# Patient Record
Sex: Male | Born: 1935 | Race: White | Hispanic: No | Smoking: Former smoker
Health system: Southern US, Community
[De-identification: ages and names within clinical notes are randomized; demographics above are authoritative.]

## PROBLEM LIST (undated history)

## (undated) DIAGNOSIS — I350 Nonrheumatic aortic (valve) stenosis: Secondary | ICD-10-CM

## (undated) DIAGNOSIS — L570 Actinic keratosis: Secondary | ICD-10-CM

## (undated) DIAGNOSIS — E785 Hyperlipidemia, unspecified: Secondary | ICD-10-CM

## (undated) DIAGNOSIS — K648 Other hemorrhoids: Secondary | ICD-10-CM

## (undated) DIAGNOSIS — I10 Essential (primary) hypertension: Secondary | ICD-10-CM

## (undated) DIAGNOSIS — I452 Bifascicular block: Secondary | ICD-10-CM

## (undated) DIAGNOSIS — R972 Elevated prostate specific antigen [PSA]: Secondary | ICD-10-CM

## (undated) DIAGNOSIS — N433 Hydrocele, unspecified: Secondary | ICD-10-CM

## (undated) DIAGNOSIS — R011 Cardiac murmur, unspecified: Secondary | ICD-10-CM

## (undated) DIAGNOSIS — H547 Unspecified visual loss: Secondary | ICD-10-CM

## (undated) DIAGNOSIS — Z87442 Personal history of urinary calculi: Secondary | ICD-10-CM

## (undated) DIAGNOSIS — N200 Calculus of kidney: Secondary | ICD-10-CM

## (undated) DIAGNOSIS — J3800 Paralysis of vocal cords and larynx, unspecified: Secondary | ICD-10-CM

## (undated) DIAGNOSIS — Z953 Presence of xenogenic heart valve: Secondary | ICD-10-CM

## (undated) DIAGNOSIS — I4819 Other persistent atrial fibrillation: Secondary | ICD-10-CM

## (undated) DIAGNOSIS — I2510 Atherosclerotic heart disease of native coronary artery without angina pectoris: Secondary | ICD-10-CM

## (undated) DIAGNOSIS — H3530 Unspecified macular degeneration: Secondary | ICD-10-CM

## (undated) DIAGNOSIS — M47812 Spondylosis without myelopathy or radiculopathy, cervical region: Secondary | ICD-10-CM

## (undated) DIAGNOSIS — I340 Nonrheumatic mitral (valve) insufficiency: Secondary | ICD-10-CM

## (undated) DIAGNOSIS — K219 Gastro-esophageal reflux disease without esophagitis: Secondary | ICD-10-CM

## (undated) DIAGNOSIS — Z9889 Other specified postprocedural states: Secondary | ICD-10-CM

## (undated) DIAGNOSIS — N183 Chronic kidney disease, stage 3 unspecified: Secondary | ICD-10-CM

## (undated) DIAGNOSIS — Z8719 Personal history of other diseases of the digestive system: Secondary | ICD-10-CM

## (undated) HISTORY — DX: Calculus of kidney: N20.0

## (undated) HISTORY — DX: Other specified postprocedural states: Z98.890

## (undated) HISTORY — DX: Spondylosis without myelopathy or radiculopathy, cervical region: M47.812

## (undated) HISTORY — PX: EYE SURGERY: SHX253

## (undated) HISTORY — DX: Hydrocele, unspecified: N43.3

## (undated) HISTORY — DX: Other persistent atrial fibrillation: I48.19

## (undated) HISTORY — DX: Nonrheumatic aortic (valve) stenosis: I35.0

## (undated) HISTORY — PX: HEMORRHOID SURGERY: SHX153

## (undated) HISTORY — DX: Cardiac murmur, unspecified: R01.1

## (undated) HISTORY — DX: Other hemorrhoids: K64.8

## (undated) HISTORY — DX: Chronic kidney disease, stage 3 unspecified: N18.30

## (undated) HISTORY — DX: Unspecified macular degeneration: H35.30

## (undated) HISTORY — DX: Hyperlipidemia, unspecified: E78.5

## (undated) HISTORY — DX: Bifascicular block: I45.2

## (undated) HISTORY — DX: Unspecified visual loss: H54.7

## (undated) HISTORY — DX: Elevated prostate specific antigen (PSA): R97.20

## (undated) HISTORY — DX: Actinic keratosis: L57.0

## (undated) HISTORY — DX: Essential (primary) hypertension: I10

## (undated) HISTORY — DX: Nonrheumatic mitral (valve) insufficiency: I34.0

## (undated) HISTORY — DX: Paralysis of vocal cords and larynx, unspecified: J38.00

---

## 1998-04-22 ENCOUNTER — Ambulatory Visit: Admission: RE | Admit: 1998-04-22 | Discharge: 1998-04-22 | Payer: Self-pay | Admitting: *Deleted

## 2003-11-22 ENCOUNTER — Ambulatory Visit (HOSPITAL_COMMUNITY): Admission: RE | Admit: 2003-11-22 | Discharge: 2003-11-22 | Payer: Self-pay | Admitting: Family Medicine

## 2004-04-28 ENCOUNTER — Emergency Department (HOSPITAL_COMMUNITY): Admission: EM | Admit: 2004-04-28 | Discharge: 2004-04-28 | Payer: Self-pay | Admitting: Emergency Medicine

## 2004-07-01 ENCOUNTER — Ambulatory Visit (HOSPITAL_COMMUNITY): Admission: RE | Admit: 2004-07-01 | Discharge: 2004-07-01 | Payer: Self-pay | Admitting: *Deleted

## 2004-09-15 DIAGNOSIS — Z9889 Other specified postprocedural states: Secondary | ICD-10-CM

## 2004-09-15 HISTORY — DX: Other specified postprocedural states: Z98.890

## 2005-08-06 ENCOUNTER — Ambulatory Visit (HOSPITAL_COMMUNITY): Admission: RE | Admit: 2005-08-06 | Discharge: 2005-08-06 | Payer: Self-pay | Admitting: Internal Medicine

## 2009-07-11 ENCOUNTER — Ambulatory Visit (HOSPITAL_COMMUNITY): Admission: RE | Admit: 2009-07-11 | Discharge: 2009-07-12 | Payer: Self-pay | Admitting: Ophthalmology

## 2009-10-12 HISTORY — PX: CATARACT EXTRACTION: SUR2

## 2009-11-24 ENCOUNTER — Emergency Department (HOSPITAL_COMMUNITY): Admission: EM | Admit: 2009-11-24 | Discharge: 2009-11-24 | Payer: Self-pay | Admitting: Emergency Medicine

## 2010-10-12 HISTORY — PX: GROIN MASS OPEN BIOPSY: SHX1714

## 2011-01-15 LAB — BASIC METABOLIC PANEL
BUN: 10 mg/dL (ref 6–23)
CO2: 28 mEq/L (ref 19–32)
Calcium: 9.3 mg/dL (ref 8.4–10.5)
Chloride: 102 mEq/L (ref 96–112)
Creatinine, Ser: 0.9 mg/dL (ref 0.4–1.5)
GFR calc Af Amer: >60 mL/min (ref >60)
GFR calc non Af Amer: >60 mL/min (ref >60)
Glucose, Bld: 110 mg/dL — ABNORMAL HIGH (ref 70–99)
Potassium: 4 mEq/L (ref 3.5–5.1)
Sodium: 139 mEq/L (ref 135–145)

## 2011-01-16 LAB — EYE CULTURE

## 2011-01-16 LAB — CBC
HCT: 44 % (ref 39.0–52.0)
Hemoglobin: 15 g/dL (ref 13.0–17.0)
MCHC: 34.1 g/dL (ref 30.0–36.0)
MCV: 90.6 fL (ref 78.0–100.0)
Platelets: 219 10*3/uL (ref 150–400)
RBC: 4.86 MIL/uL (ref 4.22–5.81)
RDW: 12.8 % (ref 11.5–15.5)
WBC: 11.2 10*3/uL — ABNORMAL HIGH (ref 4.0–10.5)

## 2011-01-16 LAB — BASIC METABOLIC PANEL
BUN: 10 mg/dL (ref 6–23)
CO2: 31 mEq/L (ref 19–32)
Calcium: 10 mg/dL (ref 8.4–10.5)
Chloride: 104 mEq/L (ref 96–112)
Creatinine, Ser: 0.93 mg/dL (ref 0.4–1.5)
GFR calc Af Amer: >60 mL/min (ref >60)
GFR calc non Af Amer: >60 mL/min (ref >60)
Glucose, Bld: 103 mg/dL — ABNORMAL HIGH (ref 70–99)
Potassium: 4.7 mEq/L (ref 3.5–5.1)
Sodium: 142 mEq/L (ref 135–145)

## 2011-01-16 LAB — GRAM STAIN

## 2011-02-24 ENCOUNTER — Inpatient Hospital Stay (HOSPITAL_COMMUNITY)
Admission: EM | Admit: 2011-02-24 | Discharge: 2011-02-25 | DRG: 287 | Disposition: A | Discharge status: Home / Self Care | Payer: Medicare Other | Attending: Interventional Cardiology | Admitting: Interventional Cardiology

## 2011-02-24 ENCOUNTER — Emergency Department (HOSPITAL_COMMUNITY): Payer: Medicare Other

## 2011-02-24 DIAGNOSIS — I10 Essential (primary) hypertension: Secondary | ICD-10-CM | POA: Diagnosis present

## 2011-02-24 DIAGNOSIS — Z79899 Other long term (current) drug therapy: Secondary | ICD-10-CM

## 2011-02-24 DIAGNOSIS — I2510 Atherosclerotic heart disease of native coronary artery without angina pectoris: Principal | ICD-10-CM | POA: Diagnosis present

## 2011-02-24 DIAGNOSIS — I059 Rheumatic mitral valve disease, unspecified: Secondary | ICD-10-CM | POA: Diagnosis present

## 2011-02-24 DIAGNOSIS — I200 Unstable angina: Secondary | ICD-10-CM | POA: Diagnosis present

## 2011-02-24 DIAGNOSIS — E785 Hyperlipidemia, unspecified: Secondary | ICD-10-CM | POA: Diagnosis present

## 2011-02-24 LAB — DIFFERENTIAL
Basophils Absolute: 0 10*3/uL (ref 0.0–0.1)
Basophils Relative: 0 % (ref 0–1)
Eosinophils Absolute: 0 10*3/uL (ref 0.0–0.7)
Eosinophils Relative: 0 % (ref 0–5)
Lymphocytes Relative: 15 % (ref 12–46)
Lymphs Abs: 1.5 10*3/uL (ref 0.7–4.0)
Monocytes Absolute: 0.6 10*3/uL (ref 0.1–1.0)
Monocytes Relative: 6 % (ref 3–12)
Neutro Abs: 7.7 10*3/uL (ref 1.7–7.7)
Neutrophils Relative %: 78 % — ABNORMAL HIGH (ref 43–77)

## 2011-02-24 LAB — URINALYSIS, ROUTINE W REFLEX MICROSCOPIC
Bilirubin Urine: NEGATIVE
Glucose, UA: NEGATIVE mg/dL
Hgb urine dipstick: NEGATIVE
Ketones, ur: NEGATIVE mg/dL
Nitrite: NEGATIVE
Protein, ur: NEGATIVE mg/dL
Specific Gravity, Urine: 1.01 (ref 1.005–1.030)
Urobilinogen, UA: 0.2 mg/dL (ref 0.0–1.0)
pH: 6 (ref 5.0–8.0)

## 2011-02-24 LAB — D-DIMER, QUANTITATIVE: D-Dimer, Quant: 0.29 ug/mL-FEU (ref 0.00–0.48)

## 2011-02-24 LAB — APTT: aPTT: 29 seconds (ref 24–37)

## 2011-02-24 LAB — CBC
HCT: 40.9 % (ref 39.0–52.0)
Hemoglobin: 13.6 g/dL (ref 13.0–17.0)
MCH: 29.4 pg (ref 26.0–34.0)
MCHC: 33.3 g/dL (ref 30.0–36.0)
MCV: 88.5 fL (ref 78.0–100.0)
Platelets: 207 10*3/uL (ref 150–400)
RBC: 4.62 MIL/uL (ref 4.22–5.81)
RDW: 13.2 % (ref 11.5–15.5)
WBC: 9.8 10*3/uL (ref 4.0–10.5)

## 2011-02-24 LAB — BASIC METABOLIC PANEL
BUN: 18 mg/dL (ref 6–23)
CO2: 26 mEq/L (ref 19–32)
Calcium: 10.2 mg/dL (ref 8.4–10.5)
Chloride: 104 mEq/L (ref 96–112)
Creatinine, Ser: 1.32 mg/dL (ref 0.4–1.5)
GFR calc Af Amer: >60 mL/min (ref >60)
GFR calc non Af Amer: 53 mL/min — ABNORMAL LOW (ref >60)
Glucose, Bld: 83 mg/dL (ref 70–99)
Potassium: 5.9 mEq/L — ABNORMAL HIGH (ref 3.5–5.1)
Sodium: 138 mEq/L (ref 135–145)

## 2011-02-24 LAB — PROTIME-INR
INR: 1 (ref 0.00–1.49)
Prothrombin Time: 13.4 seconds (ref 11.6–15.2)

## 2011-02-24 LAB — CK TOTAL AND CKMB (NOT AT ARMC)
CK, MB: 6.4 ng/mL (ref 0.3–4.0)
Relative Index: 3.7 — ABNORMAL HIGH (ref 0.0–2.5)
Total CK: 174 U/L (ref 7–232)

## 2011-02-24 LAB — TROPONIN I: Troponin I: <0.3 ng/mL (ref <0.30)

## 2011-02-24 LAB — POTASSIUM: Potassium: 4.4 mEq/L (ref 3.5–5.1)

## 2011-02-24 LAB — PRO B NATRIURETIC PEPTIDE: Pro B Natriuretic peptide (BNP): 3344 pg/mL — ABNORMAL HIGH (ref 0–450)

## 2011-02-25 DIAGNOSIS — I340 Nonrheumatic mitral (valve) insufficiency: Secondary | ICD-10-CM

## 2011-02-25 HISTORY — DX: Nonrheumatic mitral (valve) insufficiency: I34.0

## 2011-02-25 LAB — BASIC METABOLIC PANEL
BUN: 19 mg/dL (ref 6–23)
CO2: 29 mEq/L (ref 19–32)
Calcium: 9.6 mg/dL (ref 8.4–10.5)
Chloride: 105 mEq/L (ref 96–112)
Creatinine, Ser: 1.26 mg/dL (ref 0.4–1.5)
GFR calc Af Amer: >60 mL/min (ref >60)
GFR calc non Af Amer: 56 mL/min — ABNORMAL LOW (ref >60)
Glucose, Bld: 112 mg/dL — ABNORMAL HIGH (ref 70–99)
Potassium: 4.1 mEq/L (ref 3.5–5.1)
Sodium: 141 mEq/L (ref 135–145)

## 2011-02-25 LAB — CARDIAC PANEL(CRET KIN+CKTOT+MB+TROPI)
CK, MB: 5.7 ng/mL — ABNORMAL HIGH (ref 0.3–4.0)
Relative Index: 5 — ABNORMAL HIGH (ref 0.0–2.5)
Total CK: 115 U/L (ref 7–232)

## 2011-02-27 NOTE — Cardiovascular Report (Signed)
NAMEJAHMIR, SALO                ACCOUNT NO.:  [id number]   MEDICAL RECORD NO.:  [id number]          PATIENT TYPE:  OIB   LOCATION:  2899                         FACILITY:  [facility]   PHYSICIAN:  Meade Maw, M.D.    DATE OF BIRTH:  [birth year]   DATE OF PROCEDURE:  07/01/2004  DATE OF DISCHARGE:                              CARDIAC CATHETERIZATION   INDICATION FOR PROCEDURE:  Evaluation of mitral regurgitation.   PROCEDURE:  After obtaining written informed consent, the patient was  brought to the cardiac catheterization lab in a postabsorptive state.  Preop  sedation was achieved using IV Versed.  The right groin was prepped and  draped in the usual sterile fashion.  Local anesthesia was achieved using 1%  Xylocaine.  A 6 French hemostasis sheath was placed into the right femoral  artery using a modified Seldinger technique.  An 8 [place] hemostasis sheath  was placed into the right femoral vein using a modified Seldinger technique.  Right heart catheterization pressures were obtained.  Thermodilution on  cardiac outputs were obtained.  Selective coronary angiography was performed  using a JL-4, JR-4 Judkins catheter.  Single plane ventriculogram was  performed in the RAO position using a 6 French pigtail curved catheter.  O2  saturations were obtained from the SVC, the RA and pulmonary artery.   FINDINGS:  1.  Aortic pressure was 118/70.  2.  LV pressure 123/5.  3.  There was no gradient noted on pullback.  4.  The RA pressure was 90.  RV pressure was 38/3.  The pulmonary wedge      pressure mean was 13.  The thermodilution on cardiac output was 5.70.      The thermodilution on cardiac index was 2.92.  Cardiac output by Fick      was 4.30.  The cardiac index by Fick of 2.21.  The O2 saturation from      the right atrium was 70%.  The O2 saturation from the right ventricle      was 70%.  The aortic saturation was 97%.  5.  Single plane ventriculogram revealed the left ventricle  which was at      upper limits of normal.  The estimated ejection fraction was 65%.  The      was mitral regurgitation.  There was moderately severe mitral      regurgitation with left atrial opacity at approximately the third beat.      There was marked left atrial enlargement.   CORONARY ANGIOGRAPHY:  1.  The left main coronary artery bifurcates into the left anterior      descending and circumflex vessel.  There was mild calcification in the      left main and proximal LAD.  There was no significant disease in the      left main coronary artery.  2.  LAD:  Left anterior descending gives rise to two large diagonals and      goes on in as an apical recurrent branch.  There is no significant      disease in the left anterior descending or  its branches.  3.  Circumflex vessel.  The circumflex vessel is a moderate size vessel      giving size to moderate OM-1, OM-2, ending as an AV groove vessel.      There is no disease noted in the circumflex or its branches.  4.  Right coronary artery:  Right coronary artery is dominant for the      posterior circulation.  There is luminal irregularities noted in the      proximal right coronary artery.  There was a brief run of paroxysmal      atrial fibrillation persisting for approximately 15 seconds during the      procedure.   FINAL IMPRESSION:  1.  Moderately severe mitral regurgitation with marked left atrial      enlargement.  2.  Short run of paroxysmal atrial fibrillation.  3.  Normal pulmonary artery pressure.  4.  Luminal irregularities in the right coronary artery only.   RECOMMENDATIONS:  The films will be reviewed by Dr. Dorris Fetch.  At this  time, may continue to monitor the mitral regurgitation.       HP/MEDQ  D:  07/01/2004  T:  07/01/2004  Job:  [id number]   cc:   Holley Bouche, M.D.  [address]  [place], [place] [address]  Fax: [phone]

## 2011-03-04 NOTE — Discharge Summary (Signed)
  [name], [name]                ACCOUNT NO.:  [id number]  MEDICAL RECORD NO.:  [id number]           PATIENT TYPE:  I  LOCATION:  6522                         FACILITY:  [facility]  PHYSICIAN:  Corky Crafts, MDDATE OF BIRTH:  Apr 09, 1932  DATE OF ADMISSION:  02/24/2011 DATE OF DISCHARGE:  02/25/2011                              DISCHARGE SUMMARY   FINAL DIAGNOSES: 1. Unstable angina, resolved. 2. Mitral regurgitation, likely causing elevated BNP. 3. Hyperlipidemia. 4. Hypertension.  PROCEDURES PERFORMED:  Cardiac catheterization showing no significant coronary artery disease.  There was a 20-mm gradient across the aortic valve on pull back.  There is moderate mitral regurgitation.  HOSPITAL COURSE:  The patient was admitted after having unstable angina symptoms over the past few months.  He underwent cardiac catheterization, showing no significant coronary artery disease.  The findings are as outlined above.  He tolerated the procedure well.  He ruled out for MI.  His blood pressure was stable and overall he felt well.  His BNP was elevated, but he did not have significant shortness of breath.  He did not require oxygen.  DISCHARGE MEDICATIONS: 1. Aspirin 325 mg daily. 2. Lipitor 20 mg daily. 3. Lisinopril 20 mg daily. 4. PreserVision. 5. Several other herbal medicines as well.  FOLLOWUP APPOINTMENTS:  With Corky Crafts, MD on Mar 02, 2011, at 3:15 p.m.  DIET:  Low-sodium, heart-healthy diet.  ACTIVITY:  Increase activity slowly.  Follow up post-radial cath instructions.  No golf until he sees me.  We will likely start Lasix at the time of his next visit.     Corky Crafts, MD    JSV/MEDQ  D:  02/25/2011  T:  02/26/2011  Job:  [id number]  Electronically Signed by Lance Muss MD on 03/04/2011 05:08:23 PM

## 2011-03-17 NOTE — Cardiovascular Report (Signed)
Jason [name], Jason [name]                ACCOUNT NO.:  [id number]  MEDICAL RECORD NO.:  [id number]           PATIENT TYPE:  I  LOCATION:  6522                         FACILITY:  [facility]  PHYSICIAN:  Corky Crafts, MDDATE OF BIRTH:  1931-11-30  DATE OF PROCEDURE:  02/25/2011 DATE OF DISCHARGE:  02/25/2011                           CARDIAC CATHETERIZATION   PROCEDURES PERFORMED: 1. Left heart catheterization. 2. Left ventriculogram. 3. Coronary angiogram.  OPERATOR:  Corky Crafts, MD  INDICATION:  Unstable angina.  PROCEDURE NOTE:  The risks and benefits of cardiac cath were explained to the patient and informed consent was obtained.  He was brought to the cath lab.  He was prepped and draped in usual sterile fashion.  His right wrist was infiltrated with 1% lidocaine.  A 5-French glide sheath was placed into right radial artery using modified Seldinger technique. Right coronary artery angiography was performed using JR-4 pigtail catheter.  The catheter was advanced in to the vessel ostium under fluoroscopic guidance.  Digital angiography was performed in multiple projections using hand injection of contrast.  Left coronary artery angiography was performed using JL-3.5 catheter in similar fashion and pigtail catheter was advanced to the ascending aorta and across the aortic valve under fluoroscopic guidance.  Power injection of contrast was performed in the RAO projection to image the left ventricle. Catheter was pulled back under continuous hemodynamic pressure monitoring.  The sheath was removed and a TR band was placed for hemostasis.  FINDINGS:  The right coronary artery is a large dominant vessel.  There is mild-to-moderate diffuse disease.  Posterolateral artery and posterior descending arteries were small, but both patent.  There is an early bifurcation in the mid RCA. The left main is widely patent. The left circumflex is a large vessel.  The OM-1 is large  vessel and appears widely patent.  There is a mild calcification noted throughout the left circumflex. The left anterior descending is a large vessel which wraps around the apex.  There are mild luminal irregularities.  The first diagonal is a medium vessel with an ostial 50% stenosis.  Second diagonal is a large vessel.  There are mild calcifications throughout the LAD system. The left ventriculogram shows normal left ventricular function with ejection fraction of 60%.  There is 3+ mitral regurgitation.  Of note, there was some difficulty in crossing the aortic valve.  HEMODYNAMICS:  Left ventricular pressure 138/13 with LVEDP of 20 mmHg. Aortic pressure 120/76 with a mean aortic pressure of 94 mmHg, proximally 20-mm gradient on pullback, an AL-1 catheter with a straight wire was used to cross the aortic valve.  IMPRESSION: 1. Mild coronary artery disease. 2. Mild-to-moderate aortic valve gradient. 3. A 3+ mitral regurgitation. 4. Mildly increased left ventricular end-diastolic pressure.  RECOMMENDATIONS:  Continue medical therapy.  We will follow his mild aortic stenosis by echo.  We will also diurese, he has had a few days after the cath given his increased BNP.  We will likely discharge the patient later today.     Corky Crafts, MD     JSV/MEDQ  D:  02/25/2011  T:  02/26/2011  Job:  [id number]  cc:   Jillyn Hidden A. Rankin, M.D. [employer]  Electronically Signed by Lance Muss MD on 03/17/2011 10:11:51 AM

## 2011-07-27 ENCOUNTER — Encounter (INDEPENDENT_AMBULATORY_CARE_PROVIDER_SITE_OTHER): Payer: Self-pay | Admitting: Surgery

## 2011-07-27 ENCOUNTER — Ambulatory Visit (INDEPENDENT_AMBULATORY_CARE_PROVIDER_SITE_OTHER): Payer: Medicare Other | Admitting: Surgery

## 2011-07-27 VITALS — BP 129/78 | HR 79 | Temp 98.6°F | Resp 14 | Ht 69.0 in | Wt 172.8 lb

## 2011-07-27 DIAGNOSIS — R1909 Other intra-abdominal and pelvic swelling, mass and lump: Secondary | ICD-10-CM

## 2011-07-27 DIAGNOSIS — R1900 Intra-abdominal and pelvic swelling, mass and lump, unspecified site: Secondary | ICD-10-CM

## 2011-07-27 NOTE — Progress Notes (Signed)
Chief Complaint  Patient presents with  . Hernia    Jason [name] is a 77 y.o. male.   Jason This gentleman is referred by Dr. Holley Bouche for evaluation of a left groin mass and possible hernia. He reports he noticed it approximately 3 weeks ago. It is nontender. He has no pain. He denies any obstructive symptoms. He is otherwise without complaints. Past Medical History  Diagnosis Date  . Hyperlipidemia   . Hypertension   . Heart murmur     History reviewed. No pertinent past surgical history.  Family History  Problem Relation Age of Onset  . Cancer Father     Social History History  Substance Use Topics  . Smoking status: Never Smoker   . Smokeless tobacco: Not on file  . Alcohol Use: No    Allergies  Allergen Reactions  . Penicillins Rash    Current Outpatient Prescriptions  Medication Sig Dispense Refill  . atorvastatin (LIPITOR) 20 MG tablet       . CARTIA XT 240 MG 24 hr capsule       . doxycycline (VIBRAMYCIN) 50 MG capsule       . furosemide (LASIX) 40 MG tablet       . lisinopril (PRINIVIL,ZESTRIL) 20 MG tablet Take 20 mg by mouth daily.        Marland Kitchen warfarin (COUMADIN) 5 MG tablet         Review of Systems Review of Systems  Constitutional: Negative.   HENT: Negative.   Eyes: Negative.   Respiratory: Negative.   Cardiovascular: Negative.   Gastrointestinal: Negative.   Genitourinary: Positive for scrotal swelling.  Musculoskeletal: Negative.   Neurological: Negative.   Hematological: Negative.   Psychiatric/Behavioral: Negative.     Blood pressure 129/78, pulse 79, temperature 98.6 F (37 C), temperature source Temporal, resp. rate 14, height 5\' 9"  (1.753 m), weight 172 lb 12.8 oz (78.382 kg).  Physical Exam Physical Exam  Constitutional: He is oriented to person, place, and time. He appears well-developed and well-nourished. No distress.  HENT:  Head: Normocephalic and atraumatic.  Right Ear: External ear normal.  Left Ear: External  ear normal.  Nose: Nose normal.  Mouth/Throat: Oropharynx is clear and moist. No oropharyngeal exudate.  Eyes: Conjunctivae are normal. Pupils are equal, round, and reactive to light. Left eye exhibits no discharge. No scleral icterus.  Neck: Normal range of motion. Neck supple. No tracheal deviation present. No thyromegaly present.  Cardiovascular: Normal rate.  An irregular rhythm present.  Murmur heard. Pulmonary/Chest: Effort normal and breath sounds normal. No respiratory distress. He has no wheezes. He has no rales.  Abdominal: Soft. Bowel sounds are normal. He exhibits no distension. There is no tenderness. There is no rebound.  Musculoskeletal: Normal range of motion.  Lymphadenopathy:    He has no cervical adenopathy.  Neurological: He is alert and oriented to person, place, and time.  Skin: Skin is warm and dry. No rash noted. No erythema.  Psychiatric: He has a normal mood and affect. His behavior is normal. Judgment and thought content normal.   On examination of his groins, there is a 2-3 cm soft mobile mass in the left inguinal area. It is not reducible. I do not feel a specific left inguinal hernia or femoral hernia. There is no evidence of right inguinal hernia. The patient has testicles distended bilaterally. There is a right-sided hydrocele Data Reviewed   Assessment    Patient with a left groin mass of uncertain etiology.  I suspect this is either lymphadenopathy or a lipoma. I doubt hernia but I cannot totally ruled out.    Plan    At this point we discussed conservative management a CAT scan of the pelvis to a better elicit the etiology first groin exploration. After a long discussion we decided proceed with groin exploration. I discussed the risks of surgery with him including the risk of bleeding, infection, injury to surrounding structures, nerve entrapment, chronic pain, use of mesh should hernia be present, et [name]. He will stop his Coumadin 5 days prior to  surgery. The chance of success is reasonable with the surgery       Joslynn Jamroz A 07/27/2011, 2:49 PM

## 2011-08-10 ENCOUNTER — Encounter (HOSPITAL_COMMUNITY)
Admission: RE | Admit: 2011-08-10 | Discharge: 2011-08-10 | Disposition: A | Discharge status: Home / Self Care | Payer: Medicare Other | Source: Ambulatory Visit | Attending: Surgery | Admitting: Surgery

## 2011-08-10 ENCOUNTER — Ambulatory Visit (HOSPITAL_COMMUNITY)
Admission: RE | Admit: 2011-08-10 | Discharge: 2011-08-10 | Disposition: A | Discharge status: Home / Self Care | Payer: Medicare Other | Source: Ambulatory Visit | Attending: Surgery | Admitting: Surgery

## 2011-08-10 ENCOUNTER — Other Ambulatory Visit (INDEPENDENT_AMBULATORY_CARE_PROVIDER_SITE_OTHER): Payer: Self-pay | Admitting: Surgery

## 2011-08-10 DIAGNOSIS — Z01818 Encounter for other preprocedural examination: Secondary | ICD-10-CM | POA: Insufficient documentation

## 2011-08-10 DIAGNOSIS — R1909 Other intra-abdominal and pelvic swelling, mass and lump: Secondary | ICD-10-CM

## 2011-08-10 DIAGNOSIS — Z01812 Encounter for preprocedural laboratory examination: Secondary | ICD-10-CM | POA: Insufficient documentation

## 2011-08-10 LAB — BASIC METABOLIC PANEL
BUN: 13 mg/dL (ref 6–23)
CO2: 30 mEq/L (ref 19–32)
Calcium: 10.3 mg/dL (ref 8.4–10.5)
Chloride: 104 mEq/L (ref 96–112)
Creatinine, Ser: 1.05 mg/dL (ref 0.50–1.35)
GFR calc Af Amer: 78 mL/min — ABNORMAL LOW (ref >90)
GFR calc non Af Amer: 67 mL/min — ABNORMAL LOW (ref >90)
Glucose, Bld: 89 mg/dL (ref 70–99)
Potassium: 4.7 mEq/L (ref 3.5–5.1)
Sodium: 141 mEq/L (ref 135–145)

## 2011-08-10 LAB — PROTIME-INR
INR: 1.92 — ABNORMAL HIGH (ref 0.00–1.49)
Prothrombin Time: 22.3 seconds — ABNORMAL HIGH (ref 11.6–15.2)

## 2011-08-10 LAB — APTT: aPTT: 33 seconds (ref 24–37)

## 2011-08-10 LAB — CBC
HCT: 43 % (ref 39.0–52.0)
Hemoglobin: 14.6 g/dL (ref 13.0–17.0)
MCH: 29.9 pg (ref 26.0–34.0)
MCHC: 34 g/dL (ref 30.0–36.0)
MCV: 87.9 fL (ref 78.0–100.0)
Platelets: 215 10*3/uL (ref 150–400)
RBC: 4.89 MIL/uL (ref 4.22–5.81)
RDW: 13.6 % (ref 11.5–15.5)
WBC: 6.6 10*3/uL (ref 4.0–10.5)

## 2011-08-10 LAB — SURGICAL PCR SCREEN
MRSA, PCR: NEGATIVE
Staphylococcus aureus: NEGATIVE

## 2011-08-13 HISTORY — PX: INGUINAL HERNIA REPAIR: SUR1180

## 2011-08-14 ENCOUNTER — Ambulatory Visit (HOSPITAL_COMMUNITY)
Admission: RE | Admit: 2011-08-14 | Discharge: 2011-08-14 | Disposition: A | Discharge status: Home / Self Care | Payer: Medicare Other | Source: Ambulatory Visit | Attending: Surgery | Admitting: Surgery

## 2011-08-14 ENCOUNTER — Other Ambulatory Visit (INDEPENDENT_AMBULATORY_CARE_PROVIDER_SITE_OTHER): Payer: Self-pay | Admitting: Surgery

## 2011-08-14 DIAGNOSIS — Z01818 Encounter for other preprocedural examination: Secondary | ICD-10-CM | POA: Insufficient documentation

## 2011-08-14 DIAGNOSIS — K4190 Unilateral femoral hernia, without obstruction or gangrene, not specified as recurrent: Secondary | ICD-10-CM

## 2011-08-14 DIAGNOSIS — Z01812 Encounter for preprocedural laboratory examination: Secondary | ICD-10-CM | POA: Insufficient documentation

## 2011-08-14 LAB — PROTIME-INR
INR: 1.1 (ref 0.00–1.49)
Prothrombin Time: 14.4 seconds (ref 11.6–15.2)

## 2011-08-19 NOTE — Op Note (Signed)
  [name], Jason [name]                ACCOUNT NO.:  [id number]  MEDICAL RECORD NO.:  [id number]  LOCATION:  SDSC                         FACILITY:  [facility]  PHYSICIAN:  Abigail Miyamoto, M.D. DATE OF BIRTH:  [birth year]  DATE OF PROCEDURE:  08/14/2011 DATE OF DISCHARGE:                              OPERATIVE REPORT   PREOPERATIVE DIAGNOSIS:  Left groin mass.  POSTOPERATIVE DIAGNOSIS:  Left femoral hernia.  PROCEDURE:  Left groin exploration with left femoral hernia repair with mesh.  SURGEONS:  Abigail Miyamoto, MD  ANESTHESIA:  General and 0.5% Marcaine.  ESTIMATED BLOOD LOSS:  Minimal.  INDICATIONS:  This is a 77 year old gentleman who presents with a soft palpable nontender mass in his left groin.  Decision was made to proceed with exploration of the groin.  FINDINGS:  The patient was found to have a left femoral hernia.  The defect was quite small.  There was a large sac containing a small amount of omentum and fluid.  This was repaired with a Bard, Prolene plug.  PROCEDURE IN DETAIL:  The patient was brought to the operating room identified as Jason [name].  He was placed supine on the operating room table and general anesthesia was induced.  His left abdomen and groin were then prepped and draped in usual sterile fashion.  I made a small longitudinal incision in the left groin just below the inguinal ligament over the palpable mass.  I took this down through subcutaneous tissue with electrocautery.  The mass was then identified circumferentially and found to be consistent with a femoral hernia.  I excised the sac, which was quite large and thickened.  It was found to contain fluid and a small amount of omentum.  This omentum was easily reduced back to the hernia defect into the abdominal cavity.  I then excised the redundant sac and sent to pathology for evaluation.  The actual inguinal floor itself appeared to be intact and the small fascial defect at the  femoral hernia was approximately 6-7 mm in size.  I brought a small Bard, Prolene plug onto the field and placed it into the fascial defect.  I then secured it in place with several interrupted 2-0 Vicryl sutures. Again, good coverage of the femoral hernia defect appeared to be achieved.  At this point, I anesthetized the area circumferentially with Marcaine.  I then closed the subcutaneous tissue with interrupted 3-0 Vicryl sutures and closed the skin with running 4-0 Monocryl.  Steri-Strips, gauze, and tape were then applied.  The patient tolerated the procedure well.  All counts were correct at the end of the procedure.  The patient was then extubated in the operating room and taken in stable condition to recovery room.     Abigail Miyamoto, M.D.     DB/MEDQ  D:  08/14/2011  T:  08/14/2011  Job:  [id number]  Electronically Signed by Abigail Miyamoto M.D. on 08/19/2011 09:47:41 AM

## 2011-08-24 ENCOUNTER — Encounter (INDEPENDENT_AMBULATORY_CARE_PROVIDER_SITE_OTHER): Payer: Self-pay | Admitting: Surgery

## 2011-09-07 ENCOUNTER — Ambulatory Visit (INDEPENDENT_AMBULATORY_CARE_PROVIDER_SITE_OTHER): Payer: Medicare Other | Admitting: Surgery

## 2011-09-07 ENCOUNTER — Encounter (INDEPENDENT_AMBULATORY_CARE_PROVIDER_SITE_OTHER): Payer: Self-pay | Admitting: Surgery

## 2011-09-07 VITALS — BP 118/82 | HR 68 | Temp 97.6°F | Resp 16 | Ht 69.0 in | Wt 172.6 lb

## 2011-09-07 DIAGNOSIS — K4190 Unilateral femoral hernia, without obstruction or gangrene, not specified as recurrent: Secondary | ICD-10-CM | POA: Insufficient documentation

## 2011-09-07 DIAGNOSIS — Z09 Encounter for follow-up examination after completed treatment for conditions other than malignant neoplasm: Secondary | ICD-10-CM

## 2011-09-07 NOTE — Progress Notes (Signed)
Subjective:     Patient ID: Jason [name], male   DOB: 1932-04-09, 77 y.o.   MRN: [id number]  HPI He is here for his first postoperative visit status post left femoral hernia repair with mesh. Originally this is all be a mass that was found at the time of surgery to be a hernia. He is doing well and has no complaints.  Review of Systems     Objective:   Physical Exam On exam, his incision is healing well with no evidence of recurrent    Assessment:     Patient status post left femoral hernia repair with mesh    Plan:     He may now returned to normal activity. I will see him back as needed

## 2011-09-16 HISTORY — PX: MITRAL VALVE REPAIR: SHX2039

## 2011-10-26 DIAGNOSIS — Z7901 Long term (current) use of anticoagulants: Secondary | ICD-10-CM | POA: Diagnosis not present

## 2011-10-26 DIAGNOSIS — I4891 Unspecified atrial fibrillation: Secondary | ICD-10-CM | POA: Diagnosis not present

## 2011-11-05 DIAGNOSIS — H35059 Retinal neovascularization, unspecified, unspecified eye: Secondary | ICD-10-CM | POA: Diagnosis not present

## 2011-11-05 DIAGNOSIS — H353290 Exudative age-related macular degeneration, unspecified eye, stage unspecified: Secondary | ICD-10-CM | POA: Diagnosis not present

## 2011-11-23 DIAGNOSIS — Z7901 Long term (current) use of anticoagulants: Secondary | ICD-10-CM | POA: Diagnosis not present

## 2011-11-23 DIAGNOSIS — I4891 Unspecified atrial fibrillation: Secondary | ICD-10-CM | POA: Diagnosis not present

## 2011-12-07 DIAGNOSIS — I4891 Unspecified atrial fibrillation: Secondary | ICD-10-CM | POA: Diagnosis not present

## 2011-12-07 DIAGNOSIS — Z7901 Long term (current) use of anticoagulants: Secondary | ICD-10-CM | POA: Diagnosis not present

## 2011-12-09 DIAGNOSIS — H353290 Exudative age-related macular degeneration, unspecified eye, stage unspecified: Secondary | ICD-10-CM | POA: Diagnosis not present

## 2011-12-09 DIAGNOSIS — H35059 Retinal neovascularization, unspecified, unspecified eye: Secondary | ICD-10-CM | POA: Diagnosis not present

## 2011-12-28 DIAGNOSIS — H35359 Cystoid macular degeneration, unspecified eye: Secondary | ICD-10-CM | POA: Diagnosis not present

## 2011-12-28 DIAGNOSIS — H3560 Retinal hemorrhage, unspecified eye: Secondary | ICD-10-CM | POA: Diagnosis not present

## 2011-12-28 DIAGNOSIS — H353290 Exudative age-related macular degeneration, unspecified eye, stage unspecified: Secondary | ICD-10-CM | POA: Diagnosis not present

## 2011-12-28 DIAGNOSIS — H35059 Retinal neovascularization, unspecified, unspecified eye: Secondary | ICD-10-CM | POA: Diagnosis not present

## 2011-12-30 DIAGNOSIS — H353290 Exudative age-related macular degeneration, unspecified eye, stage unspecified: Secondary | ICD-10-CM | POA: Diagnosis not present

## 2011-12-30 DIAGNOSIS — H35059 Retinal neovascularization, unspecified, unspecified eye: Secondary | ICD-10-CM | POA: Diagnosis not present

## 2012-01-04 DIAGNOSIS — I4891 Unspecified atrial fibrillation: Secondary | ICD-10-CM | POA: Diagnosis not present

## 2012-01-04 DIAGNOSIS — Z7901 Long term (current) use of anticoagulants: Secondary | ICD-10-CM | POA: Diagnosis not present

## 2012-01-13 DIAGNOSIS — H353290 Exudative age-related macular degeneration, unspecified eye, stage unspecified: Secondary | ICD-10-CM | POA: Diagnosis not present

## 2012-01-13 DIAGNOSIS — H35059 Retinal neovascularization, unspecified, unspecified eye: Secondary | ICD-10-CM | POA: Diagnosis not present

## 2012-02-01 DIAGNOSIS — I4891 Unspecified atrial fibrillation: Secondary | ICD-10-CM | POA: Diagnosis not present

## 2012-02-01 DIAGNOSIS — I059 Rheumatic mitral valve disease, unspecified: Secondary | ICD-10-CM | POA: Diagnosis not present

## 2012-02-01 DIAGNOSIS — Z7901 Long term (current) use of anticoagulants: Secondary | ICD-10-CM | POA: Diagnosis not present

## 2012-02-10 DIAGNOSIS — I059 Rheumatic mitral valve disease, unspecified: Secondary | ICD-10-CM | POA: Diagnosis not present

## 2012-02-15 DIAGNOSIS — I10 Essential (primary) hypertension: Secondary | ICD-10-CM | POA: Diagnosis not present

## 2012-02-15 DIAGNOSIS — I4891 Unspecified atrial fibrillation: Secondary | ICD-10-CM | POA: Diagnosis not present

## 2012-02-15 DIAGNOSIS — E785 Hyperlipidemia, unspecified: Secondary | ICD-10-CM | POA: Diagnosis not present

## 2012-02-15 DIAGNOSIS — M1990 Unspecified osteoarthritis, unspecified site: Secondary | ICD-10-CM | POA: Diagnosis not present

## 2012-02-17 DIAGNOSIS — H35359 Cystoid macular degeneration, unspecified eye: Secondary | ICD-10-CM | POA: Diagnosis not present

## 2012-02-17 DIAGNOSIS — H35059 Retinal neovascularization, unspecified, unspecified eye: Secondary | ICD-10-CM | POA: Diagnosis not present

## 2012-02-17 DIAGNOSIS — H353290 Exudative age-related macular degeneration, unspecified eye, stage unspecified: Secondary | ICD-10-CM | POA: Diagnosis not present

## 2012-03-21 DIAGNOSIS — I4891 Unspecified atrial fibrillation: Secondary | ICD-10-CM | POA: Diagnosis not present

## 2012-03-21 DIAGNOSIS — Z7901 Long term (current) use of anticoagulants: Secondary | ICD-10-CM | POA: Diagnosis not present

## 2012-03-24 DIAGNOSIS — H35059 Retinal neovascularization, unspecified, unspecified eye: Secondary | ICD-10-CM | POA: Diagnosis not present

## 2012-03-24 DIAGNOSIS — H353290 Exudative age-related macular degeneration, unspecified eye, stage unspecified: Secondary | ICD-10-CM | POA: Diagnosis not present

## 2012-04-07 ENCOUNTER — Other Ambulatory Visit: Payer: Self-pay | Admitting: Dermatology

## 2012-04-07 DIAGNOSIS — C44721 Squamous cell carcinoma of skin of unspecified lower limb, including hip: Secondary | ICD-10-CM | POA: Diagnosis not present

## 2012-04-07 DIAGNOSIS — C44711 Basal cell carcinoma of skin of unspecified lower limb, including hip: Secondary | ICD-10-CM | POA: Diagnosis not present

## 2012-04-07 DIAGNOSIS — C44611 Basal cell carcinoma of skin of unspecified upper limb, including shoulder: Secondary | ICD-10-CM | POA: Diagnosis not present

## 2012-04-07 DIAGNOSIS — D235 Other benign neoplasm of skin of trunk: Secondary | ICD-10-CM | POA: Diagnosis not present

## 2012-04-07 DIAGNOSIS — L819 Disorder of pigmentation, unspecified: Secondary | ICD-10-CM | POA: Diagnosis not present

## 2012-04-27 DIAGNOSIS — H353290 Exudative age-related macular degeneration, unspecified eye, stage unspecified: Secondary | ICD-10-CM | POA: Diagnosis not present

## 2012-04-27 DIAGNOSIS — H35059 Retinal neovascularization, unspecified, unspecified eye: Secondary | ICD-10-CM | POA: Diagnosis not present

## 2012-05-09 DIAGNOSIS — I4891 Unspecified atrial fibrillation: Secondary | ICD-10-CM | POA: Diagnosis not present

## 2012-05-09 DIAGNOSIS — Z7901 Long term (current) use of anticoagulants: Secondary | ICD-10-CM | POA: Diagnosis not present

## 2012-05-23 DIAGNOSIS — Z7901 Long term (current) use of anticoagulants: Secondary | ICD-10-CM | POA: Diagnosis not present

## 2012-05-23 DIAGNOSIS — I4891 Unspecified atrial fibrillation: Secondary | ICD-10-CM | POA: Diagnosis not present

## 2012-05-25 ENCOUNTER — Other Ambulatory Visit: Payer: Self-pay | Admitting: Dermatology

## 2012-05-25 DIAGNOSIS — C44621 Squamous cell carcinoma of skin of unspecified upper limb, including shoulder: Secondary | ICD-10-CM | POA: Diagnosis not present

## 2012-05-25 DIAGNOSIS — Z85828 Personal history of other malignant neoplasm of skin: Secondary | ICD-10-CM | POA: Diagnosis not present

## 2012-06-01 DIAGNOSIS — H353290 Exudative age-related macular degeneration, unspecified eye, stage unspecified: Secondary | ICD-10-CM | POA: Diagnosis not present

## 2012-06-01 DIAGNOSIS — H35059 Retinal neovascularization, unspecified, unspecified eye: Secondary | ICD-10-CM | POA: Diagnosis not present

## 2012-06-20 DIAGNOSIS — Z7901 Long term (current) use of anticoagulants: Secondary | ICD-10-CM | POA: Diagnosis not present

## 2012-06-20 DIAGNOSIS — I4891 Unspecified atrial fibrillation: Secondary | ICD-10-CM | POA: Diagnosis not present

## 2012-07-06 DIAGNOSIS — H35059 Retinal neovascularization, unspecified, unspecified eye: Secondary | ICD-10-CM | POA: Diagnosis not present

## 2012-07-06 DIAGNOSIS — H353290 Exudative age-related macular degeneration, unspecified eye, stage unspecified: Secondary | ICD-10-CM | POA: Diagnosis not present

## 2012-07-18 DIAGNOSIS — Z7901 Long term (current) use of anticoagulants: Secondary | ICD-10-CM | POA: Diagnosis not present

## 2012-07-18 DIAGNOSIS — I4891 Unspecified atrial fibrillation: Secondary | ICD-10-CM | POA: Diagnosis not present

## 2012-07-20 DIAGNOSIS — N433 Hydrocele, unspecified: Secondary | ICD-10-CM | POA: Diagnosis not present

## 2012-07-20 DIAGNOSIS — N401 Enlarged prostate with lower urinary tract symptoms: Secondary | ICD-10-CM | POA: Diagnosis not present

## 2012-08-10 DIAGNOSIS — H353290 Exudative age-related macular degeneration, unspecified eye, stage unspecified: Secondary | ICD-10-CM | POA: Diagnosis not present

## 2012-08-24 DIAGNOSIS — L570 Actinic keratosis: Secondary | ICD-10-CM | POA: Diagnosis not present

## 2012-08-24 DIAGNOSIS — Z85828 Personal history of other malignant neoplasm of skin: Secondary | ICD-10-CM | POA: Diagnosis not present

## 2012-08-25 DIAGNOSIS — Z0000 Encounter for general adult medical examination without abnormal findings: Secondary | ICD-10-CM | POA: Diagnosis not present

## 2012-08-25 DIAGNOSIS — I10 Essential (primary) hypertension: Secondary | ICD-10-CM | POA: Diagnosis not present

## 2012-08-25 DIAGNOSIS — Z23 Encounter for immunization: Secondary | ICD-10-CM | POA: Diagnosis not present

## 2012-08-25 DIAGNOSIS — M1990 Unspecified osteoarthritis, unspecified site: Secondary | ICD-10-CM | POA: Diagnosis not present

## 2012-08-25 DIAGNOSIS — E785 Hyperlipidemia, unspecified: Secondary | ICD-10-CM | POA: Diagnosis not present

## 2012-08-25 DIAGNOSIS — M25519 Pain in unspecified shoulder: Secondary | ICD-10-CM | POA: Diagnosis not present

## 2012-08-29 DIAGNOSIS — Z7901 Long term (current) use of anticoagulants: Secondary | ICD-10-CM | POA: Diagnosis not present

## 2012-08-29 DIAGNOSIS — I4891 Unspecified atrial fibrillation: Secondary | ICD-10-CM | POA: Diagnosis not present

## 2012-09-14 DIAGNOSIS — H353290 Exudative age-related macular degeneration, unspecified eye, stage unspecified: Secondary | ICD-10-CM | POA: Diagnosis not present

## 2012-10-10 DIAGNOSIS — Z7901 Long term (current) use of anticoagulants: Secondary | ICD-10-CM | POA: Diagnosis not present

## 2012-10-10 DIAGNOSIS — I4891 Unspecified atrial fibrillation: Secondary | ICD-10-CM | POA: Diagnosis not present

## 2012-10-19 DIAGNOSIS — H35059 Retinal neovascularization, unspecified, unspecified eye: Secondary | ICD-10-CM | POA: Diagnosis not present

## 2012-10-19 DIAGNOSIS — H353290 Exudative age-related macular degeneration, unspecified eye, stage unspecified: Secondary | ICD-10-CM | POA: Diagnosis not present

## 2012-10-26 DIAGNOSIS — H353290 Exudative age-related macular degeneration, unspecified eye, stage unspecified: Secondary | ICD-10-CM | POA: Diagnosis not present

## 2012-11-21 DIAGNOSIS — Z7901 Long term (current) use of anticoagulants: Secondary | ICD-10-CM | POA: Diagnosis not present

## 2012-11-21 DIAGNOSIS — I4891 Unspecified atrial fibrillation: Secondary | ICD-10-CM | POA: Diagnosis not present

## 2012-11-30 DIAGNOSIS — H35059 Retinal neovascularization, unspecified, unspecified eye: Secondary | ICD-10-CM | POA: Diagnosis not present

## 2012-11-30 DIAGNOSIS — H353290 Exudative age-related macular degeneration, unspecified eye, stage unspecified: Secondary | ICD-10-CM | POA: Diagnosis not present

## 2012-12-05 DIAGNOSIS — M25519 Pain in unspecified shoulder: Secondary | ICD-10-CM | POA: Diagnosis not present

## 2012-12-05 DIAGNOSIS — R0602 Shortness of breath: Secondary | ICD-10-CM | POA: Diagnosis not present

## 2012-12-07 DIAGNOSIS — I359 Nonrheumatic aortic valve disorder, unspecified: Secondary | ICD-10-CM | POA: Diagnosis not present

## 2012-12-07 DIAGNOSIS — I4891 Unspecified atrial fibrillation: Secondary | ICD-10-CM | POA: Diagnosis not present

## 2012-12-07 DIAGNOSIS — I059 Rheumatic mitral valve disease, unspecified: Secondary | ICD-10-CM | POA: Diagnosis not present

## 2012-12-07 DIAGNOSIS — R0602 Shortness of breath: Secondary | ICD-10-CM | POA: Diagnosis not present

## 2012-12-22 DIAGNOSIS — M25819 Other specified joint disorders, unspecified shoulder: Secondary | ICD-10-CM | POA: Diagnosis not present

## 2013-01-03 ENCOUNTER — Ambulatory Visit: Payer: Medicare Other | Attending: Specialist | Admitting: Physical Therapy

## 2013-01-03 DIAGNOSIS — M25619 Stiffness of unspecified shoulder, not elsewhere classified: Secondary | ICD-10-CM | POA: Diagnosis not present

## 2013-01-03 DIAGNOSIS — R293 Abnormal posture: Secondary | ICD-10-CM | POA: Diagnosis not present

## 2013-01-03 DIAGNOSIS — M25519 Pain in unspecified shoulder: Secondary | ICD-10-CM | POA: Diagnosis not present

## 2013-01-03 DIAGNOSIS — IMO0001 Reserved for inherently not codable concepts without codable children: Secondary | ICD-10-CM | POA: Insufficient documentation

## 2013-01-04 DIAGNOSIS — H35059 Retinal neovascularization, unspecified, unspecified eye: Secondary | ICD-10-CM | POA: Diagnosis not present

## 2013-01-04 DIAGNOSIS — I4891 Unspecified atrial fibrillation: Secondary | ICD-10-CM | POA: Diagnosis not present

## 2013-01-04 DIAGNOSIS — H353290 Exudative age-related macular degeneration, unspecified eye, stage unspecified: Secondary | ICD-10-CM | POA: Diagnosis not present

## 2013-01-04 DIAGNOSIS — I359 Nonrheumatic aortic valve disorder, unspecified: Secondary | ICD-10-CM | POA: Diagnosis not present

## 2013-01-04 DIAGNOSIS — R0602 Shortness of breath: Secondary | ICD-10-CM | POA: Diagnosis not present

## 2013-01-04 DIAGNOSIS — Z7901 Long term (current) use of anticoagulants: Secondary | ICD-10-CM | POA: Diagnosis not present

## 2013-01-04 DIAGNOSIS — I059 Rheumatic mitral valve disease, unspecified: Secondary | ICD-10-CM | POA: Diagnosis not present

## 2013-01-12 ENCOUNTER — Ambulatory Visit: Payer: Medicare Other | Attending: Specialist | Admitting: Physical Therapy

## 2013-01-12 DIAGNOSIS — R293 Abnormal posture: Secondary | ICD-10-CM | POA: Insufficient documentation

## 2013-01-12 DIAGNOSIS — M25519 Pain in unspecified shoulder: Secondary | ICD-10-CM | POA: Insufficient documentation

## 2013-01-12 DIAGNOSIS — IMO0001 Reserved for inherently not codable concepts without codable children: Secondary | ICD-10-CM | POA: Diagnosis not present

## 2013-01-12 DIAGNOSIS — M25619 Stiffness of unspecified shoulder, not elsewhere classified: Secondary | ICD-10-CM | POA: Insufficient documentation

## 2013-01-19 ENCOUNTER — Ambulatory Visit: Payer: Medicare Other | Admitting: Physical Therapy

## 2013-01-19 DIAGNOSIS — R293 Abnormal posture: Secondary | ICD-10-CM | POA: Diagnosis not present

## 2013-01-19 DIAGNOSIS — M25519 Pain in unspecified shoulder: Secondary | ICD-10-CM | POA: Diagnosis not present

## 2013-01-19 DIAGNOSIS — IMO0001 Reserved for inherently not codable concepts without codable children: Secondary | ICD-10-CM | POA: Diagnosis not present

## 2013-01-19 DIAGNOSIS — M25619 Stiffness of unspecified shoulder, not elsewhere classified: Secondary | ICD-10-CM | POA: Diagnosis not present

## 2013-01-30 ENCOUNTER — Ambulatory Visit: Payer: Medicare Other | Admitting: Physical Therapy

## 2013-01-30 DIAGNOSIS — M25619 Stiffness of unspecified shoulder, not elsewhere classified: Secondary | ICD-10-CM | POA: Diagnosis not present

## 2013-01-30 DIAGNOSIS — R293 Abnormal posture: Secondary | ICD-10-CM | POA: Diagnosis not present

## 2013-01-30 DIAGNOSIS — M25519 Pain in unspecified shoulder: Secondary | ICD-10-CM | POA: Diagnosis not present

## 2013-01-30 DIAGNOSIS — IMO0001 Reserved for inherently not codable concepts without codable children: Secondary | ICD-10-CM | POA: Diagnosis not present

## 2013-02-15 DIAGNOSIS — I4891 Unspecified atrial fibrillation: Secondary | ICD-10-CM | POA: Diagnosis not present

## 2013-02-15 DIAGNOSIS — H353290 Exudative age-related macular degeneration, unspecified eye, stage unspecified: Secondary | ICD-10-CM | POA: Diagnosis not present

## 2013-02-15 DIAGNOSIS — H35059 Retinal neovascularization, unspecified, unspecified eye: Secondary | ICD-10-CM | POA: Diagnosis not present

## 2013-02-15 DIAGNOSIS — Z7901 Long term (current) use of anticoagulants: Secondary | ICD-10-CM | POA: Diagnosis not present

## 2013-02-27 DIAGNOSIS — H8110 Benign paroxysmal vertigo, unspecified ear: Secondary | ICD-10-CM | POA: Diagnosis not present

## 2013-03-13 ENCOUNTER — Ambulatory Visit: Payer: Medicare Other | Attending: Specialist | Admitting: Physical Therapy

## 2013-03-13 DIAGNOSIS — M25619 Stiffness of unspecified shoulder, not elsewhere classified: Secondary | ICD-10-CM | POA: Diagnosis not present

## 2013-03-13 DIAGNOSIS — R293 Abnormal posture: Secondary | ICD-10-CM | POA: Diagnosis not present

## 2013-03-13 DIAGNOSIS — IMO0001 Reserved for inherently not codable concepts without codable children: Secondary | ICD-10-CM | POA: Insufficient documentation

## 2013-03-13 DIAGNOSIS — M25519 Pain in unspecified shoulder: Secondary | ICD-10-CM | POA: Diagnosis not present

## 2013-03-22 DIAGNOSIS — H353290 Exudative age-related macular degeneration, unspecified eye, stage unspecified: Secondary | ICD-10-CM | POA: Diagnosis not present

## 2013-03-22 DIAGNOSIS — H35059 Retinal neovascularization, unspecified, unspecified eye: Secondary | ICD-10-CM | POA: Diagnosis not present

## 2013-03-29 DIAGNOSIS — I4891 Unspecified atrial fibrillation: Secondary | ICD-10-CM | POA: Diagnosis not present

## 2013-03-29 DIAGNOSIS — H6980 Other specified disorders of Eustachian tube, unspecified ear: Secondary | ICD-10-CM | POA: Diagnosis not present

## 2013-03-29 DIAGNOSIS — Z7901 Long term (current) use of anticoagulants: Secondary | ICD-10-CM | POA: Diagnosis not present

## 2013-03-29 DIAGNOSIS — E785 Hyperlipidemia, unspecified: Secondary | ICD-10-CM | POA: Diagnosis not present

## 2013-03-29 DIAGNOSIS — I10 Essential (primary) hypertension: Secondary | ICD-10-CM | POA: Diagnosis not present

## 2013-04-26 DIAGNOSIS — H353290 Exudative age-related macular degeneration, unspecified eye, stage unspecified: Secondary | ICD-10-CM | POA: Diagnosis not present

## 2013-04-26 DIAGNOSIS — H35359 Cystoid macular degeneration, unspecified eye: Secondary | ICD-10-CM | POA: Diagnosis not present

## 2013-05-11 DIAGNOSIS — I059 Rheumatic mitral valve disease, unspecified: Secondary | ICD-10-CM | POA: Diagnosis not present

## 2013-05-11 DIAGNOSIS — Z7901 Long term (current) use of anticoagulants: Secondary | ICD-10-CM | POA: Diagnosis not present

## 2013-05-11 DIAGNOSIS — R0602 Shortness of breath: Secondary | ICD-10-CM | POA: Diagnosis not present

## 2013-05-11 DIAGNOSIS — I4891 Unspecified atrial fibrillation: Secondary | ICD-10-CM | POA: Diagnosis not present

## 2013-05-11 DIAGNOSIS — I359 Nonrheumatic aortic valve disorder, unspecified: Secondary | ICD-10-CM | POA: Diagnosis not present

## 2013-05-29 DIAGNOSIS — R0602 Shortness of breath: Secondary | ICD-10-CM | POA: Diagnosis not present

## 2013-05-29 DIAGNOSIS — Z7901 Long term (current) use of anticoagulants: Secondary | ICD-10-CM | POA: Diagnosis not present

## 2013-05-29 DIAGNOSIS — I359 Nonrheumatic aortic valve disorder, unspecified: Secondary | ICD-10-CM | POA: Diagnosis not present

## 2013-05-29 DIAGNOSIS — I350 Nonrheumatic aortic (valve) stenosis: Secondary | ICD-10-CM | POA: Insufficient documentation

## 2013-05-29 DIAGNOSIS — I059 Rheumatic mitral valve disease, unspecified: Secondary | ICD-10-CM | POA: Diagnosis not present

## 2013-05-29 DIAGNOSIS — I4891 Unspecified atrial fibrillation: Secondary | ICD-10-CM | POA: Diagnosis not present

## 2013-05-30 DIAGNOSIS — D235 Other benign neoplasm of skin of trunk: Secondary | ICD-10-CM | POA: Diagnosis not present

## 2013-05-30 DIAGNOSIS — L570 Actinic keratosis: Secondary | ICD-10-CM | POA: Diagnosis not present

## 2013-05-30 DIAGNOSIS — D485 Neoplasm of uncertain behavior of skin: Secondary | ICD-10-CM | POA: Diagnosis not present

## 2013-05-31 DIAGNOSIS — H35359 Cystoid macular degeneration, unspecified eye: Secondary | ICD-10-CM | POA: Diagnosis not present

## 2013-05-31 DIAGNOSIS — H35059 Retinal neovascularization, unspecified, unspecified eye: Secondary | ICD-10-CM | POA: Diagnosis not present

## 2013-05-31 DIAGNOSIS — H353290 Exudative age-related macular degeneration, unspecified eye, stage unspecified: Secondary | ICD-10-CM | POA: Diagnosis not present

## 2013-06-08 ENCOUNTER — Telehealth: Payer: Self-pay | Admitting: *Deleted

## 2013-06-26 ENCOUNTER — Other Ambulatory Visit: Payer: Self-pay | Admitting: *Deleted

## 2013-06-26 ENCOUNTER — Institutional Professional Consult (permissible substitution) (INDEPENDENT_AMBULATORY_CARE_PROVIDER_SITE_OTHER): Payer: Medicare Other | Admitting: Thoracic Surgery (Cardiothoracic Vascular Surgery)

## 2013-06-26 VITALS — BP 133/92 | HR 81 | Resp 18 | Ht 67.0 in | Wt 169.0 lb

## 2013-06-26 DIAGNOSIS — Z9889 Other specified postprocedural states: Secondary | ICD-10-CM

## 2013-06-26 DIAGNOSIS — I059 Rheumatic mitral valve disease, unspecified: Secondary | ICD-10-CM

## 2013-06-26 DIAGNOSIS — I4891 Unspecified atrial fibrillation: Secondary | ICD-10-CM

## 2013-06-26 DIAGNOSIS — I4819 Other persistent atrial fibrillation: Secondary | ICD-10-CM | POA: Insufficient documentation

## 2013-06-26 DIAGNOSIS — K4190 Unilateral femoral hernia, without obstruction or gangrene, not specified as recurrent: Secondary | ICD-10-CM

## 2013-06-26 DIAGNOSIS — I350 Nonrheumatic aortic (valve) stenosis: Secondary | ICD-10-CM

## 2013-06-26 DIAGNOSIS — I071 Rheumatic tricuspid insufficiency: Secondary | ICD-10-CM

## 2013-06-26 DIAGNOSIS — I079 Rheumatic tricuspid valve disease, unspecified: Secondary | ICD-10-CM

## 2013-06-26 DIAGNOSIS — I340 Nonrheumatic mitral (valve) insufficiency: Secondary | ICD-10-CM

## 2013-06-26 DIAGNOSIS — I359 Nonrheumatic aortic valve disorder, unspecified: Secondary | ICD-10-CM | POA: Diagnosis not present

## 2013-06-26 NOTE — H&P (Signed)
[address]       [name] [address]             [phone]     CARDIOTHORACIC SURGERY CONSULTATION REPORT  Referring Provider is Everette Rank, MD PCP is Johny Blamer, MD  Chief Complaint  Patient presents with  . Aortic Stenosis    Surgical eval for possible TAVR, ECHO 05/29/2013    HPI:  Patient is a 79 year old retired white male from [place] who underwent minimally invasive mitral valve repair in 2005 by Dr. Silvestre Mesi at [facility].  The patient was reportedly left with some residual mitral regurgitation but has done very well clinically until recently. He developed atrial fibrillation several years ago and has been treated with pharmacologic rate control and anticoagulation with Coumadin. Cardiac catheterization performed in 2012 is notable for the absence of significant coronary artery disease but reportedly did demonstrate moderate mitral regurgitation and moderate aortic stenosis.  The pullback gradient across the aortic valve measured at catheterization was estimated 20 mm mercury at that time.  The patient was recently seen in followup by Dr. Eldridge Dace and noted to complain of some chest tightness and exertional shortness of breath which improved with diuretic therapy.  He underwent followup transthoracic echocardiogram 05/29/2013 that revealed significant progression of aortic stenosis with peak velocity across the aortic valve measured greater than 4 m/s corresponding to a mean transvalvular gradient of 40 mm mercury. Left ventricular systolic function appear preserved with ejection fraction estimated 50-55%. There remained "mild to moderate eccentric mitral valve regurgitation" and moderate tricuspid regurgitation with moderately elevated right ventricular systolic pressure. The patient has been referred for surgical consultation to discuss treatment options.  The patient remains physically active and completely functionally  independent. He enjoys playing golf 2 days a week. He has mild exertional shortness of breath which occurs only with moderately strenuous activities such as walking up an incline or lifting something heavy, probably functionally class II at most.  He denies resting shortness of breath, PND, orthopnea, or lower extremity edema. He denies any history of tachypalpitations although he has remained in atrial fibrillation for some time now. He currently denies any recent history of chest tightness or chest pressure.  Past Medical History  Diagnosis Date  . Hyperlipidemia   . Hypertension   . Heart murmur   . Hydrocele     left  . Kidney stones   . Internal hemorrhoids   . Vocal cord paralysis   . Macular degeneration     right eye  . Blindness     left eye  . OA (osteoarthritis) of neck   . Elevated PSA   . AK (actinic keratosis)   . Severe aortic stenosis 05/29/2013  . S/P mitral valve repair 09/15/2004    Complex valvuloplasty including artificial Goretex neocord placement x4 and 36mm SARP ring annuloplasty via right mini thoracotomy approach - Dr Silvestre Mesi @ [facility]  . Mitral regurgitation 02/25/2011    Recurrent MR s/p mitral valve repair   . Atrial fibrillation, persistent     chronic coumadin therapy    Past Surgical History  Procedure Laterality Date  . Groin mass open biopsy  2012  . Mitral valve repair  09/16/2011    @ [facility], Dr Silvestre Mesi  . Inguinal hernia repair  08/2011    Family History  Problem Relation Age of Onset  . Cancer Father     colon  . Hypertension Father   . Diabetes Father   .  Hyperlipidemia Father   . Stroke Father   . Hypertension Mother   . Diabetes Mother   . Hyperlipidemia Mother   . Stroke Mother   . Heart attack Mother   . Hypertension Brother     MI, S/P MVR    History   Social History  . Marital Status: Married    Spouse Name: N/A    Number of Children: 0  . Years of Education: N/A   Occupational History  . retired    Social History Main  Topics  . Smoking status: Former Smoker    Types: Cigarettes    Quit date: 10/12/1978  . Smokeless tobacco: Never Used  . Alcohol Use: No  . Drug Use: No  . Sexual Activity: Not on file   Other Topics Concern  . Not on file   Social History Narrative  . No narrative on file    Current Outpatient Prescriptions  Medication Sig Dispense Refill  . acetaminophen (TYLENOL) 650 MG CR tablet Take 650 mg by mouth every 8 (eight) hours as needed for pain.      Marland Kitchen atorvastatin (LIPITOR) 20 MG tablet Take 10 mg by mouth every evening.       Marland Kitchen b complex vitamins tablet Take 1 tablet by mouth daily.      Marland Kitchen CARTIA XT 240 MG 24 hr capsule Take 240 mg by mouth every evening.       . furosemide (LASIX) 40 MG tablet Take 40 mg by mouth daily.       Marland Kitchen glucosamine-chondroitin 500-400 MG tablet Take 1 tablet by mouth 3 (three) times daily.      Marland Kitchen lisinopril (PRINIVIL,ZESTRIL) 20 MG tablet Take 10 mg by mouth daily.       . Multiple Vitamin (MULTIVITAMIN) capsule Take 1 capsule by mouth daily.      . Multiple Vitamins-Minerals (PRESERVISION AREDS PO) Take by mouth 2 (two) times daily.      . vitamin B-12 (CYANOCOBALAMIN) 1000 MCG tablet Take 1,000 mcg by mouth daily.      . vitamin E (VITAMIN E) 400 UNIT capsule Take 400 Units by mouth daily.      Marland Kitchen warfarin (COUMADIN) 5 MG tablet 5-7.5 mg daily. 5mg  on Monday and Friday; 7.5mg  Sunday, Tuesday, Wednesday, Thursday, Saturday       No current facility-administered medications for this visit.    Allergies  Allergen Reactions  . Penicillins Rash      Review of Systems:   General:  normal appetite, decreased energy, no weight gain, no weight loss, no fever  Cardiac:  no chest pain with exertion, no chest pain at rest, + SOB with moderate exertion, no resting SOB, no PND, no orthopnea, no palpitations, + arrhythmia, + atrial fibrillation, no LE edema, no dizzy spells, no syncope  Respiratory:  + mild exertional shortness of breath, no home oxygen, no  productive cough, no dry cough, no bronchitis, no wheezing, no hemoptysis, no asthma, no pain with inspiration or cough, no sleep apnea, no CPAP at night  GI:   no difficulty swallowing, no reflux, no frequent heartburn, + hiatal hernia, no abdominal pain, no constipation, no diarrhea, no hematochezia, no hematemesis, no melena  GU:   no dysuria,  no frequency, no urinary tract infection, no hematuria, no enlarged prostate, + kidney stones, no kidney disease  Vascular:  no pain suggestive of claudication, no pain in feet, no leg cramps, no varicose veins, no DVT, no non-healing foot ulcer  Neuro:  no stroke, no TIA's, no seizures, no headaches, no temporary blindness one eye,  no slurred speech, no peripheral neuropathy, no chronic pain, no instability of gait, no memory/cognitive dysfunction  Musculoskeletal: + mild arthritis particularly in right shoulder and back, no joint swelling, no myalgias, no difficulty walking, normal mobility   Skin:   no rash, no itching, no skin infections, no pressure sores or ulcerations  Psych:   no anxiety, no depression, no nervousness, no unusual recent stress  Eyes:   no blurry vision, no floaters, no recent vision changes, + wears glasses or contacts  ENT:   no hearing loss, no loose or painful teeth, no dentures, last saw dentist 2/14  Hematologic:  no easy bruising, no abnormal bleeding, no clotting disorder, no frequent epistaxis, no problems on chronic coumadin therapy  Endocrine:  no diabetes, does not check CBG's at home     Physical Exam:   BP 133/92  Pulse 81  Resp 18  Ht 5\' 7"  (1.702 m)  Wt 169 lb (76.658 kg)  BMI 26.46 kg/m2  SpO2 97%  General:    well-appearing  HEENT:  Unremarkable   Neck:   no JVD, no bruits, no adenopathy   Chest:   clear to auscultation, symmetrical breath sounds, no wheezes, no rhonchi   CV:   Irregular rate and rhythm, prominent systolic murmur heard at RSB and apex, radiating across precordium  Abdomen:  soft,  non-tender, no masses   Extremities:  warm, well-perfused, pulses diminished at ankle, palpable in groin, no LE edema  Rectal/GU  Deferred  Neuro:   Grossly non-focal and symmetrical throughout  Skin:   Clean and dry, no rashes, no breakdown   Diagnostic Tests:  TRANSTHORACIC ECHOCARDIOGRAM  Both report and the images from transthoracic echocardiogram performed 05/29/2013 at [facility] cardiology are reviewed. There is severe calcific aortic stenosis. Aortic valve appears tricuspid. All 3 leaflets are calcified with restricted leaflet motion. Peak velocity across the aortic valve ranges between 3.6 and 4.4 m/s corresponding to peak and mean transvalvular gradients of 66 and 40 mm mercury respectively. There is mild aortic insufficiency.  The left ventricular output tract measures 2.1 cm in diameter. There is normal left ventricular systolic function with ejection fraction estimated 50-55%. The mitral valve has been repaired. There is severe leaflet restriction of the posterior leaflet with an eccentric jet of mitral regurgitation coursing anteriorly. In some views the severity appears mild and others it appears more significant. There is moderate tricuspid regurgitation. The left and right atrium are enlarged.  STS Risk Calculator  Procedure    AVR  Risk of Mortality   3.4% Morbidity or Mortality  23.7% Prolonged LOS   8.3% Short LOS    27.8% Permanent Stroke   2.2% Prolonged Vent Support  14.5% DSW Infection    0.2% Renal Failure    5.5% Reoperation    9.4%    Impression:  The patient has severe calcific aortic stenosis with gradual progression of symptoms of exertional shortness of breath, functional class II. Left ventricular systolic function appears preserved. The patient does appear to have significant diastolic dysfunction as well as moderate residual mitral regurgitation with chronic persistent atrial fibrillation. Options include continued medical therapy with close observation  versus proceeding with aortic valve replacement with or without redo mitral valve repair or replacement.  Risks associated with conventional surgical aortic valve replacement would at this point appeared to be only moderately elevated because of the patient's age and history of previous mitral valve  repair.  However, I am particular concerned by the presence of significant residual mitral regurgitation with pulmonary hypertension.      Plan:  I favor proceeding with transesophageal echocardiogram to more definitively evaluate and quantify the patient's residual mitral regurgitation. Left and right heart catheterization will be needed as well including reassessment of the transvalvular gradient across the aortic valve. Finally, CT angiogram of the chest abdomen and pelvis will be useful to characterize the presence or absence of significant calcification and/or atherosclerotic disease of the aorta which might affect risks associated with conventional surgery and/or potential surgical options. I discussed matters at length with the patient and his wife here in the office today. We'll plan to see him back in 3 weeks after his testing as been completed.    Salvatore Decent. Cornelius Moras, MD 06/26/2013 10:53 AM

## 2013-06-27 ENCOUNTER — Other Ambulatory Visit: Payer: Self-pay | Admitting: *Deleted

## 2013-06-27 DIAGNOSIS — I350 Nonrheumatic aortic (valve) stenosis: Secondary | ICD-10-CM

## 2013-06-29 ENCOUNTER — Encounter (HOSPITAL_COMMUNITY): Payer: Self-pay

## 2013-06-29 DIAGNOSIS — I4891 Unspecified atrial fibrillation: Secondary | ICD-10-CM | POA: Diagnosis not present

## 2013-06-29 DIAGNOSIS — Z7901 Long term (current) use of anticoagulants: Secondary | ICD-10-CM | POA: Diagnosis not present

## 2013-06-30 DIAGNOSIS — R0602 Shortness of breath: Secondary | ICD-10-CM | POA: Diagnosis not present

## 2013-06-30 DIAGNOSIS — I059 Rheumatic mitral valve disease, unspecified: Secondary | ICD-10-CM | POA: Diagnosis not present

## 2013-06-30 DIAGNOSIS — I359 Nonrheumatic aortic valve disorder, unspecified: Secondary | ICD-10-CM | POA: Diagnosis not present

## 2013-06-30 DIAGNOSIS — I4891 Unspecified atrial fibrillation: Secondary | ICD-10-CM | POA: Diagnosis not present

## 2013-07-03 DIAGNOSIS — Z23 Encounter for immunization: Secondary | ICD-10-CM | POA: Diagnosis not present

## 2013-07-03 DIAGNOSIS — D179 Benign lipomatous neoplasm, unspecified: Secondary | ICD-10-CM | POA: Diagnosis not present

## 2013-07-04 ENCOUNTER — Other Ambulatory Visit: Payer: Self-pay | Admitting: Interventional Cardiology

## 2013-07-04 DIAGNOSIS — H353290 Exudative age-related macular degeneration, unspecified eye, stage unspecified: Secondary | ICD-10-CM | POA: Diagnosis not present

## 2013-07-04 DIAGNOSIS — H35359 Cystoid macular degeneration, unspecified eye: Secondary | ICD-10-CM | POA: Diagnosis not present

## 2013-07-04 DIAGNOSIS — I4891 Unspecified atrial fibrillation: Secondary | ICD-10-CM | POA: Diagnosis not present

## 2013-07-04 DIAGNOSIS — H35059 Retinal neovascularization, unspecified, unspecified eye: Secondary | ICD-10-CM | POA: Diagnosis not present

## 2013-07-05 ENCOUNTER — Encounter (HOSPITAL_COMMUNITY)
Admission: RE | Disposition: A | Discharge status: Home / Self Care | Payer: Self-pay | Source: Ambulatory Visit | Attending: Interventional Cardiology

## 2013-07-05 ENCOUNTER — Encounter (HOSPITAL_COMMUNITY): Payer: Self-pay | Admitting: *Deleted

## 2013-07-05 ENCOUNTER — Ambulatory Visit (HOSPITAL_COMMUNITY)
Admission: RE | Admit: 2013-07-05 | Discharge: 2013-07-05 | Disposition: A | Discharge status: Home / Self Care | Payer: Medicare Other | Source: Ambulatory Visit | Attending: Interventional Cardiology | Admitting: Interventional Cardiology

## 2013-07-05 DIAGNOSIS — I080 Rheumatic disorders of both mitral and aortic valves: Secondary | ICD-10-CM | POA: Insufficient documentation

## 2013-07-05 DIAGNOSIS — I2789 Other specified pulmonary heart diseases: Secondary | ICD-10-CM | POA: Insufficient documentation

## 2013-07-05 HISTORY — PX: LEFT AND RIGHT HEART CATHETERIZATION WITH CORONARY ANGIOGRAM: SHX5449

## 2013-07-05 HISTORY — PX: TEE WITHOUT CARDIOVERSION: SHX5443

## 2013-07-05 LAB — POCT I-STAT 3, VENOUS BLOOD GAS (G3P V)
Acid-Base Excess: 1 mmol/L (ref 0.0–2.0)
Bicarbonate: 26.1 mEq/L — ABNORMAL HIGH (ref 20.0–24.0)
Bicarbonate: 26.7 mEq/L — ABNORMAL HIGH (ref 20.0–24.0)
Bicarbonate: 27.1 mEq/L — ABNORMAL HIGH (ref 20.0–24.0)
O2 Saturation: 62 %
O2 Saturation: 59 %
O2 Saturation: 65 %
TCO2: 27 mmol/L (ref 0–100)
TCO2: 28 mmol/L (ref 0–100)
TCO2: 29 mmol/L (ref 0–100)
pCO2, Ven: 46.3 mmHg (ref 45.0–50.0)
pCO2, Ven: 49.4 mmHg (ref 45.0–50.0)
pCO2, Ven: 49.6 mmHg (ref 45.0–50.0)
pH, Ven: 7.358 — ABNORMAL HIGH (ref 7.250–7.300)
pH, Ven: 7.339 — ABNORMAL HIGH (ref 7.250–7.300)
pH, Ven: 7.348 — ABNORMAL HIGH (ref 7.250–7.300)
pO2, Ven: 34 mmHg (ref 30.0–45.0)
pO2, Ven: 33 mmHg (ref 30.0–45.0)
pO2, Ven: 36 mmHg (ref 30.0–45.0)

## 2013-07-05 LAB — POCT I-STAT 3, ART BLOOD GAS (G3+)
Acid-base deficit: 1 mmol/L (ref 0.0–2.0)
Bicarbonate: 24.4 mEq/L — ABNORMAL HIGH (ref 20.0–24.0)
Bicarbonate: 24.5 mEq/L — ABNORMAL HIGH (ref 20.0–24.0)
O2 Saturation: 95 %
O2 Saturation: 89 %
TCO2: 26 mmol/L (ref 0–100)
TCO2: 26 mmol/L (ref 0–100)
pCO2 arterial: 39.3 mmHg (ref 35.0–45.0)
pCO2 arterial: 41.3 mmHg (ref 35.0–45.0)
pH, Arterial: 7.402 (ref 7.350–7.450)
pH, Arterial: 7.381 (ref 7.350–7.450)
pO2, Arterial: 77 mmHg — ABNORMAL LOW (ref 80.0–100.0)
pO2, Arterial: 57 mmHg — ABNORMAL LOW (ref 80.0–100.0)

## 2013-07-05 SURGERY — LEFT AND RIGHT HEART CATHETERIZATION WITH CORONARY ANGIOGRAM
Anesthesia: LOCAL

## 2013-07-05 SURGERY — ECHOCARDIOGRAM, TRANSESOPHAGEAL
Anesthesia: Moderate Sedation

## 2013-07-05 MED ORDER — MIDAZOLAM HCL 5 MG/ML IJ SOLN
INTRAMUSCULAR | Status: AC
  Filled 2013-07-05: qty 2

## 2013-07-05 MED ORDER — HEPARIN SODIUM (PORCINE) 1000 UNIT/ML IJ SOLN
INTRAMUSCULAR | Status: AC
  Filled 2013-07-05: qty 1

## 2013-07-05 MED ORDER — FENTANYL CITRATE 0.05 MG/ML IJ SOLN
INTRAMUSCULAR | Status: AC
  Filled 2013-07-05: qty 2

## 2013-07-05 MED ORDER — FENTANYL CITRATE 0.05 MG/ML IJ SOLN
INTRAMUSCULAR | Status: DC | PRN
  Administered 2013-07-05 (×2): 25 ug via INTRAVENOUS

## 2013-07-05 MED ORDER — SODIUM CHLORIDE 0.9 % IV SOLN
INTRAVENOUS | Status: DC
  Administered 2013-07-05: 10:00:00 via INTRAVENOUS

## 2013-07-05 MED ORDER — DIAZEPAM 5 MG PO TABS: 5.0000 mg | ORAL_TABLET | ORAL | Status: DC

## 2013-07-05 MED ORDER — VERAPAMIL HCL 2.5 MG/ML IV SOLN
INTRAVENOUS | Status: AC
  Filled 2013-07-05: qty 2

## 2013-07-05 MED ORDER — SODIUM CHLORIDE 0.9 % IJ SOLN: 3.0000 mL | Freq: Two times a day (BID) | INTRAMUSCULAR | Status: DC

## 2013-07-05 MED ORDER — LIDOCAINE VISCOUS 2 % MT SOLN
OROMUCOSAL | Status: AC
  Filled 2013-07-05: qty 15

## 2013-07-05 MED ORDER — MIDAZOLAM HCL 10 MG/2ML IJ SOLN
INTRAMUSCULAR | Status: DC | PRN
  Administered 2013-07-05 (×2): 1 mg via INTRAVENOUS
  Administered 2013-07-05: 2 mg via INTRAVENOUS

## 2013-07-05 MED ORDER — LIDOCAINE VISCOUS 2 % MT SOLN
OROMUCOSAL | Status: DC | PRN
  Administered 2013-07-05: 15 mL via OROMUCOSAL

## 2013-07-05 MED ORDER — ASPIRIN 81 MG PO CHEW: 324.0000 mg | CHEWABLE_TABLET | ORAL | Status: DC

## 2013-07-05 MED ORDER — MIDAZOLAM HCL 2 MG/2ML IJ SOLN
INTRAMUSCULAR | Status: AC
  Filled 2013-07-05: qty 2

## 2013-07-05 MED ORDER — SODIUM CHLORIDE 0.9 % IV SOLN: INTRAVENOUS | Status: DC

## 2013-07-05 MED ORDER — HEPARIN (PORCINE) IN NACL 2-0.9 UNIT/ML-% IJ SOLN
INTRAMUSCULAR | Status: AC
  Filled 2013-07-05: qty 1000

## 2013-07-05 MED ORDER — SODIUM CHLORIDE 0.9 % IJ SOLN: 3.0000 mL | INTRAMUSCULAR | Status: DC | PRN

## 2013-07-05 MED ORDER — NITROGLYCERIN 0.2 MG/ML ON CALL CATH LAB
INTRAVENOUS | Status: AC
  Filled 2013-07-05: qty 1

## 2013-07-05 MED ORDER — SODIUM CHLORIDE 0.9 % IV SOLN: 250.0000 mL | INTRAVENOUS | Status: DC | PRN

## 2013-07-05 MED ORDER — LIDOCAINE HCL (PF) 1 % IJ SOLN
INTRAMUSCULAR | Status: AC
  Filled 2013-07-05: qty 30

## 2013-07-05 NOTE — CV Procedure (Signed)
TEE attempted.  Unable to pass the scope into the esophagus after three attempts.  Scope advenced to 15 cm.  Patient had some transient throat pain but no chest pain.  THroat pain has resolved.  Mild bloody secretrions suctioned initially but this cleared.  Will plan to do cardiac cath.  Attempt TEE at a later time.

## 2013-07-05 NOTE — CV Procedure (Addendum)
PROCEDURE:  Right and Left heart catheterization with selective coronary angiography, left ventriculogram.    INDICATIONS:  Aortic stenosis; mitral regurgitation  The risks, benefits, and details of the procedure were explained to the patient.  The patient verbalized understanding and wanted to proceed.  Informed written consent was obtained.  PROCEDURE TECHNIQUE:  A 5 French sheath was exchanged over a wire into a right antecubital vein.  After Xylocaine anesthesia a 16F slender sheath was placed in the right radial artery with a single anterior needle wall stick.   Left coronary angiography was done using a Judkins L3.5 guide catheter.  Right coronary angiography was done using a Judkins R4 guide catheter.  Access to the left ventricle was obtained using an AL-1 catheter with a straight wire.  IV heparin was given during the procedure.  Left ventriculography was done using a Langston catheter.    CONTRAST:  Total of 60 cc.  COMPLICATIONS:  None.    HEMODYNAMICS:  Aortic pressure was 94/60; LV pressure was 146/6; LVEDP 15.  There was a  gradient between the left ventricle and aorta.  Right atrial pressure 14 or 15, mean right atrial pressure 13 mm Hg.  RV pressure seventy over three, RVEDP 10 mm Hg.  PA pressure 63/24, mean PA pressure 41 mm Hg.  Pulmonary capillary wedge pressure 26/46, mean capillary wedge pressure of 29 mm Hg.  PA saturation 59%.  Aortic saturation 89%.  Cardiac output 4.78 L per minute.  Cardiac index by Fick 2.53.  Calculated aortic valve area using the Gorlin formula 0.77 cm.    Large V waves noted on the wedge tracing.  ANGIOGRAPHIC DATA:   The left main coronary artery is widely patent.  The left anterior descending artery is a large vessel which wrapped around the apex.  There are 2 medium-sized diagonals.  There is mild atherosclerosis in the proximal area of the first diagonal.  The second diagonal is patent.  The left circumflex artery is a large vessel.  The OM1 is  small but patent.  The OM 2 is medium sized and patent.  The OM 3 is a large vessel and widely patent.  The right coronary artery is a large dominant vessel.  There is a 20% calcified lesion in the proximal vessel.  The bifurcation of the posterior lateral artery and posterior descending artery happens in the mid RCA.  The posterior lateral artery is large and widely patent.  The posterior descending artery is also large and widely patent.  LEFT VENTRICULOGRAM:  Left ventricular angiogram was done in the 30 RAO projection and revealed normal left ventricular wall motion and systolic function with an estimated ejection fraction of 60 %.  LVEDP was  15 mmHg.  At least 2+ MR.  IMPRESSIONS:  1. No significant coronary artery disease. 2. Normal left ventricular systolic function.  LVEDP 15 mmHg.  Ejection fraction 60%. 3.   Moderate pulmonary hypertension.  Cardiac index 2.53.  Prominent V waves noted on the wedge tracing. 4.   Severe aortic stenosis with calculated aortic valve area of 0.77 cm.  RECOMMENDATION:  Will send results to Dr. Cornelius Moras.  We'll have to decide on intervention on his aortic and mitral valves.  We'll also have to repeat attempt at transesophageal echocardiogram to better visualize the valves.

## 2013-07-05 NOTE — H&P (Addendum)
Date of Initial H&P: 06/30/13  History reviewed, patient examined, no change in status, stable for surgery.

## 2013-07-05 NOTE — H&P (Signed)
  Date of Initial H&P: 06/30/13  History reviewed, patient examined, no change in status, stable for surgery.

## 2013-07-06 ENCOUNTER — Encounter (HOSPITAL_COMMUNITY): Payer: Self-pay | Admitting: Interventional Cardiology

## 2013-07-06 ENCOUNTER — Other Ambulatory Visit: Payer: Self-pay | Admitting: Interventional Cardiology

## 2013-07-21 ENCOUNTER — Ambulatory Visit
Admission: RE | Admit: 2013-07-21 | Discharge: 2013-07-21 | Disposition: A | Discharge status: Home / Self Care | Payer: Medicare Other | Source: Ambulatory Visit | Attending: Thoracic Surgery (Cardiothoracic Vascular Surgery) | Admitting: Thoracic Surgery (Cardiothoracic Vascular Surgery)

## 2013-07-21 DIAGNOSIS — I359 Nonrheumatic aortic valve disorder, unspecified: Secondary | ICD-10-CM

## 2013-07-21 DIAGNOSIS — I350 Nonrheumatic aortic (valve) stenosis: Secondary | ICD-10-CM

## 2013-07-21 DIAGNOSIS — I059 Rheumatic mitral valve disease, unspecified: Secondary | ICD-10-CM

## 2013-07-24 ENCOUNTER — Ambulatory Visit (INDEPENDENT_AMBULATORY_CARE_PROVIDER_SITE_OTHER): Payer: Medicare Other | Admitting: Thoracic Surgery (Cardiothoracic Vascular Surgery)

## 2013-07-24 ENCOUNTER — Ambulatory Visit: Admission: RE | Admit: 2013-07-24 | Payer: Medicare Other | Source: Ambulatory Visit

## 2013-07-24 ENCOUNTER — Ambulatory Visit
Admission: RE | Admit: 2013-07-24 | Discharge: 2013-07-24 | Disposition: A | Discharge status: Home / Self Care | Payer: Medicare Other | Source: Ambulatory Visit | Attending: Thoracic Surgery (Cardiothoracic Vascular Surgery) | Admitting: Thoracic Surgery (Cardiothoracic Vascular Surgery)

## 2013-07-24 ENCOUNTER — Other Ambulatory Visit: Payer: Self-pay | Admitting: Thoracic Surgery (Cardiothoracic Vascular Surgery)

## 2013-07-24 ENCOUNTER — Encounter: Payer: Self-pay | Admitting: Thoracic Surgery (Cardiothoracic Vascular Surgery)

## 2013-07-24 ENCOUNTER — Other Ambulatory Visit: Payer: Self-pay | Admitting: *Deleted

## 2013-07-24 ENCOUNTER — Ambulatory Visit (HOSPITAL_COMMUNITY): Payer: Medicare Other

## 2013-07-24 ENCOUNTER — Ambulatory Visit: Payer: Medicare Other | Admitting: Thoracic Surgery (Cardiothoracic Vascular Surgery)

## 2013-07-24 VITALS — BP 124/75 | HR 74 | Resp 20 | Ht 67.0 in | Wt 169.0 lb

## 2013-07-24 DIAGNOSIS — N433 Hydrocele, unspecified: Secondary | ICD-10-CM | POA: Diagnosis not present

## 2013-07-24 DIAGNOSIS — Z9889 Other specified postprocedural states: Secondary | ICD-10-CM

## 2013-07-24 DIAGNOSIS — R1310 Dysphagia, unspecified: Secondary | ICD-10-CM

## 2013-07-24 DIAGNOSIS — I071 Rheumatic tricuspid insufficiency: Secondary | ICD-10-CM

## 2013-07-24 DIAGNOSIS — N401 Enlarged prostate with lower urinary tract symptoms: Secondary | ICD-10-CM | POA: Diagnosis not present

## 2013-07-24 DIAGNOSIS — N432 Other hydrocele: Secondary | ICD-10-CM | POA: Diagnosis not present

## 2013-07-24 DIAGNOSIS — I340 Nonrheumatic mitral (valve) insufficiency: Secondary | ICD-10-CM

## 2013-07-24 DIAGNOSIS — I359 Nonrheumatic aortic valve disorder, unspecified: Secondary | ICD-10-CM | POA: Diagnosis not present

## 2013-07-24 DIAGNOSIS — I059 Rheumatic mitral valve disease, unspecified: Secondary | ICD-10-CM

## 2013-07-24 DIAGNOSIS — I2510 Atherosclerotic heart disease of native coronary artery without angina pectoris: Secondary | ICD-10-CM | POA: Diagnosis not present

## 2013-07-24 DIAGNOSIS — I350 Nonrheumatic aortic (valve) stenosis: Secondary | ICD-10-CM

## 2013-07-24 DIAGNOSIS — Z125 Encounter for screening for malignant neoplasm of prostate: Secondary | ICD-10-CM | POA: Diagnosis not present

## 2013-07-24 DIAGNOSIS — I4891 Unspecified atrial fibrillation: Secondary | ICD-10-CM

## 2013-07-24 DIAGNOSIS — K8020 Calculus of gallbladder without cholecystitis without obstruction: Secondary | ICD-10-CM | POA: Diagnosis not present

## 2013-07-24 DIAGNOSIS — I079 Rheumatic tricuspid valve disease, unspecified: Secondary | ICD-10-CM | POA: Diagnosis not present

## 2013-07-24 DIAGNOSIS — I4819 Other persistent atrial fibrillation: Secondary | ICD-10-CM

## 2013-07-24 MED ORDER — IOHEXOL 350 MG/ML SOLN
125.0000 mL | Freq: Once | INTRAVENOUS | Status: AC | PRN
  Administered 2013-07-24: 125 mL via INTRAVENOUS

## 2013-07-24 NOTE — Progress Notes (Signed)
[address]       [name] [address]             [phone]     CARDIOTHORACIC SURGERY OFFICE NOTE  Referring Provider is Everette Rank, MD PCP is Johny Blamer, MD   HPI:  Patient returns for followup of severe symptomatic aortic stenosis, mitral regurgitation, atrial fibrillation, and tricuspid regurgitation.  He was originally seen in consultation 06/26/2013. Since then he underwent left and right heart catheterization and attempted transesophageal echocardiogram by Dr. Eldridge Dace on 07/05/2013.  Transesophageal echocardiogram was aborted because of difficulty passing the scope into the esophagus.  Cardiac catheterization confirmed the presence of severe aortic stenosis and was notable for the absence of significant coronary artery disease. Pulmonary artery pressures were elevated and associated with significant V waves on wedge tracing, suggestive of the presence of severe mitral regurgitation. The patient has subsequently undergone CT angiogram of the chest, abdomen, and pelvis and he returns to the office today to discuss matters further. He reports no new polyps or complaints since his previous office visit except for the fact that his voice remains a bit hoarse since his attempted transesophageal echocardiogram.   Current Outpatient Prescriptions  Medication Sig Dispense Refill  . acetaminophen (TYLENOL) 650 MG CR tablet Take 650 mg by mouth every 8 (eight) hours as needed for pain.      Marland Kitchen atorvastatin (LIPITOR) 20 MG tablet Take 20 mg by mouth every evening.       Marland Kitchen b complex vitamins tablet Take 1 tablet by mouth daily.      Marland Kitchen CARTIA XT 240 MG 24 hr capsule Take 240 mg by mouth every evening.       . furosemide (LASIX) 40 MG tablet Take 40 mg by mouth 2 (two) times daily.       Marland Kitchen glucosamine-chondroitin 500-400 MG tablet Take 1 tablet by mouth 2 (two) times daily.       Marland Kitchen lisinopril (PRINIVIL,ZESTRIL) 20 MG tablet Take 10 mg by mouth daily.       .  Multiple Vitamin (MULTIVITAMIN) capsule Take 1 capsule by mouth daily.      . Multiple Vitamins-Minerals (PRESERVISION AREDS PO) Take by mouth 2 (two) times daily.      . vitamin B-12 (CYANOCOBALAMIN) 1000 MCG tablet Take 1,000 mcg by mouth daily.      Marland Kitchen warfarin (COUMADIN) 5 MG tablet Take 5-7.5 mg by mouth See admin instructions. 5mg  on Monday and Friday; 7.5mg  Sunday, Tuesday, Wednesday, Thursday, Saturday       No current facility-administered medications for this visit.      Physical Exam:   BP 124/75  Pulse 74  Resp 20  Ht 5\' 7"  (1.702 m)  Wt 169 lb (76.658 kg)  BMI 26.46 kg/m2  SpO2 97%  General:  Well-appearing  Chest:   Clear to auscultation  CV:   Irregular rate and rhythm with systolic murmur  Incisions:  n/a  Abdomen:  Soft and nontender  Extremities:  Warm and well-perfused  Diagnostic Tests:  CARDIAC CATHETERIZATION  PROCEDURE: Right and Left heart catheterization with selective coronary angiography, left ventriculogram.  INDICATIONS: Aortic stenosis; mitral regurgitation  The risks, benefits, and details of the procedure were explained to the patient. The patient verbalized understanding and wanted to proceed. Informed written consent was obtained.  PROCEDURE TECHNIQUE: A 5 French sheath was exchanged over a wire into a right antecubital vein. After Xylocaine anesthesia a 39F slender sheath was placed in  the right radial artery with a single anterior needle wall stick. Left coronary angiography was done using a Judkins L3.5 guide catheter. Right coronary angiography was done using a Judkins R4 guide catheter. Access to the left ventricle was obtained using an AL-1 catheter with a straight wire. IV heparin was given during the procedure. Left ventriculography was done using a Langston catheter.  CONTRAST: Total of 60 cc.  COMPLICATIONS: None.  HEMODYNAMICS: Aortic pressure was 94/60; LV pressure was 146/6; LVEDP 15. There was a gradient between the left ventricle and  aorta. Right atrial pressure 14 or 15, mean right atrial pressure 13 mm Hg. RV pressure seventy over three, RVEDP 10 mm Hg. PA pressure 63/24, mean PA pressure 41 mm Hg. Pulmonary capillary wedge pressure 26/46, mean capillary wedge pressure of 29 mm Hg. PA saturation 59%. Aortic saturation 89%. Cardiac output 4.78 L per minute. Cardiac index by Fick 2.53. Calculated aortic valve area using the Gorlin formula 0.77 cm. Large V waves noted on the wedge tracing.  ANGIOGRAPHIC DATA: The left main coronary artery is widely patent.  The left anterior descending artery is a large vessel which wrapped around the apex. There are 2 medium-sized diagonals. There is mild atherosclerosis in the proximal area of the first diagonal. The second diagonal is patent.  The left circumflex artery is a large vessel. The OM1 is small but patent. The OM 2 is medium sized and patent. The OM 3 is a large vessel and widely patent.  The right coronary artery is a large dominant vessel. There is a 20% calcified lesion in the proximal vessel. The bifurcation of the posterior lateral artery and posterior descending artery happens in the mid RCA. The posterior lateral artery is large and widely patent. The posterior descending artery is also large and widely patent.  LEFT VENTRICULOGRAM: Left ventricular angiogram was done in the 30 RAO projection and revealed normal left ventricular wall motion and systolic function with an estimated ejection fraction of 60 %. LVEDP was 15 mmHg. At least 2+ MR.  IMPRESSIONS:  1. No significant coronary artery disease. 2. Normal left ventricular systolic function. LVEDP 15 mmHg. Ejection fraction 60%. 3. Moderate pulmonary hypertension. Cardiac index 2.53. Prominent V waves noted on the wedge tracing.  4. Severe aortic stenosis with calculated aortic valve area of 0.77 cm.  RECOMMENDATION: Will send results to Dr. Cornelius Moras. We'll have to decide on intervention on his aortic and mitral valves. We'll also  have to repeat attempt at transesophageal echocardiogram to better visualize the valves.    CT ANGIOGRAPHY CHEST, ABDOMEN AND PELVIS  Technique: Multidetector CT imaging through the chest, abdomen and  pelvis was performed using the standard protocol during bolus  administration of intravenous contrast. Multiplanar reconstructed  images including MIPs were obtained and reviewed to evaluate the  vascular anatomy.  Contrast: OMNIPAQUE IOHEXOL 350 MG/ML SOLN,  Comparison: CT abdomen pelvis - 04/28/2004  CTA CHEST  Vascular Findings:  Scattered mixed calcified and noncalcified atherosclerotic plaque  within a normal caliber thoracic aorta with measurements as  follows. No thoracic aortic dissection or periaortic stranding.  The left vertebral artery is incidentally noted to arise directly  from the aortic arch. Otherwise, conventional configuration of the  aortic arch. There is scattered atherosclerotic plaque involving  the origin of the major branch vessels of the arch, not resulting  in hemodynamically significant stenosis.  Cardiomegaly, in particular, there is apparent enlargement of the  right atrium and ventricle as well as the left atrium.  Coronary  artery calcifications. No pericardial effusion. There are  extensive calcifications within the aortic valve leaflets.  Calcifications within the presumed mitral valve annulus.  Although this examination was not tailored for evaluation of the  pulmonary arteries, there are no discrete filling defects within  the central pulmonary arterial tree to the level of the bilateral  segmental pulmonary arteries to suggest central pulmonary embolism.  The caliber of the main pulmonary artery is enlarged measuring 34  mm in diameter.  -----------------------------------------------  Thoracic aortic measurements:  Sinotubular junction: 31 mm as measured in greatest oblique coronal  dimension.  Proximal ascending aorta: 33 mm as measured  in greatest oblique  axial dimension at the level of the main pulmonary artery.  Aortic arch aorta: 33 mm as measured in greatest oblique sagittal  dimension.  Proximal descending thoracic aorta: 31 mm as measured in greatest  oblique axial dimension at the level of the main pulmonary artery.  Distal descending thoracic aorta: 29 mm as measured in greatest  oblique axial dimension at the level of the diaphragmatic hiatus.  Review of the MIP images confirms the above findings.  -----------------------------------------------------  Nonvascular findings:  Mild centrilobular emphysematous change. Note is made of an  approximately 5 mm noncalcified ground-glass nodule within the  subpleural aspect of the right lower lobe (image 33, series seven).  There is minimal dependent subsegmental atelectasis. There is a  minimal amount of subpleural linear heterogeneous opacities within  the subpleural aspect of the right middle lobe adjacent to the  cerclage wires involving the anterior aspect of the right fourth  ribs, presumably iatrogenic. The central pulmonary airways are  widely patent.  Scattered shoddy mediastinal lymph nodes with index left-sided  paratracheal lymph node measuring 1.1 cm in short axis diameter  (image 32, series six). Index precarinal lymph node measures  approximately 1.1 cm (image 35). Scattered shoddy prevascular  lymph nodes are individually not enlarged by size criteria. No  axillary lymphadenopathy. Normal appearance of the thyroid gland.  No acute or aggressive osseous abnormalities. Mild DDD of the  lower thoracic spine, worse at T10 - T11. Mild compression  deformity (<25%) involving the T6 vertebral body without associated  fracture line.  IMPRESSION:  1. Scattered atherosclerotic plaque within a normal caliber  thoracic aorta. No evidence of thoracic aortic aneurysm or  dissection.  2. Extensive calcifications within the aortic valve leaflets  compatible with  provided history of aortic stenosis.  3. Cardiomegaly, including enlargement the caliber of the main  pulmonary artery, nonspecific but could be seen in the setting of  pulmonary arterial hypertension. Further evaluation with cardiac  echo mid formed as clinically indicated.  4. Coronary artery calcifications.  5. Indeterminate approximately 5 mm ground-glass nodule in the  subpleural aspect the right lower lobe. Given risk factors for  bronchogenic carcinoma, follow-up chest CT at 6 - 12 months is  recommended. This recommendation follows the consensus statement:  Guidelines for Management of Small Pulmonary Nodules Detected on CT  Scans: A Statement from the Fleischner Society as published in  Radiology 2005; 237:395-400. Note, as this nodules primarily  ground glass, surveillance imaging greater than 2 years may be  indicated.  6. Postsurgical change involving the anterior aspect of the right  fourth rib with associated adjacent iatrogenic architectural  distortion within the subpleural aspect of the right middle lobe.  7. Nonspecific shoddy mediastinal adenopathy, possibly reactive in  etiology.  -----------------------------------------------------  CTA ABDOMEN AND PELVIS  Vascular Findings:  Abdominal aorta:  There is scattered mixed calcified and  noncalcified atherosclerotic plaque within normal caliber abdominal  aorta measuring approximately 2 cm in greatest oblique short axis  axial dimension (image 124, series six). No evidence of thoracic  aortic abdominal aortic dissection or periaortic stranding.  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 Conventional  branching pattern.  SMA: There is a mixture of calcified noncalcified plaque involving  origin of proximal aspect of the SMA, not resulting in  hemodynamically significant stenosis. Incidental note is made of a  replaced right sided hepatic artery which arises from the proximal  SMA.  Right renal artery: Solitary; there is a mixture of calcified and  noncalcified plaque involving the origin and proximal aspect of the  right renal artery, not resulting in hemodynamically significant  stenosis.  Left renal artery: Solitary; widely patent without hemodynamically  significant narrowing.  IMA: Widely patent.  Pelvic vasculature: There is very mild ectasia of the distal  aspect of the left common iliac artery measuring approximately 1.5  cm in greatest short axis axial dimension (image 167, series six).  The right common iliac artery is a normal caliber. There is  scattered primarily eccentric calcified plaque within the bilateral  common iliac arteries, not resulting in medically significant  stenosis. The bilateral external iliac arteries to be tortuous  widely patent and free of hemodynamically significant narrowing.  The bilateral internal iliac arteries are diseased proximally with  patent and of normal caliber.  Review of the MIP images confirms the above findings.  ------------------------------------------------------------  Nonvascular findings:  Evaluation of the abdominal organs is limited to the arterial phase  of enhancement.  Normal hepatic contour. Note is made of a sub centimeter  (approximately 8 mm) hypoattenuating lesion within the caudal  aspect of the right lobe of the liver (image 124, series six which  is too small to accurate characterize of favored to represent a  hepatic cyst. There are additional sub centimeter hypoattenuating  lesions within the dome of the left lobe of liver (image 92, series  6 as well as the caudal aspect of the medial  segment left lobe of  the liver (image 21, series six) which are also too small to  accurate characterize but also favored to represent additional  hepatic cysts. No discrete hyperenhancing hepatic lesions. There  is made of an approximately 0.9 x 1.2 cm gallstone lie dependently  within the fundus was otherwise normal appearing gallbladder. No  gallbladder wall thickening or pericholecystic fluid. No definite  intra or extra panic biliary ductal dilatation. No ascites.  There is symmetric enhancement the bilateral kidneys. Note is made  of an approximately 3.7 x 5.0 cm exophytic cyst arising from the  superior pole left kidney which measures approximately 18 HU (image  87, series six). There is an approximately 3.1 x 3.0 cm  hypoattenuating (19 HU) left-sided parapelvic cyst (image 21,  series 3). Note is made of an approximately 1.5 cm hypoattenuating  lesion within the caudal aspect of the left kidney (image 29,  series six) which measures approximately 28 HU and thus cannot be  characterized as a simple renal cyst. Additional bilateral sub  centimeter hypoattenuating lesions  are too small to accurately  characterize. No definite renal stones on the post contrast  examination. No urinary obstruction. There is a very minimal  amount of grossly symmetric likely dilated perinephric stranding.  Normal appearance of the bilateral adrenal glands, pancreas and  spleen.  Hiatal hernia. Moderate colonic stool burden. The sigmoid colon  is noted to be redundant. Normal appearance of the appendix. No  evidence of enteric obstruction. No pneumoperitoneum, pneumatosis  or portal venous gas. Scattered shoddy retroperitoneal lymph nodes  are individually not enlarged by size criteria with index  aortocaval lymph node measuring approximately 0.8 cm in short axis  diameter ( image 115, series six). No definite retroperitoneal,  mesenteric, pelvic or inguinal lymphadenopathy.  The prostate is  enlarged with mass effect upon the undersurface of  the bladder. There is possible mild diffuse thickening of the  urinary bladder wall, possibly accentuated due to under distension.  Incidental note is made of a moderate sized right-sided hydrocele.  No acute or aggressive osseous abnormalities. Bilateral facet  degenerative change of the lower lumbar spine. Small mesenteric  fat containing periumbilical hernia. Small mesenteric fat  containing right-sided inguinal hernia.  IMPRESSION:  1. Scattered mixed calcified and noncalcified atherosclerotic  plaque within the normal caliber abdominal aorta. No evidence of  abdominal aortic aneurysm or dissection.  2. 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  3. Enlarged prostate with possible diffuse thickening of the  urinary bladder wall, possibly accentuated due to under distension  but could be indicative of bladder outlet obstruction.  4. Incidental note made of a moderate sized right-sided hydrocele.  5. There is an indeterminate approximately 1.5 cm left-sided renal  lesion which cannot be characterized as simple renal cyst. Further  evaluation of abdominal MRI may be performed as clinically  indicated.  6. Additional bilateral renal cysts as above.  7. Cholelithiasis without evidence of cholecystitis.  Original Report Authenticated By: Tacey Ruiz, MD    Impression:  The patient has severe symptomatic aortic stenosis with preserved left ventricular systolic function. Transthoracic echocardiogram is notable for the presence of at least moderate mitral regurgitation, and right heart catheterization is notable for significantly elevated pulmonary artery pressures with large V waves on pulmonary capillary wedge tracing. Attempts to perform  transesophageal echocardiogram were aborted because of difficulty cannulating the esophagus under light sedation in the endoscopy suite. The patient does not have any history of dysphagia although he does have history of hiatal hernia.  No significant abnormalities of the esophagus were noted on CT scan of the chest.    Plan:  We will obtain upper GI barium contrast study to rule out the possibility of esophageal stricture which might complicate subsequent attempts at transesophageal echocardiogram, either preoperatively or intraoperatively. The patient will return in one week for further followup.   Salvatore Decent. Cornelius Moras, MD 07/24/2013 4:44 PM

## 2013-07-27 ENCOUNTER — Ambulatory Visit (INDEPENDENT_AMBULATORY_CARE_PROVIDER_SITE_OTHER): Payer: Medicare Other | Admitting: Pharmacist

## 2013-07-27 DIAGNOSIS — I4891 Unspecified atrial fibrillation: Secondary | ICD-10-CM

## 2013-07-27 LAB — POCT INR: INR: 2.7

## 2013-07-28 ENCOUNTER — Encounter: Payer: Self-pay | Admitting: Cardiology

## 2013-07-28 ENCOUNTER — Ambulatory Visit
Admission: RE | Admit: 2013-07-28 | Discharge: 2013-07-28 | Disposition: A | Discharge status: Home / Self Care | Payer: Medicare Other | Source: Ambulatory Visit | Attending: Thoracic Surgery (Cardiothoracic Vascular Surgery) | Admitting: Thoracic Surgery (Cardiothoracic Vascular Surgery)

## 2013-07-28 DIAGNOSIS — R1310 Dysphagia, unspecified: Secondary | ICD-10-CM

## 2013-07-28 DIAGNOSIS — K449 Diaphragmatic hernia without obstruction or gangrene: Secondary | ICD-10-CM | POA: Diagnosis not present

## 2013-07-31 ENCOUNTER — Ambulatory Visit: Payer: Medicare Other | Admitting: Thoracic Surgery (Cardiothoracic Vascular Surgery)

## 2013-07-31 VITALS — Ht 68.0 in | Wt 160.0 lb

## 2013-08-01 NOTE — Progress Notes (Signed)
This encounter was created in error - please disregard.

## 2013-08-02 ENCOUNTER — Encounter (HOSPITAL_COMMUNITY): Payer: Self-pay

## 2013-08-02 ENCOUNTER — Encounter (HOSPITAL_COMMUNITY)
Admission: RE | Disposition: A | Discharge status: Home / Self Care | Payer: Self-pay | Source: Ambulatory Visit | Attending: Interventional Cardiology

## 2013-08-02 ENCOUNTER — Encounter (HOSPITAL_COMMUNITY): Payer: Self-pay | Admitting: Gastroenterology

## 2013-08-02 ENCOUNTER — Ambulatory Visit (HOSPITAL_COMMUNITY)
Admission: RE | Admit: 2013-08-02 | Discharge: 2013-08-02 | Disposition: A | Discharge status: Home / Self Care | Payer: Medicare Other | Source: Ambulatory Visit | Attending: Interventional Cardiology | Admitting: Interventional Cardiology

## 2013-08-02 ENCOUNTER — Ambulatory Visit (HOSPITAL_COMMUNITY): Admit: 2013-08-02 | Payer: Self-pay | Admitting: Gastroenterology

## 2013-08-02 DIAGNOSIS — Z5309 Procedure and treatment not carried out because of other contraindication: Secondary | ICD-10-CM | POA: Diagnosis not present

## 2013-08-02 DIAGNOSIS — I080 Rheumatic disorders of both mitral and aortic valves: Secondary | ICD-10-CM | POA: Diagnosis not present

## 2013-08-02 DIAGNOSIS — I4891 Unspecified atrial fibrillation: Secondary | ICD-10-CM | POA: Insufficient documentation

## 2013-08-02 HISTORY — PX: TEE WITHOUT CARDIOVERSION: SHX5443

## 2013-08-02 HISTORY — PX: ESOPHAGOGASTRODUODENOSCOPY: SHX5428

## 2013-08-02 SURGERY — ECHOCARDIOGRAM, TRANSESOPHAGEAL
Anesthesia: Moderate Sedation

## 2013-08-02 SURGERY — EGD (ESOPHAGOGASTRODUODENOSCOPY)
Anesthesia: Moderate Sedation

## 2013-08-02 MED ORDER — MIDAZOLAM HCL 10 MG/2ML IJ SOLN
INTRAMUSCULAR | Status: DC | PRN
  Administered 2013-08-02 (×5): 2 mg via INTRAVENOUS
  Administered 2013-08-02: 1 mg via INTRAVENOUS

## 2013-08-02 MED ORDER — MIDAZOLAM HCL 5 MG/ML IJ SOLN
INTRAMUSCULAR | Status: AC
  Filled 2013-08-02: qty 3

## 2013-08-02 MED ORDER — DIPHENHYDRAMINE HCL 50 MG/ML IJ SOLN
INTRAMUSCULAR | Status: AC
  Filled 2013-08-02: qty 1

## 2013-08-02 MED ORDER — BUTAMBEN-TETRACAINE-BENZOCAINE 2-2-14 % EX AERO
INHALATION_SPRAY | CUTANEOUS | Status: DC | PRN
  Administered 2013-08-02: 2 via TOPICAL

## 2013-08-02 MED ORDER — SODIUM CHLORIDE 0.9 % IV SOLN
INTRAVENOUS | Status: DC
  Administered 2013-08-02: 500 mL via INTRAVENOUS

## 2013-08-02 MED ORDER — FENTANYL CITRATE 0.05 MG/ML IJ SOLN
INTRAMUSCULAR | Status: DC | PRN
  Administered 2013-08-02 (×5): 25 ug via INTRAVENOUS

## 2013-08-02 MED ORDER — FENTANYL CITRATE 0.05 MG/ML IJ SOLN
INTRAMUSCULAR | Status: AC
  Filled 2013-08-02: qty 4

## 2013-08-02 MED ORDER — FENTANYL CITRATE 0.05 MG/ML IJ SOLN
INTRAMUSCULAR | Status: AC
  Filled 2013-08-02: qty 2

## 2013-08-02 MED ORDER — MIDAZOLAM HCL 5 MG/ML IJ SOLN
INTRAMUSCULAR | Status: AC
  Filled 2013-08-02: qty 2

## 2013-08-02 MED ORDER — LIDOCAINE VISCOUS 2 % MT SOLN
OROMUCOSAL | Status: AC
  Filled 2013-08-02: qty 15

## 2013-08-02 MED ORDER — LIDOCAINE VISCOUS 2 % MT SOLN
OROMUCOSAL | Status: DC | PRN
  Administered 2013-08-02: 20 mL via OROMUCOSAL

## 2013-08-02 NOTE — CV Procedure (Addendum)
TEE attempted.  Unable to pass probe.  Attempt also made by Dr. Randa Evens from GI.  He was unable to pass the probe as well.  Blood tinged secretions initially cleared after repeat suction.  Patient reports no chest pain.  Discussed with Dr. Randa Evens.  Will plan for EGD to evaluate for possible obstruction.   Due to scheduling issues, will also discuss with Dr. Dulce Sellar, as he is in the hospital today.  Discussed situation with the patient's wife.  Will continue to watch the patient in GI recovery area.

## 2013-08-02 NOTE — Interval H&P Note (Signed)
History and Physical Interval Note:  08/02/2013 8:19 AM  [place] [name]  has presented today for surgery, with the diagnosis of AORTIC VALVE, MITRAL VALVE, AFIB  The various methods of treatment have been discussed with the patient and family. After consideration of risks, benefits and other options for treatment, the patient has consented to  Procedure(s): TRANSESOPHAGEAL ECHOCARDIOGRAM (TEE) (N/A) as a surgical intervention .  The patient's history has been reviewed, patient examined, no change in status, stable for surgery.  I have reviewed the patient's chart and labs.  Questions were answered to the patient's satisfaction.     [name] S.

## 2013-08-02 NOTE — CV Procedure (Signed)
Another attempt at TEE made today. This time, Dr. Dulce Sellar performed EGD and did not have trouble passing the much smaller endoscope into the esophagus. He then tried to pass the TEE probe but was unsuccessful.  We then tried to work together where it Dr. Dulce Sellar had the scope visualizing the entrance to the esophagus and I tried to direct the TEE probe into the esophagus. Although it appeared that the tip of the probe was in the right place, the probe would not pass. Multiple attempts were made. He tried to pass the TEE probe with visualization as well without success. There is no evidence of hematoma or perforation. There was some swelling noted in the tissue just above the entrance to the esophagus and we elected to stop.  Will discuss with Dr. Cornelius Moras regarding plans for future cardiac surgery.

## 2013-08-02 NOTE — Progress Notes (Signed)
Echocardiogram Echocardiogram Transesophageal was attempted a second time with out success  ReelGenene Churn 08/02/2013, 3:57 PM

## 2013-08-02 NOTE — H&P (View-Only) (Signed)
  Date of Initial H&P: 06/30/13  History reviewed, patient examined, no change in status, stable for surgery.

## 2013-08-03 ENCOUNTER — Encounter (HOSPITAL_COMMUNITY): Payer: Self-pay | Admitting: Interventional Cardiology

## 2013-08-07 ENCOUNTER — Encounter: Payer: Self-pay | Admitting: Thoracic Surgery (Cardiothoracic Vascular Surgery)

## 2013-08-07 ENCOUNTER — Other Ambulatory Visit: Payer: Self-pay | Admitting: *Deleted

## 2013-08-07 ENCOUNTER — Other Ambulatory Visit: Payer: Self-pay | Admitting: Thoracic Surgery (Cardiothoracic Vascular Surgery)

## 2013-08-07 ENCOUNTER — Ambulatory Visit (INDEPENDENT_AMBULATORY_CARE_PROVIDER_SITE_OTHER): Payer: Medicare Other | Admitting: Thoracic Surgery (Cardiothoracic Vascular Surgery)

## 2013-08-07 VITALS — BP 125/80 | HR 71 | Resp 19 | Ht 68.0 in | Wt 163.0 lb

## 2013-08-07 DIAGNOSIS — I071 Rheumatic tricuspid insufficiency: Secondary | ICD-10-CM

## 2013-08-07 DIAGNOSIS — I4891 Unspecified atrial fibrillation: Secondary | ICD-10-CM | POA: Diagnosis not present

## 2013-08-07 DIAGNOSIS — I059 Rheumatic mitral valve disease, unspecified: Secondary | ICD-10-CM

## 2013-08-07 DIAGNOSIS — I079 Rheumatic tricuspid valve disease, unspecified: Secondary | ICD-10-CM | POA: Diagnosis not present

## 2013-08-07 DIAGNOSIS — I350 Nonrheumatic aortic (valve) stenosis: Secondary | ICD-10-CM

## 2013-08-07 DIAGNOSIS — I359 Nonrheumatic aortic valve disorder, unspecified: Secondary | ICD-10-CM

## 2013-08-07 DIAGNOSIS — I340 Nonrheumatic mitral (valve) insufficiency: Secondary | ICD-10-CM

## 2013-08-07 DIAGNOSIS — I4819 Other persistent atrial fibrillation: Secondary | ICD-10-CM

## 2013-08-07 MED ORDER — AMIODARONE HCL 200 MG PO TABS: 200.0000 mg | ORAL_TABLET | Freq: Two times a day (BID) | ORAL | Status: DC

## 2013-08-07 NOTE — Progress Notes (Signed)
[address]       [name] [address]             [phone]     CARDIOTHORACIC SURGERY OFFICE NOTE  Referring Provider is Everette Rank, MD PCP is Johny Blamer, MD   HPI:  Patient returns for followup of severe symptomatic aortic stenosis, mitral regurgitation, atrial fibrillation, and tricuspid regurgitation. He was originally seen in consultation 06/26/2013.  Since then he underwent left and right heart catheterization and attempted transesophageal echocardiogram by Dr. Eldridge Dace on 07/05/2013. Transesophageal echocardiogram was aborted because of difficulty passing the scope into the esophagus. Cardiac catheterization confirmed the presence of severe aortic stenosis and was notable for the absence of significant coronary artery disease. Pulmonary artery pressures were elevated and associated with significant V waves on wedge tracing, suggestive of the presence of severe mitral regurgitation.  I last saw him here in the office 07/24/2013.  Since then we performed an upper GI barium swallow which was entirely normal with the exception of a very small sliding type hiatal hernia. Last week the patient underwent another attempt at transesophageal echocardiogram by Dr. Eldridge Dace.  However, again Dr. Eldridge Dace experienced difficulty with attempts to intubate the esophagus using the TEE probe.  At that time the patient was seen in consultation by Dr. Dulce Sellar and upper GI endoscopy was performed. This exam was completely normal and the endoscopy probe passed into the esophagus without any difficulty. It was suspected that problems passing the TEE probe may have been related to angulation in the neck associated with the patient's cervical spine and arthritis.  The patient returns to the office to discuss matters further. He reports that he remains clinically stable, although over the past month he feels as though he continues to experience progressive symptoms of exertional shortness  of breath. He is now getting short of breath with relatively mild activity. He has not had resting shortness of breath. He is eager to proceed with surgery in the near future.    Current Outpatient Prescriptions  Medication Sig Dispense Refill  . acetaminophen (TYLENOL) 650 MG CR tablet Take 650 mg by mouth every 8 (eight) hours as needed for pain.      Marland Kitchen atorvastatin (LIPITOR) 20 MG tablet Take 20 mg by mouth every evening.       Marland Kitchen b complex vitamins tablet Take 1 tablet by mouth daily.      Marland Kitchen CARTIA XT 240 MG 24 hr capsule Take 240 mg by mouth every evening.       . furosemide (LASIX) 40 MG tablet Take 40 mg by mouth 2 (two) times daily.       Marland Kitchen glucosamine-chondroitin 500-400 MG tablet Take 1 tablet by mouth 2 (two) times daily.       Marland Kitchen lisinopril (PRINIVIL,ZESTRIL) 20 MG tablet Take 10 mg by mouth daily.       . Multiple Vitamin (MULTIVITAMIN) capsule Take 1 capsule by mouth daily.      . Multiple Vitamins-Minerals (PRESERVISION AREDS PO) Take by mouth 2 (two) times daily.      . vitamin B-12 (CYANOCOBALAMIN) 1000 MCG tablet Take 1,000 mcg by mouth daily.      Marland Kitchen warfarin (COUMADIN) 5 MG tablet Take 5-7.5 mg by mouth See admin instructions. 5mg  on Monday and Friday; 7.5mg  Sunday, Tuesday, Wednesday, Thursday, Saturday       No current facility-administered medications for this visit.      Physical Exam:   BP 125/80  Pulse 71  Resp 19  Ht 5\' 8"  (1.727 m)  Wt 163 lb (73.936 kg)  BMI 24.79 kg/m2  SpO2 97%  General:  Well-appearing  Chest:   Clear to auscultation  CV:   Irregular rate and rhythm with prominent systolic murmurs  Incisions:  n/a  Abdomen:  Soft and nontender  Extremities:  Warm and well-perfused with no lower extremity edema  Diagnostic Tests:  UPPER GI SERIES W/HIGH DENSITY W/KUB  TECHNIQUE:  After obtaining a scout radiograph a routine upper GI series was  performed using thin and high density barium.  COMPARISON: CT scan of the chest dated 07/24/2013    FLUOROSCOPY TIME: 2 min 12 seconds  FINDINGS:  The bowel gas pattern is normal. Extensive calcification in the  abdominal aorta and common iliac arteries.  The mucosa and motility of the esophagus are normal. There is a  small sliding hiatal hernia. No stricture. A 13 mm barium tablet  passed immediately from the mouth to the stomach with no delay.  The fundus, body, and antrum of the stomach are normal. Pylorus,  duodenal bulb, and C-loop are normal.  IMPRESSION:  Small sliding hiatal hernia. Otherwise, normal exam.  Electronically Signed  By: Geanie Cooley M.D.  On: 07/28/2013 09:02     Impression:  The patient has severe calcific aortic stenosis with gradual progression of symptoms of exertional shortness of breath, functional class II-III. Left ventricular systolic function appears preserved. The patient does appear to have significant diastolic dysfunction as well as at least moderate residual mitral regurgitation s/p mitral valve repair in 2005.  He also has moderate tricuspid regurgitation and chronic persistent atrial fibrillation.  Attempts at preoperative transesophageal echocardiogram to better assess the functional pathology and severity of recurrent mitral regurgitation have been aborted do to inability to cannulate the esophagus with aTEE scope. However, upper GI barium contrast study and upper GI endoscopy both appear normal and without sign of significant stricture or esophageal pathology.     Plan:  I have discussed the indications, risks, and potential benefits of surgical intervention with the patient and his wife in the office today.  The risks and benefits of surgical intervention has been discussed in detail.  Long-term prognosis with medical therapy was discussed. Alternative approaches such as conventional surgical aortic valve replacement with or without concomitant redo mitral valve repair/replacement, transcatheter aortic valve replacement, and palliative medical  therapy were compared and contrasted at length. This discussion was placed in the context of the patient's own specific clinical presentation and past medical history.  All of their questions been addressed.  Discussion was held comparing the relative risks of mechanical valve replacement with need for lifelong anticoagulation versus use of a bioprosthetic tissue valve and the associated potential for late structural valve deterioration in failure.  This discussion was placed in the context of the patient's particular circumstances, and as a result the patient specifically requests that their valve be replaced using bioprosthetic tissue valves.  The patient understands and accepts all potential associated risks of surgery including but not limited to risk of death, stroke, myocardial infarction, congestive heart failure, respiratory failure, renal failure, pneumonia, bleeding requiring blood transfusion and or reexploration, arrhythmia, heart block or bradycardia requiring permanent pacemaker, aortic dissection or other major vascular complication, pleural effusions or other delayed complications related to continued congestive heart failure, and other late complications related to valve replacement including structural valve deterioration and failure, thrombosis, endocarditis, or paravalvular leak.  We plan to proceed with surgery on  Tuesday, 08/15/2013 for aortic valve replacement, possible redo mitral valve repair or replacement, tricuspid valve repair and possible maze procedure. We will try to make arrangements for a pediatric sized TEE probe to be available in the event that placement of a conventional TEE probe cannot be performed while the patient is under general anesthesia.  The patient has been instructed to stop taking Coumadin and begin taking amiodarone in anticipation of surgery. All of his questions been addressed.      Salvatore Decent. Cornelius Moras, MD 08/07/2013 2:34 PM

## 2013-08-07 NOTE — Patient Instructions (Addendum)
Stop taking Coumadin (warfarin) and begin taking amiodarone  Continue all other medications without change  On the morning of surgery take only amiodarone with a sip of water

## 2013-08-08 ENCOUNTER — Other Ambulatory Visit: Payer: Self-pay | Admitting: *Deleted

## 2013-08-08 DIAGNOSIS — I059 Rheumatic mitral valve disease, unspecified: Secondary | ICD-10-CM

## 2013-08-08 MED ORDER — GLUTARALDEHYDE 0.625% SOAKING SOLUTION: TOPICAL | Status: DC | PRN

## 2013-08-09 DIAGNOSIS — H353290 Exudative age-related macular degeneration, unspecified eye, stage unspecified: Secondary | ICD-10-CM | POA: Diagnosis not present

## 2013-08-09 DIAGNOSIS — H35359 Cystoid macular degeneration, unspecified eye: Secondary | ICD-10-CM | POA: Diagnosis not present

## 2013-08-10 ENCOUNTER — Encounter (HOSPITAL_COMMUNITY): Payer: Self-pay

## 2013-08-11 ENCOUNTER — Encounter (HOSPITAL_COMMUNITY)
Admission: RE | Admit: 2013-08-11 | Discharge: 2013-08-11 | Disposition: A | Discharge status: Home / Self Care | Payer: Medicare Other | Source: Ambulatory Visit | Attending: Thoracic Surgery (Cardiothoracic Vascular Surgery) | Admitting: Thoracic Surgery (Cardiothoracic Vascular Surgery)

## 2013-08-11 ENCOUNTER — Encounter (HOSPITAL_COMMUNITY): Payer: Self-pay

## 2013-08-11 ENCOUNTER — Ambulatory Visit (HOSPITAL_COMMUNITY)
Admission: RE | Admit: 2013-08-11 | Discharge: 2013-08-11 | Disposition: A | Discharge status: Home / Self Care | Payer: Medicare Other | Source: Ambulatory Visit | Attending: Thoracic Surgery (Cardiothoracic Vascular Surgery) | Admitting: Thoracic Surgery (Cardiothoracic Vascular Surgery)

## 2013-08-11 VITALS — BP 144/74 | HR 77 | Temp 98.0°F | Resp 18 | Ht 68.0 in | Wt 172.5 lb

## 2013-08-11 DIAGNOSIS — Z01812 Encounter for preprocedural laboratory examination: Secondary | ICD-10-CM | POA: Diagnosis not present

## 2013-08-11 DIAGNOSIS — M899 Disorder of bone, unspecified: Secondary | ICD-10-CM | POA: Diagnosis not present

## 2013-08-11 DIAGNOSIS — I4891 Unspecified atrial fibrillation: Secondary | ICD-10-CM | POA: Insufficient documentation

## 2013-08-11 DIAGNOSIS — R0602 Shortness of breath: Secondary | ICD-10-CM | POA: Diagnosis not present

## 2013-08-11 DIAGNOSIS — Z01811 Encounter for preprocedural respiratory examination: Secondary | ICD-10-CM | POA: Insufficient documentation

## 2013-08-11 DIAGNOSIS — I700 Atherosclerosis of aorta: Secondary | ICD-10-CM | POA: Insufficient documentation

## 2013-08-11 DIAGNOSIS — Z01810 Encounter for preprocedural cardiovascular examination: Secondary | ICD-10-CM

## 2013-08-11 DIAGNOSIS — Z01818 Encounter for other preprocedural examination: Secondary | ICD-10-CM | POA: Diagnosis not present

## 2013-08-11 DIAGNOSIS — Z9889 Other specified postprocedural states: Secondary | ICD-10-CM

## 2013-08-11 DIAGNOSIS — I350 Nonrheumatic aortic (valve) stenosis: Secondary | ICD-10-CM

## 2013-08-11 DIAGNOSIS — I079 Rheumatic tricuspid valve disease, unspecified: Secondary | ICD-10-CM

## 2013-08-11 DIAGNOSIS — I359 Nonrheumatic aortic valve disorder, unspecified: Secondary | ICD-10-CM

## 2013-08-11 DIAGNOSIS — R9431 Abnormal electrocardiogram [ECG] [EKG]: Secondary | ICD-10-CM | POA: Diagnosis not present

## 2013-08-11 DIAGNOSIS — I340 Nonrheumatic mitral (valve) insufficiency: Secondary | ICD-10-CM

## 2013-08-11 DIAGNOSIS — I059 Rheumatic mitral valve disease, unspecified: Secondary | ICD-10-CM

## 2013-08-11 DIAGNOSIS — I4460 Unspecified fascicular block: Secondary | ICD-10-CM | POA: Diagnosis not present

## 2013-08-11 DIAGNOSIS — I517 Cardiomegaly: Secondary | ICD-10-CM | POA: Diagnosis not present

## 2013-08-11 HISTORY — DX: Personal history of other diseases of the digestive system: Z87.19

## 2013-08-11 HISTORY — DX: Gastro-esophageal reflux disease without esophagitis: K21.9

## 2013-08-11 LAB — BLOOD GAS, ARTERIAL
Acid-base deficit: 0.8 mmol/L (ref 0.0–2.0)
Bicarbonate: 22.8 mEq/L (ref 20.0–24.0)
Drawn by: 344381
FIO2: 0.21 %
O2 Saturation: 95 %
Patient temperature: 98.6
TCO2: 23.9 mmol/L (ref 0–100)
pCO2 arterial: 34.6 mmHg — ABNORMAL LOW (ref 35.0–45.0)
pH, Arterial: 7.435 (ref 7.350–7.450)
pO2, Arterial: 73.2 mmHg — ABNORMAL LOW (ref 80.0–100.0)

## 2013-08-11 LAB — URINALYSIS, ROUTINE W REFLEX MICROSCOPIC
Bilirubin Urine: NEGATIVE
Glucose, UA: NEGATIVE mg/dL
Hgb urine dipstick: NEGATIVE
Ketones, ur: NEGATIVE mg/dL
Leukocytes, UA: NEGATIVE
Nitrite: NEGATIVE
Protein, ur: NEGATIVE mg/dL
Specific Gravity, Urine: 1.014 (ref 1.005–1.030)
Urobilinogen, UA: 0.2 mg/dL (ref 0.0–1.0)
pH: 5 (ref 5.0–8.0)

## 2013-08-11 LAB — HEMOGLOBIN A1C
Hgb A1c MFr Bld: 5.4 % (ref <5.7)
Mean Plasma Glucose: 108 mg/dL (ref <117)

## 2013-08-11 LAB — COMPREHENSIVE METABOLIC PANEL
ALT: 23 U/L (ref 0–53)
AST: 34 U/L (ref 0–37)
Albumin: 4.6 g/dL (ref 3.5–5.2)
Alkaline Phosphatase: 93 U/L (ref 39–117)
BUN: 16 mg/dL (ref 6–23)
CO2: 25 mEq/L (ref 19–32)
Calcium: 9.8 mg/dL (ref 8.4–10.5)
Chloride: 103 mEq/L (ref 96–112)
Creatinine, Ser: 1.13 mg/dL (ref 0.50–1.35)
GFR calc Af Amer: 70 mL/min — ABNORMAL LOW (ref >90)
GFR calc non Af Amer: 60 mL/min — ABNORMAL LOW (ref >90)
Glucose, Bld: 99 mg/dL (ref 70–99)
Potassium: 4.3 mEq/L (ref 3.5–5.1)
Sodium: 140 mEq/L (ref 135–145)
Total Bilirubin: 1.2 mg/dL (ref 0.3–1.2)
Total Protein: 7.3 g/dL (ref 6.0–8.3)

## 2013-08-11 LAB — PULMONARY FUNCTION TEST

## 2013-08-11 LAB — CBC
HCT: 39.5 % (ref 39.0–52.0)
Hemoglobin: 13.3 g/dL (ref 13.0–17.0)
MCH: 31.1 pg (ref 26.0–34.0)
MCHC: 33.7 g/dL (ref 30.0–36.0)
MCV: 92.5 fL (ref 78.0–100.0)
Platelets: 165 10*3/uL (ref 150–400)
RBC: 4.27 MIL/uL (ref 4.22–5.81)
RDW: 14 % (ref 11.5–15.5)
WBC: 7.6 10*3/uL (ref 4.0–10.5)

## 2013-08-11 LAB — PROTIME-INR
INR: 1.29 (ref 0.00–1.49)
Prothrombin Time: 15.8 seconds — ABNORMAL HIGH (ref 11.6–15.2)

## 2013-08-11 LAB — SURGICAL PCR SCREEN
MRSA, PCR: NEGATIVE
Staphylococcus aureus: NEGATIVE

## 2013-08-11 LAB — APTT: aPTT: 30 seconds (ref 24–37)

## 2013-08-11 LAB — ABO/RH: ABO/RH(D): B NEG

## 2013-08-11 MED ORDER — ALBUTEROL SULFATE (5 MG/ML) 0.5% IN NEBU
2.5000 mg | INHALATION_SOLUTION | Freq: Once | RESPIRATORY_TRACT | Status: AC
  Administered 2013-08-11: 2.5 mg via RESPIRATORY_TRACT

## 2013-08-11 NOTE — Pre-Procedure Instructions (Signed)
LEXANDER TREMBLAY  08/11/2013   Your procedure is scheduled on:  Tuesday, November 4th.  Report to [facility], Main Entrance/ Entrance "A"at 5:30 AM.  Call this number if you have problems the morning of surgery: [phone]   Remember:   Do not eat food or drink liquids after midnight.   Take these medicines the morning of surgery with A SIP OF WATER: amiodarone (PACERONE), acetaminophen (TYLENOL). Stop taking Aspirin, Coumadin, Plavix, Effient and herbal medications.  Do not take any NSAIDS ie:  Ibuprofen, Advil, Naproxen.   Do not wear jewelry, make-up or nail polish.  Do not wear lotions, powders, or perfumes. You may wear deodorant.  Do not shave 48 hours prior to surgery. Men may shave face and neck.  Do not bring valuables to the hospital.  [facility] is not responsible  for any belongings or valuables.               Contacts, dentures or bridgework may not be worn into surgery.  Leave suitcase in the car. After surgery it may be brought to your room.  For patients admitted to the hospital, discharge time is determined by your treatment team.                 Special Instructions: Shower using CHG 2 nights before surgery and the night before surgery.  If you shower the day of surgery use CHG.  Use special wash - you have one bottle of CHG for all showers.  You should use approximately 1/3 of the bottle for each shower.   Please read over the following fact sheets that you were given: Pain Booklet, Coughing and Deep Breathing, Blood Transfusion Information and Surgical Site Infection Prevention

## 2013-08-11 NOTE — Progress Notes (Signed)
Pre-op Cardiac Surgery  Carotid Findings:  Findings suggest 1-39% internal carotid artery stenosis bilaterally. Vertebral arteries are patent with antegrade flow.   Upper Extremity Right Left  Brachial Pressures 117-Triphasic 133-Triphasic  Radial Waveforms Triphasic Triphasic  Ulnar Waveforms Triphasic Triphasic  Palmar Arch (Allen's Test) Signal obliterates with both radial and ulnar compression. Signal is unaffected with radial compression, obliterates with ulnar compression.    Gertie Fey, RVT, RDCS, RDMS 08/11/2013

## 2013-08-12 DIAGNOSIS — I350 Nonrheumatic aortic (valve) stenosis: Secondary | ICD-10-CM

## 2013-08-12 HISTORY — DX: Nonrheumatic aortic (valve) stenosis: I35.0

## 2013-08-14 MED ORDER — SODIUM CHLORIDE 0.9 % IV SOLN
INTRAVENOUS | Status: AC
  Administered 2013-08-15: 1.3 [IU]/h via INTRAVENOUS
  Filled 2013-08-14: qty 1

## 2013-08-14 MED ORDER — PLASMA-LYTE 148 IV SOLN
INTRAVENOUS | Status: AC
  Administered 2013-08-15: 10:00:00
  Filled 2013-08-14 (×2): qty 2.5

## 2013-08-14 MED ORDER — AMINOCAPROIC ACID 250 MG/ML IV SOLN
INTRAVENOUS | Status: AC
  Administered 2013-08-15: 12:00:00 via INTRAVENOUS
  Administered 2013-08-15: 69.8 mL/h via INTRAVENOUS
  Filled 2013-08-14: qty 40

## 2013-08-14 MED ORDER — LEVOFLOXACIN IN D5W 500 MG/100ML IV SOLN
500.0000 mg | INTRAVENOUS | Status: AC
  Administered 2013-08-15: 500 mg via INTRAVENOUS
  Filled 2013-08-14 (×2): qty 100

## 2013-08-14 MED ORDER — DOPAMINE-DEXTROSE 3.2-5 MG/ML-% IV SOLN
2.0000 ug/kg/min | INTRAVENOUS | Status: DC
  Filled 2013-08-14: qty 250

## 2013-08-14 MED ORDER — POTASSIUM CHLORIDE 2 MEQ/ML IV SOLN
80.0000 meq | INTRAVENOUS | Status: DC
  Filled 2013-08-14: qty 40

## 2013-08-14 MED ORDER — SODIUM CHLORIDE 0.9 % IV SOLN
INTRAVENOUS | Status: DC
  Filled 2013-08-14: qty 30

## 2013-08-14 MED ORDER — VANCOMYCIN HCL 10 G IV SOLR
1250.0000 mg | INTRAVENOUS | Status: AC
  Administered 2013-08-15: 1250 mg via INTRAVENOUS
  Filled 2013-08-14: qty 1250

## 2013-08-14 MED ORDER — DEXMEDETOMIDINE HCL IN NACL 400 MCG/100ML IV SOLN
0.1000 ug/kg/h | INTRAVENOUS | Status: AC
  Administered 2013-08-15: 0.3 ug/kg/h via INTRAVENOUS
  Administered 2013-08-15: 15:00:00 via INTRAVENOUS
  Filled 2013-08-14: qty 100

## 2013-08-14 MED ORDER — VANCOMYCIN HCL 1000 MG IV SOLR
INTRAVENOUS | Status: AC
  Administered 2013-08-15: 14:00:00
  Filled 2013-08-14 (×2): qty 1000

## 2013-08-14 MED ORDER — NITROGLYCERIN IN D5W 200-5 MCG/ML-% IV SOLN
2.0000 ug/min | INTRAVENOUS | Status: DC
  Filled 2013-08-14: qty 250

## 2013-08-14 MED ORDER — EPINEPHRINE HCL 1 MG/ML IJ SOLN
0.5000 ug/min | INTRAMUSCULAR | Status: DC
  Filled 2013-08-14: qty 4

## 2013-08-14 MED ORDER — PHENYLEPHRINE HCL 10 MG/ML IJ SOLN
30.0000 ug/min | INTRAVENOUS | Status: AC
  Administered 2013-08-15: 20 ug/min via INTRAVENOUS
  Filled 2013-08-14: qty 2

## 2013-08-14 MED ORDER — CHLORHEXIDINE GLUCONATE 4 % EX LIQD: 30.0000 mL | CUTANEOUS | Status: DC

## 2013-08-14 MED ORDER — MAGNESIUM SULFATE 50 % IJ SOLN
40.0000 meq | INTRAMUSCULAR | Status: DC
Start: 2013-08-15 — End: 2013-08-15
  Filled 2013-08-14: qty 10

## 2013-08-14 NOTE — H&P (Signed)
[address]       [name] [address]             [phone]          CARDIOTHORACIC SURGERY HISTORY AND PHYSICAL EXAM  Referring Provider is Everette Rank, MD PCP is Johny Blamer, MD    Chief Complaint   Patient presents with   .  Aortic Stenosis       Surgical eval for possible TAVR, ECHO 05/29/2013     HPI:  Patient is a 79 year old retired white male from [place] who underwent minimally invasive mitral valve repair in 2005 by Dr. Silvestre Mesi at [facility].  The patient was reportedly left with some residual mitral regurgitation but has done very well clinically until recently. He developed atrial fibrillation several years ago and has been treated with pharmacologic rate control and anticoagulation with Coumadin. Cardiac catheterization performed in 2012 is notable for the absence of significant coronary artery disease but reportedly did demonstrate moderate mitral regurgitation and moderate aortic stenosis.  The pullback gradient across the aortic valve measured at catheterization was estimated 20 mm mercury at that time.  The patient was recently seen in followup by Dr. Eldridge Dace and noted to complain of some chest tightness and exertional shortness of breath which improved with diuretic therapy.  He underwent followup transthoracic echocardiogram 05/29/2013 that revealed significant progression of aortic stenosis with peak velocity across the aortic valve measured greater than 4 m/s corresponding to a mean transvalvular gradient of 40 mm mercury. Left ventricular systolic function appear preserved with ejection fraction estimated 50-55%. There remained "mild to moderate eccentric mitral valve regurgitation" and moderate tricuspid regurgitation with moderately elevated right ventricular systolic pressure. The patient was referred for surgical consultation to discuss treatment options.  He was originally seen in consultation 06/26/2013.  Since  then he underwent left and right heart catheterization and attempted transesophageal echocardiogram by Dr. Eldridge Dace on 07/05/2013. Transesophageal echocardiogram was aborted because of difficulty passing the scope into the esophagus. Cardiac catheterization confirmed the presence of severe aortic stenosis and was notable for the absence of significant coronary artery disease. Pulmonary artery pressures were elevated and associated with significant V waves on wedge tracing, suggestive of the presence of severe mitral regurgitation.  I last saw him here in the office 07/24/2013.  Since then we performed an upper GI barium swallow which was entirely normal with the exception of a very small sliding type hiatal hernia. Last week the patient underwent another attempt at transesophageal echocardiogram by Dr. Eldridge Dace.  However, again Dr. Eldridge Dace experienced difficulty with attempts to intubate the esophagus using the TEE probe.  At that time the patient was seen in consultation by Dr. Dulce Sellar and upper GI endoscopy was performed. This exam was completely normal and the endoscopy probe passed into the esophagus without any difficulty. It was suspected that problems passing the TEE probe may have been related to angulation in the neck associated with the patient's cervical spine and arthritis.  The patient returns to the office to discuss matters further. He reports that he remains clinically stable, although over the past month he feels as though he continues to experience progressive symptoms of exertional shortness of breath. He is now getting short of breath with relatively mild activity. He has not had resting shortness of breath. He is eager to proceed with surgery in the near future.  The patient remains physically active and completely functionally independent. He enjoys playing golf  2 days a week. He has mild exertional shortness of breath which occurs only with moderately strenuous activities such as walking up  an incline or lifting something heavy, probably functionally class II at most.  He denies resting shortness of breath, PND, orthopnea, or lower extremity edema. He denies any history of tachypalpitations although he has remained in atrial fibrillation for some time now. He currently denies any recent history of chest tightness or chest pressure.   Past Medical History  Diagnosis Date  . Hyperlipidemia   . Hypertension   . Heart murmur   . Hydrocele     left  . Kidney stones   . Internal hemorrhoids   . Vocal cord paralysis   . Macular degeneration     right eye  . Blindness     left eye  . OA (osteoarthritis) of neck   . Elevated PSA   . AK (actinic keratosis)   . Severe aortic stenosis 05/29/2013  . S/P mitral valve repair 09/15/2004    Complex valvuloplasty including artificial Goretex neocord placement x4 and 36mm SARP ring annuloplasty via right mini thoracotomy approach - Dr Silvestre Mesi @ [facility]  . Mitral regurgitation 02/25/2011    Recurrent MR s/p mitral valve repair   . Atrial fibrillation, persistent     chronic coumadin therapy  . Complication of anesthesia   . PONV (postoperative nausea and vomiting)     nausea  . Shortness of breath   . GERD (gastroesophageal reflux disease)   . H/O hiatal hernia     Past Surgical History  Procedure Laterality Date  . Groin mass open biopsy  2012  . Mitral valve repair  09/16/2011    @ [facility], Dr Silvestre Mesi  . Inguinal hernia repair  08/2011  . Tee without cardioversion N/A 07/05/2013    Procedure: TRANSESOPHAGEAL ECHOCARDIOGRAM (TEE);  Surgeon: Everette Rank, MD;  Location: [facility] ENDOSCOPY;  Service: Cardiovascular;  Laterality: N/A;  . Tee without cardioversion N/A 08/02/2013    Procedure: TRANSESOPHAGEAL ECHOCARDIOGRAM (TEE);  Surgeon: Everette Rank, MD;  Location: [facility] ENDOSCOPY;  Service: Cardiovascular;  Laterality: N/A;  . Esophagogastroduodenoscopy N/A 08/02/2013    Procedure: ESOPHAGOGASTRODUODENOSCOPY (EGD);  Surgeon: Everette Rank, MD;   Location: [facility] ENDOSCOPY;  Service: Cardiovascular;  Laterality: N/A;  . Hemorrhoid surgery    . Eye surgery Right     infection    Family History  Problem Relation Age of Onset  . Cancer Father     colon  . Hypertension Father   . Diabetes Father   . Hyperlipidemia Father   . Stroke Father   . Hypertension Mother   . Diabetes Mother   . Hyperlipidemia Mother   . Stroke Mother   . Heart attack Mother   . Hypertension Brother     MI, S/P MVR    Social History History  Substance Use Topics  . Smoking status: Former Smoker -- 8 years    Types: Cigarettes    Quit date: 10/12/1978  . Smokeless tobacco: Never Used  . Alcohol Use: No    Prior to Admission medications   Medication Sig Start Date End Date Taking? Authorizing Provider  acetaminophen (TYLENOL) 650 MG CR tablet Take 1,300 mg by mouth 2 (two) times daily.    Yes Historical Provider, MD  amiodarone (PACERONE) 200 MG tablet Take 1 tablet (200 mg total) by mouth 2 (two) times daily. Begin 7 days prior to surgery. 08/07/13  Yes Purcell Nails, MD  atorvastatin (LIPITOR) 20 MG tablet Take 20  mg by mouth every evening.  06/05/11  Yes Historical Provider, MD  b complex vitamins tablet Take 1 tablet by mouth daily.   Yes Historical Provider, MD  CARTIA XT 240 MG 24 hr capsule Take 240 mg by mouth every evening.  07/13/11  Yes Historical Provider, MD  furosemide (LASIX) 40 MG tablet Take 40 mg by mouth 2 (two) times daily.  05/29/11  Yes Historical Provider, MD  glucosamine-chondroitin 500-400 MG tablet Take 1 tablet by mouth 2 (two) times daily.    Yes Historical Provider, MD  lisinopril (PRINIVIL,ZESTRIL) 20 MG tablet Take 10 mg by mouth daily.    Yes Historical Provider, MD  Multiple Vitamin (MULTIVITAMIN) capsule Take 1 capsule by mouth daily.   Yes Historical Provider, MD  Multiple Vitamins-Minerals (PRESERVISION AREDS PO) Take by mouth 2 (two) times daily.   Yes Historical Provider, MD  vitamin B-12 (CYANOCOBALAMIN) 1000 MCG  tablet Take 1,000 mcg by mouth daily.   Yes Historical Provider, MD  warfarin (COUMADIN) 5 MG tablet Take 5-7.5 mg by mouth See admin instructions. 5mg  on Monday and Friday; 7.5mg  Sunday, Tuesday, Wednesday, Thursday, Saturday 05/24/11  Yes Historical Provider, MD    Allergies  Allergen Reactions  . Penicillins Rash     Review of Systems:              General:                      normal appetite, decreased energy, no weight gain, no weight loss, no fever             Cardiac:                      no chest pain with exertion, no chest pain at rest, + SOB with moderate exertion, no resting SOB, no PND, no orthopnea, no palpitations, + arrhythmia, + atrial fibrillation, no LE edema, no dizzy spells, no syncope             Respiratory:                + mild exertional shortness of breath, no home oxygen, no productive cough, no dry cough, no bronchitis, no wheezing, no hemoptysis, no asthma, no pain with inspiration or cough, no sleep apnea, no CPAP at night             GI:                                no difficulty swallowing, no reflux, no frequent heartburn, + hiatal hernia, no abdominal pain, no constipation, no diarrhea, no hematochezia, no hematemesis, no melena             GU:                              no dysuria,  no frequency, no urinary tract infection, no hematuria, no enlarged prostate, + kidney stones, no kidney disease             Vascular:                     no pain suggestive of claudication, no pain in feet, no leg cramps, no varicose veins, no DVT, no non-healing foot ulcer             Neuro:  no stroke, no TIA's, no seizures, no headaches, no temporary blindness one eye,  no slurred speech, no peripheral neuropathy, no chronic pain, no instability of gait, no memory/cognitive dysfunction             Musculoskeletal:         + mild arthritis particularly in right shoulder and back, no joint swelling, no myalgias, no difficulty walking, normal mobility                Skin:                            no rash, no itching, no skin infections, no pressure sores or ulcerations             Psych:                         no anxiety, no depression, no nervousness, no unusual recent stress             Eyes:                           no blurry vision, no floaters, no recent vision changes, + wears glasses or contacts             ENT:                            no hearing loss, no loose or painful teeth, no dentures, last saw dentist 2/14             Hematologic:               no easy bruising, no abnormal bleeding, no clotting disorder, no frequent epistaxis, no problems on chronic coumadin therapy             Endocrine:                   no diabetes, does not check CBG's at home                           Physical Exam:              BP 133/92  Pulse 81  Resp 18  Ht 5\' 7"  (1.702 m)  Wt 169 lb (76.658 kg)  BMI 26.46 kg/m2  SpO2 97%             General:                        well-appearing             HEENT:                       Unremarkable               Neck:                           no JVD, no bruits, no adenopathy               Chest:                         clear to auscultation, symmetrical breath sounds, no wheezes, no rhonchi  CV:                              Irregular rate and rhythm, prominent systolic murmur heard at RSB and apex, radiating across precordium             Abdomen:                    soft, non-tender, no masses               Extremities:                 warm, well-perfused, pulses diminished at ankle, palpable in groin, no LE edema             Rectal/GU                   Deferred             Neuro:                         Grossly non-focal and symmetrical throughout             Skin:                            Clean and dry, no rashes, no breakdown   Diagnostic Tests:  TRANSTHORACIC ECHOCARDIOGRAM  Both report and the images from transthoracic echocardiogram performed 05/29/2013 at [facility] cardiology are  reviewed. There is severe calcific aortic stenosis. Aortic valve appears tricuspid. All 3 leaflets are calcified with restricted leaflet motion. Peak velocity across the aortic valve ranges between 3.6 and 4.4 m/s corresponding to peak and mean transvalvular gradients of 66 and 40 mm mercury respectively. There is mild aortic insufficiency.  The left ventricular output tract measures 2.1 cm in diameter. There is normal left ventricular systolic function with ejection fraction estimated 50-55%. The mitral valve has been repaired. There is severe leaflet restriction of the posterior leaflet with an eccentric jet of mitral regurgitation coursing anteriorly. In some views the severity appears mild and others it appears more significant. There is moderate tricuspid regurgitation. The left and right atrium are enlarged.   CARDIAC CATHETERIZATION  PROCEDURE: Right and Left heart catheterization with selective coronary angiography, left ventriculogram.   INDICATIONS: Aortic stenosis; mitral regurgitation  The risks, benefits, and details of the procedure were explained to the patient. The patient verbalized understanding and wanted to proceed. Informed written consent was obtained.   PROCEDURE TECHNIQUE: A 5 French sheath was exchanged over a wire into a right antecubital vein. After Xylocaine anesthesia a 33F slender sheath was placed in the right radial artery with a single anterior needle wall stick. Left coronary angiography was done using a Judkins L3.5 guide catheter. Right coronary angiography was done using a Judkins R4 guide catheter. Access to the left ventricle was obtained using an AL-1 catheter with a straight wire. IV heparin was given during the procedure. Left ventriculography was done using a Langston catheter.   CONTRAST: Total of 60 cc.   COMPLICATIONS: None.   HEMODYNAMICS: Aortic pressure was 94/60; LV pressure was 146/6; LVEDP 15. There was a gradient between the left ventricle and aorta.  Right atrial pressure 14 or 15, mean right atrial pressure 13 mm Hg. RV pressure seventy over three, RVEDP 10 mm Hg. PA pressure 63/24, mean PA pressure 41 mm Hg. Pulmonary  capillary wedge pressure 26/46, mean capillary wedge pressure of 29 mm Hg. PA saturation 59%. Aortic saturation 89%. Cardiac output 4.78 L per minute. Cardiac index by Fick 2.53. Calculated aortic valve area using the Gorlin formula 0.77 cm. Large V waves noted on the wedge tracing.   ANGIOGRAPHIC DATA: The left main coronary artery is widely patent.  The left anterior descending artery is a large vessel which wrapped around the apex. There are 2 medium-sized diagonals. There is mild atherosclerosis in the proximal area of the first diagonal. The second diagonal is patent.   The left circumflex artery is a large vessel. The OM1 is small but patent. The OM 2 is medium sized and patent. The OM 3 is a large vessel and widely patent.   The right coronary artery is a large dominant vessel. There is a 20% calcified lesion in the proximal vessel. The bifurcation of the posterior lateral artery and posterior descending artery happens in the mid RCA. The posterior lateral artery is large and widely patent. The posterior descending artery is also large and widely patent.   LEFT VENTRICULOGRAM: Left ventricular angiogram was done in the 30 RAO projection and revealed normal left ventricular wall motion and systolic function with an estimated ejection fraction of 60 %. LVEDP was 15 mmHg. At least 2+ MR.   IMPRESSIONS:   1. No significant coronary artery disease. 2. Normal left ventricular systolic function. LVEDP 15 mmHg. Ejection fraction 60%. 3. Moderate pulmonary hypertension. Cardiac index 2.53. Prominent V waves noted on the wedge tracing.   4. Severe aortic stenosis with calculated aortic valve area of 0.77 cm.   RECOMMENDATION: Will send results to Dr. Cornelius Moras. We'll have to decide on intervention on his aortic and mitral valves. We'll also  have to repeat attempt at transesophageal echocardiogram to better visualize the valves.    CT ANGIOGRAPHY CHEST, ABDOMEN AND PELVIS  Technique: Multidetector CT imaging through the chest, abdomen and   pelvis was performed using the standard protocol during bolus   administration of intravenous contrast. Multiplanar reconstructed   images including MIPs were obtained and reviewed to evaluate the   vascular anatomy.   Contrast: OMNIPAQUE IOHEXOL 350 MG/ML SOLN,   Comparison: CT abdomen pelvis - 04/28/2004   CTA CHEST   Vascular Findings:   Scattered mixed calcified and noncalcified atherosclerotic plaque   within a normal caliber thoracic aorta with measurements as   follows. No thoracic aortic dissection or periaortic stranding.   The left vertebral artery is incidentally noted to arise directly   from the aortic arch. Otherwise, conventional configuration of the   aortic arch. There is scattered atherosclerotic plaque involving   the origin of the major branch vessels of the arch, not resulting   in hemodynamically significant stenosis.   Cardiomegaly, in particular, there is apparent enlargement of the   right atrium and ventricle as well as the left atrium. Coronary   artery calcifications. No pericardial effusion. There are   extensive calcifications within the aortic valve leaflets.   Calcifications within the presumed mitral valve annulus.   Although this examination was not tailored for evaluation of the   pulmonary arteries, there are no discrete filling defects within   the central pulmonary arterial tree to the level of the bilateral   segmental pulmonary arteries to suggest central pulmonary embolism.   The caliber of the main pulmonary artery is enlarged measuring 34   mm in diameter.   -----------------------------------------------   Thoracic aortic measurements:  Sinotubular junction: 31 mm as measured in greatest oblique coronal   dimension.   Proximal  ascending aorta: 33 mm as measured in greatest oblique   axial dimension at the level of the main pulmonary artery.   Aortic arch aorta: 33 mm as measured in greatest oblique sagittal   dimension.   Proximal descending thoracic aorta: 31 mm as measured in greatest   oblique axial dimension at the level of the main pulmonary artery.   Distal descending thoracic aorta: 29 mm as measured in greatest   oblique axial dimension at the level of the diaphragmatic hiatus.   Review of the MIP images confirms the above findings.   -----------------------------------------------------   Nonvascular findings:   Mild centrilobular emphysematous change. Note is made of an   approximately 5 mm noncalcified ground-glass nodule within the   subpleural aspect of the right lower lobe (image 33, series seven).   There is minimal dependent subsegmental atelectasis. There is a   minimal amount of subpleural linear heterogeneous opacities within   the subpleural aspect of the right middle lobe adjacent to the   cerclage wires involving the anterior aspect of the right fourth   ribs, presumably iatrogenic. The central pulmonary airways are   widely patent.   Scattered shoddy mediastinal lymph nodes with index left-sided   paratracheal lymph node measuring 1.1 cm in short axis diameter   (image 32, series six). Index precarinal lymph node measures   approximately 1.1 cm (image 35). Scattered shoddy prevascular   lymph nodes are individually not enlarged by size criteria. No   axillary lymphadenopathy. Normal appearance of the thyroid gland.   No acute or aggressive osseous abnormalities. Mild DDD of the   lower thoracic spine, worse at T10 - T11. Mild compression   deformity (<25%) involving the T6 vertebral body without associated   fracture line.   IMPRESSION:   1. Scattered atherosclerotic plaque within a normal caliber   thoracic aorta. No evidence of thoracic aortic aneurysm or   dissection.   2.  Extensive calcifications within the aortic valve leaflets   compatible with provided history of aortic stenosis.   3. Cardiomegaly, including enlargement the caliber of the main   pulmonary artery, nonspecific but could be seen in the setting of   pulmonary arterial hypertension. Further evaluation with cardiac   echo mid formed as clinically indicated.   4. Coronary artery calcifications.   5. Indeterminate approximately 5 mm ground-glass nodule in the   subpleural aspect the right lower lobe. Given risk factors for   bronchogenic carcinoma, follow-up chest CT at 6 - 12 months is   recommended. This recommendation follows the consensus statement:   Guidelines for Management of Small Pulmonary Nodules Detected on CT   Scans: A Statement from the Fleischner Society as published in   Radiology 2005; 237:395-400. Note, as this nodules primarily   ground glass, surveillance imaging greater than 2 years may be   indicated.   6. Postsurgical change involving the anterior aspect of the right   fourth rib with associated adjacent iatrogenic architectural   distortion within the subpleural aspect of the right middle lobe.   7. Nonspecific shoddy mediastinal adenopathy, possibly reactive in   etiology.   -----------------------------------------------------   CTA ABDOMEN AND PELVIS   Vascular Findings:   Abdominal aorta: There is scattered mixed calcified and   noncalcified atherosclerotic plaque within normal caliber abdominal   aorta measuring approximately 2 cm in greatest oblique short axis   axial  dimension (image 124, series six). No evidence of thoracic   aortic abdominal aortic dissection or periaortic stranding.   Celiac artery: There is a long segment narrowing of the origin and   proximal aspect of the celiac artery measuring approximately 2.1 cm   in greatest sagittal dimension (image 97, series 803) which results   in at least 50% luminal narrowing with associated poststenotic     dilatation measuring approximately 1.3 cm in greatest oblique short   axis axial dimension (image 97, series six). Conventional   branching pattern.   SMA: There is a mixture of calcified noncalcified plaque involving   origin of proximal aspect of the SMA, not resulting in   hemodynamically significant stenosis. Incidental note is made of a   replaced right sided hepatic artery which arises from the proximal   SMA.   Right renal artery: Solitary; there is a mixture of calcified and   noncalcified plaque involving the origin and proximal aspect of the   right renal artery, not resulting in hemodynamically significant   stenosis.   Left renal artery: Solitary; widely patent without hemodynamically   significant narrowing.   IMA: Widely patent.   Pelvic vasculature: There is very mild ectasia of the distal   aspect of the left common iliac artery measuring approximately 1.5   cm in greatest short axis axial dimension (image 167, series six).   The right common iliac artery is a normal caliber. There is   scattered primarily eccentric calcified plaque within the bilateral   common iliac arteries, not resulting in medically significant   stenosis. The bilateral external iliac arteries to be tortuous   widely patent and free of hemodynamically significant narrowing.   The bilateral internal iliac arteries are diseased proximally with   patent and of normal caliber.   Review of the MIP images confirms the above findings.   ------------------------------------------------------------   Nonvascular findings:   Evaluation of the abdominal organs is limited to the arterial phase   of enhancement.   Normal hepatic contour. Note is made of a sub centimeter   (approximately 8 mm) hypoattenuating lesion within the caudal   aspect of the right lobe of the liver (image 124, series six which   is too small to accurate characterize of favored to represent a   hepatic cyst. There are additional sub  centimeter hypoattenuating   lesions within the dome of the left lobe of liver (image 92, series   6 as well as the caudal aspect of the medial segment left lobe of   the liver (image 21, series six) which are also too small to   accurate characterize but also favored to represent additional   hepatic cysts. No discrete hyperenhancing hepatic lesions. There   is made of an approximately 0.9 x 1.2 cm gallstone lie dependently   within the fundus was otherwise normal appearing gallbladder. No   gallbladder wall thickening or pericholecystic fluid. No definite   intra or extra panic biliary ductal dilatation. No ascites.   There is symmetric enhancement the bilateral kidneys. Note is made   of an approximately 3.7 x 5.0 cm exophytic cyst arising from the   superior pole left kidney which measures approximately 18 HU (image   87, series six). There is an approximately 3.1 x 3.0 cm   hypoattenuating (19 HU) left-sided parapelvic cyst (image 21,   series 3). Note is made of an approximately 1.5 cm hypoattenuating   lesion within the caudal aspect of the  left kidney (image 29,   series six) which measures approximately 28 HU and thus cannot be   characterized as a simple renal cyst. Additional bilateral sub   centimeter hypoattenuating lesions are too small to accurately   characterize. No definite renal stones on the post contrast   examination. No urinary obstruction. There is a very minimal   amount of grossly symmetric likely dilated perinephric stranding.   Normal appearance of the bilateral adrenal glands, pancreas and   spleen.   Hiatal hernia. Moderate colonic stool burden. The sigmoid colon   is noted to be redundant. Normal appearance of the appendix. No   evidence of enteric obstruction. No pneumoperitoneum, pneumatosis   or portal venous gas. Scattered shoddy retroperitoneal lymph nodes   are individually not enlarged by size criteria with index   aortocaval lymph node measuring  approximately 0.8 cm in short axis   diameter ( image 115, series six). No definite retroperitoneal,   mesenteric, pelvic or inguinal lymphadenopathy.   The prostate is enlarged with mass effect upon the undersurface of   the bladder. There is possible mild diffuse thickening of the   urinary bladder wall, possibly accentuated due to under distension.   Incidental note is made of a moderate sized right-sided hydrocele.   No acute or aggressive osseous abnormalities. Bilateral facet   degenerative change of the lower lumbar spine. Small mesenteric   fat containing periumbilical hernia. Small mesenteric fat   containing right-sided inguinal hernia.   IMPRESSION:   1. Scattered mixed calcified and noncalcified atherosclerotic   plaque within the normal caliber abdominal aorta. No evidence of   abdominal aortic aneurysm or dissection.   2. There is a long segment moderate narrowing involving the origin   and proximal aspect of the celiac artery measuring approximately   2.1 cm in length and resulting in approximately 50% luminal   narrowing with associated post stenotic dilatation.   3. Enlarged prostate with possible diffuse thickening of the   urinary bladder wall, possibly accentuated due to under distension   but could be indicative of bladder outlet obstruction.   4. Incidental note made of a moderate sized right-sided hydrocele.   5. There is an indeterminate approximately 1.5 cm left-sided renal   lesion which cannot be characterized as simple renal cyst. Further   evaluation of abdominal MRI may be performed as clinically   indicated.   6. Additional bilateral renal cysts as above.   7. Cholelithiasis without evidence of cholecystitis.   Original Report Authenticated By: Tacey Ruiz, MD   UPPER GI SERIES W/HIGH DENSITY W/KUB   TECHNIQUE:   After obtaining a scout radiograph a routine upper GI series was   performed using thin and high density barium.   COMPARISON: CT scan of  the chest dated 07/24/2013   FLUOROSCOPY TIME: 2 min 12 seconds   FINDINGS:   The bowel gas pattern is normal. Extensive calcification in the   abdominal aorta and common iliac arteries.   The mucosa and motility of the esophagus are normal. There is a   small sliding hiatal hernia. No stricture. A 13 mm barium tablet   passed immediately from the mouth to the stomach with no delay.   The fundus, body, and antrum of the stomach are normal. Pylorus,   duodenal bulb, and C-loop are normal.   IMPRESSION:   Small sliding hiatal hernia. Otherwise, normal exam.   Electronically Signed   By: Geanie Cooley M.D.   On: 07/28/2013  09:02    Impression:  The patient has severe calcific aortic stenosis with gradual progression of symptoms of exertional shortness of breath, functional class II-III. Left ventricular systolic function appears preserved. The patient does appear to have significant diastolic dysfunction as well as at least moderate residual mitral regurgitation s/p mitral valve repair in 2005.  He also has moderate tricuspid regurgitation and chronic persistent atrial fibrillation.  Attempts at preoperative transesophageal echocardiogram to better assess the functional pathology and severity of recurrent mitral regurgitation have been aborted do to inability to cannulate the esophagus with aTEE scope. However, upper GI barium contrast study and upper GI endoscopy both appear normal and without sign of significant stricture or esophageal pathology.     Plan:  I have discussed the indications, risks, and potential benefits of surgical intervention with the patient and his wife in the office today.  The risks and benefits of surgical intervention has been discussed in detail.  Long-term prognosis with medical therapy was discussed. Alternative approaches such as conventional surgical aortic valve replacement with or without concomitant redo mitral valve repair/replacement, transcatheter aortic valve  replacement, and palliative medical therapy were compared and contrasted at length. This discussion was placed in the context of the patient's own specific clinical presentation and past medical history.  All of their questions been addressed.  Discussion was held comparing the relative risks of mechanical valve replacement with need for lifelong anticoagulation versus use of a bioprosthetic tissue valve and the associated potential for late structural valve deterioration in failure.  This discussion was placed in the context of the patient's particular circumstances, and as a result the patient specifically requests that their valve be replaced using bioprosthetic tissue valves.  The patient understands and accepts all potential associated risks of surgery including but not limited to risk of death, stroke, myocardial infarction, congestive heart failure, respiratory failure, renal failure, pneumonia, bleeding requiring blood transfusion and or reexploration, arrhythmia, heart block or bradycardia requiring permanent pacemaker, aortic dissection or other major vascular complication, pleural effusions or other delayed complications related to continued congestive heart failure, and other late complications related to valve replacement including structural valve deterioration and failure, thrombosis, endocarditis, or paravalvular leak.  We plan to proceed with surgery on Tuesday, 08/15/2013 for aortic valve replacement, possible redo mitral valve repair or replacement, tricuspid valve repair and possible maze procedure. We will try to make arrangements for a pediatric sized TEE probe to be available in the event that placement of a conventional TEE probe cannot be performed while the patient is under general anesthesia.  The patient has been instructed to stop taking Coumadin and begin taking amiodarone in anticipation of surgery. All of his questions been addressed.      Salvatore Decent. Cornelius Moras, MD 08/07/2013 2:34  PM

## 2013-08-15 ENCOUNTER — Inpatient Hospital Stay (HOSPITAL_COMMUNITY)
Admission: RE | Admit: 2013-08-15 | Discharge: 2013-08-21 | DRG: 219 | Disposition: A | Discharge status: Home Health | Payer: Medicare Other | Source: Ambulatory Visit | Attending: Thoracic Surgery (Cardiothoracic Vascular Surgery) | Admitting: Thoracic Surgery (Cardiothoracic Vascular Surgery)

## 2013-08-15 ENCOUNTER — Inpatient Hospital Stay (HOSPITAL_COMMUNITY): Payer: Medicare Other

## 2013-08-15 ENCOUNTER — Inpatient Hospital Stay (HOSPITAL_COMMUNITY): Payer: Medicare Other | Admitting: Anesthesiology

## 2013-08-15 ENCOUNTER — Encounter (HOSPITAL_COMMUNITY): Payer: Self-pay | Admitting: *Deleted

## 2013-08-15 ENCOUNTER — Encounter (HOSPITAL_COMMUNITY)
Admission: RE | Disposition: A | Discharge status: Home Health | Payer: Medicare Other | Source: Ambulatory Visit | Attending: Thoracic Surgery (Cardiothoracic Vascular Surgery)

## 2013-08-15 ENCOUNTER — Encounter (HOSPITAL_COMMUNITY): Payer: Medicare Other | Admitting: Anesthesiology

## 2013-08-15 DIAGNOSIS — I080 Rheumatic disorders of both mitral and aortic valves: Secondary | ICD-10-CM | POA: Diagnosis not present

## 2013-08-15 DIAGNOSIS — Z7982 Long term (current) use of aspirin: Secondary | ICD-10-CM

## 2013-08-15 DIAGNOSIS — Z953 Presence of xenogenic heart valve: Secondary | ICD-10-CM

## 2013-08-15 DIAGNOSIS — I4891 Unspecified atrial fibrillation: Secondary | ICD-10-CM | POA: Diagnosis present

## 2013-08-15 DIAGNOSIS — Z880 Allergy status to penicillin: Secondary | ICD-10-CM

## 2013-08-15 DIAGNOSIS — I059 Rheumatic mitral valve disease, unspecified: Secondary | ICD-10-CM

## 2013-08-15 DIAGNOSIS — I498 Other specified cardiac arrhythmias: Secondary | ICD-10-CM | POA: Diagnosis not present

## 2013-08-15 DIAGNOSIS — N433 Hydrocele, unspecified: Secondary | ICD-10-CM | POA: Diagnosis present

## 2013-08-15 DIAGNOSIS — Z87891 Personal history of nicotine dependence: Secondary | ICD-10-CM

## 2013-08-15 DIAGNOSIS — Q619 Cystic kidney disease, unspecified: Secondary | ICD-10-CM

## 2013-08-15 DIAGNOSIS — E785 Hyperlipidemia, unspecified: Secondary | ICD-10-CM | POA: Diagnosis present

## 2013-08-15 DIAGNOSIS — I079 Rheumatic tricuspid valve disease, unspecified: Secondary | ICD-10-CM | POA: Diagnosis not present

## 2013-08-15 DIAGNOSIS — E8779 Other fluid overload: Secondary | ICD-10-CM | POA: Diagnosis not present

## 2013-08-15 DIAGNOSIS — D62 Acute posthemorrhagic anemia: Secondary | ICD-10-CM | POA: Diagnosis not present

## 2013-08-15 DIAGNOSIS — J9819 Other pulmonary collapse: Secondary | ICD-10-CM | POA: Diagnosis not present

## 2013-08-15 DIAGNOSIS — K449 Diaphragmatic hernia without obstruction or gangrene: Secondary | ICD-10-CM | POA: Diagnosis present

## 2013-08-15 DIAGNOSIS — I2789 Other specified pulmonary heart diseases: Secondary | ICD-10-CM | POA: Diagnosis present

## 2013-08-15 DIAGNOSIS — Z833 Family history of diabetes mellitus: Secondary | ICD-10-CM

## 2013-08-15 DIAGNOSIS — K8020 Calculus of gallbladder without cholecystitis without obstruction: Secondary | ICD-10-CM | POA: Diagnosis present

## 2013-08-15 DIAGNOSIS — D6959 Other secondary thrombocytopenia: Secondary | ICD-10-CM | POA: Diagnosis not present

## 2013-08-15 DIAGNOSIS — K219 Gastro-esophageal reflux disease without esophagitis: Secondary | ICD-10-CM | POA: Diagnosis present

## 2013-08-15 DIAGNOSIS — I10 Essential (primary) hypertension: Secondary | ICD-10-CM | POA: Diagnosis present

## 2013-08-15 DIAGNOSIS — Z7901 Long term (current) use of anticoagulants: Secondary | ICD-10-CM | POA: Diagnosis not present

## 2013-08-15 DIAGNOSIS — Z79899 Other long term (current) drug therapy: Secondary | ICD-10-CM

## 2013-08-15 DIAGNOSIS — I071 Rheumatic tricuspid insufficiency: Secondary | ICD-10-CM | POA: Diagnosis present

## 2013-08-15 DIAGNOSIS — R339 Retention of urine, unspecified: Secondary | ICD-10-CM | POA: Diagnosis not present

## 2013-08-15 DIAGNOSIS — N170 Acute kidney failure with tubular necrosis: Secondary | ICD-10-CM | POA: Diagnosis not present

## 2013-08-15 DIAGNOSIS — K5900 Constipation, unspecified: Secondary | ICD-10-CM | POA: Diagnosis not present

## 2013-08-15 DIAGNOSIS — Z8249 Family history of ischemic heart disease and other diseases of the circulatory system: Secondary | ICD-10-CM

## 2013-08-15 DIAGNOSIS — I700 Atherosclerosis of aorta: Secondary | ICD-10-CM | POA: Diagnosis present

## 2013-08-15 DIAGNOSIS — N401 Enlarged prostate with lower urinary tract symptoms: Secondary | ICD-10-CM | POA: Diagnosis present

## 2013-08-15 DIAGNOSIS — J988 Other specified respiratory disorders: Secondary | ICD-10-CM | POA: Diagnosis not present

## 2013-08-15 DIAGNOSIS — Z4682 Encounter for fitting and adjustment of non-vascular catheter: Secondary | ICD-10-CM | POA: Diagnosis not present

## 2013-08-15 DIAGNOSIS — H3530 Unspecified macular degeneration: Secondary | ICD-10-CM | POA: Diagnosis present

## 2013-08-15 DIAGNOSIS — Z823 Family history of stroke: Secondary | ICD-10-CM

## 2013-08-15 DIAGNOSIS — R9389 Abnormal findings on diagnostic imaging of other specified body structures: Secondary | ICD-10-CM | POA: Diagnosis not present

## 2013-08-15 DIAGNOSIS — I350 Nonrheumatic aortic (valve) stenosis: Secondary | ICD-10-CM | POA: Diagnosis present

## 2013-08-15 DIAGNOSIS — I340 Nonrheumatic mitral (valve) insufficiency: Secondary | ICD-10-CM | POA: Diagnosis present

## 2013-08-15 DIAGNOSIS — I359 Nonrheumatic aortic valve disorder, unspecified: Secondary | ICD-10-CM

## 2013-08-15 DIAGNOSIS — Y831 Surgical operation with implant of artificial internal device as the cause of abnormal reaction of the patient, or of later complication, without mention of misadventure at the time of the procedure: Secondary | ICD-10-CM | POA: Diagnosis not present

## 2013-08-15 DIAGNOSIS — N138 Other obstructive and reflux uropathy: Secondary | ICD-10-CM | POA: Diagnosis present

## 2013-08-15 DIAGNOSIS — R0989 Other specified symptoms and signs involving the circulatory and respiratory systems: Secondary | ICD-10-CM | POA: Diagnosis not present

## 2013-08-15 DIAGNOSIS — I4819 Other persistent atrial fibrillation: Secondary | ICD-10-CM | POA: Diagnosis present

## 2013-08-15 DIAGNOSIS — J90 Pleural effusion, not elsewhere classified: Secondary | ICD-10-CM | POA: Diagnosis not present

## 2013-08-15 DIAGNOSIS — Y9210 Unspecified residential institution as the place of occurrence of the external cause: Secondary | ICD-10-CM | POA: Diagnosis not present

## 2013-08-15 DIAGNOSIS — Z9889 Other specified postprocedural states: Secondary | ICD-10-CM

## 2013-08-15 HISTORY — PX: AORTIC VALVE REPLACEMENT: SHX41

## 2013-08-15 HISTORY — DX: Presence of xenogenic heart valve: Z95.3

## 2013-08-15 HISTORY — PX: MITRAL VALVE REPAIR: SHX2039

## 2013-08-15 HISTORY — PX: INTRAOPERATIVE TRANSESOPHAGEAL ECHOCARDIOGRAM: SHX5062

## 2013-08-15 LAB — POCT I-STAT 4, (NA,K, GLUC, HGB,HCT)
Glucose, Bld: 102 mg/dL — ABNORMAL HIGH (ref 70–99)
Glucose, Bld: 105 mg/dL — ABNORMAL HIGH (ref 70–99)
Glucose, Bld: 94 mg/dL (ref 70–99)
Glucose, Bld: 125 mg/dL — ABNORMAL HIGH (ref 70–99)
Glucose, Bld: 144 mg/dL — ABNORMAL HIGH (ref 70–99)
Glucose, Bld: 127 mg/dL — ABNORMAL HIGH (ref 70–99)
Glucose, Bld: 98 mg/dL (ref 70–99)
Glucose, Bld: 91 mg/dL (ref 70–99)
HCT: 31 % — ABNORMAL LOW (ref 39.0–52.0)
HCT: 23 % — ABNORMAL LOW (ref 39.0–52.0)
HCT: 31 % — ABNORMAL LOW (ref 39.0–52.0)
HCT: 26 % — ABNORMAL LOW (ref 39.0–52.0)
HCT: 25 % — ABNORMAL LOW (ref 39.0–52.0)
HCT: 25 % — ABNORMAL LOW (ref 39.0–52.0)
HCT: 32 % — ABNORMAL LOW (ref 39.0–52.0)
HCT: 31 % — ABNORMAL LOW (ref 39.0–52.0)
Hemoglobin: 10.5 g/dL — ABNORMAL LOW (ref 13.0–17.0)
Hemoglobin: 10.5 g/dL — ABNORMAL LOW (ref 13.0–17.0)
Hemoglobin: 7.8 g/dL — ABNORMAL LOW (ref 13.0–17.0)
Hemoglobin: 8.8 g/dL — ABNORMAL LOW (ref 13.0–17.0)
Hemoglobin: 8.5 g/dL — ABNORMAL LOW (ref 13.0–17.0)
Hemoglobin: 8.5 g/dL — ABNORMAL LOW (ref 13.0–17.0)
Hemoglobin: 10.9 g/dL — ABNORMAL LOW (ref 13.0–17.0)
Hemoglobin: 10.5 g/dL — ABNORMAL LOW (ref 13.0–17.0)
Potassium: 3.2 mEq/L — ABNORMAL LOW (ref 3.5–5.1)
Potassium: 3.3 mEq/L — ABNORMAL LOW (ref 3.5–5.1)
Potassium: 3.4 mEq/L — ABNORMAL LOW (ref 3.5–5.1)
Potassium: 3.9 mEq/L (ref 3.5–5.1)
Potassium: 4 mEq/L (ref 3.5–5.1)
Potassium: 3.8 mEq/L (ref 3.5–5.1)
Potassium: 3.6 mEq/L (ref 3.5–5.1)
Potassium: 3.7 meq/L (ref 3.5–5.1)
Sodium: 144 mEq/L (ref 135–145)
Sodium: 139 mEq/L (ref 135–145)
Sodium: 143 mEq/L (ref 135–145)
Sodium: 141 mEq/L (ref 135–145)
Sodium: 141 mEq/L (ref 135–145)
Sodium: 142 mEq/L (ref 135–145)
Sodium: 144 mEq/L (ref 135–145)
Sodium: 144 meq/L (ref 135–145)

## 2013-08-15 LAB — POCT I-STAT 3, ART BLOOD GAS (G3+)
Acid-Base Excess: 1 mmol/L (ref 0.0–2.0)
Acid-Base Excess: 1 mmol/L (ref 0.0–2.0)
Acid-base deficit: 1 mmol/L (ref 0.0–2.0)
Bicarbonate: 26.5 mEq/L — ABNORMAL HIGH (ref 20.0–24.0)
Bicarbonate: 30.1 mEq/L — ABNORMAL HIGH (ref 20.0–24.0)
Bicarbonate: 23.6 mEq/L (ref 20.0–24.0)
Bicarbonate: 24.2 meq/L — ABNORMAL HIGH (ref 20.0–24.0)
O2 Saturation: 100 %
O2 Saturation: 100 %
O2 Saturation: 99 %
O2 Saturation: 100 %
Patient temperature: 36.4
TCO2: 28 mmol/L (ref 0–100)
TCO2: 32 mmol/L (ref 0–100)
TCO2: 25 mmol/L (ref 0–100)
TCO2: 25 mmol/L (ref 0–100)
pCO2 arterial: 45.8 mmHg — ABNORMAL HIGH (ref 35.0–45.0)
pCO2 arterial: 78.1 mmHg (ref 35.0–45.0)
pCO2 arterial: 40.1 mmHg (ref 35.0–45.0)
pCO2 arterial: 36.4 mmHg (ref 35.0–45.0)
pH, Arterial: 7.371 (ref 7.350–7.450)
pH, Arterial: 7.194 — CL (ref 7.350–7.450)
pH, Arterial: 7.377 (ref 7.350–7.450)
pH, Arterial: 7.428 (ref 7.350–7.450)
pO2, Arterial: 350 mmHg — ABNORMAL HIGH (ref 80.0–100.0)
pO2, Arterial: 323 mmHg — ABNORMAL HIGH (ref 80.0–100.0)
pO2, Arterial: 150 mmHg — ABNORMAL HIGH (ref 80.0–100.0)
pO2, Arterial: 287 mmHg — ABNORMAL HIGH (ref 80.0–100.0)

## 2013-08-15 LAB — CBC
HCT: 31.5 % — ABNORMAL LOW (ref 39.0–52.0)
HCT: 30.6 % — ABNORMAL LOW (ref 39.0–52.0)
Hemoglobin: 10.9 g/dL — ABNORMAL LOW (ref 13.0–17.0)
Hemoglobin: 10.3 g/dL — ABNORMAL LOW (ref 13.0–17.0)
MCH: 31.1 pg (ref 26.0–34.0)
MCH: 30.9 pg (ref 26.0–34.0)
MCHC: 34.6 g/dL (ref 30.0–36.0)
MCHC: 33.7 g/dL (ref 30.0–36.0)
MCV: 89.7 fL (ref 78.0–100.0)
MCV: 91.9 fL (ref 78.0–100.0)
Platelets: 76 10*3/uL — ABNORMAL LOW (ref 150–400)
Platelets: 74 10*3/uL — ABNORMAL LOW (ref 150–400)
RBC: 3.51 MIL/uL — ABNORMAL LOW (ref 4.22–5.81)
RBC: 3.33 MIL/uL — ABNORMAL LOW (ref 4.22–5.81)
RDW: 14 % (ref 11.5–15.5)
RDW: 14.2 % (ref 11.5–15.5)
WBC: 15.5 10*3/uL — ABNORMAL HIGH (ref 4.0–10.5)
WBC: 18.7 10*3/uL — ABNORMAL HIGH (ref 4.0–10.5)

## 2013-08-15 LAB — CREATININE, SERUM
Creatinine, Ser: 1.02 mg/dL (ref 0.50–1.35)
GFR calc Af Amer: 79 mL/min — ABNORMAL LOW (ref >90)
GFR calc non Af Amer: 68 mL/min — ABNORMAL LOW (ref >90)

## 2013-08-15 LAB — HEMOGLOBIN AND HEMATOCRIT, BLOOD
HCT: 23.8 % — ABNORMAL LOW (ref 39.0–52.0)
Hemoglobin: 8.2 g/dL — ABNORMAL LOW (ref 13.0–17.0)

## 2013-08-15 LAB — POCT I-STAT, CHEM 8
BUN: 15 mg/dL (ref 6–23)
Calcium, Ion: 1.2 mmol/L (ref 1.13–1.30)
Chloride: 108 mEq/L (ref 96–112)
Creatinine, Ser: 1.2 mg/dL (ref 0.50–1.35)
Glucose, Bld: 131 mg/dL — ABNORMAL HIGH (ref 70–99)
HCT: 29 % — ABNORMAL LOW (ref 39.0–52.0)
Hemoglobin: 9.9 g/dL — ABNORMAL LOW (ref 13.0–17.0)
Potassium: 4.5 mEq/L (ref 3.5–5.1)
Sodium: 142 mEq/L (ref 135–145)
TCO2: 19 mmol/L (ref 0–100)

## 2013-08-15 LAB — PROTIME-INR
INR: 2 — ABNORMAL HIGH (ref 0.00–1.49)
Prothrombin Time: 22.1 seconds — ABNORMAL HIGH (ref 11.6–15.2)

## 2013-08-15 LAB — PREPARE RBC (CROSSMATCH)

## 2013-08-15 LAB — GLUCOSE, CAPILLARY: Glucose-Capillary: 129 mg/dL — ABNORMAL HIGH (ref 70–99)

## 2013-08-15 LAB — APTT: aPTT: 36 seconds (ref 24–37)

## 2013-08-15 LAB — MAGNESIUM: Magnesium: 2.8 mg/dL — ABNORMAL HIGH (ref 1.5–2.5)

## 2013-08-15 LAB — PLATELET COUNT: Platelets: 108 10*3/uL — ABNORMAL LOW (ref 150–400)

## 2013-08-15 SURGERY — REPLACEMENT, AORTIC VALVE, OPEN
Anesthesia: General | Site: Chest | Wound class: Clean

## 2013-08-15 MED ORDER — INSULIN REGULAR BOLUS VIA INFUSION
0.0000 [IU] | Freq: Three times a day (TID) | INTRAVENOUS | Status: DC
  Filled 2013-08-15: qty 10

## 2013-08-15 MED ORDER — SODIUM CHLORIDE 0.45 % IV SOLN
INTRAVENOUS | Status: DC
  Administered 2013-08-15: 20 mL/h via INTRAVENOUS

## 2013-08-15 MED ORDER — ARTIFICIAL TEARS OP OINT
TOPICAL_OINTMENT | OPHTHALMIC | Status: DC | PRN
  Administered 2013-08-15: 1 via OPHTHALMIC

## 2013-08-15 MED ORDER — BISACODYL 10 MG RE SUPP: 10.0000 mg | Freq: Every day | RECTAL | Status: DC

## 2013-08-15 MED ORDER — ROCURONIUM BROMIDE 100 MG/10ML IV SOLN
INTRAVENOUS | Status: DC | PRN
  Administered 2013-08-15: 50 mg via INTRAVENOUS

## 2013-08-15 MED ORDER — HEPARIN SODIUM (PORCINE) 1000 UNIT/ML IJ SOLN
INTRAMUSCULAR | Status: DC | PRN
  Administered 2013-08-15: 10000 [IU] via INTRAVENOUS
  Administered 2013-08-15: 20000 [IU] via INTRAVENOUS

## 2013-08-15 MED ORDER — ALBUMIN HUMAN 5 % IV SOLN
INTRAVENOUS | Status: DC | PRN
  Administered 2013-08-15 (×2): via INTRAVENOUS

## 2013-08-15 MED ORDER — PROTAMINE SULFATE 10 MG/ML IV SOLN
INTRAVENOUS | Status: DC | PRN
  Administered 2013-08-15: 10 mg via INTRAVENOUS
  Administered 2013-08-15 (×3): 50 mg via INTRAVENOUS
  Administered 2013-08-15: 10 mg via INTRAVENOUS
  Administered 2013-08-15: 30 mg via INTRAVENOUS
  Administered 2013-08-15 (×2): 50 mg via INTRAVENOUS

## 2013-08-15 MED ORDER — NITROGLYCERIN IN D5W 200-5 MCG/ML-% IV SOLN: 0.0000 ug/min | INTRAVENOUS | Status: DC

## 2013-08-15 MED ORDER — METOPROLOL TARTRATE 12.5 MG HALF TABLET
12.5000 mg | ORAL_TABLET | Freq: Once | ORAL | Status: AC
  Administered 2013-08-15: 12.5 mg via ORAL
  Filled 2013-08-15: qty 1

## 2013-08-15 MED ORDER — INSULIN ASPART 100 UNIT/ML ~~LOC~~ SOLN
0.0000 [IU] | SUBCUTANEOUS | Status: DC
  Administered 2013-08-15 – 2013-08-17 (×9): 2 [IU] via SUBCUTANEOUS

## 2013-08-15 MED ORDER — METOPROLOL TARTRATE 12.5 MG HALF TABLET
12.5000 mg | ORAL_TABLET | Freq: Two times a day (BID) | ORAL | Status: DC
  Administered 2013-08-17 – 2013-08-18 (×2): 12.5 mg via ORAL
  Filled 2013-08-15 (×7): qty 1

## 2013-08-15 MED ORDER — FAMOTIDINE IN NACL 20-0.9 MG/50ML-% IV SOLN
20.0000 mg | Freq: Two times a day (BID) | INTRAVENOUS | Status: AC
  Administered 2013-08-15 (×2): 20 mg via INTRAVENOUS
  Filled 2013-08-15: qty 50

## 2013-08-15 MED ORDER — SODIUM CHLORIDE 0.9 % IV SOLN: INTRAVENOUS | Status: DC

## 2013-08-15 MED ORDER — POTASSIUM CHLORIDE 10 MEQ/50ML IV SOLN
10.0000 meq | INTRAVENOUS | Status: AC
  Administered 2013-08-15 (×3): 10 meq via INTRAVENOUS

## 2013-08-15 MED ORDER — ACETAMINOPHEN 650 MG RE SUPP
650.0000 mg | Freq: Once | RECTAL | Status: AC
  Administered 2013-08-15: 650 mg via RECTAL

## 2013-08-15 MED ORDER — METOPROLOL TARTRATE 1 MG/ML IV SOLN
2.5000 mg | INTRAVENOUS | Status: DC | PRN
  Administered 2013-08-16: 2.5 mg via INTRAVENOUS
  Filled 2013-08-15: qty 5

## 2013-08-15 MED ORDER — LACTATED RINGERS IV SOLN
INTRAVENOUS | Status: DC | PRN
  Administered 2013-08-15 (×2): via INTRAVENOUS

## 2013-08-15 MED ORDER — ASPIRIN 81 MG PO CHEW: 324.0000 mg | CHEWABLE_TABLET | Freq: Every day | ORAL | Status: DC

## 2013-08-15 MED ORDER — BISACODYL 5 MG PO TBEC
10.0000 mg | DELAYED_RELEASE_TABLET | Freq: Every day | ORAL | Status: DC
  Administered 2013-08-16 – 2013-08-18 (×2): 10 mg via ORAL
  Filled 2013-08-15: qty 2

## 2013-08-15 MED ORDER — PROPOFOL 10 MG/ML IV BOLUS
INTRAVENOUS | Status: DC | PRN
  Administered 2013-08-15: 75 mg via INTRAVENOUS

## 2013-08-15 MED ORDER — DOPAMINE-DEXTROSE 3.2-5 MG/ML-% IV SOLN: 0.0000 ug/kg/min | INTRAVENOUS | Status: DC

## 2013-08-15 MED ORDER — PANTOPRAZOLE SODIUM 40 MG PO TBEC
40.0000 mg | DELAYED_RELEASE_TABLET | Freq: Every day | ORAL | Status: DC
  Administered 2013-08-17 – 2013-08-20 (×4): 40 mg via ORAL
  Filled 2013-08-15 (×3): qty 1

## 2013-08-15 MED ORDER — MORPHINE SULFATE 2 MG/ML IJ SOLN
2.0000 mg | INTRAMUSCULAR | Status: DC | PRN
  Administered 2013-08-16 (×2): 4 mg via INTRAVENOUS
  Filled 2013-08-15 (×2): qty 2

## 2013-08-15 MED ORDER — VECURONIUM BROMIDE 10 MG IV SOLR
INTRAVENOUS | Status: DC | PRN
  Administered 2013-08-15 (×2): 10 mg via INTRAVENOUS

## 2013-08-15 MED ORDER — OXYCODONE HCL 5 MG PO TABS
5.0000 mg | ORAL_TABLET | ORAL | Status: DC | PRN
  Administered 2013-08-16 (×2): 10 mg via ORAL
  Administered 2013-08-16: 5 mg via ORAL
  Administered 2013-08-16 – 2013-08-17 (×4): 10 mg via ORAL
  Filled 2013-08-15 (×3): qty 2
  Filled 2013-08-15: qty 1
  Filled 2013-08-15: qty 2
  Filled 2013-08-15: qty 1
  Filled 2013-08-15 (×2): qty 2

## 2013-08-15 MED ORDER — SODIUM CHLORIDE 0.9 % IJ SOLN
OROMUCOSAL | Status: DC | PRN
  Administered 2013-08-15: 10:00:00 via TOPICAL

## 2013-08-15 MED ORDER — LACTATED RINGERS IV SOLN
INTRAVENOUS | Status: DC | PRN
  Administered 2013-08-15 (×2): via INTRAVENOUS

## 2013-08-15 MED ORDER — LEVOFLOXACIN IN D5W 750 MG/150ML IV SOLN
750.0000 mg | INTRAVENOUS | Status: AC
  Administered 2013-08-16: 750 mg via INTRAVENOUS
  Filled 2013-08-15 (×2): qty 150

## 2013-08-15 MED ORDER — LACTATED RINGERS IV SOLN: INTRAVENOUS | Status: DC

## 2013-08-15 MED ORDER — ACETAMINOPHEN 160 MG/5ML PO SOLN
1000.0000 mg | Freq: Four times a day (QID) | ORAL | Status: DC
  Administered 2013-08-15: 1000 mg
  Filled 2013-08-15: qty 40.6

## 2013-08-15 MED ORDER — ONDANSETRON HCL 4 MG/2ML IJ SOLN
4.0000 mg | Freq: Four times a day (QID) | INTRAMUSCULAR | Status: DC | PRN
  Administered 2013-08-16 – 2013-08-17 (×5): 4 mg via INTRAVENOUS
  Filled 2013-08-15 (×6): qty 2

## 2013-08-15 MED ORDER — DOCUSATE SODIUM 100 MG PO CAPS
200.0000 mg | ORAL_CAPSULE | Freq: Every day | ORAL | Status: DC
  Administered 2013-08-16 – 2013-08-20 (×5): 200 mg via ORAL
  Filled 2013-08-15 (×6): qty 2

## 2013-08-15 MED ORDER — VANCOMYCIN HCL IN DEXTROSE 1-5 GM/200ML-% IV SOLN
1000.0000 mg | Freq: Once | INTRAVENOUS | Status: AC
  Administered 2013-08-15: 1000 mg via INTRAVENOUS
  Filled 2013-08-15: qty 200

## 2013-08-15 MED ORDER — ACETAMINOPHEN 160 MG/5ML PO SOLN: 650.0000 mg | Freq: Once | ORAL | Status: AC

## 2013-08-15 MED ORDER — SODIUM CHLORIDE 0.9 % IV SOLN
INTRAVENOUS | Status: DC
  Filled 2013-08-15: qty 40

## 2013-08-15 MED ORDER — HEMOSTATIC AGENTS (NO CHARGE) OPTIME
TOPICAL | Status: DC | PRN
  Administered 2013-08-15: 1 via TOPICAL

## 2013-08-15 MED ORDER — GLYCOPYRROLATE 0.2 MG/ML IJ SOLN
INTRAMUSCULAR | Status: DC | PRN
  Administered 2013-08-15 (×2): 0.2 mg via INTRAVENOUS

## 2013-08-15 MED ORDER — SODIUM CHLORIDE 0.9 % IV SOLN
0.5000 g/h | INTRAVENOUS | Status: DC
  Filled 2013-08-15: qty 20

## 2013-08-15 MED ORDER — ALBUMIN HUMAN 5 % IV SOLN
250.0000 mL | INTRAVENOUS | Status: AC | PRN
  Administered 2013-08-15 (×2): 250 mL via INTRAVENOUS

## 2013-08-15 MED ORDER — METOPROLOL TARTRATE 25 MG/10 ML ORAL SUSPENSION
12.5000 mg | Freq: Two times a day (BID) | ORAL | Status: DC
  Filled 2013-08-15 (×3): qty 5

## 2013-08-15 MED ORDER — DOPAMINE-DEXTROSE 3.2-5 MG/ML-% IV SOLN
INTRAVENOUS | Status: DC | PRN
  Administered 2013-08-15: 3 ug/kg/min via INTRAVENOUS

## 2013-08-15 MED ORDER — MAGNESIUM SULFATE 40 MG/ML IJ SOLN
4.0000 g | Freq: Once | INTRAMUSCULAR | Status: AC
  Administered 2013-08-15: 4 g via INTRAVENOUS
  Filled 2013-08-15: qty 100

## 2013-08-15 MED ORDER — ASPIRIN EC 325 MG PO TBEC
325.0000 mg | DELAYED_RELEASE_TABLET | Freq: Every day | ORAL | Status: DC
  Administered 2013-08-16: 325 mg via ORAL
  Filled 2013-08-15 (×2): qty 1

## 2013-08-15 MED ORDER — SODIUM CHLORIDE 0.9 % IJ SOLN
3.0000 mL | Freq: Two times a day (BID) | INTRAMUSCULAR | Status: DC
  Administered 2013-08-16 (×2): 3 mL via INTRAVENOUS

## 2013-08-15 MED ORDER — MILRINONE IN DEXTROSE 20 MG/100ML IV SOLN
0.2000 ug/kg/min | INTRAVENOUS | Status: DC
  Administered 2013-08-15: 0.2 ug/kg/min via INTRAVENOUS
  Filled 2013-08-15 (×2): qty 100

## 2013-08-15 MED ORDER — PHENYLEPHRINE HCL 10 MG/ML IJ SOLN
0.0000 ug/min | INTRAVENOUS | Status: DC
  Administered 2013-08-16: 30 ug/min via INTRAVENOUS
  Filled 2013-08-15 (×2): qty 2

## 2013-08-15 MED ORDER — SODIUM CHLORIDE 0.9 % IV SOLN
INTRAVENOUS | Status: DC
  Filled 2013-08-15: qty 1

## 2013-08-15 MED ORDER — DEXMEDETOMIDINE HCL IN NACL 400 MCG/100ML IV SOLN
0.4000 ug/kg/h | INTRAVENOUS | Status: DC
  Filled 2013-08-15: qty 100

## 2013-08-15 MED ORDER — DEXMEDETOMIDINE HCL IN NACL 200 MCG/50ML IV SOLN: 0.1000 ug/kg/h | INTRAVENOUS | Status: DC

## 2013-08-15 MED ORDER — MILRINONE IN DEXTROSE 20 MG/100ML IV SOLN
0.1250 ug/kg/min | INTRAVENOUS | Status: DC
  Filled 2013-08-15: qty 100

## 2013-08-15 MED ORDER — SODIUM CHLORIDE 0.9 % IR SOLN
Status: DC | PRN
  Administered 2013-08-15: 3000 mL

## 2013-08-15 MED ORDER — FENTANYL CITRATE 0.05 MG/ML IJ SOLN
INTRAMUSCULAR | Status: DC | PRN
  Administered 2013-08-15: 50 ug via INTRAVENOUS
  Administered 2013-08-15 (×4): 250 ug via INTRAVENOUS
  Administered 2013-08-15: 200 ug via INTRAVENOUS
  Administered 2013-08-15: 250 ug via INTRAVENOUS

## 2013-08-15 MED ORDER — MORPHINE SULFATE 2 MG/ML IJ SOLN: 1.0000 mg | INTRAMUSCULAR | Status: AC | PRN

## 2013-08-15 MED ORDER — MIDAZOLAM HCL 2 MG/2ML IJ SOLN: 2.0000 mg | INTRAMUSCULAR | Status: DC | PRN

## 2013-08-15 MED ORDER — LACTATED RINGERS IV SOLN: 500.0000 mL | Freq: Once | INTRAVENOUS | Status: AC | PRN

## 2013-08-15 MED ORDER — ACETAMINOPHEN 500 MG PO TABS
1000.0000 mg | ORAL_TABLET | Freq: Four times a day (QID) | ORAL | Status: AC
  Administered 2013-08-16 – 2013-08-20 (×12): 1000 mg via ORAL
  Filled 2013-08-15 (×18): qty 2

## 2013-08-15 MED ORDER — SODIUM CHLORIDE 0.9 % IV SOLN: 250.0000 mL | INTRAVENOUS | Status: DC

## 2013-08-15 MED ORDER — SODIUM CHLORIDE 0.9 % IJ SOLN: 3.0000 mL | INTRAMUSCULAR | Status: DC | PRN

## 2013-08-15 MED ORDER — MIDAZOLAM HCL 5 MG/5ML IJ SOLN
INTRAMUSCULAR | Status: DC | PRN
  Administered 2013-08-15: 2 mg via INTRAVENOUS
  Administered 2013-08-15: 3 mg via INTRAVENOUS
  Administered 2013-08-15 (×2): 2 mg via INTRAVENOUS
  Administered 2013-08-15: 1 mg via INTRAVENOUS
  Administered 2013-08-15 (×4): 2 mg via INTRAVENOUS
  Administered 2013-08-15 (×2): 1 mg via INTRAVENOUS

## 2013-08-15 MED ORDER — LACTATED RINGERS IV SOLN
INTRAVENOUS | Status: DC | PRN
  Administered 2013-08-15: 06:00:00 via INTRAVENOUS

## 2013-08-15 SURGICAL SUPPLY — 129 items
ADAPTER CARDIO PERF ANTE/RETRO (ADAPTER) ×6 IMPLANT
ANTEGRADE CPLG (MISCELLANEOUS) ×3 IMPLANT
APPLICATOR COTTON TIP 6IN STRL (MISCELLANEOUS) IMPLANT
ATTRACTOMAT 16X20 MAGNETIC DRP (DRAPES) ×3 IMPLANT
BAG DECANTER FOR FLEXI CONT (MISCELLANEOUS) ×6 IMPLANT
BLADE CORE FAN STRYKER (BLADE) ×3 IMPLANT
BLADE OSCILLATING /SAGITTAL (BLADE) ×3 IMPLANT
BLADE STERNUM SYSTEM 6 (BLADE) ×9 IMPLANT
BLADE SURG 11 STRL SS (BLADE) ×3 IMPLANT
CANISTER SUCTION 2500CC (MISCELLANEOUS) ×3 IMPLANT
CANN PRFSN 3/8X14X24FR PCFC (MISCELLANEOUS)
CANN PRFSN 3/8XCNCT ST RT ANG (MISCELLANEOUS)
CANNULA EZ GLIDE AORTIC 21FR (CANNULA) ×3 IMPLANT
CANNULA GUNDRY RCSP 15FR (MISCELLANEOUS) ×3 IMPLANT
CANNULA PRFSN 3/8X14X24FR PCFC (MISCELLANEOUS) IMPLANT
CANNULA PRFSN 3/8XCNCT RT ANG (MISCELLANEOUS) IMPLANT
CANNULA SOFTFLOW AORTIC 7M21FR (CANNULA) ×3 IMPLANT
CANNULA VEN MTL TIP RT (MISCELLANEOUS)
CANNULA VRC MALB SNGL STG 28FR (MISCELLANEOUS) ×2 IMPLANT
CARDIOBLATE CARDIAC ABLATION (MISCELLANEOUS)
CATH CPB KIT OWEN (MISCELLANEOUS) IMPLANT
CATH HEART VENT LEFT (CATHETERS) ×2 IMPLANT
CATH ROBINSON RED A/P 18FR (CATHETERS) ×3 IMPLANT
CATH THORACIC 28FR RT ANG (CATHETERS) IMPLANT
CATH THORACIC 36FR (CATHETERS) ×6 IMPLANT
CATH THORACIC 36FR RT ANG (CATHETERS) ×3 IMPLANT
CLAMP ISOLATOR SYNERGY LG (MISCELLANEOUS) IMPLANT
CLIP FOGARTY SPRING 6M (CLIP) IMPLANT
CONN 1/2X1/2X1/2  BEN (MISCELLANEOUS) ×1
CONN 1/2X1/2X1/2 BEN (MISCELLANEOUS) ×2 IMPLANT
CONN 3/8X1/2 ST GISH (MISCELLANEOUS) ×6 IMPLANT
CONN ST 1/4X3/8  BEN (MISCELLANEOUS) ×1
CONN ST 1/4X3/8 BEN (MISCELLANEOUS) ×2 IMPLANT
CONT SPEC 4OZ CLIKSEAL STRL BL (MISCELLANEOUS) ×6 IMPLANT
COR-KNOT ELITE COMBO KIT (Prosthesis & Implant Heart) ×3 IMPLANT
COVER PROBE W GEL 5X96 (DRAPES) ×3 IMPLANT
COVER SURGICAL LIGHT HANDLE (MISCELLANEOUS) ×3 IMPLANT
CRADLE DONUT ADULT HEAD (MISCELLANEOUS) ×3 IMPLANT
DERMABOND ADHESIVE PROPEN (GAUZE/BANDAGES/DRESSINGS) ×1
DERMABOND ADVANCED .7 DNX6 (GAUZE/BANDAGES/DRESSINGS) ×2 IMPLANT
DEVICE CARDIOBLATE CARDIAC ABL (MISCELLANEOUS) IMPLANT
DEVICE SUT CK QUICK LOAD INDV (Prosthesis & Implant Heart) ×18 IMPLANT
DEVICE SUT CK QUICK LOAD MINI (Prosthesis & Implant Heart) ×12 IMPLANT
DRAIN CHANNEL 32F RND 10.7 FF (WOUND CARE) ×6 IMPLANT
DRAPE BILATERAL SPLIT (DRAPES) ×3 IMPLANT
DRAPE CARDIOVASCULAR INCISE (DRAPES)
DRAPE CV SPLIT W-CLR ANES SCRN (DRAPES) ×3 IMPLANT
DRAPE INCISE IOBAN 66X45 STRL (DRAPES) ×3 IMPLANT
DRAPE SLUSH/WARMER DISC (DRAPES) ×3 IMPLANT
DRAPE SRG 135X102X78XABS (DRAPES) IMPLANT
DRSG COVADERM 4X14 (GAUZE/BANDAGES/DRESSINGS) ×3 IMPLANT
ELECT REM PT RETURN 9FT ADLT (ELECTROSURGICAL) ×6
ELECTRODE REM PT RTRN 9FT ADLT (ELECTROSURGICAL) ×4 IMPLANT
GLOVE BIO SURGEON STRL SZ 6 (GLOVE) ×12 IMPLANT
GLOVE BIO SURGEON STRL SZ 6.5 (GLOVE) ×12 IMPLANT
GLOVE BIO SURGEON STRL SZ7 (GLOVE) IMPLANT
GLOVE BIO SURGEON STRL SZ7.5 (GLOVE) IMPLANT
GLOVE ORTHO TXT STRL SZ7.5 (GLOVE) ×9 IMPLANT
GOWN STRL NON-REIN LRG LVL3 (GOWN DISPOSABLE) ×18 IMPLANT
HEMOSTAT POWDER SURGIFOAM 1G (HEMOSTASIS) ×15 IMPLANT
HEMOSTAT SURGICEL 2X4 FIBR (HEMOSTASIS) ×3 IMPLANT
INSERT FOGARTY XLG (MISCELLANEOUS) ×3 IMPLANT
KIT BASIN OR (CUSTOM PROCEDURE TRAY) ×3 IMPLANT
KIT DILATOR VASC 18G NDL (KITS) ×3 IMPLANT
KIT DRAINAGE VACCUM ASSIST (KITS) ×3 IMPLANT
KIT ROOM TURNOVER OR (KITS) ×9 IMPLANT
KIT SUCTION CATH 14FR (SUCTIONS) ×15 IMPLANT
LEAD PACING MYOCARDI (MISCELLANEOUS) ×3 IMPLANT
LINE VENT (MISCELLANEOUS) ×3 IMPLANT
LOOP VESSEL SUPERMAXI WHITE (MISCELLANEOUS) ×3 IMPLANT
MARKER GRAFT CORONARY BYPASS (MISCELLANEOUS) IMPLANT
NS IRRIG 1000ML POUR BTL (IV SOLUTION) ×15 IMPLANT
PACK OPEN HEART (CUSTOM PROCEDURE TRAY) ×3 IMPLANT
PAD ARMBOARD 7.5X6 YLW CONV (MISCELLANEOUS) ×6 IMPLANT
PAD ELECT DEFIB RADIOL ZOLL (MISCELLANEOUS) ×3 IMPLANT
PROBE CRYO2-ABLATION MALLABLE (MISCELLANEOUS) IMPLANT
SEALANT SURG COSEAL 8ML (VASCULAR PRODUCTS) ×3 IMPLANT
SET CARDIOPLEGIA MPS 5001102 (MISCELLANEOUS) ×3 IMPLANT
SET IRRIG TUBING LAPAROSCOPIC (IRRIGATION / IRRIGATOR) ×3 IMPLANT
SOLUTION ANTI FOG 6CC (MISCELLANEOUS) ×3 IMPLANT
SPOGE SURGIFLO 8M (HEMOSTASIS) ×1
SPONGE GAUZE 4X4 12PLY (GAUZE/BANDAGES/DRESSINGS) ×3 IMPLANT
SPONGE LAP 4X18 X RAY DECT (DISPOSABLE) ×3 IMPLANT
SPONGE SURGIFLO 8M (HEMOSTASIS) ×2 IMPLANT
SUCKER INTRACARDIAC WEIGHTED (SUCKER) ×3 IMPLANT
SUT BONE WAX W31G (SUTURE) ×3 IMPLANT
SUT ETHIBON 2 0 V 52N 30 (SUTURE) ×6 IMPLANT
SUT ETHIBON EXCEL 2-0 V-5 (SUTURE) IMPLANT
SUT ETHIBOND 2 0 SH (SUTURE) ×11 IMPLANT
SUT ETHIBOND 2 0 SH 36X2 (SUTURE) ×4 IMPLANT
SUT ETHIBOND 2 0 V4 (SUTURE) IMPLANT
SUT ETHIBOND 2 0V4 GREEN (SUTURE) IMPLANT
SUT ETHIBOND 4 0 RB 1 (SUTURE) IMPLANT
SUT ETHIBOND 4 0 TF (SUTURE) IMPLANT
SUT ETHIBOND 5 0 C 1 30 (SUTURE) IMPLANT
SUT ETHIBOND V-5 VALVE (SUTURE) IMPLANT
SUT ETHIBOND X763 2 0 SH 1 (SUTURE) ×6 IMPLANT
SUT MNCRL AB 3-0 PS2 18 (SUTURE) ×6 IMPLANT
SUT PDS AB 1 CTX 36 (SUTURE) ×6 IMPLANT
SUT PROLENE 3 0 SH 1 (SUTURE) ×6 IMPLANT
SUT PROLENE 3 0 SH DA (SUTURE) ×15 IMPLANT
SUT PROLENE 3 0 SH1 36 (SUTURE) ×18 IMPLANT
SUT PROLENE 4 0 RB 1 (SUTURE) ×6
SUT PROLENE 4 0 SH DA (SUTURE) ×6 IMPLANT
SUT PROLENE 4-0 RB1 .5 CRCL 36 (SUTURE) ×12 IMPLANT
SUT PROLENE 5 0 C 1 36 (SUTURE) IMPLANT
SUT PROLENE 6 0 C 1 30 (SUTURE) ×3 IMPLANT
SUT SILK  1 MH (SUTURE) ×6
SUT SILK 1 MH (SUTURE) ×12 IMPLANT
SUT SILK 2 0 SH CR/8 (SUTURE) IMPLANT
SUT SILK 3 0 SH CR/8 (SUTURE) IMPLANT
SUT STEEL 6MS V (SUTURE) IMPLANT
SUT STEEL STERNAL CCS#1 18IN (SUTURE) ×3 IMPLANT
SUT STEEL SZ 6 DBL 3X14 BALL (SUTURE) ×6 IMPLANT
SUT VIC AB 2-0 CTX 27 (SUTURE) ×6 IMPLANT
SUTURE E-PAK OPEN HEART (SUTURE) ×3 IMPLANT
SYS ATRICLIP LAA EXCLUSION 45 (CLIP) IMPLANT
SYSTEM SAHARA CHEST DRAIN ATS (WOUND CARE) ×6 IMPLANT
TAPE CLOTH SURG 4X10 WHT LF (GAUZE/BANDAGES/DRESSINGS) ×3 IMPLANT
TOWEL OR 17X24 6PK STRL BLUE (TOWEL DISPOSABLE) ×6 IMPLANT
TOWEL OR 17X26 10 PK STRL BLUE (TOWEL DISPOSABLE) ×9 IMPLANT
TRAY FOLEY IC TEMP SENS 14FR (CATHETERS) ×3 IMPLANT
TUBE SUCT INTRACARD DLP 20F (MISCELLANEOUS) ×3 IMPLANT
UNDERPAD 30X30 INCONTINENT (UNDERPADS AND DIAPERS) ×3 IMPLANT
VALVE MAGNA EASE AORTIC 23MM (Prosthesis & Implant Heart) ×3 IMPLANT
VALVE MAGNA MITRAL 27MM (Prosthesis & Implant Heart) ×3 IMPLANT
VENT LEFT HEART 12002 (CATHETERS) ×3
VRC MALLEABLE SINGLE STG 28FR (MISCELLANEOUS) ×3
WATER STERILE IRR 1000ML POUR (IV SOLUTION) ×12 IMPLANT

## 2013-08-15 NOTE — Op Note (Signed)
CARDIOTHORACIC SURGERY OPERATIVE NOTE  Date of Procedure:  08/15/2013  Preoperative Diagnosis:   Severe Recurrent Mitral Regurgitation  Severe Aortic Stenosis  Postoperative Diagnosis: Same  Procedure:   Redo Mitral Valve Replacement   Edwards Magna Mitral Bovine Bioprosthetic Tissue Valve (size 27mm, model #7300TFX, serial #[id number])   Aortic Valve Replacement   Edwards Magna Ease Bovine Bioprosthetic Tissue Valve (size 23mm, model #3300TFX, serial #[id number]   Surgeon: Salvatore Decent. 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 Silvestre Mesi at [facility]. The patient was reportedly left with some residual mitral regurgitation but has done very well clinically until recently. He developed atrial fibrillation several years ago and has been treated with pharmacologic rate control and anticoagulation with Coumadin. Cardiac catheterization performed in 2012 was notable for the absence of significant coronary artery disease but reportedly did demonstrate moderate mitral regurgitation and moderate aortic stenosis. The pullback gradient across the aortic valve measured at catheterization was estimated 20 mm mercury at that time. The patient was recently seen in followup by Dr. Eldridge Dace and noted to complain of some chest tightness and exertional shortness of breath which improved with diuretic therapy. He underwent followup  transthoracic echocardiogram 05/29/2013 that revealed significant progression of aortic stenosis with peak velocity across the aortic valve measured greater than 4 m/s corresponding to a mean transvalvular gradient of 40 mm mercury. Left ventricular systolic function appear preserved with ejection fraction estimated 50-55%. There remained "mild to moderate eccentric mitral valve regurgitation" and moderate tricuspid regurgitation with moderately elevated right ventricular systolic pressure. The patient was referred for surgical consultation to discuss treatment options. He was originally seen in consultation 06/26/2013. Since then he underwent left and right heart catheterization and attempted transesophageal echocardiogram by Dr. Eldridge Dace on 07/05/2013. Transesophageal echocardiogram was aborted because of difficulty passing the scope into the esophagus. Cardiac catheterization confirmed the presence of severe aortic stenosis and was notable for the absence of significant coronary artery disease. Pulmonary artery pressures were elevated and associated with large V waves on wedge tracing, suggestive of the presence of severe mitral regurgitation.  The patient has been seen in consultation and counseled at length regarding the indications, risks and potential benefits of surgery.  All questions have been answered, and the patient provides full informed consent for the operation as described.     DETAILS OF THE OPERATIVE PROCEDURE  Preparation:  The patient is brought to the operating room on the above mentioned date and central monitoring was established by the anesthesia team including placement of Swan-Ganz catheter and radial arterial line. The patient is placed in the supine position on the operating table.  Intravenous antibiotics are administered. General endotracheal anesthesia is induced uneventfully. A Foley catheter is placed.  Baseline transesophageal echocardiogram was performed.  Findings were  notable for severe calcific aortic stenosis and severe mitral regurgitation.  There was evidence of previous mitral valve repair with intact annuloplasty ring. However, there was an eccentric jet  of mitral regurgitation which coursed eccentrically completely around the entire left atrium. There was flow reversal in the pulmonary vein. Left ventricular systolic function appeared normal. There is mild left ventricular hypertrophy. There is trivial aortic insufficiency. There was mild tricuspid regurgitation. The tricuspid annulus was not enlarged and measured approximately 4 cm in diameter. Right ventricular size and function appeared normal.  The patient's chest, abdomen, both groins, and both lower extremities are prepared and draped in a sterile manner. A time out procedure is performed.   Surgical Approach:  A median sternotomy incision was performed.  The sternum is divided using a sagittal saw. Sternal entry was uneventful. The pericardium is opened anteriorly. There are trivial adhesions in the pericardial space anteriorly and inferiorly, but the right middle lobe of right lung is densely adherent to the right atrium. The right atrium is enlarged. Sharp dissection is begun to dissect the ascending aorta away from adjacent structures. The site of previous aortic cannulation is identified and beyond this the ascending aorta appears normal. An appropriate site for cannulation of the aorta just beyond the takeoff of the innominate artery is identified. The remainder of the dissection is performed after cardioplegia bypass has been instituted.   Extracorporeal Cardiopulmonary Bypass and Myocardial Protection:  The right common femoral vein is cannulated using the Seldinger technique and a guidewire advanced into the right atrium using TEE guidance.  The patient is heparinized systemically and the femoral vein cannulated using a 22 Fr long femoral venous cannula.  The ascending aorta is cannulated for  cardiopulmonary bypass.  Adequate heparinization is verified.   The entire pre-bypass portion of the operation was notable for stable hemodynamics.  Cardiopulmonary bypass was begun.  Vacuum assist venous drainage is utilized. Dissection was now begun to separate the right lung from the right atrium. Adhesions are quite dense and it is apparent that the pericardium was not closed at the end of the patient's previous mitral valve repair in 2005. The posterior rim of the pericardium is identified close to the diaphragm but quickly courses posteriorly. Because of the extremely dense adhesions between the lung and the right atrium and the lack of a defined plane with the posterior rim of the pericardium and inability to clearly identify the anatomical location of the phrenic nerve, a decision is made to continue with mitral valve replacement via superior transseptal approach. Dissection is continued inferiorly until the inferior vena cava has been completely encircled with an umbilical tape. Superiorly dissection is performed to encircle the superior vena cava with an umbilical tape. A second venous cannula is placed through the right atrium and advanced up into the superior vena cava.  A cardioplegia cannula is placed in the ascending aorta.  A retrograde cardioplegia cannula is placed through the right atrium into the coronary sinus.  A temperature probe was placed in the interventricular septum.  The patient is cooled to 32C systemic temperature.  The aortic cross clamp is applied and cold blood cardioplegia is delivered initially in an antegrade fashion through the aortic root.   Supplemental cardioplegia is given retrograde through the coronary sinus catheter.  Iced saline slush is applied for topical hypothermia.  The initial cardioplegic arrest is rapid with early diastolic arrest.  Repeat doses of cardioplegia are administered intermittently throughout the entire cross clamp portion of the operation  through the aortic root and through the coronary sinus catheter in order to maintain completely flat electrocardiogram and septal myocardial temperature below 15C.  Myocardial protection was felt to  be excellent.    Aortic and Mitral Valve Replacement:  An oblique transverse aortotomy incision was performed.  The aortic valve was inspected and notable for severe aortic stenosis.  The aortic valve leaflets were excised sharply and the aortic annulus decalcified.  Decalcification was notably straightforward.  The aortic annulus was sized to accept a 23 mm prosthesis.  The aortic root and left ventricle were irrigated with copious cold saline solution.  The femoral venous cannula is pulled down until the tip is just below the junction of the inferior vena cava and the right atrium. Both caval tapes were snared.  An oblique right atriotomy incision is performed and continued superiorly to reach the dome of the left atrium. The incision is extended inferiorly through the interatrial septum to reach the posterior rim of the fossa ovalis. The incision in the dome of the left atrium is now extended across the dome towards the left atrial appendage.  The mitral valve was inspected and notable for Obvious prolapse of both the anterior posterior leaflet with an area of flail in the P2 region of the posterior leaflet. There is billowing bileaflet disease with forme fruste variant of Barlow's disease. There is evidence of previous artificial Gore-Tex cord replacement posteriorly and both cords are elongated but intact.  There is severe mitral regurgitation on saline testing with type II dysfunction.  The previous annuloplasty ring is removed to facilitate further examination the valve. With repeat saline testing there is evidence of excessive redundant leaflet tissue and billowing bileaflet disease with recurrent flail leaflet in the P2 segment of the posterior leaflet. Although redo mitral valve repair appears  technically feasible, the decision is made to proceed with valve replacement because of the high degree of complexity and the need for concomitant aortic valve replacement.  The anterior leaflet of the mitral valve was excised sharply, leaving a small rim of the free margin and the associated primary chords.  The posterior leaflet split in the midline.  The mitral annulus was sized to accept a 27 mm prosthesis.  The left ventricle was irrigated with copious cold saline solution.  Mitral valve replacement was performed using interrupted horizontal mattress 2-0 Ethibond pledgeted sutures with pledgets in the supraannular position.  The remaining portions of the anterior leaflet were incorporated into the suture line laterally, thereby preserving chords to both the anterior and posterior leaflet.  An [facility] Mitral bovine bioprosthetic tissue valve (size 27 mm, model # 7300TFX, serial # [id number]) was implanted uneventfully. All sutures were fixed using the Cor-knot device.  The valve seated appropriately with care to position the commissure posts away from the left ventricular outflow tract.    Aortic valve replacement was performed using interrupted horizontal mattress 2-0 Ethibond pledgeted sutures with pledgets in the subannular position.  An [facility] Ease pericardial tissue valve (size 23 mm, model # 3300TFX, serial # [id number]) was implanted uneventfully. The valve seated appropriately with adequate space beneath the left main and right coronary artery.  Rewarming is begun.  The left atriotomy was closed using a 2-layer closure of running 3-0 Prolene suture after placing a sump drain across the mitral valve to serve as a left ventricular vent.  The aortotomy was closed using a 2-layer closure of running 4-0 Prolene suture.  One final dose of warm retrograde "hot shot" cardioplegia was administered retrograde through the coronary sinus catheter while all air was evacuated through the aortic root.   The aortic cross clamp was removed after a total cross  clamp time of 158 minutes.   Procedure Completion:  The left ventricular vent is removed and the right atriotomy incision is closed.  Epicardial pacing wires are fixed to the right ventricular outflow tract and to the right atrial appendage. The patient is rewarmed to 37C temperature. The aortic vent is removed.  The superior vena cava cannula is removed.  The patient is weaned and disconnected from cardiopulmonary bypass.  The patient's rhythm at separation from bypass was atrial fibrillation.  The patient was weaned from cardioplegic bypass without any inotropic support. Total cardiopulmonary bypass time for the operation was 236 minutes.  Low-dose dopamine infusion was begun after separation from bypass.    Followup transesophageal echocardiogram performed after separation from bypass revealed well-seated mitral and aortic valve prostheses that were functioning normally and without any sign of perivalvular leak.  Left ventricular function was unchanged from preoperatively.  The aortic cannula was removed uneventfully. Protamine was administered to reverse the anticoagulation. The femoral venous cannula was removed and manual pressure held on the groin for 30 minutes.  The mediastinum and pleural space were inspected for hemostasis and irrigated with saline solution. There was small amount of air leak from the surface of the right lung medially along the surface of the right atrium. CoSeal tissue sealant was applied.  The mediastinum and the right pleural space were drained using 4 chest tubes placed through separate stab incisions inferiorly.  The soft tissues anterior to the aorta were reapproximated loosely. The sternum is closed with double strength sternal wire. The soft tissues anterior to the sternum were closed in multiple layers and the skin is closed with a running subcuticular skin closure.   The post-bypass portion of the operation was  notable for stable rhythm and hemodynamics.   No blood products were administered during the operation.   Patient Disposition:  The patient tolerated the procedure well and is transported to the surgical intensive care in stable condition. There are no intraoperative complications. All sponge and instrument counts are verified correct at completion of the operation.  The needle count was incorrect. A portable chest x-ray performed in the operating room prior to transport and notable for the absence of any foreign bodies consistent with retained needle.     Salvatore Decent. Cornelius Moras MD 08/15/2013 3:11 PM

## 2013-08-15 NOTE — Brief Op Note (Addendum)
08/15/2013  1:27 PM  PATIENT:  Jason [name]  79 y.o. male  PRE-OPERATIVE DIAGNOSIS:    Aortic Stenosis  Recurrent Mitral Regurgitation  POST-OPERATIVE DIAGNOSIS:    Aortic Stenosis  Recurrent Mitral Regurgitation  PROCEDURE:  Procedure(s) with comments:  AORTIC VALVE REPLACEMENT (AVR) -23 mm Edwards Magna Ease Aortic Pericardial Tissue Valve  REDO MITRAL VALVE (MV) REPLACEMENT (N/A) -27 mm Edwards Magna Mitral Pericardial Valve   SURGEON:    Purcell Nails, MD  ASSISTANTS:  Lowella Dandy, PA-C  ANESTHESIA:   Achille Rich, MD  CROSSCLAMP TIME:   158'  CARDIOPULMONARY BYPASS TIME: 236'  FINDINGS:  Severe calcific aortic stenosis  Type II mitral valve dysfunction with recurrent mitral valve prolapse  Severe mitral regurgitation  Normal LV systolic function  Mild tricuspid regurgitation  Dense adhesions between right lung and right atrium  COMPLICATIONS: None  BASELINE WEIGHT: 78 kg  PATIENT DISPOSITION:   TO SICU IN STABLE CONDITION  Jason [name],Jason [name] 08/15/2013 3:06 PM

## 2013-08-15 NOTE — Transfer of Care (Signed)
Immediate Anesthesia Transfer of Care Note  Patient: Jason [name]  Procedure(s) Performed: Procedure(s): AORTIC VALVE REPLACEMENT (AVR) (N/A) REDO MITRAL VALVE (MV) REPLACEMENT (N/A) INTRAOPERATIVE TRANSESOPHAGEAL ECHOCARDIOGRAM (N/A)  Patient Location: SICU  Anesthesia Type:General  Level of Consciousness: sedated and Patient remains intubated per anesthesia plan  Airway & Oxygen Therapy: Patient remains intubated per anesthesia plan and Patient placed on Ventilator (see vital sign flow sheet for setting)  Post-op Assessment: Report given to PACU RN and Post -op Vital signs reviewed and stable  Post vital signs: Reviewed and stable  Complications: No apparent anesthesia complications

## 2013-08-15 NOTE — Anesthesia Preprocedure Evaluation (Addendum)
Anesthesia Evaluation  Patient identified by MRN, date of birth, ID band Patient awake    Reviewed: Allergy & Precautions, H&P , NPO status , Patient's Chart, lab work & pertinent test results  History of Anesthesia Complications (+) PONV and history of anesthetic complications  Airway Mallampati: II TM Distance: >3 FB Neck ROM: Full    Dental  (+) Teeth Intact and Dental Advisory Given   Pulmonary shortness of breath and with exertion, former smoker,          Cardiovascular hypertension, Pt. on medications + dysrhythmias Atrial Fibrillation + Valvular Problems/Murmurs AS and MR Rhythm:Irregular  S/p minimally invasive mitral valve repair.   Neuro/Psych    GI/Hepatic hiatal hernia, GERD-  Medicated and Controlled,  Endo/Other    Renal/GU      Musculoskeletal   Abdominal   Peds  Hematology   Anesthesia Other Findings   Reproductive/Obstetrics                          Anesthesia Physical Anesthesia Plan  ASA: IV  Anesthesia Plan: General   Post-op Pain Management:    Induction: Intravenous  Airway Management Planned: Oral ETT  Additional Equipment: Arterial line, CVP, PA Cath, TEE, 3D TEE and Ultrasound Guidance Line Placement  Intra-op Plan:   Post-operative Plan: Post-operative intubation/ventilation  Informed Consent: I have reviewed the patients History and Physical, chart, labs and discussed the procedure including the risks, benefits and alternatives for the proposed anesthesia with the patient or authorized representative who has indicated his/her understanding and acceptance.   Dental advisory given  Plan Discussed with: Anesthesiologist and Surgeon  Anesthesia Plan Comments:        Anesthesia Quick Evaluation

## 2013-08-15 NOTE — Interval H&P Note (Signed)
History and Physical Interval Note:  08/15/2013 6:15 AM  Jason [name]  has presented today for surgery, with the diagnosis of AS MR TR AFIB   The various methods of treatment have been discussed with the patient and family. After consideration of risks, benefits and other options for treatment, the patient has consented to  Procedure(s) with comments: REDO STERNOTOMY (N/A) AORTIC VALVE REPLACEMENT (AVR) (N/A) REDO MITRAL VALVE (MV) REPAIR (N/A) - REPAIR OR REPLACEMENT TRICUSPID VALVE REPAIR (N/A) MAZE (N/A) INTRAOPERATIVE TRANSESOPHAGEAL ECHOCARDIOGRAM (N/A) as a surgical intervention .  The patient's history has been reviewed, patient examined, no change in status, stable for surgery.  I have reviewed the patient's chart and labs.  Questions were answered to the patient's satisfaction.     OWEN,CLARENCE H

## 2013-08-15 NOTE — Progress Notes (Signed)
CT Surg pm rounds  Stable , sedated on vent Weaning Fio2 for sats > 94% Stable hemodynamics

## 2013-08-15 NOTE — Progress Notes (Signed)
Precedex remains at 0.2.  FiO2 at 70%.  Attempting to wean per protocol.  Patient is currently not meeting parameters to wean.  Tommi Emery

## 2013-08-15 NOTE — Preoperative (Signed)
Beta Blockers   Reason not to administer Beta Blockers:Not Applicable 

## 2013-08-15 NOTE — Progress Notes (Signed)
Utilization Review Completed.Draxton Luu T11/01/2013  

## 2013-08-15 NOTE — Progress Notes (Signed)
  Echocardiogram Echocardiogram Transesophageal has been performed.  Margreta Journey 08/15/2013, 12:02 PM

## 2013-08-15 NOTE — Anesthesia Procedure Notes (Signed)
Procedure Name: Intubation Date/Time: 08/15/2013 7:48 AM Performed by: Gayla Medicus Pre-anesthesia Checklist: Patient identified, Timeout performed, Emergency Drugs available, Suction available and Patient being monitored Patient Re-evaluated:Patient Re-evaluated prior to inductionOxygen Delivery Method: Circle system utilized Preoxygenation: Pre-oxygenation with 100% oxygen Intubation Type: IV induction Ventilation: Mask ventilation without difficulty and Oral airway inserted - appropriate to patient size Laryngoscope Size: Mac and 3 Grade View: Grade II Tube type: Oral Tube size: 8.0 mm Number of attempts: 1 Airway Equipment and Method: Stylet Placement Confirmation: ETT inserted through vocal cords under direct vision,  positive ETCO2 and breath sounds checked- equal and bilateral Secured at: 22 cm Tube secured with: Tape Dental Injury: Teeth and Oropharynx as per pre-operative assessment

## 2013-08-16 ENCOUNTER — Inpatient Hospital Stay (HOSPITAL_COMMUNITY): Payer: Medicare Other

## 2013-08-16 ENCOUNTER — Encounter: Payer: Self-pay | Admitting: Interventional Cardiology

## 2013-08-16 LAB — POCT I-STAT 3, ART BLOOD GAS (G3+)
Acid-base deficit: 3 mmol/L — ABNORMAL HIGH (ref 0.0–2.0)
Acid-base deficit: 3 mmol/L — ABNORMAL HIGH (ref 0.0–2.0)
Bicarbonate: 20.7 mEq/L (ref 20.0–24.0)
Bicarbonate: 22.4 mEq/L (ref 20.0–24.0)
O2 Saturation: 94 %
O2 Saturation: 93 %
Patient temperature: 37.4
Patient temperature: 37.7
TCO2: 22 mmol/L (ref 0–100)
TCO2: 24 mmol/L (ref 0–100)
pCO2 arterial: 32 mmHg — ABNORMAL LOW (ref 35.0–45.0)
pCO2 arterial: 41.6 mmHg (ref 35.0–45.0)
pH, Arterial: 7.42 (ref 7.350–7.450)
pH, Arterial: 7.342 — ABNORMAL LOW (ref 7.350–7.450)
pO2, Arterial: 72 mmHg — ABNORMAL LOW (ref 80.0–100.0)
pO2, Arterial: 74 mmHg — ABNORMAL LOW (ref 80.0–100.0)

## 2013-08-16 LAB — GLUCOSE, CAPILLARY
Glucose-Capillary: 106 mg/dL — ABNORMAL HIGH (ref 70–99)
Glucose-Capillary: 123 mg/dL — ABNORMAL HIGH (ref 70–99)
Glucose-Capillary: 131 mg/dL — ABNORMAL HIGH (ref 70–99)
Glucose-Capillary: 133 mg/dL — ABNORMAL HIGH (ref 70–99)
Glucose-Capillary: 137 mg/dL — ABNORMAL HIGH (ref 70–99)
Glucose-Capillary: 141 mg/dL — ABNORMAL HIGH (ref 70–99)
Glucose-Capillary: 141 mg/dL — ABNORMAL HIGH (ref 70–99)
Glucose-Capillary: 144 mg/dL — ABNORMAL HIGH (ref 70–99)
Glucose-Capillary: 99 mg/dL (ref 70–99)

## 2013-08-16 LAB — BASIC METABOLIC PANEL
BUN: 15 mg/dL (ref 6–23)
CO2: 22 mEq/L (ref 19–32)
Calcium: 8.2 mg/dL — ABNORMAL LOW (ref 8.4–10.5)
Chloride: 108 mEq/L (ref 96–112)
Creatinine, Ser: 1.05 mg/dL (ref 0.50–1.35)
GFR calc Af Amer: 76 mL/min — ABNORMAL LOW (ref >90)
GFR calc non Af Amer: 66 mL/min — ABNORMAL LOW (ref >90)
Glucose, Bld: 145 mg/dL — ABNORMAL HIGH (ref 70–99)
Potassium: 4.3 mEq/L (ref 3.5–5.1)
Sodium: 140 mEq/L (ref 135–145)

## 2013-08-16 LAB — POCT I-STAT, CHEM 8
BUN: 20 mg/dL (ref 6–23)
Calcium, Ion: 1.3 mmol/L (ref 1.13–1.30)
Chloride: 109 mEq/L (ref 96–112)
Creatinine, Ser: 1.3 mg/dL (ref 0.50–1.35)
Glucose, Bld: 150 mg/dL — ABNORMAL HIGH (ref 70–99)
HCT: 26 % — ABNORMAL LOW (ref 39.0–52.0)
Hemoglobin: 8.8 g/dL — ABNORMAL LOW (ref 13.0–17.0)
Potassium: 4.7 mEq/L (ref 3.5–5.1)
Sodium: 137 mEq/L (ref 135–145)
TCO2: 24 mmol/L (ref 0–100)

## 2013-08-16 LAB — CBC
HCT: 31 % — ABNORMAL LOW (ref 39.0–52.0)
HCT: 28.8 % — ABNORMAL LOW (ref 39.0–52.0)
Hemoglobin: 10.5 g/dL — ABNORMAL LOW (ref 13.0–17.0)
Hemoglobin: 9.5 g/dL — ABNORMAL LOW (ref 13.0–17.0)
MCH: 31.2 pg (ref 26.0–34.0)
MCH: 30.7 pg (ref 26.0–34.0)
MCHC: 33.9 g/dL (ref 30.0–36.0)
MCHC: 33 g/dL (ref 30.0–36.0)
MCV: 92 fL (ref 78.0–100.0)
MCV: 93.2 fL (ref 78.0–100.0)
Platelets: 85 10*3/uL — ABNORMAL LOW (ref 150–400)
Platelets: 73 10*3/uL — ABNORMAL LOW (ref 150–400)
RBC: 3.37 MIL/uL — ABNORMAL LOW (ref 4.22–5.81)
RBC: 3.09 MIL/uL — ABNORMAL LOW (ref 4.22–5.81)
RDW: 14.4 % (ref 11.5–15.5)
RDW: 14.7 % (ref 11.5–15.5)
WBC: 16 10*3/uL — ABNORMAL HIGH (ref 4.0–10.5)
WBC: 13.1 10*3/uL — ABNORMAL HIGH (ref 4.0–10.5)

## 2013-08-16 LAB — MAGNESIUM
Magnesium: 2.6 mg/dL — ABNORMAL HIGH (ref 1.5–2.5)
Magnesium: 2.6 mg/dL — ABNORMAL HIGH (ref 1.5–2.5)

## 2013-08-16 LAB — CREATININE, SERUM
Creatinine, Ser: 1.22 mg/dL (ref 0.50–1.35)
GFR calc Af Amer: 64 mL/min — ABNORMAL LOW (ref >90)
GFR calc non Af Amer: 55 mL/min — ABNORMAL LOW (ref >90)

## 2013-08-16 MED ORDER — MORPHINE SULFATE 2 MG/ML IJ SOLN
2.0000 mg | INTRAMUSCULAR | Status: DC | PRN
  Administered 2013-08-17 – 2013-08-18 (×2): 2 mg via INTRAVENOUS
  Filled 2013-08-16 (×2): qty 1

## 2013-08-16 MED ORDER — MIDAZOLAM HCL 2 MG/2ML IJ SOLN
2.0000 mg | Freq: Once | INTRAMUSCULAR | Status: AC
  Administered 2013-08-16: 2 mg via INTRAVENOUS
  Filled 2013-08-16: qty 2

## 2013-08-16 MED ORDER — INSULIN ASPART 100 UNIT/ML ~~LOC~~ SOLN: 0.0000 [IU] | SUBCUTANEOUS | Status: DC

## 2013-08-16 MED ORDER — WARFARIN SODIUM 2.5 MG PO TABS
2.5000 mg | ORAL_TABLET | Freq: Once | ORAL | Status: DC
  Administered 2013-08-16: 2.5 mg via ORAL
  Filled 2013-08-16: qty 1

## 2013-08-16 MED ORDER — FUROSEMIDE 10 MG/ML IJ SOLN
20.0000 mg | Freq: Four times a day (QID) | INTRAMUSCULAR | Status: AC
  Administered 2013-08-16 – 2013-08-17 (×3): 20 mg via INTRAVENOUS
  Filled 2013-08-16 (×4): qty 2

## 2013-08-16 MED ORDER — WARFARIN - PHYSICIAN DOSING INPATIENT: Freq: Every day | Status: DC

## 2013-08-16 MED FILL — Electrolyte-R (PH 7.4) Solution: INTRAVENOUS | Qty: 4000 | Status: AC

## 2013-08-16 MED FILL — Potassium Chloride Inj 2 mEq/ML: INTRAVENOUS | Qty: 40 | Status: AC

## 2013-08-16 MED FILL — Heparin Sodium (Porcine) Inj 1000 Unit/ML: INTRAMUSCULAR | Qty: 10 | Status: AC

## 2013-08-16 MED FILL — Sodium Chloride IV Soln 0.9%: INTRAVENOUS | Qty: 1000 | Status: AC

## 2013-08-16 MED FILL — Magnesium Sulfate Inj 50%: INTRAMUSCULAR | Qty: 10 | Status: AC

## 2013-08-16 MED FILL — Heparin Sodium (Porcine) Inj 1000 Unit/ML: INTRAMUSCULAR | Qty: 30 | Status: AC

## 2013-08-16 MED FILL — Mannitol IV Soln 20%: INTRAVENOUS | Qty: 500 | Status: AC

## 2013-08-16 MED FILL — Sodium Chloride Irrigation Soln 0.9%: Qty: 3000 | Status: AC

## 2013-08-16 MED FILL — Lidocaine HCl IV Inj 20 MG/ML: INTRAVENOUS | Qty: 5 | Status: AC

## 2013-08-16 MED FILL — Sodium Bicarbonate IV Soln 8.4%: INTRAVENOUS | Qty: 50 | Status: AC

## 2013-08-16 NOTE — Plan of Care (Signed)
Problem: Phase II Progression Outcomes Goal: CBGs/Blood glucose < or equal to 120 Outcome: Progressing CBGs 130s with sliding scale coverage q4h. Not eating much of clear liquid diet.    Goal: Tolerates liquids without nausea/vomiting Outcome: Progressing Has had some nausea with activity, resolved with Zofran. Goal: Patient extubated within - Outcome: Completed/Met Date Met:  08/16/13 Extubated within 6-12 hours.

## 2013-08-16 NOTE — Procedures (Signed)
Extubation Procedure Note  Patient Details:   Name: CHRSITOPHER WIK DOB: Nov 21, 1931 MRN: [id number]   Airway Documentation:     Evaluation  O2 sats: stable throughout Complications: No apparent complications Patient did tolerate procedure well. Bilateral Breath Sounds: Clear Suctioning: Airway Yes  VC 800 NIF >20 IS 750 X3 Fidela Salisbury 08/16/2013, 2:43 AM

## 2013-08-16 NOTE — Progress Notes (Signed)
Patient ID: Jason [name], male   DOB: Apr 04, 1932, 79 y.o.   MRN: [id number] EVENING ROUNDS NOTE :     [address]       [name] [address]             [phone]                 1 Day Post-Op Procedure(s) (LRB): AORTIC VALVE REPLACEMENT (AVR) (N/A) REDO MITRAL VALVE (MV) REPLACEMENT (N/A) INTRAOPERATIVE TRANSESOPHAGEAL ECHOCARDIOGRAM (N/A)  Total Length of Stay:  LOS: 1 day  BP 83/51  Pulse 65  Temp(Src) 98.4 F (36.9 C) (Core (Comment))  Resp 10  Ht 5\' 8"  (1.727 m)  Wt 177 lb 14.6 oz (80.7 kg)  BMI 27.06 kg/m2  SpO2 99%  .Intake/Output     11/04 0701 - 11/05 0700 11/05 0701 - 11/06 0700   P.O.  360   I.V. (mL/kg) 3836.8 (47.5) 146.2 (1.8)   Blood 656    NG/GT 30    IV Piggyback 1600 150   Total Intake(mL/kg) 6122.8 (75.9) 656.2 (8.1)   Urine (mL/kg/hr) 2615 (1.4) 355 (0.5)   Blood 2150 (1.1)    Chest Tube 620 (0.3) 230 (0.3)   Total Output 5385 585   Net +737.8 +71.2          . sodium chloride    . DOPamine 1 mcg/kg/min (08/16/13 1600)  . milrinone Stopped (08/16/13 1115)     Lab Results  Component Value Date   WBC 16.0* 08/16/2013   HGB 8.8* 08/16/2013   HCT 26.0* 08/16/2013   PLT 85* 08/16/2013   GLUCOSE 150* 08/16/2013   ALT 23 08/11/2013   AST 34 08/11/2013   NA 137 08/16/2013   K 4.7 08/16/2013   CL 109 08/16/2013   CREATININE 1.30 08/16/2013   BUN 20 08/16/2013   CO2 22 08/16/2013   INR 2.00* 08/15/2013   HGBA1C 5.4 08/11/2013   In afib  stable  Delight Ovens MD  Beeper [phone] Office [phone] 08/16/2013 4:42 PM

## 2013-08-16 NOTE — Progress Notes (Addendum)
[facility] DAILY ICU PROGRESS NOTE                   [address]            [name] [address]          [phone]   1 Day Post-Op Procedure(s) (LRB): AORTIC VALVE REPLACEMENT (AVR) (N/A) REDO MITRAL VALVE (MV) REPLACEMENT (N/A) INTRAOPERATIVE TRANSESOPHAGEAL ECHOCARDIOGRAM (N/A)  Total Length of Stay:  LOS: 1 day   Subjective: Awake, alert and extubated.  Sore and mildly nauseated.  In AF, rates around 100.   Objective: Vital signs in last 24 hours: Temp:  [97.5 F (36.4 C)-99.9 F (37.7 C)] 99.5 F (37.5 C) (11/05 0730) Pulse Rate:  [30-116] 35 (11/05 0730) Cardiac Rhythm:  [-] Atrial fibrillation (11/05 0730) Resp:  [10-20] 12 (11/05 0730) BP: (81-107)/(46-72) 105/62 mmHg (11/05 0700) SpO2:  [90 %-100 %] 93 % (11/05 0730) Arterial Line BP: (80-146)/(41-75) 98/51 mmHg (11/05 0730) FiO2 (%):  [40 %-80 %] 40 % (11/05 0210) Weight:  [177 lb 14.6 oz (80.7 kg)] 177 lb 14.6 oz (80.7 kg) (11/05 0451)  Filed Weights   08/14/13 1300 08/16/13 0451  Weight: 172 lb 8 oz (78.245 kg) 177 lb 14.6 oz (80.7 kg)  BASELINE WEIGHT: 78 kg   Weight change: 5 lb 6.6 oz (2.455 kg)   Hemodynamic parameters for last 24 hours: PAP: (39-58)/(20-31) 53/26 mmHg CO:  [3.1 L/min-6.2 L/min] 5.7 L/min CI:  [1.6 L/min/m2-3.2 L/min/m2] 2.9 L/min/m2  Intake/Output from previous day: 11/04 0701 - 11/05 0700 In: 6122.8 [I.V.:3836.8; Blood:656; NG/GT:30; IV Piggyback:1600] Out: 5385 [Urine:2615; Blood:2150; Chest Tube:620]  CBGs 91-99-106-129-131-141-145-123     Current Meds: Scheduled Meds: . acetaminophen  1,000 mg Oral Q6H   Or  . acetaminophen (TYLENOL) oral liquid 160 mg/5 mL  1,000 mg Per Tube Q6H  . aspirin EC  325 mg Oral Daily   Or  . aspirin  324 mg Per Tube Daily  . bisacodyl  10 mg Oral Daily   Or  . bisacodyl  10 mg Rectal Daily  . docusate sodium  200 mg Oral Daily  . insulin aspart  0-24 Units Subcutaneous Q4H  . levofloxacin (LEVAQUIN) IV  750 mg Intravenous  Q24H  . metoprolol tartrate  12.5 mg Oral BID   Or  . metoprolol tartrate  12.5 mg Per Tube BID  . [START ON 08/17/2013] pantoprazole  40 mg Oral Daily  . sodium chloride  3 mL Intravenous Q12H   Continuous Infusions: . sodium chloride 20 mL/hr at 08/15/13 1900  . sodium chloride 10 mL/hr at 08/16/13 0400  . sodium chloride    . dexmedetomidine Stopped (08/16/13 0000)  . DOPamine 3 mcg/kg/min (08/16/13 0700)  . lactated ringers 20 mL/hr at 08/15/13 1900  . milrinone 0.2 mcg/kg/min (08/16/13 0700)  . nitroGLYCERIN Stopped (08/15/13 1545)  . phenylephrine (NEO-SYNEPHRINE) Adult infusion Stopped (08/16/13 0500)   PRN Meds:.albumin human, metoprolol, midazolam, morphine injection, ondansetron (ZOFRAN) IV, oxyCODONE, sodium chloride   Physical Exam: General appearance: alert, cooperative and no distress Heart: irregularly irregular rhythm Lungs: diminished breath sounds bibasilar Extremities: Trace LE edema Wound: Dressed and dry  Lab Results: CBC: Recent Labs  08/15/13 2130 08/16/13 0348  WBC 18.7* 16.0*  HGB 10.3*  9.9* 10.5*  HCT 30.6*  29.0* 31.0*  PLT 74* 85*   BMET:  Recent Labs  08/15/13 2130 08/16/13 0348  NA 142 140  K 4.5 4.3  CL 108 108  CO2  --  22  GLUCOSE 131* 145*  BUN 15 15  CREATININE 1.02  1.20 1.05  CALCIUM  --  8.2*    PT/INR:  Recent Labs  08/15/13 1500  LABPROT 22.1*  INR 2.00*   Radiology: Dg Chest Portable 1 View In Am  08/16/2013   CLINICAL DATA:  Postop aortic valve replacement and mitral valve replacement  EXAM: PORTABLE CHEST - 1 VIEW  COMPARISON:  Portable chest x-ray of 08/15/2013  FINDINGS: The endotracheal tube has been removed. Swan-Ganz catheter remains. The lungs are not as well aerated with increase in basilar atelectasis. No pneumothorax is seen. Cardiomegaly is stable.  IMPRESSION: Endotracheal tube removed. Diminished aeration with increase in basilar atelectasis.   Electronically Signed   By: Dwyane Dee M.D.   On:  08/16/2013 08:06   Dg Chest Portable 1 View  08/15/2013   CLINICAL DATA:  Incorrect instrument count - missing two suture needles  EXAM: PORTABLE CHEST - 1 VIEW  COMPARISON:  Prior chest x-ray 08/11/2013  FINDINGS: Interval surgical changes of median sternotomy with new mitral and aortic valves. A right IJ approach vascular sheath conveys a Swan-Ganz catheter into the heart. The tip of the Swan overlies the main pulmonary outflow tract. The patient is intubated. The tip of the ET tube is 3.1 cm above the carina. Two mediastinal drains and bilateral thoracostomy tubes are present via a subxiphoid approach. Very mild pulmonary vascular congestion without overt edema. No pneumothorax. Cardiac and mediastinal contours are slightly more prominent than previously seen, likely related to portable frontal technique. No acute osseous abnormality. No curvilinear radiopaque foreign body is identified which would correspond with the missing a suture needles. There are multiple the metallic vascular clips in the right hilar region.  IMPRESSION: 1. No radiopaque metallic foreign body resembling a suture needle is identified. 2. Satisfactory position of support apparatus as detailed above. 3. Mild vascular congestion without overt edema. 4. Surgical changes of median sternotomy with aortic and mitral valve replacement.   Electronically Signed   By: Malachy Moan M.D.   On: 08/15/2013 15:34     Assessment/Plan: S/P Procedure(s) (LRB): AORTIC VALVE REPLACEMENT (AVR) (N/A) REDO MITRAL VALVE (MV) REPLACEMENT (N/A) INTRAOPERATIVE TRANSESOPHAGEAL ECHOCARDIOGRAM (N/A) CV- SBPs borderline, still on low dose Dopamine, Milrinone gtts.  Wean as tolerated.  Chronic AF, rates 90-100s.  Resume meds as able for rate control. Vol overload- diurese as BP tolerates. Expected postop blood loss anemia- H/H fairly stable. Postop thrombocytopenia - watch plt count. Endo- CBGs relatively stable.  No h/o DM.  (A1C=5.4) Mobilize, wean  lines and tubes, routine POD#1 progression.  COLLINS,GINA H 08/16/2013 8:20 AM  I have seen and examined the patient and agree with the assessment and plan as outlined.  Overall looks good POD1 although right hemidiaphragm looks high on CXR w/ significant RLL atelectasis. + small air leak as expected.   Mobilize.  Wean drips.  Pulm toilet.  Brentt Fread H 08/16/2013 9:43 AM

## 2013-08-17 ENCOUNTER — Encounter (HOSPITAL_COMMUNITY): Payer: Self-pay | Admitting: Thoracic Surgery (Cardiothoracic Vascular Surgery)

## 2013-08-17 ENCOUNTER — Inpatient Hospital Stay (HOSPITAL_COMMUNITY): Payer: Medicare Other

## 2013-08-17 LAB — BASIC METABOLIC PANEL
BUN: 25 mg/dL — ABNORMAL HIGH (ref 6–23)
CO2: 24 mEq/L (ref 19–32)
Calcium: 8.9 mg/dL (ref 8.4–10.5)
Chloride: 106 mEq/L (ref 96–112)
Creatinine, Ser: 1.42 mg/dL — ABNORMAL HIGH (ref 0.50–1.35)
GFR calc Af Amer: 53 mL/min — ABNORMAL LOW (ref >90)
GFR calc non Af Amer: 46 mL/min — ABNORMAL LOW (ref >90)
Glucose, Bld: 126 mg/dL — ABNORMAL HIGH (ref 70–99)
Potassium: 4.9 mEq/L (ref 3.5–5.1)
Sodium: 140 mEq/L (ref 135–145)

## 2013-08-17 LAB — PROTIME-INR
INR: 1.3 (ref 0.00–1.49)
Prothrombin Time: 15.9 seconds — ABNORMAL HIGH (ref 11.6–15.2)

## 2013-08-17 LAB — CBC
HCT: 30.1 % — ABNORMAL LOW (ref 39.0–52.0)
Hemoglobin: 9.7 g/dL — ABNORMAL LOW (ref 13.0–17.0)
MCH: 30.3 pg (ref 26.0–34.0)
MCHC: 32.2 g/dL (ref 30.0–36.0)
MCV: 94.1 fL (ref 78.0–100.0)
Platelets: 74 10*3/uL — ABNORMAL LOW (ref 150–400)
RBC: 3.2 MIL/uL — ABNORMAL LOW (ref 4.22–5.81)
RDW: 14.7 % (ref 11.5–15.5)
WBC: 15.5 10*3/uL — ABNORMAL HIGH (ref 4.0–10.5)

## 2013-08-17 LAB — GLUCOSE, CAPILLARY
Glucose-Capillary: 123 mg/dL — ABNORMAL HIGH (ref 70–99)
Glucose-Capillary: 124 mg/dL — ABNORMAL HIGH (ref 70–99)
Glucose-Capillary: 135 mg/dL — ABNORMAL HIGH (ref 70–99)
Glucose-Capillary: 119 mg/dL — ABNORMAL HIGH (ref 70–99)
Glucose-Capillary: 121 mg/dL — ABNORMAL HIGH (ref 70–99)

## 2013-08-17 MED ORDER — BISACODYL 5 MG PO TBEC: 10.0000 mg | DELAYED_RELEASE_TABLET | Freq: Every day | ORAL | Status: DC | PRN

## 2013-08-17 MED ORDER — POTASSIUM CHLORIDE CRYS ER 20 MEQ PO TBCR
20.0000 meq | EXTENDED_RELEASE_TABLET | Freq: Every day | ORAL | Status: DC
  Administered 2013-08-18: 20 meq via ORAL
  Filled 2013-08-17 (×2): qty 1

## 2013-08-17 MED ORDER — TRAMADOL HCL 50 MG PO TABS
50.0000 mg | ORAL_TABLET | ORAL | Status: DC | PRN
  Filled 2013-08-17: qty 1

## 2013-08-17 MED ORDER — FUROSEMIDE 40 MG PO TABS
40.0000 mg | ORAL_TABLET | Freq: Every day | ORAL | Status: DC
  Filled 2013-08-17: qty 1

## 2013-08-17 MED ORDER — WARFARIN SODIUM 5 MG PO TABS
5.0000 mg | ORAL_TABLET | Freq: Every day | ORAL | Status: AC
  Administered 2013-08-17: 5 mg via ORAL
  Filled 2013-08-17: qty 1

## 2013-08-17 MED ORDER — SODIUM CHLORIDE 0.9 % IJ SOLN: 3.0000 mL | INTRAMUSCULAR | Status: DC | PRN

## 2013-08-17 MED ORDER — MOVING RIGHT ALONG BOOK
Freq: Once | Status: AC
  Administered 2013-08-19: 12:00:00
  Filled 2013-08-17 (×2): qty 1

## 2013-08-17 MED ORDER — WARFARIN SODIUM 5 MG PO TABS: 5.0000 mg | ORAL_TABLET | Freq: Every day | ORAL | Status: DC

## 2013-08-17 MED ORDER — PROMETHAZINE HCL 25 MG/ML IJ SOLN
12.5000 mg | Freq: Four times a day (QID) | INTRAMUSCULAR | Status: DC | PRN
  Administered 2013-08-17: 12.5 mg via INTRAVENOUS
  Filled 2013-08-17: qty 1

## 2013-08-17 MED ORDER — FUROSEMIDE 10 MG/ML IJ SOLN
20.0000 mg | Freq: Four times a day (QID) | INTRAMUSCULAR | Status: AC
  Administered 2013-08-17 (×2): 20 mg via INTRAVENOUS
  Filled 2013-08-17: qty 2

## 2013-08-17 MED ORDER — FUROSEMIDE 10 MG/ML IJ SOLN
INTRAMUSCULAR | Status: AC
  Administered 2013-08-17: 20 mg via INTRAVENOUS
  Filled 2013-08-17: qty 4

## 2013-08-17 MED ORDER — SODIUM CHLORIDE 0.9 % IV SOLN: 250.0000 mL | INTRAVENOUS | Status: DC | PRN

## 2013-08-17 MED ORDER — SODIUM CHLORIDE 0.9 % IJ SOLN
3.0000 mL | Freq: Two times a day (BID) | INTRAMUSCULAR | Status: DC
  Administered 2013-08-17 – 2013-08-20 (×8): 3 mL via INTRAVENOUS

## 2013-08-17 MED ORDER — METOCLOPRAMIDE HCL 5 MG/ML IJ SOLN
10.0000 mg | Freq: Three times a day (TID) | INTRAMUSCULAR | Status: AC
  Administered 2013-08-17 – 2013-08-18 (×2): 10 mg via INTRAVENOUS
  Filled 2013-08-17 (×5): qty 2

## 2013-08-17 MED ORDER — DILTIAZEM HCL ER COATED BEADS 240 MG PO CP24
240.0000 mg | ORAL_CAPSULE | Freq: Every evening | ORAL | Status: DC
  Administered 2013-08-17: 240 mg via ORAL
  Filled 2013-08-17 (×2): qty 1

## 2013-08-17 MED ORDER — BISACODYL 10 MG RE SUPP: 10.0000 mg | Freq: Every day | RECTAL | Status: DC | PRN

## 2013-08-17 MED ORDER — ASPIRIN EC 81 MG PO TBEC
81.0000 mg | DELAYED_RELEASE_TABLET | Freq: Every day | ORAL | Status: DC
  Administered 2013-08-17 – 2013-08-21 (×5): 81 mg via ORAL
  Filled 2013-08-17 (×5): qty 1

## 2013-08-17 NOTE — Progress Notes (Signed)
Patient has not voided since Foley removal earlier today.  Bladder scan showed .  Performed I&O cath and removed approx .  Will follow protocol and re-check in 6 hrs if patient has not voided.  He has been very tired and is only arousable since having received Phenergan 12.5mg  IV at 1915.  Will continue to monitor.  [name]

## 2013-08-17 NOTE — Progress Notes (Signed)
Pt experienced nausea and vomiting during transfer to 2W24. Pt vomited approx 120 mL,  jello from lunch and coffee ground. Receiving nurse aware. Pt nausea improved, sitting comfortable in chair with receiving RN present.

## 2013-08-17 NOTE — Progress Notes (Signed)
      [address]       [name] [address]             [phone]        CARDIOTHORACIC SURGERY PROGRESS NOTE   R2 Days Post-Op Procedure(s) (LRB): AORTIC VALVE REPLACEMENT (AVR) (N/A) REDO MITRAL VALVE (MV) REPLACEMENT (N/A) INTRAOPERATIVE TRANSESOPHAGEAL ECHOCARDIOGRAM (N/A)  Subjective: Looks and feels better.  Some nausea, especially with movement but overall doing well.  Objective: Vital signs: BP Readings from Last 1 Encounters:  08/17/13 106/55   Pulse Readings from Last 1 Encounters:  08/17/13 44   Resp Readings from Last 1 Encounters:  08/17/13 12   Temp Readings from Last 1 Encounters:  08/17/13 97.8 F (36.6 C) Oral    Hemodynamics: PAP: (42-55)/(16-26) 52/22 mmHg CO:  [5.6 L/min-6.3 L/min] 5.6 L/min CI:  [2.9 L/min/m2-3.3 L/min/m2] 2.9 L/min/m2  Physical Exam:  Rhythm:   Afib w/ controlled rate  Breath sounds: Clear, symmetrical  Heart sounds:  irreg  Incisions:  Dressing dry, intact  Abdomen:  Soft, non-distended, non-tender  Extremities:  Warm, well-perfused  Chest tubes:  No air leak    Intake/Output from previous day: 11/05 0701 - 11/06 0700 In: 1438.4 [P.O.:840; I.V.:448.4; IV Piggyback:150] Out: 9147 [WGNFA:2130; Chest Tube:450] Intake/Output this shift:    Lab Results:  CBC: Recent Labs  08/16/13 1637 08/17/13 0405  WBC 13.1* 15.5*  HGB 9.5* 9.7*  HCT 28.8* 30.1*  PLT 73* 74*    BMET:  Recent Labs  08/16/13 0348 08/16/13 1558 08/16/13 1637 08/17/13 0405  NA 140 137  --  140  K 4.3 4.7  --  4.9  CL 108 109  --  106  CO2 22  --   --  24  GLUCOSE 145* 150*  --  126*  BUN 15 20  --  25*  CREATININE 1.05 1.30 1.22 1.42*  CALCIUM 8.2*  --   --  8.9     CBG (last 3)   Recent Labs  08/16/13 1959 08/16/13 2255 08/17/13 0350  GLUCAP 131* 144* 123*    ABG    Component Value Date/Time   PHART 7.342* 08/16/2013 0340   PCO2ART 41.6 08/16/2013 0340   PO2ART 74.0* 08/16/2013 0340   HCO3 22.4  08/16/2013 0340   TCO2 24 08/16/2013 1558   ACIDBASEDEF 3.0* 08/16/2013 0340   O2SAT 93.0 08/16/2013 0340    CXR: Improved with mild bibasilar atelectasis, RLL atelectasis is dramatically better  Assessment/Plan: S/P Procedure(s) (LRB): AORTIC VALVE REPLACEMENT (AVR) (N/A) REDO MITRAL VALVE (MV) REPLACEMENT (N/A) INTRAOPERATIVE TRANSESOPHAGEAL ECHOCARDIOGRAM (N/A)  Overall stable POD2 Rate-controlled Afib w/ stable BP off drips, chronic persistent Afib pre-op Expected post op acute blood loss anemia, mild, stable Expected post op atelectasis, improved Expected post op volume excess, mild, diuresing well Post op acute renal insufficiency, likely ATN, mild w/ good UOP Post op thrombocytopenia, stable Post op nausea - he's had this in the past as well   Mobilize  Diuresis  Watch renal function  Leave pleural tubes in for now  Coumadin  Transfer step down    Jason [name] 08/17/2013 7:54 AM

## 2013-08-17 NOTE — Progress Notes (Signed)
Pt experiencing continued nausea with activity with small emesis 60 ml even with Zofran. [place] PA paged and informed will start pt on reglan.

## 2013-08-18 ENCOUNTER — Inpatient Hospital Stay (HOSPITAL_COMMUNITY): Payer: Medicare Other

## 2013-08-18 LAB — CBC
HCT: 27.8 % — ABNORMAL LOW (ref 39.0–52.0)
Hemoglobin: 9 g/dL — ABNORMAL LOW (ref 13.0–17.0)
MCH: 30.8 pg (ref 26.0–34.0)
MCHC: 32.4 g/dL (ref 30.0–36.0)
MCV: 95.2 fL (ref 78.0–100.0)
Platelets: 77 10*3/uL — ABNORMAL LOW (ref 150–400)
RBC: 2.92 MIL/uL — ABNORMAL LOW (ref 4.22–5.81)
RDW: 14.5 % (ref 11.5–15.5)
WBC: 11.3 10*3/uL — ABNORMAL HIGH (ref 4.0–10.5)

## 2013-08-18 LAB — PROTIME-INR
INR: 1.25 (ref 0.00–1.49)
Prothrombin Time: 15.4 seconds — ABNORMAL HIGH (ref 11.6–15.2)

## 2013-08-18 LAB — BASIC METABOLIC PANEL
BUN: 32 mg/dL — ABNORMAL HIGH (ref 6–23)
CO2: 27 mEq/L (ref 19–32)
Calcium: 9.3 mg/dL (ref 8.4–10.5)
Chloride: 106 mEq/L (ref 96–112)
Creatinine, Ser: 1.46 mg/dL — ABNORMAL HIGH (ref 0.50–1.35)
GFR calc Af Amer: 51 mL/min — ABNORMAL LOW (ref >90)
GFR calc non Af Amer: 44 mL/min — ABNORMAL LOW (ref >90)
Glucose, Bld: 111 mg/dL — ABNORMAL HIGH (ref 70–99)
Potassium: 4.8 mEq/L (ref 3.5–5.1)
Sodium: 140 mEq/L (ref 135–145)

## 2013-08-18 LAB — GLUCOSE, CAPILLARY
Glucose-Capillary: 111 mg/dL — ABNORMAL HIGH (ref 70–99)
Glucose-Capillary: 104 mg/dL — ABNORMAL HIGH (ref 70–99)

## 2013-08-18 MED ORDER — TAMSULOSIN HCL 0.4 MG PO CAPS
0.4000 mg | ORAL_CAPSULE | Freq: Every day | ORAL | Status: DC
  Administered 2013-08-18 – 2013-08-21 (×4): 0.4 mg via ORAL
  Filled 2013-08-18 (×5): qty 1

## 2013-08-18 NOTE — Progress Notes (Addendum)
   3 Days Post-Op Procedure(s) (LRB): AORTIC VALVE REPLACEMENT (AVR) (N/A) REDO MITRAL VALVE (MV) REPLACEMENT (N/A) INTRAOPERATIVE TRANSESOPHAGEAL ECHOCARDIOGRAM (N/A)  Subjective:  Jason [name] states he is feels okay this morning.  He has yet to void since foley removal.  No BM  Objective: Vital signs in last 24 hours: Temp:  [97.6 F (36.4 C)-98.6 F (37 C)] 98 F (36.7 C) (11/07 0501) Pulse Rate:  [47-138] 74 (11/07 0501) Cardiac Rhythm:  [-] Atrial fibrillation (11/06 1945) Resp:  [11-19] 12 (11/07 0501) BP: (95-121)/(58-86) 117/74 mmHg (11/07 0501) SpO2:  [96 %-100 %] 98 % (11/07 0501) Weight:  [175 lb 0.7 oz (79.4 kg)] 175 lb 0.7 oz (79.4 kg) (11/07 0501)  Intake/Output from previous day: 11/06 0701 - 11/07 0700 In: 460 [P.O.:420; I.V.:40] Out: 1485 [Urine:1095; Emesis/NG output:120; Stool:60; Chest Tube:210]  General appearance: alert, cooperative and no distress Heart: irregularly irregular rhythm Lungs: clear to auscultation bilaterally Abdomen: soft, non-tender; bowel sounds normal; no masses,  no organomegaly Extremities: edema trace Wound: clean and dry  Lab Results:  Recent Labs  08/17/13 0405 08/18/13 0442  WBC 15.5* 11.3*  HGB 9.7* 9.0*  HCT 30.1* 27.8*  PLT 74* 77*   BMET:  Recent Labs  08/17/13 0405 08/18/13 0442  NA 140 140  K 4.9 4.8  CL 106 106  CO2 24 27  GLUCOSE 126* 111*  BUN 25* 32*  CREATININE 1.42* 1.46*  CALCIUM 8.9 9.3    PT/INR:  Recent Labs  08/18/13 0442  LABPROT 15.4*  INR 1.25   ABG    Component Value Date/Time   PHART 7.342* 08/16/2013 0340   HCO3 22.4 08/16/2013 0340   TCO2 24 08/16/2013 1558   ACIDBASEDEF 3.0* 08/16/2013 0340   O2SAT 93.0 08/16/2013 0340   CBG (last 3)   Recent Labs  08/17/13 1620 08/17/13 2134 08/18/13 0631  GLUCAP 119* 121* 111*    Assessment/Plan: S/P Procedure(s) (LRB): AORTIC VALVE REPLACEMENT (AVR) (N/A) REDO MITRAL VALVE (MV) REPLACEMENT (N/A) INTRAOPERATIVE TRANSESOPHAGEAL  ECHOCARDIOGRAM (N/A)  1. CV- Atrial Fibrillation, remains paced- on Cardizem, Lopressor 2. INR 1.25- will repeat 5 mg tonight 3. Pulm- wean oxygen as tolerated, atelectasis on CXR continue IS 4. GU- Urinary rentention since foley removal- will start Flomax, would like to avoid replacing foley 5. Renal- creatinine mildly elevated at 1.46, volume status remains stable continue diuresis for now 6. Chest tube- 130 cc output, will remove today 7. Dispo- patient stable, will progress slowly continue current care   LOS: 3 days    Jason [name], Jason [name] 08/18/2013  I have seen and examined the patient and agree with the assessment and plan as outlined.  D/C pacing wires and chest tubes.  Mobilize.  Diuresis.  Hold ACE-I for now although renal function appears stable.    Jason [name],Jason [name] 08/18/2013 8:28 AM

## 2013-08-18 NOTE — Progress Notes (Signed)
CARDIAC REHAB PHASE I   PRE:  Rate/Rhythm: 95 afib  BP:  Supine: 120/70  Sitting:   Standing:    SaO2: 96%3L, 87%RA  MODE:  Ambulation: 300 ft   POST:  Rate/Rhythm: 107 afib  BP:  Supine:   Sitting: 130/60  Standing:    SaO2: 92%3L 1020-1105 Pt stood to void 150 ml and then walked 300 ft on 3L with rolling walker and asst x 2. Difficulty keeping walker in middle of hall. Stated he can only see out of right eye. Tolerated well, just tired by end of walk. To recliner with call bell. Wife in room. Wife was asking about rehab after d/c. Told her PT to address this with her and we will discuss CRP2. Desat quickly when taken off of oxygen. Encouraged 2 more walks today and IS every hour.   Luetta Nutting, RN BSN  08/18/2013 10:59 AM   95Afib

## 2013-08-18 NOTE — Progress Notes (Signed)
Paged Jason [name] about [name]'s heart rate she said " set his pacer to back up at 60 v paced"

## 2013-08-18 NOTE — Discharge Summary (Signed)
Physician Discharge Summary  Patient ID: Jason [name] MRN: [id number] DOB/AGE: 1932-07-31 79 y.o.  Admit date: 08/15/2013 Discharge date: 08/21/2013  Admission Diagnoses:  Patient Active Problem List   Diagnosis Date Noted  . Atrial fibrillation 07/27/2013  . Atrial fibrillation, persistent   . Tricuspid regurgitation   . Severe aortic stenosis 05/29/2013  . Femoral hernia 09/07/2011  . Groin mass 07/27/2011  . Mitral regurgitation 02/25/2011   Discharge Diagnoses:   Patient Active Problem List   Diagnosis Date Noted  . S/P redo mitral valve replacement and aortic valve replacement with bioprosthetic valves 08/15/2013  . S/P aortic valve replacement with bioprosthetic valve 08/15/2013  . Atrial fibrillation 07/27/2013  . Atrial fibrillation, persistent   . Tricuspid regurgitation   . Severe aortic stenosis 05/29/2013  . Femoral hernia 09/07/2011  . Groin mass 07/27/2011  . Mitral regurgitation 02/25/2011   Discharged Condition: good  History of Present Illness:   Jason [name] is a 79 year old retired white male who underwent minimally invasive mitral valve repair in 2005 by Dr. Silvestre Mesi at [facility]. The patient was reportedly left with some residual mitral regurgitation but has done very well clinically until recently. He developed atrial fibrillation several years ago and has been treated with pharmacologic rate control and anticoagulation with Coumadin.  The patient was recently seen in follow up by Dr. Eldridge Dace at which time the patient complained of some chest tightness and exertional shortness of breath.  He was started on diuretic which did provide some relief of his dyspnea.  He underwent transthoracic echocardiogram on 05/29/2013 that revealed significant progression of aortic stenosis with peak velocity across the aortic valve measured greater than 4 m/s corresponding to a mean transvalvular gradient of 40 mm mercury. Left ventricular systolic function  appear preserved with ejection fraction estimated 50-55%. There remained "mild to moderate eccentric mitral valve regurgitation" and moderate tricuspid regurgitation with moderately elevated right ventricular systolic pressure.  Due to patients symptoms and disease progression the patient was referred to [facility] for possible surgical intervention.  He was initially evaluated by Dr. Cornelius Moras on 06/26/2013 at which time it was felt he would require intervention on his Aortic Valve.  There was also concern regarding his Mitral Valve Regurgitation with the presence of Pulmonary Hypertension.  It was felt the patient would benefit from transesophageal echocardiogram and cardiac catheterization to further assess Mitral Valve prior to proceeding with surgery.  Transesophageal echocardiogram was attempted by later aborted because of difficulty passing the scope into the esophagus. Cardiac catheterization confirmed the presence of severe aortic stenosis and was notable for the absence of significant coronary artery disease. Pulmonary artery pressures were elevated and associated with significant V waves on wedge tracing, suggestive of the presence of severe mitral regurgitation.  He again presented to Dr. Cornelius Moras for follow up on 07/24/2013 at which time it was felt the patient should undergo Barium swallow to assess for esophageal stricture which would post further difficulty intra/post operatively for further assessment with transesophageal echocardiogram.  Patient underwent an upper GI barium swallow which was entirely normal with the exception of a very small sliding type hiatal hernia. The patient underwent another attempt at transesophageal echocardiogram by Dr. Eldridge Dace. However, again Dr. Eldridge Dace experienced difficulty with attempts to intubate the esophagus using the TEE probe. At that time the patient was seen in consultation by Dr. Dulce Sellar and upper GI endoscopy was performed. This exam was completely normal and the  endoscopy probe passed into the esophagus without any difficulty.  It was suspected that problems passing the TEE probe may have been related to angulation in the neck associated with the patient's cervical spine and arthritis.  The patient was again seen by Dr. Cornelius Moras on 08/01/2013 at which time it was decided to proceed with Aortic Valve Replacement with possible Mitral or Tricuspid Valve intervention.  The risks and benefits of the procedure were explained to the patient and he was agreeable to proceed.     Hospital Course:   Jason [name] presented to [facility] on 08/15/2013.  He was taken to the operating room and underwent Aortic Valve Replacement utilizing a 23 mm Edwards Magna Ease Aortic Pericardial Tissue Valve and Redo Mitral Valve Replacement utilizing a 27 mm Edwards Magna Mitral Pericardial Valve.  The patient tolerated the procedure well and was taken to the SICU in stable condition.  The patient was extubated the morning after surgery.  During his stay in the ICU the patient was weaned off all cardiac drips.  His remained in Atrial Fibrillation with rate control in the 100s.  His arterial lines were removed without difficulty.  The patients foley catheter was removed.  However he developed urinary retention.  He was medically stable and transferred to the step down unit.  Patient continued to have issues with urinary retention.  He required in and out straight catheterization and was placed on Flomax. He did not require foley reinsertion as he was able to void on his own. His chest tubes were removed without difficulty.  He has been restarted on his home regimen of Coumadin for Atrial Fibrillation stroke prophylaxis.  The patient requires a lot of motivation to get up and ambulate.  Physical therapy consult was placed. He was restarted on Cardizem. He was then placed on Lopressor. He had bradycardia and both of these have been stopped. He remained a fib with HR in the 80-90's. His epicardial  pacing wires were then removed on 08/20/2013. He is still on 2 liters of oxygen via [place]. Hopefully, he can be weaned to room air. Provided he remains afebrile, hemodynamically stable, and pending morning round evaluation, he will be surgically stable for discharge on 08/21/2013.  Significant Diagnostic Studies:  Cardiac Catheterization  HEMODYNAMICS: Aortic pressure was 94/60; LV pressure was 146/6; LVEDP 15. There was a gradient between the left ventricle and aorta. Right atrial pressure 14 or 15, mean right atrial pressure 13 mm Hg. RV pressure seventy over three, RVEDP 10 mm Hg. PA pressure 63/24, mean PA pressure 41 mm Hg. Pulmonary capillary wedge pressure 26/46, mean capillary wedge pressure of 29 mm Hg. PA saturation 59%. Aortic saturation 89%. Cardiac output 4.78 L per minute. Cardiac index by Fick 2.53. Calculated aortic valve area using the Gorlin formula 0.77 cm. Large V waves noted on the wedge tracing.  ANGIOGRAPHIC DATA: The left main coronary artery is widely patent.  The left anterior descending artery is a large vessel which wrapped around the apex. There are 2 medium-sized diagonals. There is mild atherosclerosis in the proximal area of the first diagonal. The second diagonal is patent.  The left circumflex artery is a large vessel. The OM1 is small but patent. The OM 2 is medium sized and patent. The OM 3 is a large vessel and widely patent.  The right coronary artery is a large dominant vessel. There is a 20% calcified lesion in the proximal vessel. The bifurcation of the posterior lateral artery and posterior descending artery happens in the mid RCA. The posterior lateral  artery is large and widely patent. The posterior descending artery is also large and widely patent.  LEFT VENTRICULOGRAM: Left ventricular angiogram was done in the 30 RAO projection and revealed normal left ventricular wall motion and systolic function with an estimated ejection fraction of 60 %. LVEDP was 15 mmHg.  At least 2+ MR.   Treatments: surgery:   Redo Mitral Valve Replacement  Edwards Magna Mitral Bovine Bioprosthetic Tissue Valve (size 27mm, model #7300TFX, serial #[id number])  Aortic Valve Replacement  Edwards Magna Ease Bovine Bioprosthetic Tissue Valve (size 23mm, model #3300TFX, serial #[id number]  Disposition: 01-Home or Self Care  Discharge Medications:     Medication List    STOP taking these medications       amiodarone 200 MG tablet  Commonly known as:  PACERONE      TAKE these medications       acetaminophen 650 MG CR tablet  Commonly known as:  TYLENOL  Take 1,300 mg by mouth 2 (two) times daily.     aspirin 81 MG EC tablet  Take 1 tablet (81 mg total) by mouth daily.     atorvastatin 20 MG tablet  Commonly known as:  LIPITOR  Take 20 mg by mouth every evening.     b complex vitamins tablet  Take 1 tablet by mouth daily.     CARTIA XT 240 MG 24 hr capsule  Generic drug:  diltiazem  Take 240 mg by mouth every evening.     furosemide 40 MG tablet  Commonly known as:  LASIX  Take 1 tablet (40 mg total) by mouth daily.     glucosamine-chondroitin 500-400 MG tablet  Take 1 tablet by mouth 2 (two) times daily.     lisinopril 20 MG tablet  Commonly known as:  PRINIVIL,ZESTRIL  Take 10 mg by mouth daily.     multivitamin capsule  Take 1 capsule by mouth daily.     oxyCODONE 5 MG immediate release tablet  Commonly known as:  Oxy IR/ROXICODONE  Take 1-2 tablets (5-10 mg total) by mouth every 3 (three) hours as needed for moderate pain.     Potassium Chloride ER 20 MEQ Tbcr  Take 10 mEq by mouth daily.     PRESERVISION AREDS PO  Take by mouth 2 (two) times daily.     tamsulosin 0.4 MG Caps capsule  Commonly known as:  FLOMAX  Take 1 capsule (0.4 mg total) by mouth daily.     vitamin B-12 1000 MCG tablet  Commonly known as:  CYANOCOBALAMIN  Take 1,000 mcg by mouth daily.     warfarin 5 MG tablet  Commonly known as:  COUMADIN  Take 5-7.5 mg by mouth  See admin instructions. 5mg  on Monday and Friday; 7.5mg  Sunday, Tuesday, Wednesday, Thursday, Saturday         The patient has been discharged on:   1.Beta Blocker:  Yes [  ]                              No   [x   ]                              If No, reason: Bradycardia  2.Ace Inhibitor/ARB: Yes [ x  ]  No  [    ]                                     If No, reason:  3.Statin:   Yes [ x  ]                  No  [   ]                  If No, reason:  4.Ecasa:  Yes  [ x  ]                  No   [   ]                  If No, reason:       Future Appointments Provider Department Dept Phone   08/24/2013 9:45 AM [facility] [place] Office [phone]   09/11/2013 2:00 PM [facility] [place] [phone]   11/09/2013 10:30 AM Everette Rank, MD [facility] [phone]     Follow-up Information   Follow up with [facility] [place] On 09/11/2013. (Appointment is at 2:00)    Contact information:   [address] [place] [address]       Follow up with [facility] On 09/11/2013. (Please get CXR at 1:00)    Contact information:   [place]       Schedule an appointment as soon as possible for a visit with Corky Crafts., MD. (2-4 weeks)    Specialty:  Cardiology   Contact information:   [address] [place] [address] [phone]       Follow up with PT/INR. (Please keep appoitnment scheduled for 08/24/2013)       Signed: Ardelle Balls PA-C 08/20/2013, 8:47 AM

## 2013-08-18 NOTE — Progress Notes (Signed)
CSW received consult for possible SNF. CSW awaiting pt/ot evaluation before assessing patient. CSW will be available as needed, pending PT evaluation.  Maree Krabbe, MSW, Theresia Majors [phone]

## 2013-08-18 NOTE — Progress Notes (Signed)
Patient has still not spontaneously voided.  Re-scanned bladder and volume was .  According to protocol, since <410ml and pt not uncomfortable, we will wait 2 more hours and re-scan if no void.  Will continue to monitor.  [name]

## 2013-08-18 NOTE — Care Management Note (Signed)
    Page 1 of 2   08/21/2013     4:52:53 PM   CARE MANAGEMENT NOTE 08/21/2013  Patient:  Jason [name], Jason [name]   Account Number:  [id number]  Date Initiated:  08/16/2013  Documentation initiated by:  [facility]  Subjective/Objective Assessment:   post op Redo MVR and now AVR.     Action/Plan:   Anticipated DC Date:  08/21/2013   Anticipated DC Plan:  HOME W HOME HEALTH SERVICES      DC Planning Services  CM consult      [facility] Choice  HOME HEALTH   Choice offered to / List presented to:  C-1 Patient   DME arranged  Levan Hurst      DME agency  [facility]     [facility] arranged  HH-1 RN  HH-4 NURSE'S AIDE      HH agency  [facility]   Status of service:  Completed, signed off Medicare Important Message given?   (If response is "NO", the following Medicare IM given date fields will be blank) Date Medicare IM given:   Date Additional Medicare IM given:    Discharge Disposition:  HOME W HOME HEALTH SERVICES  Per UR Regulation:  Reviewed for med. necessity/level of care/duration of stay  If discussed at Long Length of Stay Meetings, dates discussed:    Comments:  08/21/13 [name],RN,BSN [phone] PT FOR DC HOME TODAY.  MET WITH PT AND WIFE TO DISCUSS HOME ARRANGEMENTS.  REFERRAL TO [facility], PER PT CHOICE FOR HH FOLLLOW UP.  START OF CARE 24-48H POST DC DATE.  RW DELIVERED TO ROOM PRIOR TO DC.  08/19/13 07:26 PT does not recc. HHPT. Requested MD to order RW per PT recc.  Rolling walker to be delivered to room prior to discharge.  No other CM needs communicated.  [name], BSN, CM [phone].  Contact:  [name],[name] Spouse [phone]   [phone]  08/18/13 [name],RN,BSN [phone] PT SLOW TO PROGRESS.  P.T. CONSULT PENDING TO DETERMINE HOME NEEDS.  WILL FOLLOW.  08-16-13 10:15am [name], RNBSN [phone] patient independent prior to surgery.  Has wife.

## 2013-08-19 ENCOUNTER — Inpatient Hospital Stay (HOSPITAL_COMMUNITY): Payer: Medicare Other

## 2013-08-19 LAB — TYPE AND SCREEN
ABO/RH(D): B NEG
Antibody Screen: NEGATIVE
Unit division: 0
Unit division: 0
Unit division: 0
Unit division: 0

## 2013-08-19 LAB — PROTIME-INR
INR: 1.49 (ref 0.00–1.49)
Prothrombin Time: 17.6 seconds — ABNORMAL HIGH (ref 11.6–15.2)

## 2013-08-19 MED ORDER — LACTULOSE 10 GM/15ML PO SOLN
20.0000 g | Freq: Once | ORAL | Status: AC
  Administered 2013-08-19: 20 g via ORAL
  Filled 2013-08-19: qty 30

## 2013-08-19 MED ORDER — WARFARIN SODIUM 5 MG PO TABS
5.0000 mg | ORAL_TABLET | Freq: Every day | ORAL | Status: DC
  Administered 2013-08-19: 5 mg via ORAL
  Filled 2013-08-19 (×2): qty 1

## 2013-08-19 NOTE — Progress Notes (Addendum)
      [address]       [place] [address]             [phone]        4 Days Post-Op Procedure(s) (LRB): AORTIC VALVE REPLACEMENT (AVR) (N/A) REDO MITRAL VALVE (MV) REPLACEMENT (N/A) INTRAOPERATIVE TRANSESOPHAGEAL ECHOCARDIOGRAM (N/A)  Subjective: Patient just finished walking with [facility]. No bowel movement yet.  Objective: Vital signs in last 24 hours: Temp:  [97.5 F (36.4 C)-98.9 F (37.2 C)] 97.5 F (36.4 C) (11/08 0455) Pulse Rate:  [59-84] 84 (11/08 0455) Cardiac Rhythm:  [-] Atrial fibrillation (11/07 2015) Resp:  [16-18] 18 (11/08 0455) BP: (87-138)/(47-74) 138/74 mmHg (11/08 0455) SpO2:  [92 %-100 %] 94 % (11/08 0455) Weight:  [79.9 kg (176 lb 2.4 oz)] 79.9 kg (176 lb 2.4 oz) (11/08 0455)  Pre op weight  78 kg Current Weight  08/19/13 79.9 kg (176 lb 2.4 oz)      Intake/Output from previous day: 11/07 0701 - 11/08 0700 In: 720 [P.O.:720] Out: 975 [Urine:775; Chest Tube:200]   Physical Exam:  Cardiovascular: IRRR IRRR, no murmur Pulmonary: Slightly diminished breath sounds at bases; no rales, wheezes, or rhonchi. Abdomen: Soft, non tender, bowel sounds present. Extremities: Trace lower extremity edema. Wound: Clean and dry.  No erythema or signs of infection.  Lab Results: CBC: Recent Labs  08/17/13 0405 08/18/13 0442  WBC 15.5* 11.3*  HGB 9.7* 9.0*  HCT 30.1* 27.8*  PLT 74* 77*   BMET:  Recent Labs  08/17/13 0405 08/18/13 0442  NA 140 140  K 4.9 4.8  CL 106 106  CO2 24 27  GLUCOSE 126* 111*  BUN 25* 32*  CREATININE 1.42* 1.46*  CALCIUM 8.9 9.3    PT/INR:  Lab Results  Component Value Date   INR 1.49 08/19/2013   INR 1.25 08/18/2013   INR 1.30 08/17/2013   ABG:  INR: Will add last result for INR, ABG once components are confirmed Will add last 4 CBG results once components are confirmed  Assessment/Plan:  1. CV - A fib. Lopressor and Cardizem stopped yesterday secondary to bradycardia. On  Coumadin. INR increased from 1.25 to 1.49. Continue with Coumadin 5 daily. EPW to remain for today. 2.  Pulmonary - Chest tube output 130 cc last 12 hours. Will remove. On 2 liters of oxygen via [place]-wean as tolerates.Encourage incentive spirometer 3.  Acute blood loss anemia - Last H and H 9 and 27.8 5. Urinary retention resolved - started on Flomax yesterday. Able to void on own. 6. Thrombocytopenia - platelets 77,000 7. Continue with CRPI 8. LOC constipation  ZIMMERMAN,DONIELLE MPA-C 08/19/2013,8:20 AM

## 2013-08-19 NOTE — Evaluation (Signed)
Physical Therapy Evaluation Patient Details Name: Jason [name] MRN: [id number] DOB: Jan 28, 1932 Today's Date: 08/19/2013 Time: 1415-1440 PT Time Calculation (min): 25 min  PT Assessment / Plan / Recommendation History of Present Illness  s/p redo mitral valve replacement and aortic valve replacement with bioprosthetic valves  Clinical Impression  Pt admitted with above. Pt currently with functional limitations due to the deficits listed below (see PT Problem List). Patient has concerns about wife being able to care for him at home, but wife reports that she is willing and able. Pt will benefit from skilled PT to increase their independence and safety with mobility to allow discharge to home with family.  Patient was able to complete a lap without RW, but may need one at home initially after d/c for safety concerns.  Sternal precautions reviewed with patient, and nurse provided educational materials at the end of session.     PT Assessment  Patient needs continued PT services    Follow Up Recommendations  No PT follow up          Equipment Recommendations  Rolling walker with 5" wheels (TBD if necessary for safety)    Recommendations for Other Services     Frequency Min 3X/week    Precautions / Restrictions Precautions Precautions: Sternal Precaution Comments: inconsistently follows Restrictions Weight Bearing Restrictions: No   Pertinent Vitals/Pain No pain for the last 2 days      Mobility  Bed Mobility Bed Mobility: Rolling Left;Left Sidelying to Sit Rolling Left: 5: Supervision Left Sidelying to Sit: 5: Supervision Details for Bed Mobility Assistance: non-compliant with sternal precautions for avoiding pulling with bed mobility Transfers Transfers: Stand to Sit Stand to Sit: 5: Supervision Details for Transfer Assistance: non compliant with sternal precautions for avoiding pushing with hands Ambulation/Gait Ambulation/Gait Assistance: 4: Min guard Ambulation  Distance (Feet): 300 Feet Assistive device: Rolling walker Ambulation/Gait Assistance Details: 2 L O2. Used RW for first lap, then no AD for 2nd lap Gait Pattern: Step-through pattern    Exercises     PT Diagnosis: Difficulty walking;Generalized weakness  PT Problem List: Decreased strength;Decreased activity tolerance PT Treatment Interventions: Gait training;Therapeutic activities;Therapeutic exercise;Stair training     PT Goals(Current goals can be found in the care plan section) Acute Rehab PT Goals Patient Stated Goal: get home and return to activities PT Goal Formulation: With patient/family Time For Goal Achievement: 09/02/13 Potential to Achieve Goals: Good  Visit Information  Last PT Received On: 08/19/13 Assistance Needed: +1 History of Present Illness: s/p redo mitral valve replacement and aortic valve replacement with bioprosthetic valves       Prior Functioning  Home Living Family/patient expects to be discharged to:: Private residence Living Arrangements: Spouse/significant other Available Help at Discharge: Family Type of Home: House Home Access: Stairs to enter Secretary/administrator of Steps: 1 Entrance Stairs-Rails: None Home Layout: One level Home Equipment: None Prior Function Level of Independence: Independent Communication Communication: No difficulties    Cognition  Cognition Arousal/Alertness: Awake/alert Behavior During Therapy: WFL for tasks assessed/performed Overall Cognitive Status: Within Functional Limits for tasks assessed    Extremity/Trunk Assessment Lower Extremity Assessment Lower Extremity Assessment: Overall WFL for tasks assessed   Balance    End of Session PT - End of Session Equipment Utilized During Treatment: Gait belt Activity Tolerance: Patient tolerated treatment well;Patient limited by fatigue Patient left: in bed;with call bell/phone within reach Nurse Communication: Mobility status;Precautions  GP    Barrie Dunker, SPT Pager:  [phone]   Hibah Odonnell  08/19/2013, 3:13 PM  Jake Shark, PT DPT [phone]

## 2013-08-19 NOTE — Plan of Care (Signed)
Problem: Phase III Progression Outcomes Goal: Other Phase III Outcomes/Goals Outcome: Completed/Met Date Met:  08/19/13 Watched DC video #[id number]

## 2013-08-19 NOTE — Progress Notes (Signed)
CARDIAC REHAB PHASE I   PRE:  Rate/Rhythm: 78 atrial fib with occasional paced beat  BP:  Supine: 113/67  Sitting:   Standing:    SaO2: 99%2.5L  MODE:  Ambulation: 550 ft   POST:  Rate/Rhythm: 94 atrial fib  BP:  Supine:   Sitting: 129/67  Standing:    SaO2: 93%2L 0807-0830 Pt motivated today to go farther. Walked 550 ft on 2L with rolling walker and asst x 2 with steady gait. No difficulty steering walker today. Sats at 93% 2L in hallway. To recliner after walk. Encouraged more walks and IS. Set up breakfast. Can be asst x1 next walk. Left on 2L.   Luetta Nutting, RN BSN  08/19/2013 8:27 AM

## 2013-08-19 NOTE — Progress Notes (Signed)
Discontinued CT on Rt side. Patient tolerated well, minimal bleeding at site. VSS 139/76 BP, 90HR. Both sites covered with Vaseline gauze and guaze with pressure dressing after sutures were tied. Will continue to monitor closely. Lajuana Matte, RN

## 2013-08-20 LAB — CBC
HCT: 25.2 % — ABNORMAL LOW (ref 39.0–52.0)
Hemoglobin: 8.2 g/dL — ABNORMAL LOW (ref 13.0–17.0)
MCH: 30.1 pg (ref 26.0–34.0)
MCHC: 32.5 g/dL (ref 30.0–36.0)
MCV: 92.6 fL (ref 78.0–100.0)
Platelets: 128 10*3/uL — ABNORMAL LOW (ref 150–400)
RBC: 2.72 MIL/uL — ABNORMAL LOW (ref 4.22–5.81)
RDW: 13.9 % (ref 11.5–15.5)
WBC: 7 10*3/uL (ref 4.0–10.5)

## 2013-08-20 LAB — BASIC METABOLIC PANEL
BUN: 16 mg/dL (ref 6–23)
CO2: 29 mEq/L (ref 19–32)
Calcium: 9.1 mg/dL (ref 8.4–10.5)
Chloride: 104 mEq/L (ref 96–112)
Creatinine, Ser: 0.86 mg/dL (ref 0.50–1.35)
GFR calc Af Amer: >90 mL/min (ref >90)
GFR calc non Af Amer: 81 mL/min — ABNORMAL LOW (ref >90)
Glucose, Bld: 120 mg/dL — ABNORMAL HIGH (ref 70–99)
Potassium: 4.2 mEq/L (ref 3.5–5.1)
Sodium: 139 mEq/L (ref 135–145)

## 2013-08-20 LAB — PROTIME-INR
INR: 1.82 — ABNORMAL HIGH (ref 0.00–1.49)
Prothrombin Time: 20.5 seconds — ABNORMAL HIGH (ref 11.6–15.2)

## 2013-08-20 MED ORDER — POLYETHYLENE GLYCOL 3350 17 G PO PACK
17.0000 g | PACK | Freq: Every day | ORAL | Status: DC
  Administered 2013-08-20: 17 g via ORAL
  Filled 2013-08-20 (×2): qty 1

## 2013-08-20 MED ORDER — WARFARIN SODIUM 2.5 MG PO TABS
2.5000 mg | ORAL_TABLET | Freq: Every day | ORAL | Status: DC
  Administered 2013-08-20: 2.5 mg via ORAL
  Filled 2013-08-20 (×2): qty 1

## 2013-08-20 NOTE — Progress Notes (Addendum)
      [address]       [place] [address]             [phone]        5 Days Post-Op Procedure(s) (LRB): AORTIC VALVE REPLACEMENT (AVR) (N/A) REDO MITRAL VALVE (MV) REPLACEMENT (N/A) INTRAOPERATIVE TRANSESOPHAGEAL ECHOCARDIOGRAM (N/A)  Subjective: Patient passing flatus but no bowel movement yet, despite lactulose.  Objective: Vital signs in last 24 hours: Temp:  [97.6 F (36.4 C)-98.4 F (36.9 C)] 98.4 F (36.9 C) (11/09 0528) Pulse Rate:  [81-95] 81 (11/09 0528) Cardiac Rhythm:  [-] Atrial fibrillation (11/08 2040) Resp:  [16-18] 18 (11/09 0528) BP: (120-129)/(60-63) 120/63 mmHg (11/09 0528) SpO2:  [94 %-100 %] 100 % (11/09 0528) Weight:  [79.4 kg (175 lb 0.7 oz)] 79.4 kg (175 lb 0.7 oz) (11/09 0528)  Pre op weight  78 kg Current Weight  08/20/13 79.4 kg (175 lb 0.7 oz)      Intake/Output from previous day: 11/08 0701 - 11/09 0700 In: 960 [P.O.:960] Out: 450 [Urine:450]   Physical Exam:  Cardiovascular: IRRR IRRR, no murmur Pulmonary: Slightly diminished breath sounds at bases; no rales, wheezes, or rhonchi. Abdomen: Soft, non tender, slight distention,bowel sounds present. Extremities: Trace lower extremity edema. Wound: Clean and dry.  No erythema or signs of infection.  Lab Results: CBC:  Recent Labs  08/18/13 0442 08/20/13 0540  WBC 11.3* 7.0  HGB 9.0* 8.2*  HCT 27.8* 25.2*  PLT 77* 128*   BMET:   Recent Labs  08/18/13 0442 08/20/13 0540  NA 140 139  K 4.8 4.2  CL 106 104  CO2 27 29  GLUCOSE 111* 120*  BUN 32* 16  CREATININE 1.46* 0.86  CALCIUM 9.3 9.1    PT/INR:  Lab Results  Component Value Date   INR 1.82* 08/20/2013   INR 1.49 08/19/2013   INR 1.25 08/18/2013   ABG:  INR: Will add last result for INR, ABG once components are confirmed Will add last 4 CBG results once components are confirmed  Assessment/Plan:  1. CV - A fib with CVR. On back up pacer at 60 and is occasionally paced. HR in the  80-90's this am.Lopressor and Cardizem stopped previously secondary to bradycardia. On Coumadin. INR increased from 1.49 to 1.82. Decrease Coumadin to 2.5 daily.  2.  Pulmonary - Chest tubes removed yesterday. On 2 liters of oxygen via [place]-wean as tolerates.Encourage incentive spirometer 3.  Acute blood loss anemia - Last H and H 8.2 and 25.2 4. Creatinine decreased from 1.46 to 0.86 6. Thrombocytopenia - platelets up to 128,000 7. Continue with CRPI 8. EPW to be removed 9. Mira lax if no bowel movement  ZIMMERMAN,DONIELLE MPA-C 08/20/2013,7:36 AM  Patient seen and examined, agree with above

## 2013-08-20 NOTE — Anesthesia Postprocedure Evaluation (Signed)
  Anesthesia Post-op Note  Patient: Jason [name]  Procedure(s) Performed: Procedure(s): AORTIC VALVE REPLACEMENT (AVR) (N/A) REDO MITRAL VALVE (MV) REPLACEMENT (N/A) INTRAOPERATIVE TRANSESOPHAGEAL ECHOCARDIOGRAM (N/A)  Patient Location: ICU  Anesthesia Type:General  Level of Consciousness: sedated  Airway and Oxygen Therapy: Patient remains intubated per anesthesia plan  Post-op Pain: none  Post-op Assessment: Post-op Vital signs reviewed, Patient's Cardiovascular Status Stable and Respiratory Function Stable  Post-op Vital Signs: Reviewed and stable  Complications: No apparent anesthesia complications

## 2013-08-20 NOTE — Progress Notes (Signed)
Discontinued EPW per MD order per hospital policy. Pacing wires intact upon removal. Patient tolerated well, VSS, no bleeding. Patient reminded to remain in bed for 1 hour. Will continue to monitor closely. Lajuana Matte, RN

## 2013-08-20 NOTE — Progress Notes (Signed)
   CARE MANAGEMENT NOTE 08/20/2013  Patient:  Jason [name], Jason [name]   Account Number:  [id number]  Date Initiated:  08/16/2013  Documentation initiated by:  [facility]  Subjective/Objective Assessment:   post op Redo MVR and now AVR.     Action/Plan:   Anticipated DC Date:  08/21/2013   Anticipated DC Plan:  HOME W HOME HEALTH SERVICES      DC Planning Services  CM consult      Choice offered to / List presented to:             Status of service:  In process, will continue to follow Medicare Important Message given?   (If response is "NO", the following Medicare IM given date fields will be blank) Date Medicare IM given:   Date Additional Medicare IM given:    Discharge Disposition:    Per UR Regulation:  Reviewed for med. necessity/level of care/duration of stay  If discussed at Long Length of Stay Meetings, dates discussed:    Comments:  08/19/13 07:26 PT does not recc. HHPT. Requested MD to order RW per PT recc.  Rolling walker to be delivered to room prior to discharge.  No other CM needs communicated.  [name], BSN, CM [phone].  Contact:  Auerbach,[name] Spouse [phone]   [phone]  08/18/13 JULIE AMERSON,RN,BSN [phone] PT SLOW TO PROGRESS.  P.T. CONSULT PENDING TO DETERMINE HOME NEEDS.  WILL FOLLOW.  08-16-13 10:15am [name], RNBSN [phone] patient independent prior to surgery.  Has wife.

## 2013-08-21 ENCOUNTER — Other Ambulatory Visit: Payer: Self-pay | Admitting: Interventional Cardiology

## 2013-08-21 LAB — PROTIME-INR
INR: 1.58 — ABNORMAL HIGH (ref 0.00–1.49)
Prothrombin Time: 18.4 seconds — ABNORMAL HIGH (ref 11.6–15.2)

## 2013-08-21 MED ORDER — LISINOPRIL 10 MG PO TABS
10.0000 mg | ORAL_TABLET | Freq: Every day | ORAL | Status: DC
  Filled 2013-08-21: qty 1

## 2013-08-21 MED ORDER — FUROSEMIDE 40 MG PO TABS
40.0000 mg | ORAL_TABLET | Freq: Every day | ORAL | Status: DC
  Administered 2013-08-21: 40 mg via ORAL
  Filled 2013-08-21: qty 1

## 2013-08-21 MED ORDER — TAMSULOSIN HCL 0.4 MG PO CAPS: 0.4000 mg | ORAL_CAPSULE | Freq: Every day | ORAL | Status: DC

## 2013-08-21 MED ORDER — ASPIRIN 81 MG PO TBEC: 81.0000 mg | DELAYED_RELEASE_TABLET | Freq: Every day | ORAL | Status: DC

## 2013-08-21 MED ORDER — POTASSIUM CHLORIDE ER 20 MEQ PO TBCR: 10.0000 meq | EXTENDED_RELEASE_TABLET | Freq: Every day | ORAL | Status: DC

## 2013-08-21 MED ORDER — WARFARIN SODIUM 5 MG PO TABS
5.0000 mg | ORAL_TABLET | Freq: Every day | ORAL | Status: DC
  Filled 2013-08-21: qty 1

## 2013-08-21 MED ORDER — ADULT MULTIVITAMIN W/MINERALS CH
1.0000 | ORAL_TABLET | Freq: Every day | ORAL | Status: DC
  Administered 2013-08-21: 1 via ORAL
  Filled 2013-08-21: qty 1

## 2013-08-21 MED ORDER — VITAMIN B-12 1000 MCG PO TABS
1000.0000 ug | ORAL_TABLET | Freq: Every day | ORAL | Status: DC
Start: 2013-08-21 — End: 2013-08-21
  Administered 2013-08-21: 1000 ug via ORAL
  Filled 2013-08-21: qty 1

## 2013-08-21 MED ORDER — FUROSEMIDE 40 MG PO TABS: 40.0000 mg | ORAL_TABLET | Freq: Every day | ORAL | Status: DC

## 2013-08-21 MED ORDER — OXYCODONE HCL 5 MG PO TABS: 5.0000 mg | ORAL_TABLET | ORAL | Status: DC | PRN

## 2013-08-21 MED ORDER — ATORVASTATIN CALCIUM 20 MG PO TABS
20.0000 mg | ORAL_TABLET | Freq: Every evening | ORAL | Status: DC
  Filled 2013-08-21: qty 1

## 2013-08-21 MED ORDER — DILTIAZEM HCL ER COATED BEADS 240 MG PO CP24
240.0000 mg | ORAL_CAPSULE | Freq: Every evening | ORAL | Status: DC
  Filled 2013-08-21: qty 1

## 2013-08-21 MED ORDER — MULTIVITAMINS PO CAPS: 1.0000 | ORAL_CAPSULE | Freq: Every day | ORAL | Status: DC

## 2013-08-21 NOTE — Progress Notes (Signed)
Cardiac Rehab [id number] Completed discharge education with pt and wife. He voices understanding. Pt agrees to Outpt. CRP in [place], will send referral. Beatrix Fetters 10:53 AM.08/21/2013

## 2013-08-21 NOTE — Progress Notes (Addendum)
      [address]       [place] [address]             [phone]      6 Days Post-Op Procedure(s) (LRB): AORTIC VALVE REPLACEMENT (AVR) (N/A) REDO MITRAL VALVE (MV) REPLACEMENT (N/A) INTRAOPERATIVE TRANSESOPHAGEAL ECHOCARDIOGRAM (N/A) Subjective:  Jason [name] states he is feeling much better today.  He was evaluated by PT and felt safe to be discharged home.  + Ambulation, + BM  Objective: Vital signs in last 24 hours: Temp:  [97.9 F (36.6 C)-98.4 F (36.9 C)] 98.4 F (36.9 C) (11/10 0455) Pulse Rate:  [73-96] 80 (11/10 0455) Cardiac Rhythm:  [-] Atrial fibrillation (11/09 2103) Resp:  [18-19] 19 (11/10 0455) BP: (104-150)/(57-76) 150/72 mmHg (11/10 0455) SpO2:  [90 %-97 %] 90 % (11/10 0455) Weight:  [165 lb 11.2 oz (75.161 kg)] 165 lb 11.2 oz (75.161 kg) (11/10 0455)  Intake/Output from previous day: 11/09 0701 - 11/10 0700 In: 480 [P.O.:480] Out: 1100 [Urine:1100]  General appearance: alert, cooperative and no distress Neurologic: intact Heart: irregularly irregular rhythm Lungs: clear to auscultation bilaterally Abdomen: soft, non-tender; bowel sounds normal; no masses,  no organomegaly Extremities: extremities normal, atraumatic, no cyanosis or edema Wound: clean and dry  Lab Results:  Recent Labs  08/20/13 0540  WBC 7.0  HGB 8.2*  HCT 25.2*  PLT 128*   BMET:  Recent Labs  08/20/13 0540  NA 139  K 4.2  CL 104  CO2 29  GLUCOSE 120*  BUN 16  CREATININE 0.86  CALCIUM 9.1    PT/INR:  Recent Labs  08/20/13 0540  LABPROT 20.5*  INR 1.82*   ABG    Component Value Date/Time   PHART 7.342* 08/16/2013 0340   HCO3 22.4 08/16/2013 0340   TCO2 24 08/16/2013 1558   ACIDBASEDEF 3.0* 08/16/2013 0340   O2SAT 93.0 08/16/2013 0340   CBG (last 3)   Recent Labs  08/18/13 1628  GLUCAP 104*    Assessment/Plan: S/P Procedure(s) (LRB): AORTIC VALVE REPLACEMENT (AVR) (N/A) REDO MITRAL VALVE (MV) REPLACEMENT (N/A) INTRAOPERATIVE  TRANSESOPHAGEAL ECHOCARDIOGRAM (N/A)  1. CV- Atrial Fibrillation, occasionally bradycardic- not on rate controlling agents, Hypertensive- will restart home Lisinopril 2. INR pending this morning, will adjust Coumadin dose per result 3. Pulm- off oxygen, encouraged use of IS 4. Renal- volume status is below baseline 5. Dispo- patient remains stable, will await PT/INR result, discuss need for rate controlling agent with staff, possibly d/c home today    LOS: 6 days    BARRETT, ERIN 08/21/2013  I have seen and examined the patient and agree with the assessment and plan as outlined.  Restart home medications PTA including lisinopril and Cardizem CD.  No beta blocker due to intermittent bradycardia.  Continue low dose ASA until INR therapeutic.  Jaryd Drew H 08/21/2013 9:06 AM

## 2013-08-22 ENCOUNTER — Telehealth: Payer: Self-pay | Admitting: Interventional Cardiology

## 2013-08-22 DIAGNOSIS — I4891 Unspecified atrial fibrillation: Secondary | ICD-10-CM | POA: Diagnosis not present

## 2013-08-22 DIAGNOSIS — Z7901 Long term (current) use of anticoagulants: Secondary | ICD-10-CM | POA: Diagnosis not present

## 2013-08-22 DIAGNOSIS — Z48812 Encounter for surgical aftercare following surgery on the circulatory system: Secondary | ICD-10-CM | POA: Diagnosis not present

## 2013-08-22 DIAGNOSIS — Z5181 Encounter for therapeutic drug level monitoring: Secondary | ICD-10-CM | POA: Diagnosis not present

## 2013-08-22 DIAGNOSIS — I10 Essential (primary) hypertension: Secondary | ICD-10-CM | POA: Diagnosis not present

## 2013-08-22 DIAGNOSIS — M542 Cervicalgia: Secondary | ICD-10-CM | POA: Diagnosis not present

## 2013-08-22 DIAGNOSIS — E785 Hyperlipidemia, unspecified: Secondary | ICD-10-CM | POA: Diagnosis not present

## 2013-08-22 NOTE — Telephone Encounter (Signed)
FYI    Pt's RN Diann w/ [facility]   Will do pt's  PTINR due 11/13 pt will have it done at home, RN will  call in the results.

## 2013-08-22 NOTE — Telephone Encounter (Signed)
Noted  

## 2013-08-24 ENCOUNTER — Ambulatory Visit (INDEPENDENT_AMBULATORY_CARE_PROVIDER_SITE_OTHER): Payer: Medicare Other | Admitting: Pharmacist

## 2013-08-24 ENCOUNTER — Telehealth: Payer: Self-pay | Admitting: Interventional Cardiology

## 2013-08-24 DIAGNOSIS — Z48812 Encounter for surgical aftercare following surgery on the circulatory system: Secondary | ICD-10-CM | POA: Diagnosis not present

## 2013-08-24 DIAGNOSIS — E785 Hyperlipidemia, unspecified: Secondary | ICD-10-CM | POA: Diagnosis not present

## 2013-08-24 DIAGNOSIS — M542 Cervicalgia: Secondary | ICD-10-CM | POA: Diagnosis not present

## 2013-08-24 DIAGNOSIS — Z7901 Long term (current) use of anticoagulants: Secondary | ICD-10-CM | POA: Diagnosis not present

## 2013-08-24 DIAGNOSIS — I10 Essential (primary) hypertension: Secondary | ICD-10-CM | POA: Diagnosis not present

## 2013-08-24 DIAGNOSIS — I4891 Unspecified atrial fibrillation: Secondary | ICD-10-CM | POA: Diagnosis not present

## 2013-08-24 LAB — POCT INR: INR: 2

## 2013-08-25 NOTE — Telephone Encounter (Signed)
New Problem  Pt recently had surgery// ankles and knees swelling// taken lasix// even with two their still swollen// nurse came 11/13, she wasn't to concerned//Pt request a call back to discuss.Marland Kitchen

## 2013-08-25 NOTE — Telephone Encounter (Addendum)
Per Dr. Eldridge Dace swelling is common post surgery. Continue to monitor and call if swelling persists.

## 2013-08-25 NOTE — Telephone Encounter (Signed)
Spoke with pts wife and he was d/c from [facility] on 08/21/13 after having aortic and mitral valve surgery. Pts wife states he is taking Lasix 40mg  1 tab in the am and 1 tablet at 2pm. Pt is having swelling from his knees down to ankles. Pt denies SOB and CP. Pt is doing pretty good other than having no appetite. Pts weight is stable. Should pt be concerned?

## 2013-08-25 NOTE — Telephone Encounter (Signed)
Pts wife notified and she will call if symptoms worsen. Pt has appt for 09/05/13.

## 2013-08-29 ENCOUNTER — Ambulatory Visit (INDEPENDENT_AMBULATORY_CARE_PROVIDER_SITE_OTHER): Payer: Medicare Other | Admitting: Pharmacist

## 2013-08-29 DIAGNOSIS — Z48812 Encounter for surgical aftercare following surgery on the circulatory system: Secondary | ICD-10-CM | POA: Diagnosis not present

## 2013-08-29 DIAGNOSIS — M542 Cervicalgia: Secondary | ICD-10-CM | POA: Diagnosis not present

## 2013-08-29 DIAGNOSIS — Z7901 Long term (current) use of anticoagulants: Secondary | ICD-10-CM | POA: Diagnosis not present

## 2013-08-29 DIAGNOSIS — I4891 Unspecified atrial fibrillation: Secondary | ICD-10-CM | POA: Diagnosis not present

## 2013-08-29 DIAGNOSIS — E785 Hyperlipidemia, unspecified: Secondary | ICD-10-CM | POA: Diagnosis not present

## 2013-08-29 DIAGNOSIS — I10 Essential (primary) hypertension: Secondary | ICD-10-CM | POA: Diagnosis not present

## 2013-08-29 LAB — POCT INR: INR: 5.2

## 2013-08-30 ENCOUNTER — Ambulatory Visit (INDEPENDENT_AMBULATORY_CARE_PROVIDER_SITE_OTHER): Payer: Medicare Other | Admitting: Pharmacist

## 2013-08-30 DIAGNOSIS — E785 Hyperlipidemia, unspecified: Secondary | ICD-10-CM | POA: Diagnosis not present

## 2013-08-30 DIAGNOSIS — Z7901 Long term (current) use of anticoagulants: Secondary | ICD-10-CM | POA: Diagnosis not present

## 2013-08-30 DIAGNOSIS — I4891 Unspecified atrial fibrillation: Secondary | ICD-10-CM | POA: Diagnosis not present

## 2013-08-30 DIAGNOSIS — I10 Essential (primary) hypertension: Secondary | ICD-10-CM | POA: Diagnosis not present

## 2013-08-30 DIAGNOSIS — M542 Cervicalgia: Secondary | ICD-10-CM | POA: Diagnosis not present

## 2013-08-30 DIAGNOSIS — Z48812 Encounter for surgical aftercare following surgery on the circulatory system: Secondary | ICD-10-CM | POA: Diagnosis not present

## 2013-08-30 LAB — POCT INR: INR: 3

## 2013-09-01 DIAGNOSIS — Z7901 Long term (current) use of anticoagulants: Secondary | ICD-10-CM | POA: Diagnosis not present

## 2013-09-01 DIAGNOSIS — Z48812 Encounter for surgical aftercare following surgery on the circulatory system: Secondary | ICD-10-CM | POA: Diagnosis not present

## 2013-09-01 DIAGNOSIS — M542 Cervicalgia: Secondary | ICD-10-CM | POA: Diagnosis not present

## 2013-09-01 DIAGNOSIS — E785 Hyperlipidemia, unspecified: Secondary | ICD-10-CM | POA: Diagnosis not present

## 2013-09-01 DIAGNOSIS — I4891 Unspecified atrial fibrillation: Secondary | ICD-10-CM | POA: Diagnosis not present

## 2013-09-01 DIAGNOSIS — I10 Essential (primary) hypertension: Secondary | ICD-10-CM | POA: Diagnosis not present

## 2013-09-02 ENCOUNTER — Encounter: Payer: Self-pay | Admitting: Interventional Cardiology

## 2013-09-04 ENCOUNTER — Ambulatory Visit (INDEPENDENT_AMBULATORY_CARE_PROVIDER_SITE_OTHER): Payer: Medicare Other | Admitting: Cardiovascular Disease

## 2013-09-04 DIAGNOSIS — E785 Hyperlipidemia, unspecified: Secondary | ICD-10-CM | POA: Diagnosis not present

## 2013-09-04 DIAGNOSIS — Z48812 Encounter for surgical aftercare following surgery on the circulatory system: Secondary | ICD-10-CM | POA: Diagnosis not present

## 2013-09-04 DIAGNOSIS — I4891 Unspecified atrial fibrillation: Secondary | ICD-10-CM | POA: Diagnosis not present

## 2013-09-04 DIAGNOSIS — Z7901 Long term (current) use of anticoagulants: Secondary | ICD-10-CM | POA: Diagnosis not present

## 2013-09-04 DIAGNOSIS — M542 Cervicalgia: Secondary | ICD-10-CM | POA: Diagnosis not present

## 2013-09-04 DIAGNOSIS — I10 Essential (primary) hypertension: Secondary | ICD-10-CM | POA: Diagnosis not present

## 2013-09-04 LAB — POCT INR: INR: 2.7

## 2013-09-05 ENCOUNTER — Telehealth: Payer: Self-pay | Admitting: Interventional Cardiology

## 2013-09-05 ENCOUNTER — Encounter: Payer: Self-pay | Admitting: Interventional Cardiology

## 2013-09-05 ENCOUNTER — Other Ambulatory Visit: Payer: Self-pay | Admitting: *Deleted

## 2013-09-05 ENCOUNTER — Ambulatory Visit (INDEPENDENT_AMBULATORY_CARE_PROVIDER_SITE_OTHER): Payer: Medicare Other | Admitting: Interventional Cardiology

## 2013-09-05 VITALS — BP 112/70 | HR 57 | Ht 68.0 in | Wt 173.4 lb

## 2013-09-05 DIAGNOSIS — Z952 Presence of prosthetic heart valve: Secondary | ICD-10-CM

## 2013-09-05 DIAGNOSIS — Z953 Presence of xenogenic heart valve: Secondary | ICD-10-CM

## 2013-09-05 DIAGNOSIS — Z79899 Other long term (current) drug therapy: Secondary | ICD-10-CM

## 2013-09-05 DIAGNOSIS — R609 Edema, unspecified: Secondary | ICD-10-CM | POA: Diagnosis not present

## 2013-09-05 DIAGNOSIS — I4891 Unspecified atrial fibrillation: Secondary | ICD-10-CM

## 2013-09-05 DIAGNOSIS — I059 Rheumatic mitral valve disease, unspecified: Secondary | ICD-10-CM

## 2013-09-05 DIAGNOSIS — Z954 Presence of other heart-valve replacement: Secondary | ICD-10-CM

## 2013-09-05 LAB — BASIC METABOLIC PANEL
BUN: 15 mg/dL (ref 6–23)
CO2: 27 mEq/L (ref 19–32)
Calcium: 9.5 mg/dL (ref 8.4–10.5)
Chloride: 99 mEq/L (ref 96–112)
Creatinine, Ser: 1.1 mg/dL (ref 0.4–1.5)
GFR: 65.86 mL/min (ref >60.00)
Glucose, Bld: 89 mg/dL (ref 70–99)
Potassium: 4.8 mEq/L (ref 3.5–5.1)
Sodium: 134 mEq/L — ABNORMAL LOW (ref 135–145)

## 2013-09-05 NOTE — Telephone Encounter (Signed)
Echo scheduled for 09/21/2013 @ 7:30

## 2013-09-05 NOTE — Telephone Encounter (Addendum)
Bonita Quin can you schedule pt for echo and send back to me to notify patient?

## 2013-09-05 NOTE — Telephone Encounter (Signed)
Echo scheduled for 09/21/2013

## 2013-09-05 NOTE — Progress Notes (Signed)
Patient ID: Jason [name], male   DOB: 1931/10/24, 79 y.o.   MRN: [id number]    [address] [place], [place]  [address] Phone: [phone] Fax:  [phone]  Date:  09/05/2013   ID:  Jason [name], DOB October 05, 1932, MRN [id number]  PCP:  Johny Blamer, MD      History of Present Illness: Jason [name] is a 79 y.o. male who has had valvular heart disease and atrial fibrillation. He underwent aortic valve and mitral valve replacement a few weeks ago. He has been doing well.  He had some swelling in his legs.  He was initially supposed to have a Maze procedure along with his valve surgery. Do to scar tissue from his initial surgery 9 years ago, he did not have a maze done. He has been rate controlled. He denies any palpitations. His only chest pain is if he coughs. He does not have pain at any other time. His breathing is okay. He has resumed walking. He is planning on doing cardiac rehabilitation.   Wt Readings from Last 3 Encounters:  09/05/13 173 lb 6.4 oz (78.654 kg)  08/21/13 165 lb 11.2 oz (75.161 kg)  08/21/13 165 lb 11.2 oz (75.161 kg)     Past Medical History  Diagnosis Date  . Hyperlipidemia   . Hypertension   . Heart murmur   . Hydrocele     left  . Kidney stones   . Internal hemorrhoids   . Vocal cord paralysis   . Macular degeneration     right eye  . Blindness     left eye  . OA (osteoarthritis) of neck   . Elevated PSA   . AK (actinic keratosis)   . Severe aortic stenosis 05/29/2013  . S/P mitral valve repair 09/15/2004    Complex valvuloplasty including artificial Goretex neocord placement x4 and 36mm SARP ring annuloplasty via right mini thoracotomy approach - Dr Silvestre Mesi @ [facility]  . Mitral regurgitation 02/25/2011    Recurrent MR s/p mitral valve repair   . Atrial fibrillation, persistent     chronic coumadin therapy  . Complication of anesthesia   . PONV (postoperative nausea and vomiting)     nausea  . Shortness of breath   . GERD  (gastroesophageal reflux disease)   . H/O hiatal hernia   . S/P redo mitral valve replacement and aortic valve replacement with bioprosthetic valves 08/15/2013    27mm Edwards Magna Mitral bovine pericardial tissue valve  . S/P aortic valve replacement with bioprosthetic valve 08/15/2013    23mm Edwards Magna Ease bovine pericardial tissue valve    Current Outpatient Prescriptions  Medication Sig Dispense Refill  . acetaminophen (TYLENOL) 650 MG CR tablet Take 1,300 mg by mouth 2 (two) times daily.       Marland Kitchen aspirin EC 81 MG EC tablet Take 1 tablet (81 mg total) by mouth daily.      Marland Kitchen atorvastatin (LIPITOR) 20 MG tablet Take 20 mg by mouth every evening.       Marland Kitchen b complex vitamins tablet Take 1 tablet by mouth daily.      Marland Kitchen CARTIA XT 240 MG 24 hr capsule Take 240 mg by mouth every evening.       . furosemide (LASIX) 40 MG tablet Take 1 tablet (40 mg total) by mouth daily.  30 tablet    . glucosamine-chondroitin 500-400 MG tablet Take 1 tablet by mouth 2 (two) times daily.       Marland Kitchen  lisinopril (PRINIVIL,ZESTRIL) 20 MG tablet Take 10 mg by mouth daily.       . Multiple Vitamin (MULTIVITAMIN) capsule Take 1 capsule by mouth daily.      . Multiple Vitamins-Minerals (PRESERVISION AREDS PO) Take by mouth 2 (two) times daily.      Marland Kitchen oxyCODONE (OXY IR/ROXICODONE) 5 MG immediate release tablet Take 1-2 tablets (5-10 mg total) by mouth every 3 (three) hours as needed for moderate pain.  30 tablet  0  . potassium chloride 20 MEQ TBCR Take 10 mEq by mouth daily.  30 tablet  3  . tamsulosin (FLOMAX) 0.4 MG CAPS capsule Take 1 capsule (0.4 mg total) by mouth daily.  30 capsule  3  . vitamin B-12 (CYANOCOBALAMIN) 1000 MCG tablet Take 1,000 mcg by mouth daily.      Marland Kitchen warfarin (COUMADIN) 5 MG tablet TAKE PER PHARMD, TAKE 1& 1/2 TABLETS BY MOUTH DAILY EXCEPT 1 TABLET ON MONDAY AND FRIDAY OR AS DIRECTED  45 tablet  5   No current facility-administered medications for this visit.    Allergies:    Allergies    Allergen Reactions  . Penicillins Rash    Social History:  The patient  reports that he quit smoking about 34 years ago. His smoking use included Cigarettes. He smoked 0.00 packs per day for 8 years. He has never used smokeless tobacco. He reports that he does not drink alcohol or use illicit drugs.   Family History:  The patient's family history includes Cancer in his father; Diabetes in his father and mother; Heart attack in his mother; Hyperlipidemia in his father and mother; Hypertension in his brother, father, and mother; Stroke in his father and mother.   ROS:  Please see the history of present illness.  No nausea, vomiting.  No fevers, chills.  No focal weakness.  No dysuria.    All other systems reviewed and negative.   PHYSICAL EXAM: VS:  BP 112/70  Pulse 57  Ht 5\' 8"  (1.727 m)  Wt 173 lb 6.4 oz (78.654 kg)  BMI 26.37 kg/m2  SpO2 95% Well nourished, well developed, in no acute distress HEENT: normal Neck: no JVD, no carotid bruits Cardiac:  normal S1, S2; RRR; 2/6 systolic murmur , significantly less in intensity compared to pre-surgery Lungs:  clear to auscultation bilaterally, no wheezing, rhonchi or rales Abd: soft, nontender, no hepatomegaly Ext: no edema Skin: warm and dry Neuro:   no focal abnormalities noted  EKG:    ASSESSMENT AND PLAN:  1. Atrial fibrillation  Notes: rate controlled. He has not tolerated higher doses of rate controlling medicines in the past. He has been intolerant of beta blockers due to fatigue.   2. Shortness of breath  Notes: Mild. Better with increased diuretics. Likely fluid overload postoperatively  3. Mitral Regurgitation  Notes: Status post bioprosthetic mitral valve placement in November 2014.  4. Acquired stenosis of aortic valve  Notes: Status post bioprosthetic aortic valve replacement in November 2014.  5. edema: Continue increased dose of Lasix. This is improving. This is likely related to postop fluid retention. Will check  echocardiogram to get new baseline after valve surgery.   Signed, Fredric Mare, MD, [facility] 09/05/2013 11:35 AM

## 2013-09-05 NOTE — Telephone Encounter (Signed)
Please schedule echo, V43.3. Ok to happen by mid December.

## 2013-09-05 NOTE — Patient Instructions (Signed)
Your physician recommends that you return for lab work today for [employer].  Your physician recommends that you schedule a follow-up appointment in: 3 months with Dr. Eldridge Dace.  Your physician recommends that you continue on your current medications as directed. Please refer to the Current Medication list given to you today.

## 2013-09-05 NOTE — Addendum Note (Signed)
Addended byOrlene Plum H on: 09/05/2013 02:52 PM   Modules accepted: Orders

## 2013-09-06 ENCOUNTER — Encounter: Payer: Self-pay | Admitting: Cardiology

## 2013-09-06 NOTE — Telephone Encounter (Signed)
Echo scheduled for 09/21/13. Pt aware and CVD imaging letter mailed to pt to sign and bring with him to appt.

## 2013-09-11 ENCOUNTER — Ambulatory Visit (INDEPENDENT_AMBULATORY_CARE_PROVIDER_SITE_OTHER): Payer: Self-pay | Admitting: Physician Assistant

## 2013-09-11 ENCOUNTER — Ambulatory Visit
Admission: RE | Admit: 2013-09-11 | Discharge: 2013-09-11 | Disposition: A | Discharge status: Home / Self Care | Payer: Medicare Other | Source: Ambulatory Visit | Attending: Thoracic Surgery (Cardiothoracic Vascular Surgery) | Admitting: Thoracic Surgery (Cardiothoracic Vascular Surgery)

## 2013-09-11 ENCOUNTER — Ambulatory Visit (INDEPENDENT_AMBULATORY_CARE_PROVIDER_SITE_OTHER): Payer: Medicare Other | Admitting: Internal Medicine

## 2013-09-11 VITALS — BP 114/72 | HR 48 | Resp 20 | Ht 68.0 in | Wt 173.0 lb

## 2013-09-11 DIAGNOSIS — M542 Cervicalgia: Secondary | ICD-10-CM | POA: Diagnosis not present

## 2013-09-11 DIAGNOSIS — I4891 Unspecified atrial fibrillation: Secondary | ICD-10-CM

## 2013-09-11 DIAGNOSIS — Z952 Presence of prosthetic heart valve: Secondary | ICD-10-CM

## 2013-09-11 DIAGNOSIS — J90 Pleural effusion, not elsewhere classified: Secondary | ICD-10-CM | POA: Diagnosis not present

## 2013-09-11 DIAGNOSIS — J9819 Other pulmonary collapse: Secondary | ICD-10-CM | POA: Diagnosis not present

## 2013-09-11 DIAGNOSIS — Z954 Presence of other heart-valve replacement: Secondary | ICD-10-CM

## 2013-09-11 DIAGNOSIS — I340 Nonrheumatic mitral (valve) insufficiency: Secondary | ICD-10-CM

## 2013-09-11 DIAGNOSIS — I059 Rheumatic mitral valve disease, unspecified: Secondary | ICD-10-CM

## 2013-09-11 DIAGNOSIS — I10 Essential (primary) hypertension: Secondary | ICD-10-CM | POA: Diagnosis not present

## 2013-09-11 DIAGNOSIS — Z48812 Encounter for surgical aftercare following surgery on the circulatory system: Secondary | ICD-10-CM | POA: Diagnosis not present

## 2013-09-11 DIAGNOSIS — Z7901 Long term (current) use of anticoagulants: Secondary | ICD-10-CM | POA: Diagnosis not present

## 2013-09-11 DIAGNOSIS — E785 Hyperlipidemia, unspecified: Secondary | ICD-10-CM | POA: Diagnosis not present

## 2013-09-11 DIAGNOSIS — I350 Nonrheumatic aortic (valve) stenosis: Secondary | ICD-10-CM

## 2013-09-11 DIAGNOSIS — I359 Nonrheumatic aortic valve disorder, unspecified: Secondary | ICD-10-CM

## 2013-09-11 LAB — POCT INR: INR: 3

## 2013-09-11 NOTE — Progress Notes (Signed)
[address]       [name] [address]             [phone]          HPI: Patient returns for routine postoperative follow-up having undergone redo MVR and AVR, both with bioprosthetic valves by Dr. Cornelius Moras on 08/15/2013.  The patient's postoperative course was notable for chronic rate controlled atrial fibrillation, as well as urinary retention, which had resolved by the date of discharge.  He was able to go home in good condition on 08/21/2013.  Since hospital discharge, the patient has progressed well.  He denies chest pain, shortness of breath, or palpitations. His appetite is improving and he is walking daily.  He saw Dr. Eldridge Dace on 09/05/2013, and was noted to have increased lower extremity edema, therefore, his Lasix was increased to 40 mg bid.  He has lost 13 pounds since then, and feels his edema is improving.  His most recent INR today was 3.0, and his Coumadin dose was changed to 7.5 mg on Friday, then 5 mg all other days.  He will return for a recheck next week, as well as a repeat echo with Dr. Jim Like.  Overall, he feels well.      Current Outpatient Prescriptions  Medication Sig Dispense Refill  . acetaminophen (TYLENOL) 650 MG CR tablet Take 1,300 mg by mouth 2 (two) times daily.       Marland Kitchen aspirin EC 81 MG EC tablet Take 1 tablet (81 mg total) by mouth daily.      Marland Kitchen atorvastatin (LIPITOR) 20 MG tablet Take 40 mg by mouth 2 (two) times daily.       Marland Kitchen b complex vitamins tablet Take 1 tablet by mouth daily.      Marland Kitchen CARTIA XT 240 MG 24 hr capsule Take 240 mg by mouth every evening.       . furosemide (LASIX) 40 MG tablet Take 40 mg by mouth 2 (two) times daily.      Marland Kitchen glucosamine-chondroitin 500-400 MG tablet Take 1 tablet by mouth 2 (two) times daily.       Marland Kitchen lisinopril (PRINIVIL,ZESTRIL) 20 MG tablet Take 10 mg by mouth daily.       . Multiple Vitamin (MULTIVITAMIN) capsule Take 1 capsule by mouth daily.      . Multiple Vitamins-Minerals (PRESERVISION  AREDS PO) Take by mouth 2 (two) times daily.      . potassium chloride 20 MEQ TBCR Take 10 mEq by mouth daily.  30 tablet  3  . tamsulosin (FLOMAX) 0.4 MG CAPS capsule Take 1 capsule (0.4 mg total) by mouth daily.  30 capsule  3  . vitamin B-12 (CYANOCOBALAMIN) 1000 MCG tablet Take 1,000 mcg by mouth daily.      Marland Kitchen warfarin (COUMADIN) 5 MG tablet Take 7.5 mg on Friday and 5 mg all other days       No current facility-administered medications for this visit.     Physical Exam: BP 114/72 HR 80 Resp 20 Wounds: Healing well, sternum stable to palpation Heart: regular rate and rhythm Lungs: Diminished BS in bases, R>L Extremities: 1+ mid-calf edema   Diagnostic Tests: Chest xray: Dg Chest 2 View  09/11/2013   CLINICAL DATA:  Mitral valve surgery 4 weeks ago  EXAM: CHEST  2 VIEW  COMPARISON:  08/19/2013  FINDINGS: Aortic valve and mitral valve replacement. Cardiac enlargement without heart failure. Negative for pulmonary edema. Small right pleural effusion has  developed since the prior study. Possible small left pleural effusion. Bibasilar atelectasis. No pneumothorax.  IMPRESSION: Interval development of small right pleural effusion. Mild bibasilar atelectasis. Negative for heart failure.   Electronically Signed   By: Marlan Palau M.D.   On: 09/11/2013 13:36       Assessment/Plan: The patient is doing well status post redo MVR, AVR.  He has remained in rate controlled A fib.  Initially in our office today, his heart rate was documented at 48, but I checked his pulse again and it was 80.  He has a new small right pleural effusion on x-ray today which seems to be asymptomatic.  He recently had his Lasix dose increased with good response, so I will continue it at bid for now.  He may begin driving at this point and is encouraged to proceed with cardiac rehab.  We will see him back in 2-3 weeks with a repeat chest x-ray to evaluate his right effusion.

## 2013-09-13 DIAGNOSIS — H353290 Exudative age-related macular degeneration, unspecified eye, stage unspecified: Secondary | ICD-10-CM | POA: Diagnosis not present

## 2013-09-13 DIAGNOSIS — H353190 Nonexudative age-related macular degeneration, unspecified eye, stage unspecified: Secondary | ICD-10-CM | POA: Diagnosis not present

## 2013-09-13 DIAGNOSIS — H35359 Cystoid macular degeneration, unspecified eye: Secondary | ICD-10-CM | POA: Diagnosis not present

## 2013-09-14 DIAGNOSIS — Z48812 Encounter for surgical aftercare following surgery on the circulatory system: Secondary | ICD-10-CM

## 2013-09-21 ENCOUNTER — Ambulatory Visit (HOSPITAL_COMMUNITY): Payer: Medicare Other | Attending: Cardiovascular Disease | Admitting: Radiology

## 2013-09-21 ENCOUNTER — Encounter: Payer: Self-pay | Admitting: Interventional Cardiology

## 2013-09-21 ENCOUNTER — Ambulatory Visit (INDEPENDENT_AMBULATORY_CARE_PROVIDER_SITE_OTHER): Payer: Medicare Other | Admitting: General Practice

## 2013-09-21 DIAGNOSIS — I4891 Unspecified atrial fibrillation: Secondary | ICD-10-CM | POA: Diagnosis not present

## 2013-09-21 DIAGNOSIS — E785 Hyperlipidemia, unspecified: Secondary | ICD-10-CM | POA: Diagnosis not present

## 2013-09-21 DIAGNOSIS — I359 Nonrheumatic aortic valve disorder, unspecified: Secondary | ICD-10-CM | POA: Diagnosis not present

## 2013-09-21 DIAGNOSIS — R609 Edema, unspecified: Secondary | ICD-10-CM | POA: Insufficient documentation

## 2013-09-21 DIAGNOSIS — I059 Rheumatic mitral valve disease, unspecified: Secondary | ICD-10-CM | POA: Insufficient documentation

## 2013-09-21 DIAGNOSIS — R0609 Other forms of dyspnea: Secondary | ICD-10-CM | POA: Diagnosis not present

## 2013-09-21 DIAGNOSIS — I10 Essential (primary) hypertension: Secondary | ICD-10-CM | POA: Insufficient documentation

## 2013-09-21 DIAGNOSIS — Z954 Presence of other heart-valve replacement: Secondary | ICD-10-CM | POA: Insufficient documentation

## 2013-09-21 DIAGNOSIS — R011 Cardiac murmur, unspecified: Secondary | ICD-10-CM | POA: Diagnosis not present

## 2013-09-21 DIAGNOSIS — R0989 Other specified symptoms and signs involving the circulatory and respiratory systems: Secondary | ICD-10-CM | POA: Insufficient documentation

## 2013-09-21 DIAGNOSIS — I079 Rheumatic tricuspid valve disease, unspecified: Secondary | ICD-10-CM | POA: Insufficient documentation

## 2013-09-21 LAB — POCT INR: INR: 2.4

## 2013-09-21 NOTE — Progress Notes (Signed)
Echocardiogram performed.  

## 2013-09-28 ENCOUNTER — Encounter (HOSPITAL_COMMUNITY)
Admission: RE | Admit: 2013-09-28 | Discharge: 2013-09-28 | Disposition: A | Discharge status: Home / Self Care | Payer: Medicare Other | Source: Ambulatory Visit | Attending: Interventional Cardiology | Admitting: Interventional Cardiology

## 2013-09-28 DIAGNOSIS — I079 Rheumatic tricuspid valve disease, unspecified: Secondary | ICD-10-CM | POA: Insufficient documentation

## 2013-09-28 DIAGNOSIS — I700 Atherosclerosis of aorta: Secondary | ICD-10-CM | POA: Insufficient documentation

## 2013-09-28 DIAGNOSIS — I2789 Other specified pulmonary heart diseases: Secondary | ICD-10-CM | POA: Insufficient documentation

## 2013-09-28 DIAGNOSIS — J9819 Other pulmonary collapse: Secondary | ICD-10-CM | POA: Insufficient documentation

## 2013-09-28 DIAGNOSIS — Z5189 Encounter for other specified aftercare: Secondary | ICD-10-CM | POA: Insufficient documentation

## 2013-09-28 DIAGNOSIS — Z79899 Other long term (current) drug therapy: Secondary | ICD-10-CM | POA: Insufficient documentation

## 2013-09-28 DIAGNOSIS — I498 Other specified cardiac arrhythmias: Secondary | ICD-10-CM | POA: Insufficient documentation

## 2013-09-28 DIAGNOSIS — I4891 Unspecified atrial fibrillation: Secondary | ICD-10-CM | POA: Insufficient documentation

## 2013-09-28 DIAGNOSIS — Z954 Presence of other heart-valve replacement: Secondary | ICD-10-CM | POA: Insufficient documentation

## 2013-09-28 DIAGNOSIS — Z7982 Long term (current) use of aspirin: Secondary | ICD-10-CM | POA: Insufficient documentation

## 2013-09-28 DIAGNOSIS — Z7901 Long term (current) use of anticoagulants: Secondary | ICD-10-CM | POA: Insufficient documentation

## 2013-09-28 DIAGNOSIS — I10 Essential (primary) hypertension: Secondary | ICD-10-CM | POA: Insufficient documentation

## 2013-09-28 DIAGNOSIS — Z87891 Personal history of nicotine dependence: Secondary | ICD-10-CM | POA: Insufficient documentation

## 2013-09-28 DIAGNOSIS — Z8249 Family history of ischemic heart disease and other diseases of the circulatory system: Secondary | ICD-10-CM | POA: Insufficient documentation

## 2013-09-28 DIAGNOSIS — I080 Rheumatic disorders of both mitral and aortic valves: Secondary | ICD-10-CM | POA: Insufficient documentation

## 2013-09-28 NOTE — Progress Notes (Signed)
Cardiac Rehab Medication Review by a Pharmacist  Does the patient  feel that his/her medications are working for him/her?  yes  Has the patient been experiencing any side effects to the medications prescribed?  no  Does the patient measure his/her own blood pressure or blood glucose at home?  yes   Does the patient have any problems obtaining medications due to transportation or finances?   no  Understanding of regimen: excellent Understanding of indications: excellent Potential of compliance: excellent    Pharmacist comments: Patient is knowledgable about regimen and seems very compliant.  He understands importance of medications.     Jason [name] 09/28/2013 8:54 AM

## 2013-09-29 ENCOUNTER — Ambulatory Visit (INDEPENDENT_AMBULATORY_CARE_PROVIDER_SITE_OTHER): Payer: Medicare Other | Admitting: Pharmacist

## 2013-09-29 ENCOUNTER — Other Ambulatory Visit: Payer: Self-pay | Admitting: *Deleted

## 2013-09-29 DIAGNOSIS — Z7901 Long term (current) use of anticoagulants: Secondary | ICD-10-CM | POA: Diagnosis not present

## 2013-09-29 DIAGNOSIS — I4891 Unspecified atrial fibrillation: Secondary | ICD-10-CM | POA: Diagnosis not present

## 2013-09-29 DIAGNOSIS — Z48812 Encounter for surgical aftercare following surgery on the circulatory system: Secondary | ICD-10-CM | POA: Diagnosis not present

## 2013-09-29 DIAGNOSIS — E785 Hyperlipidemia, unspecified: Secondary | ICD-10-CM | POA: Diagnosis not present

## 2013-09-29 DIAGNOSIS — I10 Essential (primary) hypertension: Secondary | ICD-10-CM | POA: Diagnosis not present

## 2013-09-29 DIAGNOSIS — M542 Cervicalgia: Secondary | ICD-10-CM | POA: Diagnosis not present

## 2013-09-29 LAB — POCT INR: INR: 1.9

## 2013-10-02 ENCOUNTER — Encounter (HOSPITAL_COMMUNITY)
Admission: RE | Admit: 2013-10-02 | Discharge: 2013-10-02 | Disposition: A | Discharge status: Home / Self Care | Payer: Medicare Other | Source: Ambulatory Visit | Attending: Interventional Cardiology | Admitting: Interventional Cardiology

## 2013-10-02 ENCOUNTER — Encounter (HOSPITAL_COMMUNITY): Payer: Self-pay

## 2013-10-02 ENCOUNTER — Encounter (HOSPITAL_COMMUNITY): Payer: Medicare Other

## 2013-10-02 DIAGNOSIS — Z7901 Long term (current) use of anticoagulants: Secondary | ICD-10-CM | POA: Diagnosis not present

## 2013-10-02 DIAGNOSIS — Z8249 Family history of ischemic heart disease and other diseases of the circulatory system: Secondary | ICD-10-CM | POA: Diagnosis not present

## 2013-10-02 DIAGNOSIS — Z5189 Encounter for other specified aftercare: Secondary | ICD-10-CM | POA: Diagnosis not present

## 2013-10-02 DIAGNOSIS — Z87891 Personal history of nicotine dependence: Secondary | ICD-10-CM | POA: Diagnosis not present

## 2013-10-02 DIAGNOSIS — I4891 Unspecified atrial fibrillation: Secondary | ICD-10-CM | POA: Diagnosis not present

## 2013-10-02 DIAGNOSIS — I498 Other specified cardiac arrhythmias: Secondary | ICD-10-CM | POA: Diagnosis not present

## 2013-10-02 DIAGNOSIS — I079 Rheumatic tricuspid valve disease, unspecified: Secondary | ICD-10-CM | POA: Diagnosis not present

## 2013-10-02 DIAGNOSIS — Z954 Presence of other heart-valve replacement: Secondary | ICD-10-CM | POA: Diagnosis not present

## 2013-10-02 DIAGNOSIS — I2789 Other specified pulmonary heart diseases: Secondary | ICD-10-CM | POA: Diagnosis not present

## 2013-10-02 DIAGNOSIS — Z7982 Long term (current) use of aspirin: Secondary | ICD-10-CM | POA: Diagnosis not present

## 2013-10-02 DIAGNOSIS — I700 Atherosclerosis of aorta: Secondary | ICD-10-CM | POA: Diagnosis not present

## 2013-10-02 DIAGNOSIS — J9819 Other pulmonary collapse: Secondary | ICD-10-CM | POA: Diagnosis not present

## 2013-10-02 DIAGNOSIS — I10 Essential (primary) hypertension: Secondary | ICD-10-CM | POA: Diagnosis not present

## 2013-10-02 DIAGNOSIS — I080 Rheumatic disorders of both mitral and aortic valves: Secondary | ICD-10-CM | POA: Diagnosis not present

## 2013-10-02 DIAGNOSIS — Z79899 Other long term (current) drug therapy: Secondary | ICD-10-CM | POA: Diagnosis not present

## 2013-10-02 NOTE — Progress Notes (Signed)
Pt started cardiac rehab today.  Pt tolerated light exercise without difficulty.  VSS, telemetry-atrial fibrillation.  Aysmptomatic.  PHQ-0.  No psychosocial barriers to rehab participation.  Pt oriented to exercise equipment and routine.  Understanding verbalized.

## 2013-10-04 ENCOUNTER — Encounter (HOSPITAL_COMMUNITY)
Admission: RE | Admit: 2013-10-04 | Discharge: 2013-10-04 | Disposition: A | Discharge status: Home / Self Care | Payer: Medicare Other | Source: Ambulatory Visit | Attending: Interventional Cardiology | Admitting: Interventional Cardiology

## 2013-10-04 ENCOUNTER — Other Ambulatory Visit: Payer: Self-pay | Admitting: *Deleted

## 2013-10-04 ENCOUNTER — Encounter (HOSPITAL_COMMUNITY): Payer: Medicare Other

## 2013-10-04 DIAGNOSIS — I359 Nonrheumatic aortic valve disorder, unspecified: Secondary | ICD-10-CM

## 2013-10-06 ENCOUNTER — Encounter (HOSPITAL_COMMUNITY): Payer: Medicare Other

## 2013-10-09 ENCOUNTER — Encounter: Payer: Self-pay | Admitting: Thoracic Surgery (Cardiothoracic Vascular Surgery)

## 2013-10-09 ENCOUNTER — Encounter (HOSPITAL_COMMUNITY): Payer: Medicare Other

## 2013-10-09 ENCOUNTER — Ambulatory Visit (INDEPENDENT_AMBULATORY_CARE_PROVIDER_SITE_OTHER): Payer: Self-pay | Admitting: Thoracic Surgery (Cardiothoracic Vascular Surgery)

## 2013-10-09 ENCOUNTER — Encounter (HOSPITAL_COMMUNITY)
Admission: RE | Admit: 2013-10-09 | Discharge: 2013-10-09 | Disposition: A | Discharge status: Home / Self Care | Payer: Medicare Other | Source: Ambulatory Visit | Attending: Interventional Cardiology | Admitting: Interventional Cardiology

## 2013-10-09 ENCOUNTER — Ambulatory Visit
Admission: RE | Admit: 2013-10-09 | Discharge: 2013-10-09 | Disposition: A | Discharge status: Home / Self Care | Payer: Medicare Other | Source: Ambulatory Visit | Attending: Thoracic Surgery (Cardiothoracic Vascular Surgery) | Admitting: Thoracic Surgery (Cardiothoracic Vascular Surgery)

## 2013-10-09 VITALS — BP 109/78 | HR 76 | Resp 20 | Ht 68.0 in | Wt 167.0 lb

## 2013-10-09 DIAGNOSIS — I359 Nonrheumatic aortic valve disorder, unspecified: Secondary | ICD-10-CM

## 2013-10-09 DIAGNOSIS — I059 Rheumatic mitral valve disease, unspecified: Secondary | ICD-10-CM

## 2013-10-09 DIAGNOSIS — I350 Nonrheumatic aortic (valve) stenosis: Secondary | ICD-10-CM

## 2013-10-09 DIAGNOSIS — Z954 Presence of other heart-valve replacement: Secondary | ICD-10-CM

## 2013-10-09 DIAGNOSIS — J90 Pleural effusion, not elsewhere classified: Secondary | ICD-10-CM | POA: Diagnosis not present

## 2013-10-09 DIAGNOSIS — I340 Nonrheumatic mitral (valve) insufficiency: Secondary | ICD-10-CM

## 2013-10-09 DIAGNOSIS — Z952 Presence of prosthetic heart valve: Secondary | ICD-10-CM

## 2013-10-09 NOTE — Patient Instructions (Signed)
The patient should continue to avoid any heavy lifting or strenuous use of arms or shoulders for at least a total of three months from the time of surgery.   Endocarditis is a potentially serious infection of heart valves or inside lining of the heart.  It occurs more commonly in patients with diseased heart valves (such as patient's with aortic or mitral valve disease) and in patients who have undergone heart valve repair or replacement.  Certain surgical and dental procedures may put you at risk, such as dental cleaning, other dental procedures, or any surgery involving the respiratory, urinary, gastrointestinal tract, gallbladder or prostate gland.   To minimize your chances for develooping endocarditis, maintain good oral health and seek prompt medical attention for any infections involving the mouth, teeth, gums, skin or urinary tract.  Always notify your doctor or dentist about your underlying heart valve condition before having any invasive procedures. You will need to take antibiotics before certain procedures.        

## 2013-10-09 NOTE — Progress Notes (Signed)
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  His postoperative recovery has been uncomplicated and he was last seen here in our office on 09/11/2013.  Patient has history of chronic persistent atrial fibrillation which predated his recent surgery, and postoperatively he has gone back into rate-controlled atrial fibrillation. He remained anticoagulated with Coumadin and his Coumadin dose has been followed carefully through Dr. Hoyle Barr office.  Prior to hospital discharge the patient was started on Flomax because of urinary retention, although he has no reported history of benign prostatic hypertrophy or bladder outlet obstruction prior to surgery.  The patient returns to the office today for routine followup. He reports doing exceptionally well. He has essentially no residual pain in his chest and he hasn't been taking any sort of pain relievers for quite some time the. His exercise tolerance is quite good and noticeably much better than it was prior to surgery. He is participating in the cardiac rehabilitation program and doing well so far. He has no exertional shortness of breath. He denies any tachypalpitations or dizzy spells. Early following hospital discharge she had some problems with fluid retention looks trimmed edema, but this has resolved completely and he remains quite stable on twice daily Lasix. Overall he feels well and has no complaints.   Current Outpatient Prescriptions  Medication Sig Dispense Refill  . acetaminophen (TYLENOL) 650 MG CR tablet Take 1,300 mg by mouth 2 (two) times daily.       Marland Kitchen aspirin EC 81 MG EC tablet Take 1 tablet (81 mg total) by mouth  daily.      Marland Kitchen atorvastatin (LIPITOR) 20 MG tablet Take 20 mg by mouth at bedtime.       Marland Kitchen b complex vitamins tablet Take 1 tablet by mouth daily.      Marland Kitchen CARTIA XT 240 MG 24 hr capsule Take 240 mg by mouth every evening.       . furosemide (LASIX) 40 MG tablet Take 40 mg by mouth 2 (two) times daily.      Marland Kitchen glucosamine-chondroitin 500-400 MG tablet Take 1 tablet by mouth 2 (two) times daily.       Marland Kitchen lisinopril (PRINIVIL,ZESTRIL) 20 MG tablet Take 10 mg by mouth daily.       . Multiple Vitamin (MULTIVITAMIN) capsule Take 1 capsule by mouth daily.      . Multiple Vitamins-Minerals (PRESERVISION AREDS PO) Take by mouth 2 (two) times daily.      . tamsulosin (FLOMAX) 0.4 MG CAPS capsule Take 1 capsule (0.4 mg total) by mouth daily.  30 capsule  3  . vitamin B-12 (CYANOCOBALAMIN) 1000 MCG tablet Take 1,000 mcg by mouth daily.      Marland Kitchen warfarin (COUMADIN) 5 MG tablet Take 7.5 mg on Monday and Friday and 5 mg all other days       No current facility-administered medications for this visit.      Physical Exam:   BP 109/78  Pulse 76  Resp 20  Ht 5\' 8"  (1.727 m)  Wt 167 lb (75.751 kg)  BMI 25.40 kg/m2  SpO2 96%  General:  Well-appearing  Chest:   Clear to auscultation  CV:   Irregular rate and rhythm without murmur  Incisions:  Healing nicely, sternum is stable  Abdomen:  Soft nontender  Extremities:  Warm and well-perfused with no lower extremity edema  Diagnostic Tests:  Transthoracic Echocardiography  Patient:    Jason [name], Jason [name] MR #:       [id number] Study Date: 09/21/2013 Gender:     M Age:        79 Height:     172.7cm Weight:     78.5kg BSA:        1.65m^2 Pt. Status: Room:    ATTENDING    Jason [name]  SONOGRAPHER  Jason [name], RDCS  ORDERING     Everette Rank, MD  PERFORMING   Everette Rank, MD  REFERRING    Everette Rank, MD cc: Dr. Johny Blamer  ------------------------------------------------------------ LV EF:  50%  ------------------------------------------------------------ Indications:      V43.3 S/P Heart valve replacement.  Atrial fibrillation - 427.31.  ------------------------------------------------------------ History:   PMH:  Acquired from the patient and from the patient's chart.  Dyspnea, murmur, and bilateral lower extremity edema.  Atrial fibrillation.  Aortic valve disease.  Mitral valve disease.  Risk factors: Hypertension. Dyslipidemia.  ------------------------------------------------------------ Study Conclusions  - Left ventricle: Subvalvular mitral apparatus mobile in the   left ventricle. The cavity size was normal. The estimated   ejection fraction was 50%. Although no diagnostic regional   wall motion abnormality was identified, this possibility   cannot be completely excluded on the basis of this study. - Aortic valve: A bioprosthesis was present. - Mitral valve: A bioprosthesis was present. - Left atrium: The atrium was moderately dilated. - Right ventricle: The cavity size was moderately dilated.   Systolic function was mildly reduced. - Right atrium: The atrium was mildly dilated.  ------------------------------------------------------------ Labs, prior tests, procedures, and surgery: Valve surgery.     Mitral valve replacement.  Aortic valve replacement.  Transthoracic echocardiography.  M-mode, complete 2D, spectral Doppler, and color Doppler.  Height:  Height: 172.7cm. Height: 68in.  Weight:  Weight: 78.5kg. Weight: 172.6lb.  Body mass index:  BMI: 26.3kg/m^2.  Body surface area:    BSA: 1.39m^2.  Blood pressure:     112/70.  Patient status:  Outpatient.  Location:  [facility]  ------------------------------------------------------------  ------------------------------------------------------------ Left ventricle:  Subvalvular mitral apparatus mobile in the left ventricle. The cavity size was normal. The estimated ejection fraction was 50%.  Although no diagnostic regional wall motion abnormality was identified, this possibility cannot be completely excluded on the basis of this study.   ------------------------------------------------------------ Aortic valve:  A bioprosthesis was present.  Doppler:   No significant regurgitation.    VTI ratio of LVOT to aortic valve: 0.24. Peak velocity ratio of LVOT to aortic valve: 0.28.    Mean gradient: 12mm Hg (S). Peak gradient: 22mm Hg (S).  ------------------------------------------------------------ Aorta:  Aortic root: The aortic root was normal in size.  ------------------------------------------------------------ Mitral valve:  A bioprosthesis was present.  Doppler:   No significant regurgitation.    Valve area by pressure half-time: 1.75cm^2. Indexed valve area by pressure half-time: 0.91cm^2/m^2.    Mean gradient: 7mm Hg (D). Peak gradient: 21mm Hg (D).  ------------------------------------------------------------ Left atrium:  The atrium was moderately dilated.  ------------------------------------------------------------ Atrial septum:  Poorly visualized.  ------------------------------------------------------------ Right ventricle:  The cavity size was moderately dilated. Systolic function was mildly reduced.  ------------------------------------------------------------ Pulmonic valve:   Poorly visualized.  The valve appears  to be grossly normal.    Doppler:   No significant regurgitation.  ------------------------------------------------------------ Tricuspid valve:   Mildly thickened leaflets.  Doppler: Mild regurgitation.  ------------------------------------------------------------ Pulmonary artery:   Systolic pressure could not be accurately estimated, but is likely at least mildly elevated.  ------------------------------------------------------------ Right atrium:  The atrium was mildly  dilated.  ------------------------------------------------------------ Pericardium:  There was no pericardial effusion.  ------------------------------------------------------------ Systemic veins: Inferior vena cava: The vessel was dilated; the respirophasic diameter changes were blunted (< 50%); findings are consistent with elevated central venous pressure.  ------------------------------------------------------------  2D measurements        Normal  Doppler measurements   Norma Left ventricle                                        l LVID ED,   37.8 mm     43-52   LVOT chord,                         Peak vel,  66 cm/s     ----- PLAX                           S LVID ES,   31.2 mm     23-38   VTI, S    10. cm       ----- chord,                                     9 PLAX                           Aortic valve FS, chord,   17 %      >29     Peak vel, 233 cm/s     ----- PLAX                           S LVPW, ED   9.99 mm     ------  Mean vel, 156 cm/s     ----- IVS/LVPW   1.07        <1.3    S ratio, ED                      VTI, S    45. cm       ----- Vol ED,      94 ml     ------              2 MOD1                           Mean       12 mm Hg    ----- Vol ES,      50 ml     ------  gradient, MOD1                           S EF, MOD1     47 %      ------  Peak       22 mm Hg    ----- Vol index,   49 ml/m^2 ------  gradient, ED, MOD1                       S Vol index,   26 ml/m^2 ------  VTI ratio 0.2          ----- ES, MOD1                       LVOT/AV     4 Ventricular septum             Peak vel  0.2          ----- IVS, ED    10.7 mm     ------  ratio,      8 Aorta                          LVOT/AV Root diam,   32 mm     ------  Mitral valve ED                             Mean vel, 120 cm/s     ----- Left atrium                    D AP dim       52 mm     ------  Pressure  126 ms       ----- AP dim     2.71 cm/m^2 <2.2    half-time index                          Mean         7 mm Hg    -----                                gradient,                                D                                Peak       21 mm Hg    -----                                gradient,                                D                                Area      1.7 cm^2     -----                                (PHT)       5                                Area  0.9 cm^2/m^2 -----                                index       1                                (PHT)                                Annulus   65. cm       -----                                VTI         1                                Systemic veins                                Estimated   5 mm Hg    -----                                CVP   ------------------------------------------------------------ Prepared and Electronically Authenticated by  Everette Rank, MD 2014-12-11T12:51:[id number]        CHEST  2 VIEW   COMPARISON:  09/11/2013 and 08/19/2013   FINDINGS: The small bilateral pleural effusions seen previously have resolved. There is some minimal atelectasis noted in the left base on today's study. The cardio pericardial silhouette is enlarged. Patient is status post median sternotomy with evidence of mitral and aortic valve replacement. No pulmonary edema or focal airspace consolidation. There is a pair of tiny nodular densities at the right base better seen than previously secondary to improved aeration at the right base and interval clearing of the pleural fluid. However, when comparing back to a study from 08/10/2011, these are unchanged, consistent with scarring. Marland Kitchen   IMPRESSION: Interval resolution of the tiny bilateral pleural effusions.   Atelectasis or scarring noted at the left base.   Tiny nodular densities at the right base are better seen than on multiple recent comparison studies because of improved aeration at the right base and resolution of pleural fluid. When comparing back to  a study 08/10/2011, these are unchanged consistent with benign disease such as scarring.     Electronically Signed   By: Kennith Center M.D.   On: 10/09/2013 10:36    Impression:  Patient is doing exceptionally well 6 weeks following aortic valve replacement and redo mitral valve replacement. Followup echocardiogram and chest x-ray both looked good.  He is maintaining stable, rate controlled atrial fibrillation which he had prior to surgery. He is therapeutic on Coumadin. There is probably no reason to continue aspirin at this time.    Plan:  I've encouraged patient to continue to gradually increase his physical activity as tolerated but I've reminded him to refrain from any sort of heavy lifting or strenuous use of his arms or shoulders for least another 6-8 weeks. Otherwise he may continue to increase his activity without any particular limitations. We have not  made any changes in his current medications. All of his questions been addressed.  The patient will return in 3 months for routine followup is to make sure he continues to recover uneventfully.   Salvatore Decent. Cornelius Moras, MD 10/09/2013 10:53 AM

## 2013-10-10 ENCOUNTER — Ambulatory Visit (INDEPENDENT_AMBULATORY_CARE_PROVIDER_SITE_OTHER): Payer: Medicare Other | Admitting: Pharmacist

## 2013-10-10 DIAGNOSIS — I4891 Unspecified atrial fibrillation: Secondary | ICD-10-CM | POA: Diagnosis not present

## 2013-10-10 LAB — POCT INR: INR: 1.9

## 2013-10-11 ENCOUNTER — Encounter (HOSPITAL_COMMUNITY)
Admission: RE | Admit: 2013-10-11 | Discharge: 2013-10-11 | Disposition: A | Discharge status: Home / Self Care | Payer: Medicare Other | Source: Ambulatory Visit | Attending: Interventional Cardiology | Admitting: Interventional Cardiology

## 2013-10-11 ENCOUNTER — Encounter (HOSPITAL_COMMUNITY): Payer: Medicare Other

## 2013-10-13 ENCOUNTER — Encounter (HOSPITAL_COMMUNITY): Payer: Medicare Other

## 2013-10-13 DIAGNOSIS — H35359 Cystoid macular degeneration, unspecified eye: Secondary | ICD-10-CM | POA: Diagnosis not present

## 2013-10-13 DIAGNOSIS — H353290 Exudative age-related macular degeneration, unspecified eye, stage unspecified: Secondary | ICD-10-CM | POA: Diagnosis not present

## 2013-10-16 ENCOUNTER — Encounter (HOSPITAL_COMMUNITY): Payer: Medicare Other

## 2013-10-16 ENCOUNTER — Encounter (HOSPITAL_COMMUNITY)
Admission: RE | Admit: 2013-10-16 | Discharge: 2013-10-16 | Disposition: A | Discharge status: Home / Self Care | Payer: Medicare Other | Source: Ambulatory Visit | Attending: Interventional Cardiology | Admitting: Interventional Cardiology

## 2013-10-16 DIAGNOSIS — Z87891 Personal history of nicotine dependence: Secondary | ICD-10-CM | POA: Insufficient documentation

## 2013-10-16 DIAGNOSIS — I700 Atherosclerosis of aorta: Secondary | ICD-10-CM | POA: Insufficient documentation

## 2013-10-16 DIAGNOSIS — Z5189 Encounter for other specified aftercare: Secondary | ICD-10-CM | POA: Insufficient documentation

## 2013-10-16 DIAGNOSIS — I080 Rheumatic disorders of both mitral and aortic valves: Secondary | ICD-10-CM | POA: Insufficient documentation

## 2013-10-16 DIAGNOSIS — J9819 Other pulmonary collapse: Secondary | ICD-10-CM | POA: Insufficient documentation

## 2013-10-16 DIAGNOSIS — Z7982 Long term (current) use of aspirin: Secondary | ICD-10-CM | POA: Diagnosis not present

## 2013-10-16 DIAGNOSIS — Z8249 Family history of ischemic heart disease and other diseases of the circulatory system: Secondary | ICD-10-CM | POA: Diagnosis not present

## 2013-10-16 DIAGNOSIS — I079 Rheumatic tricuspid valve disease, unspecified: Secondary | ICD-10-CM | POA: Diagnosis not present

## 2013-10-16 DIAGNOSIS — Z79899 Other long term (current) drug therapy: Secondary | ICD-10-CM | POA: Diagnosis not present

## 2013-10-16 DIAGNOSIS — Z954 Presence of other heart-valve replacement: Secondary | ICD-10-CM | POA: Diagnosis not present

## 2013-10-16 DIAGNOSIS — I2789 Other specified pulmonary heart diseases: Secondary | ICD-10-CM | POA: Diagnosis not present

## 2013-10-16 DIAGNOSIS — I498 Other specified cardiac arrhythmias: Secondary | ICD-10-CM | POA: Diagnosis not present

## 2013-10-16 DIAGNOSIS — I4891 Unspecified atrial fibrillation: Secondary | ICD-10-CM | POA: Diagnosis not present

## 2013-10-16 DIAGNOSIS — I10 Essential (primary) hypertension: Secondary | ICD-10-CM | POA: Insufficient documentation

## 2013-10-16 DIAGNOSIS — Z7901 Long term (current) use of anticoagulants: Secondary | ICD-10-CM | POA: Diagnosis not present

## 2013-10-18 ENCOUNTER — Encounter (HOSPITAL_COMMUNITY)
Admission: RE | Admit: 2013-10-18 | Discharge: 2013-10-18 | Disposition: A | Discharge status: Home / Self Care | Payer: Medicare Other | Source: Ambulatory Visit | Attending: Interventional Cardiology | Admitting: Interventional Cardiology

## 2013-10-18 ENCOUNTER — Encounter (HOSPITAL_COMMUNITY): Payer: Medicare Other

## 2013-10-18 NOTE — Progress Notes (Signed)
I reviewed home exercise with Jason [name] on 10/16/2013. The patient was advised to walk 2-4 days per week for 15 minutes, 2 times per day until he can walk 30 minutes continuously.  Pt will also complete one additional day of hand weights outside of CRP II.  Progression of exercise prescription was discussed.  Reviewed THR, pulse, RPE, sign and symptoms, NTG use and when to call 911 or MD.  Pt voiced understanded.  Archie Endo, MS, ACSM RCEP 10/18/2013 10:15 AM

## 2013-10-20 ENCOUNTER — Encounter (HOSPITAL_COMMUNITY): Payer: Medicare Other

## 2013-10-20 ENCOUNTER — Encounter (HOSPITAL_COMMUNITY)
Admission: RE | Admit: 2013-10-20 | Discharge: 2013-10-20 | Disposition: A | Discharge status: Home / Self Care | Payer: Medicare Other | Source: Ambulatory Visit | Attending: Interventional Cardiology | Admitting: Interventional Cardiology

## 2013-10-20 ENCOUNTER — Ambulatory Visit (INDEPENDENT_AMBULATORY_CARE_PROVIDER_SITE_OTHER): Payer: Medicare Other | Admitting: *Deleted

## 2013-10-20 DIAGNOSIS — I4891 Unspecified atrial fibrillation: Secondary | ICD-10-CM | POA: Diagnosis not present

## 2013-10-20 LAB — POCT INR: INR: 2.4

## 2013-10-23 ENCOUNTER — Encounter (HOSPITAL_COMMUNITY): Payer: Medicare Other

## 2013-10-23 ENCOUNTER — Encounter (HOSPITAL_COMMUNITY)
Admission: RE | Admit: 2013-10-23 | Discharge: 2013-10-23 | Disposition: A | Discharge status: Home / Self Care | Payer: Medicare Other | Source: Ambulatory Visit | Attending: Interventional Cardiology | Admitting: Interventional Cardiology

## 2013-10-25 ENCOUNTER — Encounter (HOSPITAL_COMMUNITY): Payer: Medicare Other

## 2013-10-25 ENCOUNTER — Encounter (HOSPITAL_COMMUNITY)
Admission: RE | Admit: 2013-10-25 | Discharge: 2013-10-25 | Disposition: A | Discharge status: Home / Self Care | Payer: Medicare Other | Source: Ambulatory Visit | Attending: Interventional Cardiology | Admitting: Interventional Cardiology

## 2013-10-27 ENCOUNTER — Encounter (HOSPITAL_COMMUNITY)
Admission: RE | Admit: 2013-10-27 | Discharge: 2013-10-27 | Disposition: A | Discharge status: Home / Self Care | Payer: Medicare Other | Source: Ambulatory Visit | Attending: Interventional Cardiology | Admitting: Interventional Cardiology

## 2013-10-27 ENCOUNTER — Encounter (HOSPITAL_COMMUNITY): Payer: Medicare Other

## 2013-10-30 ENCOUNTER — Encounter (HOSPITAL_COMMUNITY)
Admission: RE | Admit: 2013-10-30 | Discharge: 2013-10-30 | Disposition: A | Discharge status: Home / Self Care | Payer: Medicare Other | Source: Ambulatory Visit | Attending: Interventional Cardiology | Admitting: Interventional Cardiology

## 2013-10-30 ENCOUNTER — Encounter (HOSPITAL_COMMUNITY): Payer: Medicare Other

## 2013-11-01 ENCOUNTER — Encounter (HOSPITAL_COMMUNITY)
Admission: RE | Admit: 2013-11-01 | Discharge: 2013-11-01 | Disposition: A | Discharge status: Home / Self Care | Payer: Medicare Other | Source: Ambulatory Visit | Attending: Interventional Cardiology | Admitting: Interventional Cardiology

## 2013-11-01 ENCOUNTER — Encounter (HOSPITAL_COMMUNITY): Payer: Medicare Other

## 2013-11-01 NOTE — Progress Notes (Signed)
Ann Maki 80 y.o. male Nutrition Note Spoke with pt.  Nutrition Plan and Nutrition Survey goals reviewed with pt. Pt is following Step 2 of the Therapeutic Lifestyle Changes diet. Pt reports he has been taking Coumadin for "about 3 years." Pt expressed knowledge of the need to follow a diet with Consistent Vitamin K intake. Pt stated understanding of the information reviewed. Pt aware of nutrition education classes offered and declined to attend classes or receive Nutrition class handouts at this time.  Nutrition Diagnosis   Food-and nutrition-related knowledge deficit related to lack of exposure to information as related to diagnosis of: ? CVD   Nutrition RX/ Estimated Daily Nutrition Needs for: wt maintenance 2050-2300 Kcal, 65-75 gm fat, 13-15 gm sat fat, 2.0-2.3 gm trans-fat, <1500 mg sodium   Nutrition Intervention   Pt's individual nutrition plan including cholesterol goals reviewed with pt.   Benefits of adopting Therapeutic Lifestyle Changes discussed when Medficts reviewed.   Pt to attend the Portion Distortion class   Continue client-centered nutrition education by RD, as part of interdisciplinary care.  Goal(s)   Pt to describe the benefit of including fruits, vegetables, whole grains, and low-fat dairy products in a heart healthy meal plan.  Monitor and Evaluate progress toward nutrition goal with team. Nutrition Risk:  Low   Derek Mound, M.Ed, RD, LDN, CDE 11/01/2013 3:06 PM

## 2013-11-03 ENCOUNTER — Encounter (HOSPITAL_COMMUNITY)
Admission: RE | Admit: 2013-11-03 | Discharge: 2013-11-03 | Disposition: A | Discharge status: Home / Self Care | Payer: Medicare Other | Source: Ambulatory Visit | Attending: Interventional Cardiology | Admitting: Interventional Cardiology

## 2013-11-03 ENCOUNTER — Ambulatory Visit (INDEPENDENT_AMBULATORY_CARE_PROVIDER_SITE_OTHER): Payer: Medicare Other | Admitting: Pharmacist

## 2013-11-03 ENCOUNTER — Encounter (HOSPITAL_COMMUNITY): Payer: Medicare Other

## 2013-11-03 DIAGNOSIS — Z5181 Encounter for therapeutic drug level monitoring: Secondary | ICD-10-CM

## 2013-11-03 DIAGNOSIS — I4891 Unspecified atrial fibrillation: Secondary | ICD-10-CM | POA: Diagnosis not present

## 2013-11-03 LAB — POCT INR: INR: 2

## 2013-11-06 ENCOUNTER — Encounter (HOSPITAL_COMMUNITY)
Admission: RE | Admit: 2013-11-06 | Discharge: 2013-11-06 | Disposition: A | Discharge status: Home / Self Care | Payer: Medicare Other | Source: Ambulatory Visit | Attending: Interventional Cardiology | Admitting: Interventional Cardiology

## 2013-11-06 ENCOUNTER — Encounter (HOSPITAL_COMMUNITY): Payer: Medicare Other

## 2013-11-08 ENCOUNTER — Encounter (HOSPITAL_COMMUNITY)
Admission: RE | Admit: 2013-11-08 | Discharge: 2013-11-08 | Disposition: A | Discharge status: Home / Self Care | Payer: Medicare Other | Source: Ambulatory Visit | Attending: Interventional Cardiology | Admitting: Interventional Cardiology

## 2013-11-08 ENCOUNTER — Encounter (HOSPITAL_COMMUNITY): Payer: Medicare Other

## 2013-11-08 DIAGNOSIS — E785 Hyperlipidemia, unspecified: Secondary | ICD-10-CM | POA: Diagnosis not present

## 2013-11-08 DIAGNOSIS — E876 Hypokalemia: Secondary | ICD-10-CM | POA: Diagnosis not present

## 2013-11-08 DIAGNOSIS — I10 Essential (primary) hypertension: Secondary | ICD-10-CM | POA: Diagnosis not present

## 2013-11-08 DIAGNOSIS — I4891 Unspecified atrial fibrillation: Secondary | ICD-10-CM | POA: Diagnosis not present

## 2013-11-09 ENCOUNTER — Ambulatory Visit: Payer: Medicare Other | Admitting: Interventional Cardiology

## 2013-11-09 DIAGNOSIS — H35059 Retinal neovascularization, unspecified, unspecified eye: Secondary | ICD-10-CM | POA: Diagnosis not present

## 2013-11-09 DIAGNOSIS — H353290 Exudative age-related macular degeneration, unspecified eye, stage unspecified: Secondary | ICD-10-CM | POA: Diagnosis not present

## 2013-11-10 ENCOUNTER — Encounter (HOSPITAL_COMMUNITY): Payer: Medicare Other

## 2013-11-10 ENCOUNTER — Encounter (HOSPITAL_COMMUNITY)
Admission: RE | Admit: 2013-11-10 | Discharge: 2013-11-10 | Disposition: A | Discharge status: Home / Self Care | Payer: Medicare Other | Source: Ambulatory Visit | Attending: Interventional Cardiology | Admitting: Interventional Cardiology

## 2013-11-13 ENCOUNTER — Ambulatory Visit: Payer: Medicare Other | Admitting: Interventional Cardiology

## 2013-11-13 ENCOUNTER — Encounter (HOSPITAL_COMMUNITY)
Admission: RE | Admit: 2013-11-13 | Discharge: 2013-11-13 | Disposition: A | Discharge status: Home / Self Care | Payer: Medicare Other | Source: Ambulatory Visit | Attending: Interventional Cardiology | Admitting: Interventional Cardiology

## 2013-11-13 ENCOUNTER — Encounter (HOSPITAL_COMMUNITY): Payer: Medicare Other

## 2013-11-13 DIAGNOSIS — Z79899 Other long term (current) drug therapy: Secondary | ICD-10-CM | POA: Diagnosis not present

## 2013-11-13 DIAGNOSIS — J9819 Other pulmonary collapse: Secondary | ICD-10-CM | POA: Insufficient documentation

## 2013-11-13 DIAGNOSIS — Z8249 Family history of ischemic heart disease and other diseases of the circulatory system: Secondary | ICD-10-CM | POA: Diagnosis not present

## 2013-11-13 DIAGNOSIS — I080 Rheumatic disorders of both mitral and aortic valves: Secondary | ICD-10-CM | POA: Insufficient documentation

## 2013-11-13 DIAGNOSIS — I700 Atherosclerosis of aorta: Secondary | ICD-10-CM | POA: Diagnosis not present

## 2013-11-13 DIAGNOSIS — I4891 Unspecified atrial fibrillation: Secondary | ICD-10-CM | POA: Insufficient documentation

## 2013-11-13 DIAGNOSIS — I079 Rheumatic tricuspid valve disease, unspecified: Secondary | ICD-10-CM | POA: Insufficient documentation

## 2013-11-13 DIAGNOSIS — Z5189 Encounter for other specified aftercare: Secondary | ICD-10-CM | POA: Insufficient documentation

## 2013-11-13 DIAGNOSIS — I2789 Other specified pulmonary heart diseases: Secondary | ICD-10-CM | POA: Diagnosis not present

## 2013-11-13 DIAGNOSIS — Z87891 Personal history of nicotine dependence: Secondary | ICD-10-CM | POA: Insufficient documentation

## 2013-11-13 DIAGNOSIS — Z954 Presence of other heart-valve replacement: Secondary | ICD-10-CM | POA: Insufficient documentation

## 2013-11-13 DIAGNOSIS — Z7982 Long term (current) use of aspirin: Secondary | ICD-10-CM | POA: Insufficient documentation

## 2013-11-13 DIAGNOSIS — I498 Other specified cardiac arrhythmias: Secondary | ICD-10-CM | POA: Diagnosis not present

## 2013-11-13 DIAGNOSIS — I10 Essential (primary) hypertension: Secondary | ICD-10-CM | POA: Diagnosis not present

## 2013-11-13 DIAGNOSIS — Z7901 Long term (current) use of anticoagulants: Secondary | ICD-10-CM | POA: Insufficient documentation

## 2013-11-15 ENCOUNTER — Encounter (HOSPITAL_COMMUNITY): Payer: Medicare Other

## 2013-11-15 ENCOUNTER — Encounter (HOSPITAL_COMMUNITY)
Admission: RE | Admit: 2013-11-15 | Discharge: 2013-11-15 | Disposition: A | Discharge status: Home / Self Care | Payer: Medicare Other | Source: Ambulatory Visit | Attending: Interventional Cardiology | Admitting: Interventional Cardiology

## 2013-11-17 ENCOUNTER — Encounter (HOSPITAL_COMMUNITY)
Admission: RE | Admit: 2013-11-17 | Discharge: 2013-11-17 | Disposition: A | Discharge status: Home / Self Care | Payer: Medicare Other | Source: Ambulatory Visit | Attending: Interventional Cardiology | Admitting: Interventional Cardiology

## 2013-11-17 ENCOUNTER — Encounter (HOSPITAL_COMMUNITY): Payer: Medicare Other

## 2013-11-20 ENCOUNTER — Encounter (HOSPITAL_COMMUNITY)
Admission: RE | Admit: 2013-11-20 | Discharge: 2013-11-20 | Disposition: A | Discharge status: Home / Self Care | Payer: Medicare Other | Source: Ambulatory Visit | Attending: Interventional Cardiology | Admitting: Interventional Cardiology

## 2013-11-20 ENCOUNTER — Encounter (HOSPITAL_COMMUNITY): Payer: Medicare Other

## 2013-11-20 DIAGNOSIS — H35059 Retinal neovascularization, unspecified, unspecified eye: Secondary | ICD-10-CM | POA: Diagnosis not present

## 2013-11-20 DIAGNOSIS — H353290 Exudative age-related macular degeneration, unspecified eye, stage unspecified: Secondary | ICD-10-CM | POA: Diagnosis not present

## 2013-11-22 ENCOUNTER — Emergency Department (HOSPITAL_COMMUNITY): Payer: Medicare Other

## 2013-11-22 ENCOUNTER — Encounter (HOSPITAL_COMMUNITY): Payer: Self-pay | Admitting: Emergency Medicine

## 2013-11-22 ENCOUNTER — Other Ambulatory Visit: Payer: Self-pay

## 2013-11-22 ENCOUNTER — Encounter (HOSPITAL_COMMUNITY)
Admission: RE | Admit: 2013-11-22 | Discharge: 2013-11-22 | Disposition: A | Discharge status: Home / Self Care | Payer: Medicare Other | Source: Ambulatory Visit | Attending: Interventional Cardiology | Admitting: Interventional Cardiology

## 2013-11-22 ENCOUNTER — Other Ambulatory Visit: Payer: Self-pay | Admitting: Cardiology

## 2013-11-22 ENCOUNTER — Encounter (HOSPITAL_COMMUNITY): Payer: Medicare Other

## 2013-11-22 ENCOUNTER — Telehealth: Payer: Self-pay | Admitting: Cardiology

## 2013-11-22 ENCOUNTER — Emergency Department (HOSPITAL_COMMUNITY)
Admission: EM | Admit: 2013-11-22 | Discharge: 2013-11-22 | Disposition: A | Discharge status: Home / Self Care | Payer: Medicare Other | Attending: Emergency Medicine | Admitting: Emergency Medicine

## 2013-11-22 DIAGNOSIS — I10 Essential (primary) hypertension: Secondary | ICD-10-CM | POA: Diagnosis not present

## 2013-11-22 DIAGNOSIS — Z7901 Long term (current) use of anticoagulants: Secondary | ICD-10-CM | POA: Diagnosis not present

## 2013-11-22 DIAGNOSIS — Z8719 Personal history of other diseases of the digestive system: Secondary | ICD-10-CM | POA: Diagnosis not present

## 2013-11-22 DIAGNOSIS — I4729 Other ventricular tachycardia: Secondary | ICD-10-CM | POA: Diagnosis not present

## 2013-11-22 DIAGNOSIS — I472 Ventricular tachycardia, unspecified: Secondary | ICD-10-CM

## 2013-11-22 DIAGNOSIS — Z79899 Other long term (current) drug therapy: Secondary | ICD-10-CM | POA: Insufficient documentation

## 2013-11-22 DIAGNOSIS — Z87448 Personal history of other diseases of urinary system: Secondary | ICD-10-CM | POA: Insufficient documentation

## 2013-11-22 DIAGNOSIS — E785 Hyperlipidemia, unspecified: Secondary | ICD-10-CM | POA: Diagnosis not present

## 2013-11-22 DIAGNOSIS — Z880 Allergy status to penicillin: Secondary | ICD-10-CM | POA: Diagnosis not present

## 2013-11-22 DIAGNOSIS — Z9889 Other specified postprocedural states: Secondary | ICD-10-CM | POA: Insufficient documentation

## 2013-11-22 DIAGNOSIS — R0602 Shortness of breath: Secondary | ICD-10-CM | POA: Diagnosis not present

## 2013-11-22 DIAGNOSIS — R011 Cardiac murmur, unspecified: Secondary | ICD-10-CM | POA: Diagnosis not present

## 2013-11-22 DIAGNOSIS — Z953 Presence of xenogenic heart valve: Secondary | ICD-10-CM

## 2013-11-22 DIAGNOSIS — E7800 Pure hypercholesterolemia, unspecified: Secondary | ICD-10-CM | POA: Diagnosis present

## 2013-11-22 DIAGNOSIS — Z87891 Personal history of nicotine dependence: Secondary | ICD-10-CM | POA: Insufficient documentation

## 2013-11-22 DIAGNOSIS — Z87442 Personal history of urinary calculi: Secondary | ICD-10-CM | POA: Insufficient documentation

## 2013-11-22 DIAGNOSIS — Z8739 Personal history of other diseases of the musculoskeletal system and connective tissue: Secondary | ICD-10-CM | POA: Diagnosis not present

## 2013-11-22 DIAGNOSIS — Z872 Personal history of diseases of the skin and subcutaneous tissue: Secondary | ICD-10-CM | POA: Diagnosis not present

## 2013-11-22 DIAGNOSIS — Z8669 Personal history of other diseases of the nervous system and sense organs: Secondary | ICD-10-CM | POA: Insufficient documentation

## 2013-11-22 DIAGNOSIS — I4819 Other persistent atrial fibrillation: Secondary | ICD-10-CM | POA: Diagnosis present

## 2013-11-22 LAB — CBC WITH DIFFERENTIAL/PLATELET
Basophils Absolute: 0 10*3/uL (ref 0.0–0.1)
Basophils Relative: 0 % (ref 0–1)
Eosinophils Absolute: 0.1 10*3/uL (ref 0.0–0.7)
Eosinophils Relative: 1 % (ref 0–5)
HCT: 39.3 % (ref 39.0–52.0)
Hemoglobin: 12.9 g/dL — ABNORMAL LOW (ref 13.0–17.0)
Lymphocytes Relative: 26 % (ref 12–46)
Lymphs Abs: 1.3 10*3/uL (ref 0.7–4.0)
MCH: 27.3 pg (ref 26.0–34.0)
MCHC: 32.8 g/dL (ref 30.0–36.0)
MCV: 83.3 fL (ref 78.0–100.0)
Monocytes Absolute: 0.5 10*3/uL (ref 0.1–1.0)
Monocytes Relative: 10 % (ref 3–12)
Neutro Abs: 3.2 10*3/uL (ref 1.7–7.7)
Neutrophils Relative %: 63 % (ref 43–77)
Platelets: 170 10*3/uL (ref 150–400)
RBC: 4.72 MIL/uL (ref 4.22–5.81)
RDW: 15.6 % — ABNORMAL HIGH (ref 11.5–15.5)
WBC: 5 10*3/uL (ref 4.0–10.5)

## 2013-11-22 LAB — COMPREHENSIVE METABOLIC PANEL
ALT: 17 U/L (ref 0–53)
AST: 25 U/L (ref 0–37)
Albumin: 3.9 g/dL (ref 3.5–5.2)
Alkaline Phosphatase: 97 U/L (ref 39–117)
BUN: 17 mg/dL (ref 6–23)
CO2: 26 mEq/L (ref 19–32)
Calcium: 9.8 mg/dL (ref 8.4–10.5)
Chloride: 101 mEq/L (ref 96–112)
Creatinine, Ser: 1.07 mg/dL (ref 0.50–1.35)
GFR calc Af Amer: 74 mL/min — ABNORMAL LOW (ref >90)
GFR calc non Af Amer: 64 mL/min — ABNORMAL LOW (ref >90)
Glucose, Bld: 90 mg/dL (ref 70–99)
Potassium: 4 mEq/L (ref 3.7–5.3)
Sodium: 140 mEq/L (ref 137–147)
Total Bilirubin: 0.5 mg/dL (ref 0.3–1.2)
Total Protein: 7.1 g/dL (ref 6.0–8.3)

## 2013-11-22 LAB — PROTIME-INR
INR: 2.14 — ABNORMAL HIGH (ref 0.00–1.49)
Prothrombin Time: 23.2 seconds — ABNORMAL HIGH (ref 11.6–15.2)

## 2013-11-22 LAB — TROPONIN I
Troponin I: <0.3 ng/mL (ref <0.30)
Troponin I: <0.3 ng/mL (ref <0.30)

## 2013-11-22 LAB — MAGNESIUM: Magnesium: 2.1 mg/dL (ref 1.5–2.5)

## 2013-11-22 LAB — PRO B NATRIURETIC PEPTIDE: Pro B Natriuretic peptide (BNP): 1061 pg/mL — ABNORMAL HIGH (ref 0–450)

## 2013-11-22 MED ORDER — METOPROLOL TARTRATE 25 MG PO TABS
12.5000 mg | ORAL_TABLET | Freq: Two times a day (BID) | ORAL | Status: DC
  Administered 2013-11-22: 12.5 mg via ORAL
  Filled 2013-11-22: qty 1

## 2013-11-22 MED ORDER — METOPROLOL TARTRATE 12.5 MG HALF TABLET: 12.5000 mg | ORAL_TABLET | Freq: Two times a day (BID) | ORAL | Status: DC

## 2013-11-22 NOTE — ED Notes (Signed)
Per Arville Go (Cardiac Rehab RN at [facility]) - pt was in rehab and began to have several frequent runs of V-tach on monitor. Pt asymptomatic, denies pain, denies feeling light headed. HR 94 and afib on monitor. Pt has hx of same. Pt had AV and mitral valve replacement November 2014

## 2013-11-22 NOTE — H&P (Signed)
Patient ID: Jason [name] MRN: [id number], DOB/AGE: 1932/04/10   Admit date: 11/22/2013   Primary Physician: Shirline Frees, MD Primary Cardiologist: Dr Irish Lack  HPI:   80 y/o with a history of valvular heart disease. He had minimally invasive MV repair at [facility] in 2005 by Dr Evelina Dun. He had a cardiac cath in 2012 that showed no significant CAD but he did have AS and MR. He was studied again in Sept 2014 with the same results. GI assisted TEE was done then and showed severe AS. He ultimately underwent tissue AVR and MVR 08/15/13. His post op course was unremarkable except for some bradycardia necessitating adjustment of his cardiac medications, his Lopressor was stopped He has otherwise done well since his surgery. Echo in December showed an EF of 50%. Today at cardiac rehab he reportedly had frequent runs of asymptomatic NSVT runs of 3-4 bts.  He was sent to the ER for further evaluation. He is currently in AF with PVCs. He denies any syncope or palpitations.    Problem List: Past Medical History  Diagnosis Date  . Hyperlipidemia   . Hypertension   . Heart murmur   . Hydrocele     left  . Kidney stones   . Internal hemorrhoids   . Vocal cord paralysis   . Macular degeneration     right eye  . Blindness     left eye  . OA (osteoarthritis) of neck   . Elevated PSA   . AK (actinic keratosis)   . Severe aortic stenosis 11/14    tissue AVR   . S/P mitral valve repair 09/15/2004    Complex valvuloplasty including artificial Goretex neocord placement x4 and 32mm SARP ring annuloplasty via right mini thoracotomy approach - Dr Evelina Dun @ [facility]  . Mitral regurgitation 02/25/2011    Recurrent MR s/p mitral valve repair   . Atrial fibrillation, persistent     chronic coumadin therapy  . GERD (gastroesophageal reflux disease)   . H/O hiatal hernia   . S/P redo mitral valve replacement and aortic valve replacement with bioprosthetic valves 08/15/2013    31mm Edwards Magna Mitral bovine  pericardial tissue valve  . S/P aortic valve replacement with bioprosthetic valve 08/15/2013    27mm Edwards Magna Ease bovine pericardial tissue valve    Past Surgical History  Procedure Laterality Date  . Groin mass open biopsy  2012  . Mitral valve repair  09/16/2011    @ [facility], Dr Evelina Dun  . Inguinal hernia repair  08/2011  . Tee without cardioversion N/A 07/05/2013    Procedure: TRANSESOPHAGEAL ECHOCARDIOGRAM (TEE);  Surgeon: Casandra Doffing, MD;  Location: [facility] ENDOSCOPY;  Service: Cardiovascular;  Laterality: N/A;  . Tee without cardioversion N/A 08/02/2013    Procedure: TRANSESOPHAGEAL ECHOCARDIOGRAM (TEE);  Surgeon: Casandra Doffing, MD;  Location: [facility] ENDOSCOPY;  Service: Cardiovascular;  Laterality: N/A;  . Esophagogastroduodenoscopy N/A 08/02/2013    Procedure: ESOPHAGOGASTRODUODENOSCOPY (EGD);  Surgeon: Casandra Doffing, MD;  Location: [facility] ENDOSCOPY;  Service: Cardiovascular;  Laterality: N/A;  . Hemorrhoid surgery    . Eye surgery Right     infection  . Aortic valve replacement N/A 08/15/2013    Procedure: AORTIC VALVE REPLACEMENT (AVR);  Surgeon: Rexene Alberts, MD;  Location: [place];  Service: Open Heart Surgery;  Laterality: N/A;  . Mitral valve repair N/A 08/15/2013    Procedure: REDO MITRAL VALVE (MV) REPLACEMENT;  Surgeon: Rexene Alberts, MD;  Location: [place];  Service: Open Heart Surgery;  Laterality: N/A;  .  Intraoperative transesophageal echocardiogram N/A 08/15/2013    Procedure: INTRAOPERATIVE TRANSESOPHAGEAL ECHOCARDIOGRAM;  Surgeon: Rexene Alberts, MD;  Location: [place];  Service: Open Heart Surgery;  Laterality: N/A;     Allergies:  Allergies  Allergen Reactions  . Penicillins Rash     Home Medications No current facility-administered medications for this encounter.   Current Outpatient Prescriptions  Medication Sig Dispense Refill  . acetaminophen (TYLENOL) 650 MG CR tablet Take 1,300 mg by mouth 2 (two) times daily.       Marland Kitchen atorvastatin (LIPITOR) 20 MG tablet Take 20  mg by mouth at bedtime.       Marland Kitchen b complex vitamins tablet Take 1 tablet by mouth daily.      Marland Kitchen CARTIA XT 240 MG 24 hr capsule Take 240 mg by mouth every evening.       . furosemide (LASIX) 40 MG tablet Take 40 mg by mouth 2 (two) times daily.      Marland Kitchen glucosamine-chondroitin 500-400 MG tablet Take 1 tablet by mouth 2 (two) times daily.       Marland Kitchen lisinopril (PRINIVIL,ZESTRIL) 10 MG tablet Take 10 mg by mouth daily.      . Multiple Vitamin (MULTIVITAMIN) capsule Take 1 capsule by mouth daily.      . Multiple Vitamins-Minerals (PRESERVISION AREDS PO) Take 1 tablet by mouth 2 (two) times daily.       . vitamin B-12 (CYANOCOBALAMIN) 1000 MCG tablet Take 1,000 mcg by mouth daily.      Marland Kitchen warfarin (COUMADIN) 5 MG tablet Take 5-7.5 mg by mouth See admin instructions. Take 7.5 mg in the evening on Monday, Wednesday, and Friday ; then 5 mg in the evening on Tuesday, Thursday, Saturday and Sunday         Family History  Problem Relation Age of Onset  . Cancer Father     colon  . Hypertension Father   . Diabetes Father   . Hyperlipidemia Father   . Stroke Father   . Hypertension Mother   . Diabetes Mother   . Hyperlipidemia Mother   . Stroke Mother   . Heart attack Mother   . Hypertension Brother     MI, S/P MVR     History   Social History  . Marital Status: Married    Spouse Name: N/A    Number of Children: 0  . Years of Education: N/A   Occupational History  . retired    Social History Main Topics  . Smoking status: Former Smoker -- 8 years    Types: Cigarettes    Quit date: 10/12/1978  . Smokeless tobacco: Never Used  . Alcohol Use: No  . Drug Use: No  . Sexual Activity: Not on file   Other Topics Concern  . Not on file   Social History Narrative  . No narrative on file     Review of Systems: General: negative for chills, fever, night sweats or weight changes.  Cardiovascular: negative for chest pain, dyspnea on exertion, edema, orthopnea, palpitations, paroxysmal  nocturnal dyspnea or shortness of breath Dermatological: negative for rash Respiratory: negative for cough or wheezing Urologic: negative for hematuria Abdominal: negative for nausea, vomiting, diarrhea, bright red blood per rectum, melena, or hematemesis Neurologic: negative for visual changes, syncope, or dizziness All other systems reviewed and are otherwise negative except as noted above.  Physical Exam: Blood pressure 136/84, pulse 76, temperature 98.1 F (36.7 C), temperature source Oral, resp. rate 13, SpO2 97.00%.  General appearance: alert,  cooperative and no distress Neck: no carotid bruit and no JVD Lungs: clear to auscultation bilaterally Heart: regular rate and rhythm Abdomen: soft, non-tender; bowel sounds normal; no masses,  no organomegaly Extremities: extremities normal, atraumatic, no cyanosis or edema Pulses: 2+ and symmetric Skin: Skin color, texture, turgor normal. No rashes or lesions Neurologic: Grossly normal    Labs:   Results for orders placed during the hospital encounter of 11/22/13 (from the past 24 hour(s))  CBC WITH DIFFERENTIAL     Status: Abnormal   Collection Time    11/22/13  3:00 PM      Result Value Ref Range   WBC 5.0  4.0 - 10.5 K/uL   RBC 4.72  4.22 - 5.81 MIL/uL   Hemoglobin 12.9 (*) 13.0 - 17.0 g/dL   HCT 11.0  31.5 - 94.5 %   MCV 83.3  78.0 - 100.0 fL   MCH 27.3  26.0 - 34.0 pg   MCHC 32.8  30.0 - 36.0 g/dL   RDW 85.9 (*) 29.2 - 44.6 %   Platelets 170  150 - 400 K/uL   Neutrophils Relative % 63  43 - 77 %   Neutro Abs 3.2  1.7 - 7.7 K/uL   Lymphocytes Relative 26  12 - 46 %   Lymphs Abs 1.3  0.7 - 4.0 K/uL   Monocytes Relative 10  3 - 12 %   Monocytes Absolute 0.5  0.1 - 1.0 K/uL   Eosinophils Relative 1  0 - 5 %   Eosinophils Absolute 0.1  0.0 - 0.7 K/uL   Basophils Relative 0  0 - 1 %   Basophils Absolute 0.0  0.0 - 0.1 K/uL  COMPREHENSIVE METABOLIC PANEL     Status: Abnormal   Collection Time    11/22/13  3:00 PM       Result Value Ref Range   Sodium 140  137 - 147 mEq/L   Potassium 4.0  3.7 - 5.3 mEq/L   Chloride 101  96 - 112 mEq/L   CO2 26  19 - 32 mEq/L   Glucose, Bld 90  70 - 99 mg/dL   BUN 17  6 - 23 mg/dL   Creatinine, Ser 2.86  0.50 - 1.35 mg/dL   Calcium 9.8  8.4 - 38.1 mg/dL   Total Protein 7.1  6.0 - 8.3 g/dL   Albumin 3.9  3.5 - 5.2 g/dL   AST 25  0 - 37 U/L   ALT 17  0 - 53 U/L   Alkaline Phosphatase 97  39 - 117 U/L   Total Bilirubin 0.5  0.3 - 1.2 mg/dL   GFR calc non Af Amer 64 (*) >90 mL/min   GFR calc Af Amer 74 (*) >90 mL/min  TROPONIN I     Status: None   Collection Time    11/22/13  3:00 PM      Result Value Ref Range   Troponin I <0.30  <0.30 ng/mL  MAGNESIUM     Status: None   Collection Time    11/22/13  3:00 PM      Result Value Ref Range   Magnesium 2.1  1.5 - 2.5 mg/dL  PROTIME-INR     Status: Abnormal   Collection Time    11/22/13  3:00 PM      Result Value Ref Range   Prothrombin Time 23.2 (*) 11.6 - 15.2 seconds   INR 2.14 (*) 0.00 - 1.49  PRO B NATRIURETIC PEPTIDE  Status: Abnormal   Collection Time    11/22/13  3:00 PM      Result Value Ref Range   Pro B Natriuretic peptide (BNP) 1061.0 (*) 0 - 450 pg/mL     Radiology/Studies: Dg Chest 2 View  11/22/2013   CLINICAL DATA:  Shortness of breath.  EXAM: CHEST  2 VIEW  COMPARISON:  DG CHEST 2 VIEW dated 10/09/2013; CT ANGIO CHEST AORTA W/CM \\T \/OR WO/CM dated 07/24/2013  FINDINGS: Low lung volumes. Patient is status post median sternotomy, aortic valve replacement and mitral valve repair. The cardiac silhouette is enlarged. Aorta is tortuous and ectatic. Small nodules within the right lung base are once again appreciated. The larger more superior nodule appears slightly more prominent though this is likely secondary to technique particularly considering the stability of these nodules when compared to previous study. An ill-defined area of increased density projects in the left lung base slightly more prominent  when compared to previous study this tube may be secondary stent technique. There is cephalization of the pulmonary vasculature is not appear to be significant peribronchial cuffing. Otherwise no focal regions of consolidation identified. The osseous structures unremarkable.  IMPRESSION: There findings which may reflect pulmonary vascular congestion without overt edema. No focal regions of consolidation appreciated. Findings within the right lung base likely accentuated due to shallow inspiration. Increased density in the left lung base likely representing atelectasis or scarring also accentuated due to technique.   Electronically Signed   By: Margaree Mackintosh M.D.   On: 11/22/2013 15:41    EKG:AF LVH PVCs  ASSESSMENT AND PLAN:  Principal Problem:   NSVT (nonsustained ventricular tachycardia) Active Problems:   Atrial fibrillation, persistent   S/P redo mitral valve replacement and aortic valve replacement with bioprosthetic valves Aug 15 2013   Chronic anticoagulation   HTN (hypertension)   Dyslipidemia   PLAN: Resume low dose Metoprolol. OP event monitor. Consider increasing Lasix - BNP 1061 and CXR shows vascular congestion though he has no complaints of dyspnea or edema. MD to see.    Henri Medal, PA-C 11/22/2013, 4:47 PM  History and all data above reviewed.  Patient examined.  I agree with the findings as above. The patient had some runs of NSVT but no symptoms at cardiac rehab.  He otherwise is feeling well.  The patient denies any new symptoms such as chest discomfort, neck or arm discomfort. There has been no new shortness of breath, PND or orthopnea. There have been no reported palpitations, presyncope or syncope. .   The patient exam reveals [id number] afib (chronic)  ,  Lungs: Clear  ,  Abd: Positive bowel sounds, no rebound no guarding, Ext No edema  .  All available labs, radiology testing, previous records reviewed. Agree with documented assessment and plan. NSVT.  No  symptoms.  OK to start low dose beta blocker and then follow with a 24 hour Holter.  Follow up with Dr. Irish Lack.    Jeneen Rinks Jarissa Sheriff  5:41 PM  11/22/2013

## 2013-11-22 NOTE — Progress Notes (Signed)
Pt having frequent 3-4 beat runs of Vtach at cardiac rehab today.   Pt asymptomatic, denies chest pain, dyspnea, dizziness.  BP-130/84, HR-115 at rest, underlying afib.   Pt overexerting on treadmill with incline set at 9. 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   Exercise stopped. Ventricular Ectopy persisted at rest. Rapid response called. Cecilie Kicks, PA notified and recommended pt be evaluated in ED.  Pt transported via stretcher to ED in stable condition.   Report given to receiving RN.  Pt wife Abe People notified of pt transfer.

## 2013-11-22 NOTE — ED Provider Notes (Signed)
CSN: [id number]     Arrival date & time 11/22/13  1439 History   First MD Initiated Contact with Patient 11/22/13 1455     Chief Complaint  Patient presents with  . Tachycardia     (Consider location/radiation/quality/duration/timing/severity/associated sxs/prior Treatment) HPI Comments: Patient arrives from cardiac rehabilitation after having episodes of PVCs and episodes of V. tach on the monitor. he denies complaint. He had an aortic and mitral valve replacements in October. He is on Coumadin for history of atrial fibrillation. Denies chest pain, shortness of breath, dizziness, nausea vomiting. No diarrhea. Denies any recent medication changes.  The history is provided by the patient.    Past Medical History  Diagnosis Date  . Hyperlipidemia   . Hypertension   . Heart murmur   . Hydrocele     left  . Kidney stones   . Internal hemorrhoids   . Vocal cord paralysis   . Macular degeneration     right eye  . Blindness     left eye  . OA (osteoarthritis) of neck   . Elevated PSA   . AK (actinic keratosis)   . Severe aortic stenosis 11/14    tissue AVR   . S/P mitral valve repair 09/15/2004    Complex valvuloplasty including artificial Goretex neocord placement x4 and 52mm SARP ring annuloplasty via right mini thoracotomy approach - Dr Evelina Dun @ [facility]  . Mitral regurgitation 02/25/2011    Recurrent MR s/p mitral valve repair   . Atrial fibrillation, persistent     chronic coumadin therapy  . GERD (gastroesophageal reflux disease)   . H/O hiatal hernia   . S/P redo mitral valve replacement and aortic valve replacement with bioprosthetic valves 08/15/2013    72mm Edwards Magna Mitral bovine pericardial tissue valve  . S/P aortic valve replacement with bioprosthetic valve 08/15/2013    25mm Edwards Magna Ease bovine pericardial tissue valve   Past Surgical History  Procedure Laterality Date  . Groin mass open biopsy  2012  . Mitral valve repair  09/16/2011    @ [facility], Dr Evelina Dun   . Inguinal hernia repair  08/2011  . Tee without cardioversion N/A 07/05/2013    Procedure: TRANSESOPHAGEAL ECHOCARDIOGRAM (TEE);  Surgeon: Casandra Doffing, MD;  Location: [facility] ENDOSCOPY;  Service: Cardiovascular;  Laterality: N/A;  . Tee without cardioversion N/A 08/02/2013    Procedure: TRANSESOPHAGEAL ECHOCARDIOGRAM (TEE);  Surgeon: Casandra Doffing, MD;  Location: [facility] ENDOSCOPY;  Service: Cardiovascular;  Laterality: N/A;  . Esophagogastroduodenoscopy N/A 08/02/2013    Procedure: ESOPHAGOGASTRODUODENOSCOPY (EGD);  Surgeon: Casandra Doffing, MD;  Location: [facility] ENDOSCOPY;  Service: Cardiovascular;  Laterality: N/A;  . Hemorrhoid surgery    . Eye surgery Right     infection  . Aortic valve replacement N/A 08/15/2013    Procedure: AORTIC VALVE REPLACEMENT (AVR);  Surgeon: Rexene Alberts, MD;  Location: [place];  Service: Open Heart Surgery;  Laterality: N/A;  . Mitral valve repair N/A 08/15/2013    Procedure: REDO MITRAL VALVE (MV) REPLACEMENT;  Surgeon: Rexene Alberts, MD;  Location: [place];  Service: Open Heart Surgery;  Laterality: N/A;  . Intraoperative transesophageal echocardiogram N/A 08/15/2013    Procedure: INTRAOPERATIVE TRANSESOPHAGEAL ECHOCARDIOGRAM;  Surgeon: Rexene Alberts, MD;  Location: [place];  Service: Open Heart Surgery;  Laterality: N/A;   Family History  Problem Relation Age of Onset  . Cancer Father     colon  . Hypertension Father   . Diabetes Father   . Hyperlipidemia Father   .  Stroke Father   . Hypertension Mother   . Diabetes Mother   . Hyperlipidemia Mother   . Stroke Mother   . Heart attack Mother   . Hypertension Brother     MI, S/P MVR   History  Substance Use Topics  . Smoking status: Former Smoker -- 8 years    Types: Cigarettes    Quit date: 10/12/1978  . Smokeless tobacco: Never Used  . Alcohol Use: No    Review of Systems  Constitutional: Negative for fever and activity change.  Respiratory: Negative for cough and chest tightness.   Cardiovascular:  Negative for chest pain and palpitations.  Gastrointestinal: Negative for nausea, vomiting and abdominal pain.  Genitourinary: Negative for dysuria and hematuria.  Musculoskeletal: Negative for arthralgias, back pain and myalgias.  Skin: Negative for rash.  Neurological: Negative for dizziness, weakness and headaches.  A complete 10 system review of systems was obtained and all systems are negative except as noted in the HPI and PMH.      Allergies  Penicillins  Home Medications   Current Outpatient Rx  Name  Route  Sig  Dispense  Refill  . acetaminophen (TYLENOL) 650 MG CR tablet   Oral   Take 1,300 mg by mouth 2 (two) times daily.          Marland Kitchen atorvastatin (LIPITOR) 20 MG tablet   Oral   Take 20 mg by mouth at bedtime.          Marland Kitchen b complex vitamins tablet   Oral   Take 1 tablet by mouth daily.         Marland Kitchen CARTIA XT 240 MG 24 hr capsule   Oral   Take 240 mg by mouth every evening.          . furosemide (LASIX) 40 MG tablet   Oral   Take 40 mg by mouth 2 (two) times daily.         Marland Kitchen glucosamine-chondroitin 500-400 MG tablet   Oral   Take 1 tablet by mouth 2 (two) times daily.          Marland Kitchen lisinopril (PRINIVIL,ZESTRIL) 10 MG tablet   Oral   Take 10 mg by mouth daily.         . Multiple Vitamin (MULTIVITAMIN) capsule   Oral   Take 1 capsule by mouth daily.         . Multiple Vitamins-Minerals (PRESERVISION AREDS PO)   Oral   Take 1 tablet by mouth 2 (two) times daily.          . vitamin B-12 (CYANOCOBALAMIN) 1000 MCG tablet   Oral   Take 1,000 mcg by mouth daily.         Marland Kitchen warfarin (COUMADIN) 5 MG tablet   Oral   Take 5-7.5 mg by mouth See admin instructions. Take 7.5 mg in the evening on Monday, Wednesday, and Friday ; then 5 mg in the evening on Tuesday, Thursday, Saturday and Sunday         . metoprolol (LOPRESSOR) 12.5 mg TABS tablet   Oral   Take 0.5 tablets (12.5 mg total) by mouth 2 (two) times daily.   30 tablet   0    BP  138/82  Pulse 73  Temp(Src) 98.1 F (36.7 C) (Oral)  Resp 20  SpO2 96% Physical Exam  Constitutional: He is oriented to person, place, and time. He appears well-developed and well-nourished. No distress.  HENT:  Head: Normocephalic and atraumatic.  Mouth/Throat:  Oropharynx is clear and moist. No oropharyngeal exudate.  Eyes: Conjunctivae and EOM are normal. Pupils are equal, round, and reactive to light.  Neck: Normal range of motion. Neck supple.  Cardiovascular: Normal rate, regular rhythm and normal heart sounds.   No murmur heard. Pulmonary/Chest: Effort normal and breath sounds normal. No respiratory distress.  Abdominal: Soft. There is no tenderness. There is no rebound and no guarding.  Musculoskeletal: Normal range of motion. He exhibits no edema and no tenderness.  Neurological: He is alert and oriented to person, place, and time. No cranial nerve deficit. He exhibits normal muscle tone. Coordination normal.  Skin: Skin is warm.    ED Course  Procedures (including critical care time) Labs Review Labs Reviewed  CBC WITH DIFFERENTIAL - Abnormal; Notable for the following:    Hemoglobin 12.9 (*)    RDW 15.6 (*)    All other components within normal limits  COMPREHENSIVE METABOLIC PANEL - Abnormal; Notable for the following:    GFR calc non Af Amer 64 (*)    GFR calc Af Amer 74 (*)    All other components within normal limits  PROTIME-INR - Abnormal; Notable for the following:    Prothrombin Time 23.2 (*)    INR 2.14 (*)    All other components within normal limits  PRO B NATRIURETIC PEPTIDE - Abnormal; Notable for the following:    Pro B Natriuretic peptide (BNP) 1061.0 (*)    All other components within normal limits  TROPONIN I  MAGNESIUM  TROPONIN I   Imaging Review Dg Chest 2 View  11/22/2013   CLINICAL DATA:  Shortness of breath.  EXAM: CHEST  2 VIEW  COMPARISON:  DG CHEST 2 VIEW dated 10/09/2013; CT ANGIO CHEST AORTA W/CM \\T \/OR WO/CM dated 07/24/2013   FINDINGS: Low lung volumes. Patient is status post median sternotomy, aortic valve replacement and mitral valve repair. The cardiac silhouette is enlarged. Aorta is tortuous and ectatic. Small nodules within the right lung base are once again appreciated. The larger more superior nodule appears slightly more prominent though this is likely secondary to technique particularly considering the stability of these nodules when compared to previous study. An ill-defined area of increased density projects in the left lung base slightly more prominent when compared to previous study this tube may be secondary stent technique. There is cephalization of the pulmonary vasculature is not appear to be significant peribronchial cuffing. Otherwise no focal regions of consolidation identified. The osseous structures unremarkable.  IMPRESSION: There findings which may reflect pulmonary vascular congestion without overt edema. No focal regions of consolidation appreciated. Findings within the right lung base likely accentuated due to shallow inspiration. Increased density in the left lung base likely representing atelectasis or scarring also accentuated due to technique.   Electronically Signed   By: Margaree Mackintosh M.D.   On: 11/22/2013 15:41    EKG Interpretation   None       MDM   Final diagnoses:  NSVT (nonsustained ventricular tachycardia)    Episodes of ventricular tachycardia at cardiac rehabilitation. Patient is asymptomatic. Denies chest pain or shortness of breath.  Patient has a potassium of 4, magnesium 2.1. INR is therapeutic at 2.1.  He is in no distress. Troponin is negative. Cardiology has seen the patient and he remains a symptomatically. They recommend restarting his beta blocker at a low dose 12.5 mg twice a day. He will follow up with Dr. Irish Lack for a Holter monitor.   Date: 11/22/2013  Rate: 83  Rhythm: normal sinus rhythm  QRS Axis: left  Intervals: normal  ST/T Wave abnormalities:  normal  Conduction Disutrbances:none  Narrative Interpretation: PVC  Old EKG Reviewed: unchanged    Ezequiel Essex, MD 11/22/13 2326

## 2013-11-22 NOTE — ED Notes (Signed)
Cardiology at bedside.

## 2013-11-22 NOTE — Telephone Encounter (Signed)
Cardiac rehab called that pt having runs of PVCs, he is in chronic atrial fib.  Looking back at old EKGs no ectopy is noted, he recently had low K+ at PCP.  I asked rehab to take pt to ER for labs to know if he needs further K+ management.

## 2013-11-22 NOTE — Discharge Instructions (Signed)
Cardiac Arrhythmia Follow up with Dr. Irish Lack this week. Return to the ED if you develop new or worsening symptoms. Your heart is a muscle that works to pump blood through your body by regular contractions. The beating of your heart is controlled by a system of special pacemaker cells. These cells control the electrical activity of the heart. When the system controlling this regular beating is disturbed, a heart rhythm abnormality (arrhythmia) results. WHEN YOUR HEART SKIPS A BEAT One of the most common and least serious heart arrhythmias is called an ectopic or premature atrial heartbeat (PAC). This may be noticed as a small change in your regular pulse. A PAC originates from the top part (atrium) of the heart. Within the right atrium, the SA node is the area that normally controls the regularity of the heart. PACs occur in heart tissue outside of the SA node region. You may feel this as a skipped beat or heart flutter, especially if several occur in succession or occur frequently.  Another arrhythmia is ventricular premature complex (VCP or PVC). These extra beats start out in the bottom, more muscular chambers of the heart. In most cases a PVC is harmless. If there are underlying causes that are making the heart irritable such as an overactive thyroid or a prior heart attack PVCs may be of more concern. In a few cases, medications to control the heart rhythm may be prescribed. Things to try at home:  Cut down or avoid alcohol, tobacco and caffeine.  Get enough sleep.  Reduce stress.  Exercise more. WHEN THE HEART BEATS TOO FAST Atrial tachycardia is a fast heart rate, which starts out in the atrium. It may last from minutes to much longer. Your heart may beat 140 to 240 times per minute instead of the normal 60 to 100.  Symptoms include a worried feeling (anxiety) and a sense that your heart is beating fast and hard.  You may be able to stop the fast rate by holding your breath or bearing  down as if you were going to have a bowel movement.  This type of fast rate is usually not dangerous. Atrial fibrillation and atrial flutter are other fast rhythms that start in the atria. Both conditions keep the atria from filling with enough blood so the heart does not work well.  Symptoms include feeling light-headed or faint.  These fast rates may be the result of heart damage or disease. Too much thyroid hormone may play a role.  There may be no clear cause or it may be from heart disease or damage.  Medication or a special electrical treatment (cardioversion) may be needed to get the heart beating normally. Ventricular tachycardia is a fast heart rate that starts in the lower muscular chambers (ventricles) This is a serious disorder that requires treatment as soon as possible. You need someone else to get and use a small defibrillator.  Symptoms include collapse, chest pain, or being short of breath.  Treatment may include medication, procedures to improve blood flow to the heart, or an implantable cardiac defibrillator (ICD). DIAGNOSIS   A cardiogram (EKG or ECG) will be done to see the arrhythmia, as well as lab tests to check the underlying cause.  If the extra beats or fast rate come and go, you may wear a Holter monitor that records your heart rate for a longer period of time. SEEK MEDICAL CARE IF:  You have irregular or fast heartbeats (palpitations).  You experience skipped beats.  You develop lightheadedness.  You have chest discomfort.  You have shortness of breath.  You have more frequent episodes, if you are already being treated. SEEK IMMEDIATE MEDICAL CARE IF:   You have severe chest pain, especially if the pain is crushing or pressure-like and spreads to the arms, back, neck, or jaw, or if you have sweating, feeling sick to your stomach (nausea), or shortness of breath. THIS IS AN EMERGENCY. Do not wait to see if the pain will go away. Get medical help at  once. Call 911 or 0 (operator). DO NOT drive yourself to the hospital.  You feel dizzy or faint.  You have episodes of previously documented atrial tachycardia that do not resolve with the techniques your caregiver has taught you.  Irregular or rapid heartbeats begin to occur more often than in the past, especially if they are associated with more pronounced symptoms or of longer duration. Document Released: 09/28/2005 Document Revised: 12/21/2011 Document Reviewed: 05/16/2008 [facility] Patient Information 2014 [place].

## 2013-11-23 NOTE — Telephone Encounter (Signed)
K+ and troponins were normal.  EF normal in December after valve surgery. Continue with exercise  but if patient starts to have sx or runs of PVCs, would slow exercise. Will need to review strips.

## 2013-11-24 ENCOUNTER — Ambulatory Visit (INDEPENDENT_AMBULATORY_CARE_PROVIDER_SITE_OTHER): Payer: Medicare Other | Admitting: Pharmacist

## 2013-11-24 ENCOUNTER — Encounter (HOSPITAL_COMMUNITY): Admission: RE | Admit: 2013-11-24 | Payer: Medicare Other | Source: Ambulatory Visit

## 2013-11-24 ENCOUNTER — Encounter: Payer: Self-pay | Admitting: Interventional Cardiology

## 2013-11-24 ENCOUNTER — Encounter (HOSPITAL_COMMUNITY): Payer: Medicare Other

## 2013-11-24 DIAGNOSIS — I4891 Unspecified atrial fibrillation: Secondary | ICD-10-CM

## 2013-11-24 LAB — POCT INR: INR: 2.5

## 2013-11-27 ENCOUNTER — Encounter (HOSPITAL_COMMUNITY): Payer: Medicare Other

## 2013-11-29 ENCOUNTER — Encounter (HOSPITAL_COMMUNITY): Admission: RE | Admit: 2013-11-29 | Payer: Medicare Other | Source: Ambulatory Visit

## 2013-11-29 ENCOUNTER — Encounter (HOSPITAL_COMMUNITY): Payer: Medicare Other

## 2013-12-01 ENCOUNTER — Encounter (HOSPITAL_COMMUNITY): Payer: Medicare Other

## 2013-12-04 ENCOUNTER — Encounter (HOSPITAL_COMMUNITY): Payer: Medicare Other

## 2013-12-06 ENCOUNTER — Encounter (HOSPITAL_COMMUNITY): Payer: Medicare Other

## 2013-12-06 ENCOUNTER — Ambulatory Visit (INDEPENDENT_AMBULATORY_CARE_PROVIDER_SITE_OTHER): Payer: Medicare Other | Admitting: Interventional Cardiology

## 2013-12-06 ENCOUNTER — Encounter: Payer: Self-pay | Admitting: Interventional Cardiology

## 2013-12-06 VITALS — BP 128/82 | HR 80 | Ht 68.0 in | Wt 173.0 lb

## 2013-12-06 DIAGNOSIS — I10 Essential (primary) hypertension: Secondary | ICD-10-CM

## 2013-12-06 DIAGNOSIS — Z952 Presence of prosthetic heart valve: Secondary | ICD-10-CM | POA: Diagnosis not present

## 2013-12-06 DIAGNOSIS — I4891 Unspecified atrial fibrillation: Secondary | ICD-10-CM | POA: Diagnosis not present

## 2013-12-06 DIAGNOSIS — I4819 Other persistent atrial fibrillation: Secondary | ICD-10-CM

## 2013-12-06 DIAGNOSIS — Z953 Presence of xenogenic heart valve: Secondary | ICD-10-CM

## 2013-12-06 MED ORDER — METOPROLOL TARTRATE 25 MG PO TABS: 12.5000 mg | ORAL_TABLET | Freq: Two times a day (BID) | ORAL | Status: DC

## 2013-12-06 NOTE — Patient Instructions (Signed)
Your physician recommends that you continue on your current medications as directed. Please refer to the Current Medication list given to you today.  Your physician wants you to follow-up in: 6 months with Dr. Varanasi.  You will receive a reminder letter in the mail two months in advance. If you don't receive a letter, please call our office to schedule the follow-up appointment.  

## 2013-12-06 NOTE — Progress Notes (Signed)
Patient ID: Jason [name], male   DOB: [birth year], 80 y.o.   MRN: [id number] Patient ID: Jason [name], male   DOB: 05-23-1932, 80 y.o.   MRN: [id number]    [place], [address] [place], [place]  [address] Phone: [phone] Fax:  [phone]  Date:  12/06/2013   ID:  Jason [name], DOB [birth year], MRN [id number]  PCP:  Shirline Frees, MD      History of Present Illness: Jason [name] is a 80 y.o. male who has had valvular heart disease and atrial fibrillation. He underwent aortic valve and mitral valve replacement in 11/14. He has been doing well.  He had some swelling in his legs.  He was initially supposed to have a Maze procedure along with his valve surgery. Do to scar tissue from his initial surgery 9 years ago, he did not have a maze done. He has been rate controlled. He denies any palpitations. His only chest pain is if he coughs. He does not have pain at any other time. His breathing is okay. He has resumed walking. He is doing cardiac rehabilitation.   Wt Readings from Last 3 Encounters:  12/06/13 173 lb (78.472 kg)  10/09/13 167 lb (75.751 kg)  09/28/13 167 lb 12.3 oz (76.1 kg)     Past Medical History  Diagnosis Date  . Hyperlipidemia   . Hypertension   . Heart murmur   . Hydrocele     left  . Kidney stones   . Internal hemorrhoids   . Vocal cord paralysis   . Macular degeneration     right eye  . Blindness     left eye  . OA (osteoarthritis) of neck   . Elevated PSA   . AK (actinic keratosis)   . Severe aortic stenosis 11/14    tissue AVR   . S/P mitral valve repair 09/15/2004    Complex valvuloplasty including artificial Goretex neocord placement x4 and 47mm SARP ring annuloplasty via right mini thoracotomy approach - Dr Evelina Dun @ [facility]  . Mitral regurgitation 02/25/2011    Recurrent MR s/p mitral valve repair   . Atrial fibrillation, persistent     chronic coumadin therapy  . GERD (gastroesophageal reflux disease)   . H/O hiatal hernia   . S/P  redo mitral valve replacement and aortic valve replacement with bioprosthetic valves 08/15/2013    5mm Edwards Magna Mitral bovine pericardial tissue valve  . S/P aortic valve replacement with bioprosthetic valve 08/15/2013    47mm Edwards Magna Ease bovine pericardial tissue valve    Current Outpatient Prescriptions  Medication Sig Dispense Refill  . acetaminophen (TYLENOL) 650 MG CR tablet Take 1,300 mg by mouth 2 (two) times daily.       Marland Kitchen atorvastatin (LIPITOR) 20 MG tablet Take 20 mg by mouth at bedtime.       Marland Kitchen b complex vitamins tablet Take 1 tablet by mouth daily.      Marland Kitchen CARTIA XT 240 MG 24 hr capsule Take 240 mg by mouth every evening.       . furosemide (LASIX) 40 MG tablet Take 40 mg by mouth 2 (two) times daily.      Marland Kitchen glucosamine-chondroitin 500-400 MG tablet Take 1 tablet by mouth 2 (two) times daily.       Marland Kitchen lisinopril (PRINIVIL,ZESTRIL) 10 MG tablet Take 10 mg by mouth daily.      . metoprolol tartrate (LOPRESSOR) 25 MG tablet Take 12.5 mg by mouth 2 (two)  times daily.      . Multiple Vitamin (MULTIVITAMIN) capsule Take 1 capsule by mouth daily.      . Multiple Vitamins-Minerals (PRESERVISION AREDS PO) Take 1 tablet by mouth 2 (two) times daily.       . vitamin B-12 (CYANOCOBALAMIN) 1000 MCG tablet Take 1,000 mcg by mouth daily.      Marland Kitchen warfarin (COUMADIN) 5 MG tablet Take 5-7.5 mg by mouth See admin instructions. Take 7.5 mg in the evening on Monday, Wednesday, and Friday ; then 5 mg in the evening on Tuesday, Thursday, Saturday and Sunday       No current facility-administered medications for this visit.    Allergies:    Allergies  Allergen Reactions  . Penicillins Rash    Social History:  The patient  reports that he quit smoking about 35 years ago. His smoking use included Cigarettes. He smoked 0.00 packs per day for 8 years. He has never used smokeless tobacco. He reports that he does not drink alcohol or use illicit drugs.   Family History:  The patient's family  history includes Cancer in his father; Diabetes in his father and mother; Heart attack in his mother; Hyperlipidemia in his father and mother; Hypertension in his brother, father, and mother; Stroke in his father and mother.   ROS:  Please see the history of present illness.  No nausea, vomiting.  No fevers, chills.  No focal weakness.  No dysuria.    All other systems reviewed and negative.   PHYSICAL EXAM: VS:  BP 128/82  Pulse 80  Ht 5\' 8"  (1.727 m)  Wt 173 lb (78.472 kg)  BMI 26.31 kg/m2 Well nourished, well developed, in no acute distress HEENT: normal Neck: no JVD, no carotid bruits Cardiac:  normal S1, S2; RRR; 2/6 systolic murmur , significantly less in intensity compared to pre-surgery Lungs:  clear to auscultation bilaterally, no wheezing, rhonchi or rales Abd: soft, nontender, no hepatomegaly Ext: no edema Skin: warm and dry Neuro:   no focal abnormalities noted   ASSESSMENT AND PLAN:  1. Atrial fibrillation  Notes: rate controlled. He has not tolerated higher doses of rate controlling medicines in the past. He is tolerating low dose beta blocker.  COntinue metoprolol 12.5 mg PO BID.   I reviewed his strips from cardiac rehab.  He felt he was doing more than his usual workload.  I think he had AFib with aberrancy.  His AFib is chronic.  No h/o CAD.  Continue beta blocker.  If sx arise, he will let us know.  Ok to return to cardiac rehab.   2. Shortness of breath  Notes: Mild. Better with increased diuretics. Likely fluid overload postoperatively  3. Mitral Regurgitation  Notes: Status post bioprosthetic mitral valve placement in November 2014.  4. Acquired stenosis of aortic valve  Notes: Status post bioprosthetic aortic valve replacement in November 2014.  5. edema: Continue increased dose of Lasix. This is improved. This was likely related to postop fluid retention. BNP was elevated in 2/15 in ER.  Echocardiogram to get new baseline after valve surgery showed EF  50%.Weston Brass, Mina Marble, MD, [facility] 12/06/2013 11:59 AM

## 2013-12-08 ENCOUNTER — Encounter (HOSPITAL_COMMUNITY)
Admission: RE | Admit: 2013-12-08 | Discharge: 2013-12-08 | Disposition: A | Discharge status: Home / Self Care | Payer: Medicare Other | Source: Ambulatory Visit | Attending: Interventional Cardiology | Admitting: Interventional Cardiology

## 2013-12-08 ENCOUNTER — Encounter (HOSPITAL_COMMUNITY): Payer: Medicare Other

## 2013-12-08 NOTE — Progress Notes (Signed)
Pt returned to cardiac rehab today.  Pt feels well, tolerated activity without difficulty.  Asymptomatic. Telemetry-a fib. No ventricular ectopy noted.   Pt reports he started metoprolol 25mg  (1/2 tab) BID per Dr. Irish Lack.  Will continue to monitor

## 2013-12-11 ENCOUNTER — Encounter (HOSPITAL_COMMUNITY): Payer: Medicare Other

## 2013-12-11 ENCOUNTER — Encounter (HOSPITAL_COMMUNITY)
Admission: RE | Admit: 2013-12-11 | Discharge: 2013-12-11 | Disposition: A | Discharge status: Home / Self Care | Payer: Medicare Other | Source: Ambulatory Visit | Attending: Interventional Cardiology | Admitting: Interventional Cardiology

## 2013-12-11 DIAGNOSIS — Z8249 Family history of ischemic heart disease and other diseases of the circulatory system: Secondary | ICD-10-CM | POA: Diagnosis not present

## 2013-12-11 DIAGNOSIS — Z5189 Encounter for other specified aftercare: Secondary | ICD-10-CM | POA: Insufficient documentation

## 2013-12-11 DIAGNOSIS — I700 Atherosclerosis of aorta: Secondary | ICD-10-CM | POA: Insufficient documentation

## 2013-12-11 DIAGNOSIS — Z7982 Long term (current) use of aspirin: Secondary | ICD-10-CM | POA: Diagnosis not present

## 2013-12-11 DIAGNOSIS — Z79899 Other long term (current) drug therapy: Secondary | ICD-10-CM | POA: Diagnosis not present

## 2013-12-11 DIAGNOSIS — Z954 Presence of other heart-valve replacement: Secondary | ICD-10-CM | POA: Insufficient documentation

## 2013-12-11 DIAGNOSIS — I079 Rheumatic tricuspid valve disease, unspecified: Secondary | ICD-10-CM | POA: Diagnosis not present

## 2013-12-11 DIAGNOSIS — Z87891 Personal history of nicotine dependence: Secondary | ICD-10-CM | POA: Insufficient documentation

## 2013-12-11 DIAGNOSIS — Z7901 Long term (current) use of anticoagulants: Secondary | ICD-10-CM | POA: Insufficient documentation

## 2013-12-11 DIAGNOSIS — J9819 Other pulmonary collapse: Secondary | ICD-10-CM | POA: Diagnosis not present

## 2013-12-11 DIAGNOSIS — I4891 Unspecified atrial fibrillation: Secondary | ICD-10-CM | POA: Insufficient documentation

## 2013-12-11 DIAGNOSIS — I2789 Other specified pulmonary heart diseases: Secondary | ICD-10-CM | POA: Diagnosis not present

## 2013-12-11 DIAGNOSIS — I498 Other specified cardiac arrhythmias: Secondary | ICD-10-CM | POA: Insufficient documentation

## 2013-12-11 DIAGNOSIS — I10 Essential (primary) hypertension: Secondary | ICD-10-CM | POA: Insufficient documentation

## 2013-12-11 DIAGNOSIS — I080 Rheumatic disorders of both mitral and aortic valves: Secondary | ICD-10-CM | POA: Insufficient documentation

## 2013-12-13 ENCOUNTER — Encounter (HOSPITAL_COMMUNITY): Payer: Medicare Other

## 2013-12-13 ENCOUNTER — Encounter (HOSPITAL_COMMUNITY)
Admission: RE | Admit: 2013-12-13 | Discharge: 2013-12-13 | Disposition: A | Discharge status: Home / Self Care | Payer: Medicare Other | Source: Ambulatory Visit | Attending: Interventional Cardiology | Admitting: Interventional Cardiology

## 2013-12-15 ENCOUNTER — Encounter (HOSPITAL_COMMUNITY): Payer: Medicare Other

## 2013-12-15 ENCOUNTER — Encounter (HOSPITAL_COMMUNITY)
Admission: RE | Admit: 2013-12-15 | Discharge: 2013-12-15 | Disposition: A | Discharge status: Home / Self Care | Payer: Medicare Other | Source: Ambulatory Visit | Attending: Interventional Cardiology | Admitting: Interventional Cardiology

## 2013-12-18 ENCOUNTER — Encounter (HOSPITAL_COMMUNITY): Payer: Medicare Other

## 2013-12-18 ENCOUNTER — Encounter (HOSPITAL_COMMUNITY)
Admission: RE | Admit: 2013-12-18 | Discharge: 2013-12-18 | Disposition: A | Discharge status: Home / Self Care | Payer: Medicare Other | Source: Ambulatory Visit | Attending: Interventional Cardiology | Admitting: Interventional Cardiology

## 2013-12-20 ENCOUNTER — Encounter (HOSPITAL_COMMUNITY)
Admission: RE | Admit: 2013-12-20 | Discharge: 2013-12-20 | Disposition: A | Discharge status: Home / Self Care | Payer: Medicare Other | Source: Ambulatory Visit | Attending: Interventional Cardiology | Admitting: Interventional Cardiology

## 2013-12-20 ENCOUNTER — Encounter (HOSPITAL_COMMUNITY): Payer: Medicare Other

## 2013-12-21 ENCOUNTER — Ambulatory Visit (INDEPENDENT_AMBULATORY_CARE_PROVIDER_SITE_OTHER): Payer: Medicare Other | Admitting: Pharmacist Clinician (PhC)/ Clinical Pharmacy Specialist

## 2013-12-21 DIAGNOSIS — I4891 Unspecified atrial fibrillation: Secondary | ICD-10-CM | POA: Diagnosis not present

## 2013-12-21 LAB — POCT INR: INR: 2.2

## 2013-12-22 ENCOUNTER — Encounter (HOSPITAL_COMMUNITY): Payer: Medicare Other

## 2013-12-25 ENCOUNTER — Encounter (HOSPITAL_COMMUNITY): Payer: Medicare Other

## 2013-12-25 ENCOUNTER — Encounter (HOSPITAL_COMMUNITY)
Admission: RE | Admit: 2013-12-25 | Discharge: 2013-12-25 | Disposition: A | Discharge status: Home / Self Care | Payer: Medicare Other | Source: Ambulatory Visit | Attending: Interventional Cardiology | Admitting: Interventional Cardiology

## 2013-12-27 ENCOUNTER — Encounter (HOSPITAL_COMMUNITY)
Admission: RE | Admit: 2013-12-27 | Discharge: 2013-12-27 | Disposition: A | Discharge status: Home / Self Care | Payer: Medicare Other | Source: Ambulatory Visit | Attending: Interventional Cardiology | Admitting: Interventional Cardiology

## 2013-12-27 ENCOUNTER — Encounter (HOSPITAL_COMMUNITY): Payer: Medicare Other

## 2013-12-29 ENCOUNTER — Encounter (HOSPITAL_COMMUNITY): Payer: Medicare Other

## 2013-12-29 ENCOUNTER — Encounter (HOSPITAL_COMMUNITY)
Admission: RE | Admit: 2013-12-29 | Discharge: 2013-12-29 | Disposition: A | Discharge status: Home / Self Care | Payer: Medicare Other | Source: Ambulatory Visit | Attending: Interventional Cardiology | Admitting: Interventional Cardiology

## 2014-01-01 ENCOUNTER — Encounter (HOSPITAL_COMMUNITY)
Admission: RE | Admit: 2014-01-01 | Discharge: 2014-01-01 | Disposition: A | Discharge status: Home / Self Care | Payer: Medicare Other | Source: Ambulatory Visit | Attending: Interventional Cardiology | Admitting: Interventional Cardiology

## 2014-01-01 ENCOUNTER — Encounter (HOSPITAL_COMMUNITY): Payer: Medicare Other

## 2014-01-01 DIAGNOSIS — H353290 Exudative age-related macular degeneration, unspecified eye, stage unspecified: Secondary | ICD-10-CM | POA: Diagnosis not present

## 2014-01-01 DIAGNOSIS — H35059 Retinal neovascularization, unspecified, unspecified eye: Secondary | ICD-10-CM | POA: Diagnosis not present

## 2014-01-03 ENCOUNTER — Encounter (HOSPITAL_COMMUNITY)
Admission: RE | Admit: 2014-01-03 | Discharge: 2014-01-03 | Disposition: A | Discharge status: Home / Self Care | Payer: Medicare Other | Source: Ambulatory Visit | Attending: Interventional Cardiology | Admitting: Interventional Cardiology

## 2014-01-03 ENCOUNTER — Encounter (HOSPITAL_COMMUNITY): Payer: Medicare Other

## 2014-01-05 ENCOUNTER — Encounter (HOSPITAL_COMMUNITY): Payer: Medicare Other

## 2014-01-05 ENCOUNTER — Encounter (HOSPITAL_COMMUNITY)
Admission: RE | Admit: 2014-01-05 | Discharge: 2014-01-05 | Disposition: A | Discharge status: Home / Self Care | Payer: Medicare Other | Source: Ambulatory Visit | Attending: Interventional Cardiology | Admitting: Interventional Cardiology

## 2014-01-08 ENCOUNTER — Encounter: Payer: Self-pay | Admitting: Thoracic Surgery (Cardiothoracic Vascular Surgery)

## 2014-01-08 ENCOUNTER — Encounter (HOSPITAL_COMMUNITY)
Admission: RE | Admit: 2014-01-08 | Discharge: 2014-01-08 | Disposition: A | Discharge status: Home / Self Care | Payer: Medicare Other | Source: Ambulatory Visit | Attending: Interventional Cardiology | Admitting: Interventional Cardiology

## 2014-01-08 ENCOUNTER — Encounter: Payer: Self-pay | Admitting: Interventional Cardiology

## 2014-01-08 ENCOUNTER — Ambulatory Visit (INDEPENDENT_AMBULATORY_CARE_PROVIDER_SITE_OTHER): Payer: Medicare Other | Admitting: Thoracic Surgery (Cardiothoracic Vascular Surgery)

## 2014-01-08 VITALS — BP 135/88 | HR 77 | Resp 16 | Ht 68.0 in | Wt 173.0 lb

## 2014-01-08 DIAGNOSIS — I359 Nonrheumatic aortic valve disorder, unspecified: Secondary | ICD-10-CM

## 2014-01-08 DIAGNOSIS — Z952 Presence of prosthetic heart valve: Secondary | ICD-10-CM | POA: Insufficient documentation

## 2014-01-08 DIAGNOSIS — I340 Nonrheumatic mitral (valve) insufficiency: Secondary | ICD-10-CM

## 2014-01-08 DIAGNOSIS — I350 Nonrheumatic aortic (valve) stenosis: Secondary | ICD-10-CM

## 2014-01-08 DIAGNOSIS — I059 Rheumatic mitral valve disease, unspecified: Secondary | ICD-10-CM

## 2014-01-08 DIAGNOSIS — Z954 Presence of other heart-valve replacement: Secondary | ICD-10-CM

## 2014-01-08 NOTE — Progress Notes (Signed)
      [address]       ,[place] [address]             [phone]     CARDIOTHORACIC SURGERY OFFICE NOTE  Referring Provider is Casandra Doffing, MD PCP is Shirline Frees, MD   HPI:  Patient returns for routine followup status post aortic valve replacement and redo mitral valve replacement on 08/15/2013, both using a bioprosthetic tissue valves. His postoperative recovery has been uncomplicated and he was last seen here in our office on 10/09/2013.  Since then he has done remarkably well. He has now completed the outpatient cardiac rehabilitation program. He states that his exercise tolerance is considerably better than it was prior to surgery. He did not experience any exertional shortness of breath, even with relatively strenuous activity. He has no residual pain and overall he feels quite well.    Current Outpatient Prescriptions  Medication Sig Dispense Refill  . acetaminophen (TYLENOL) 650 MG CR tablet Take 1,300 mg by mouth 2 (two) times daily.       Marland Kitchen atorvastatin (LIPITOR) 20 MG tablet Take 20 mg by mouth at bedtime.       Marland Kitchen b complex vitamins tablet Take 1 tablet by mouth daily.      Marland Kitchen CARTIA XT 240 MG 24 hr capsule Take 240 mg by mouth every evening.       . furosemide (LASIX) 40 MG tablet Take 40 mg by mouth 2 (two) times daily.      Marland Kitchen glucosamine-chondroitin 500-400 MG tablet Take 1 tablet by mouth 2 (two) times daily.       Marland Kitchen lisinopril (PRINIVIL,ZESTRIL) 10 MG tablet Take 10 mg by mouth daily.      . metoprolol tartrate (LOPRESSOR) 25 MG tablet Take 0.5 tablets (12.5 mg total) by mouth 2 (two) times daily.  30 tablet  11  . Multiple Vitamin (MULTIVITAMIN) capsule Take 1 capsule by mouth daily.      . Multiple Vitamins-Minerals (PRESERVISION AREDS PO) Take 1 tablet by mouth 2 (two) times daily.       . vitamin B-12 (CYANOCOBALAMIN) 1000 MCG tablet Take 1,000 mcg by mouth daily.      Marland Kitchen warfarin (COUMADIN) 5 MG tablet Take 5-7.5 mg by mouth See admin  instructions. Take 7.5 mg in the evening on Monday, Wednesday, and Friday ; then 5 mg in the evening on Tuesday, Thursday, Saturday and Sunday       No current facility-administered medications for this visit.      Physical Exam:   BP 135/88  Pulse 77  Resp 16  Ht 5\' 8"  (1.727 m)  Wt 173 lb (78.472 kg)  BMI 26.31 kg/m2  SpO2 97%  General:  Well-appearing  Chest:   Clear to auscultation  CV:   Irregular rate and rhythm without murmur  Incisions:  Completely healed, sternum is stable  Abdomen:  Soft and nontender  Extremities:  Warm and well-perfused  Diagnostic Tests:  n/a   Impression:  Patient is doing very well 4 months status post redo mitral valve replacement and aortic valve replacement.  Plan:  The patient will return for followup next November for routine one year followup visit.   Valentina Gu. Roxy Manns, MD 01/08/2014 12:18 PM

## 2014-01-08 NOTE — Patient Instructions (Signed)
Patient may resume normal physical activity without any particular limitations at this time, and return to our office only as needed should any further problems or questions arise.  

## 2014-01-10 ENCOUNTER — Encounter (HOSPITAL_COMMUNITY)
Admission: RE | Admit: 2014-01-10 | Discharge: 2014-01-10 | Disposition: A | Discharge status: Home / Self Care | Payer: Medicare Other | Source: Ambulatory Visit | Attending: Interventional Cardiology | Admitting: Interventional Cardiology

## 2014-01-10 DIAGNOSIS — J9819 Other pulmonary collapse: Secondary | ICD-10-CM | POA: Insufficient documentation

## 2014-01-10 DIAGNOSIS — I4891 Unspecified atrial fibrillation: Secondary | ICD-10-CM | POA: Diagnosis not present

## 2014-01-10 DIAGNOSIS — I498 Other specified cardiac arrhythmias: Secondary | ICD-10-CM | POA: Insufficient documentation

## 2014-01-10 DIAGNOSIS — I080 Rheumatic disorders of both mitral and aortic valves: Secondary | ICD-10-CM | POA: Insufficient documentation

## 2014-01-10 DIAGNOSIS — I10 Essential (primary) hypertension: Secondary | ICD-10-CM | POA: Insufficient documentation

## 2014-01-10 DIAGNOSIS — Z5189 Encounter for other specified aftercare: Secondary | ICD-10-CM | POA: Insufficient documentation

## 2014-01-10 DIAGNOSIS — Z8249 Family history of ischemic heart disease and other diseases of the circulatory system: Secondary | ICD-10-CM | POA: Insufficient documentation

## 2014-01-10 DIAGNOSIS — Z7982 Long term (current) use of aspirin: Secondary | ICD-10-CM | POA: Insufficient documentation

## 2014-01-10 DIAGNOSIS — Z79899 Other long term (current) drug therapy: Secondary | ICD-10-CM | POA: Insufficient documentation

## 2014-01-10 DIAGNOSIS — Z954 Presence of other heart-valve replacement: Secondary | ICD-10-CM | POA: Insufficient documentation

## 2014-01-10 DIAGNOSIS — Z87891 Personal history of nicotine dependence: Secondary | ICD-10-CM | POA: Insufficient documentation

## 2014-01-10 DIAGNOSIS — Z7901 Long term (current) use of anticoagulants: Secondary | ICD-10-CM | POA: Insufficient documentation

## 2014-01-10 DIAGNOSIS — I700 Atherosclerosis of aorta: Secondary | ICD-10-CM | POA: Diagnosis not present

## 2014-01-10 DIAGNOSIS — I2789 Other specified pulmonary heart diseases: Secondary | ICD-10-CM | POA: Diagnosis not present

## 2014-01-10 DIAGNOSIS — I079 Rheumatic tricuspid valve disease, unspecified: Secondary | ICD-10-CM | POA: Insufficient documentation

## 2014-01-12 ENCOUNTER — Encounter (HOSPITAL_COMMUNITY)
Admission: RE | Admit: 2014-01-12 | Discharge: 2014-01-12 | Disposition: A | Discharge status: Home / Self Care | Payer: Medicare Other | Source: Ambulatory Visit | Attending: Interventional Cardiology | Admitting: Interventional Cardiology

## 2014-01-12 ENCOUNTER — Encounter (HOSPITAL_COMMUNITY): Payer: Self-pay

## 2014-01-12 NOTE — Progress Notes (Signed)
Pt graduated from cardiac rehab program today.  Medication list reconciled.  PHQ9 score- 0.  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  Pt handles this stress well .  Pt has made significant lifestyle changes and should be commended for his success. Pt plans to continue exercise on his own by walking and playing golf.

## 2014-01-15 ENCOUNTER — Encounter (HOSPITAL_COMMUNITY): Payer: Medicare Other

## 2014-01-17 ENCOUNTER — Encounter (HOSPITAL_COMMUNITY): Payer: Medicare Other

## 2014-01-18 ENCOUNTER — Ambulatory Visit (INDEPENDENT_AMBULATORY_CARE_PROVIDER_SITE_OTHER): Payer: Medicare Other | Admitting: Pharmacist Clinician (PhC)/ Clinical Pharmacy Specialist

## 2014-01-18 DIAGNOSIS — I4891 Unspecified atrial fibrillation: Secondary | ICD-10-CM | POA: Diagnosis not present

## 2014-01-18 LAB — POCT INR: INR: 2.4

## 2014-01-19 ENCOUNTER — Encounter (HOSPITAL_COMMUNITY): Payer: Medicare Other

## 2014-01-22 ENCOUNTER — Encounter (HOSPITAL_COMMUNITY): Payer: Medicare Other

## 2014-01-24 ENCOUNTER — Encounter (HOSPITAL_COMMUNITY): Payer: Medicare Other

## 2014-01-26 ENCOUNTER — Encounter (HOSPITAL_COMMUNITY): Payer: Medicare Other

## 2014-01-29 ENCOUNTER — Encounter (HOSPITAL_COMMUNITY): Payer: Medicare Other

## 2014-01-31 ENCOUNTER — Encounter (HOSPITAL_COMMUNITY): Payer: Medicare Other

## 2014-02-02 ENCOUNTER — Encounter (HOSPITAL_COMMUNITY): Payer: Medicare Other

## 2014-02-05 DIAGNOSIS — H35059 Retinal neovascularization, unspecified, unspecified eye: Secondary | ICD-10-CM | POA: Diagnosis not present

## 2014-02-05 DIAGNOSIS — H353290 Exudative age-related macular degeneration, unspecified eye, stage unspecified: Secondary | ICD-10-CM | POA: Diagnosis not present

## 2014-02-28 ENCOUNTER — Ambulatory Visit (INDEPENDENT_AMBULATORY_CARE_PROVIDER_SITE_OTHER): Payer: Medicare Other | Admitting: Pharmacist

## 2014-02-28 DIAGNOSIS — I4891 Unspecified atrial fibrillation: Secondary | ICD-10-CM

## 2014-02-28 LAB — POCT INR: INR: 2.7

## 2014-03-07 DIAGNOSIS — H35059 Retinal neovascularization, unspecified, unspecified eye: Secondary | ICD-10-CM | POA: Diagnosis not present

## 2014-03-07 DIAGNOSIS — H353290 Exudative age-related macular degeneration, unspecified eye, stage unspecified: Secondary | ICD-10-CM | POA: Diagnosis not present

## 2014-04-09 ENCOUNTER — Other Ambulatory Visit: Payer: Self-pay | Admitting: Interventional Cardiology

## 2014-04-10 ENCOUNTER — Other Ambulatory Visit: Payer: Self-pay | Admitting: Interventional Cardiology

## 2014-04-11 ENCOUNTER — Ambulatory Visit (INDEPENDENT_AMBULATORY_CARE_PROVIDER_SITE_OTHER): Payer: Medicare Other | Admitting: *Deleted

## 2014-04-11 DIAGNOSIS — I4891 Unspecified atrial fibrillation: Secondary | ICD-10-CM

## 2014-04-11 LAB — POCT INR: INR: 2.5

## 2014-04-16 DIAGNOSIS — H35059 Retinal neovascularization, unspecified, unspecified eye: Secondary | ICD-10-CM | POA: Diagnosis not present

## 2014-04-16 DIAGNOSIS — H353290 Exudative age-related macular degeneration, unspecified eye, stage unspecified: Secondary | ICD-10-CM | POA: Diagnosis not present

## 2014-05-08 DIAGNOSIS — I10 Essential (primary) hypertension: Secondary | ICD-10-CM | POA: Diagnosis not present

## 2014-05-08 DIAGNOSIS — E785 Hyperlipidemia, unspecified: Secondary | ICD-10-CM | POA: Diagnosis not present

## 2014-05-08 DIAGNOSIS — I4891 Unspecified atrial fibrillation: Secondary | ICD-10-CM | POA: Diagnosis not present

## 2014-05-10 DIAGNOSIS — I10 Essential (primary) hypertension: Secondary | ICD-10-CM | POA: Diagnosis not present

## 2014-05-10 DIAGNOSIS — E785 Hyperlipidemia, unspecified: Secondary | ICD-10-CM | POA: Diagnosis not present

## 2014-05-21 DIAGNOSIS — H353290 Exudative age-related macular degeneration, unspecified eye, stage unspecified: Secondary | ICD-10-CM | POA: Diagnosis not present

## 2014-05-21 DIAGNOSIS — H35059 Retinal neovascularization, unspecified, unspecified eye: Secondary | ICD-10-CM | POA: Diagnosis not present

## 2014-05-23 ENCOUNTER — Ambulatory Visit (INDEPENDENT_AMBULATORY_CARE_PROVIDER_SITE_OTHER): Payer: Medicare Other | Admitting: *Deleted

## 2014-05-23 DIAGNOSIS — I4891 Unspecified atrial fibrillation: Secondary | ICD-10-CM | POA: Diagnosis not present

## 2014-05-23 LAB — POCT INR: INR: 2.7

## 2014-06-10 IMAGING — CR DG CHEST 2V
2 series · 2 of 2 positions shown · non-contrast
Comparison: 07/24/13

CLINICAL DATA: Preop for cardiac surgery

EXAM:
CHEST  2 VIEW

[w chest pa]
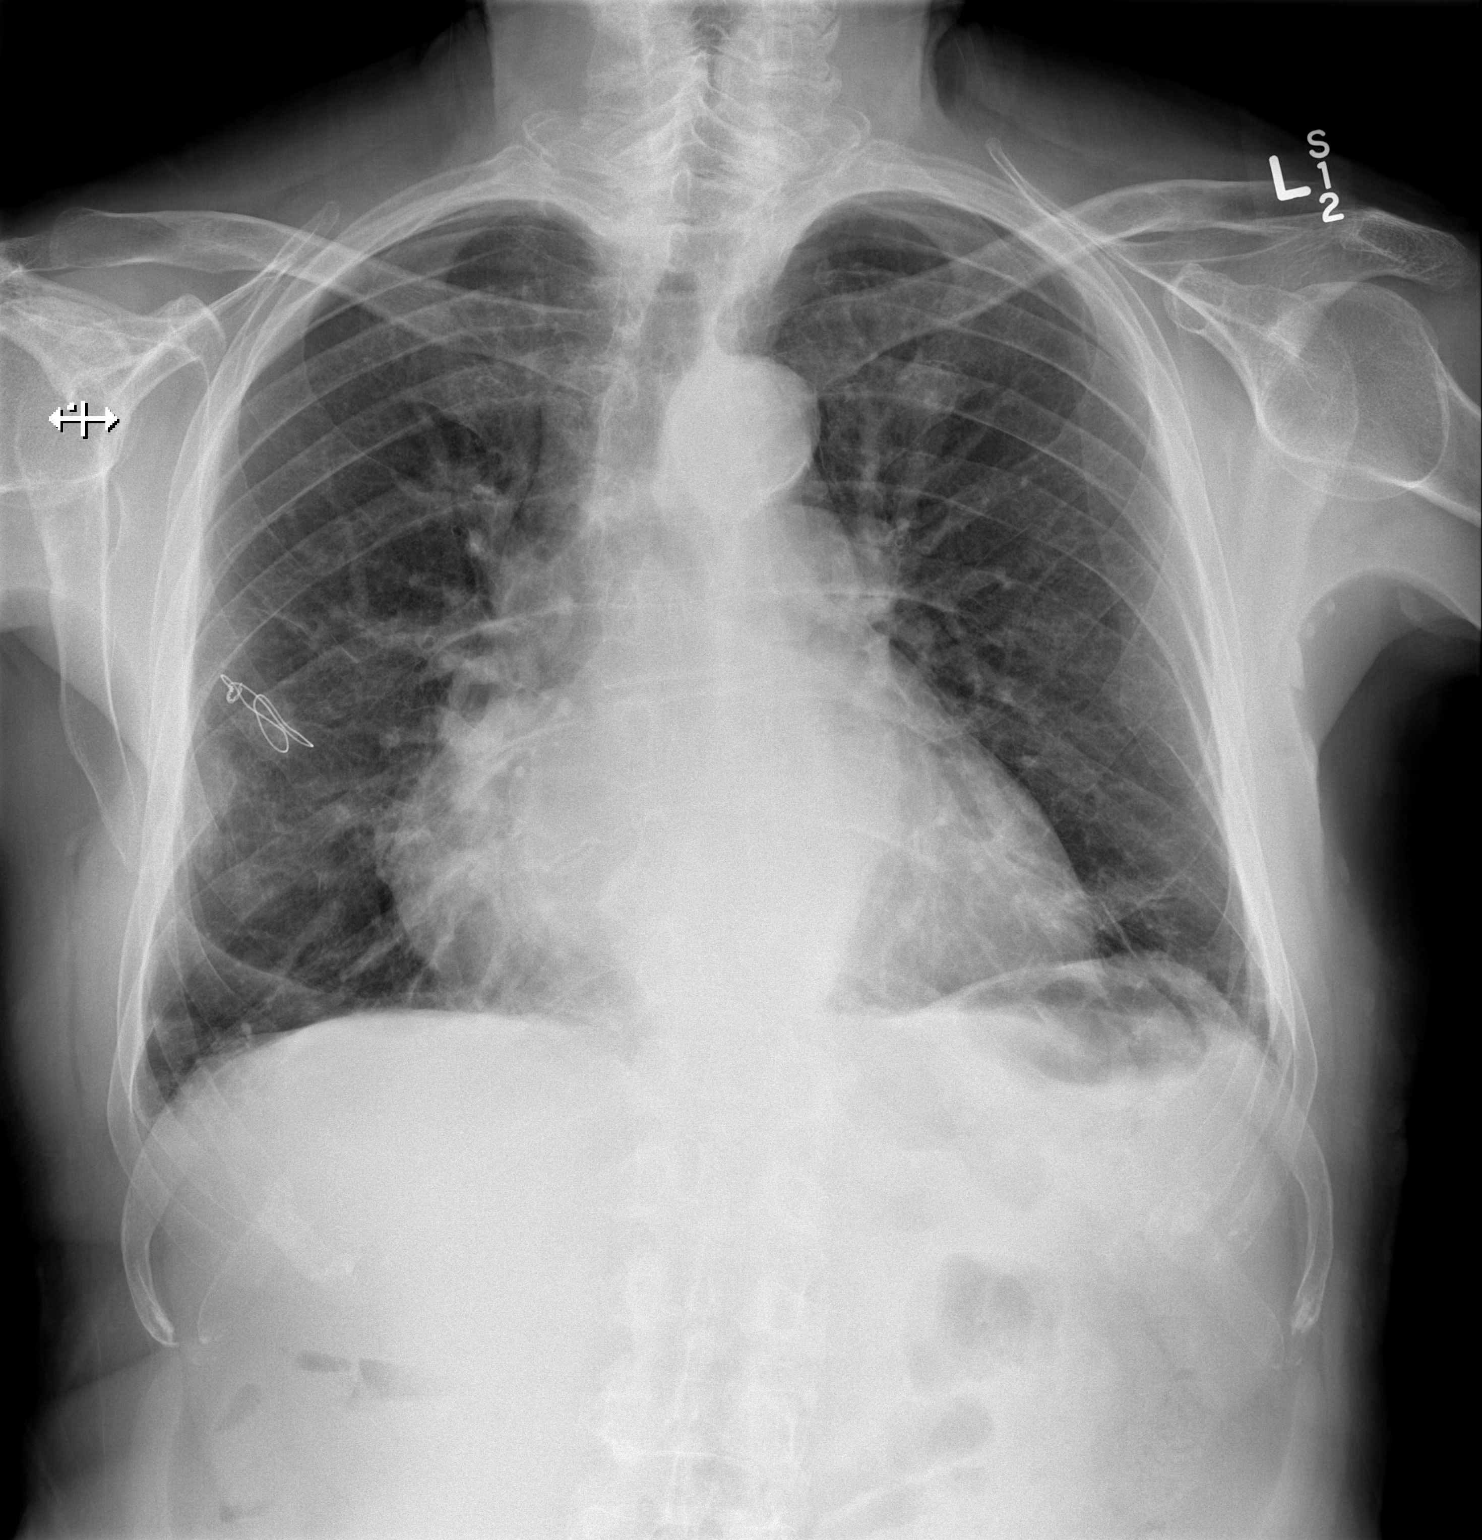

[w chest lat]
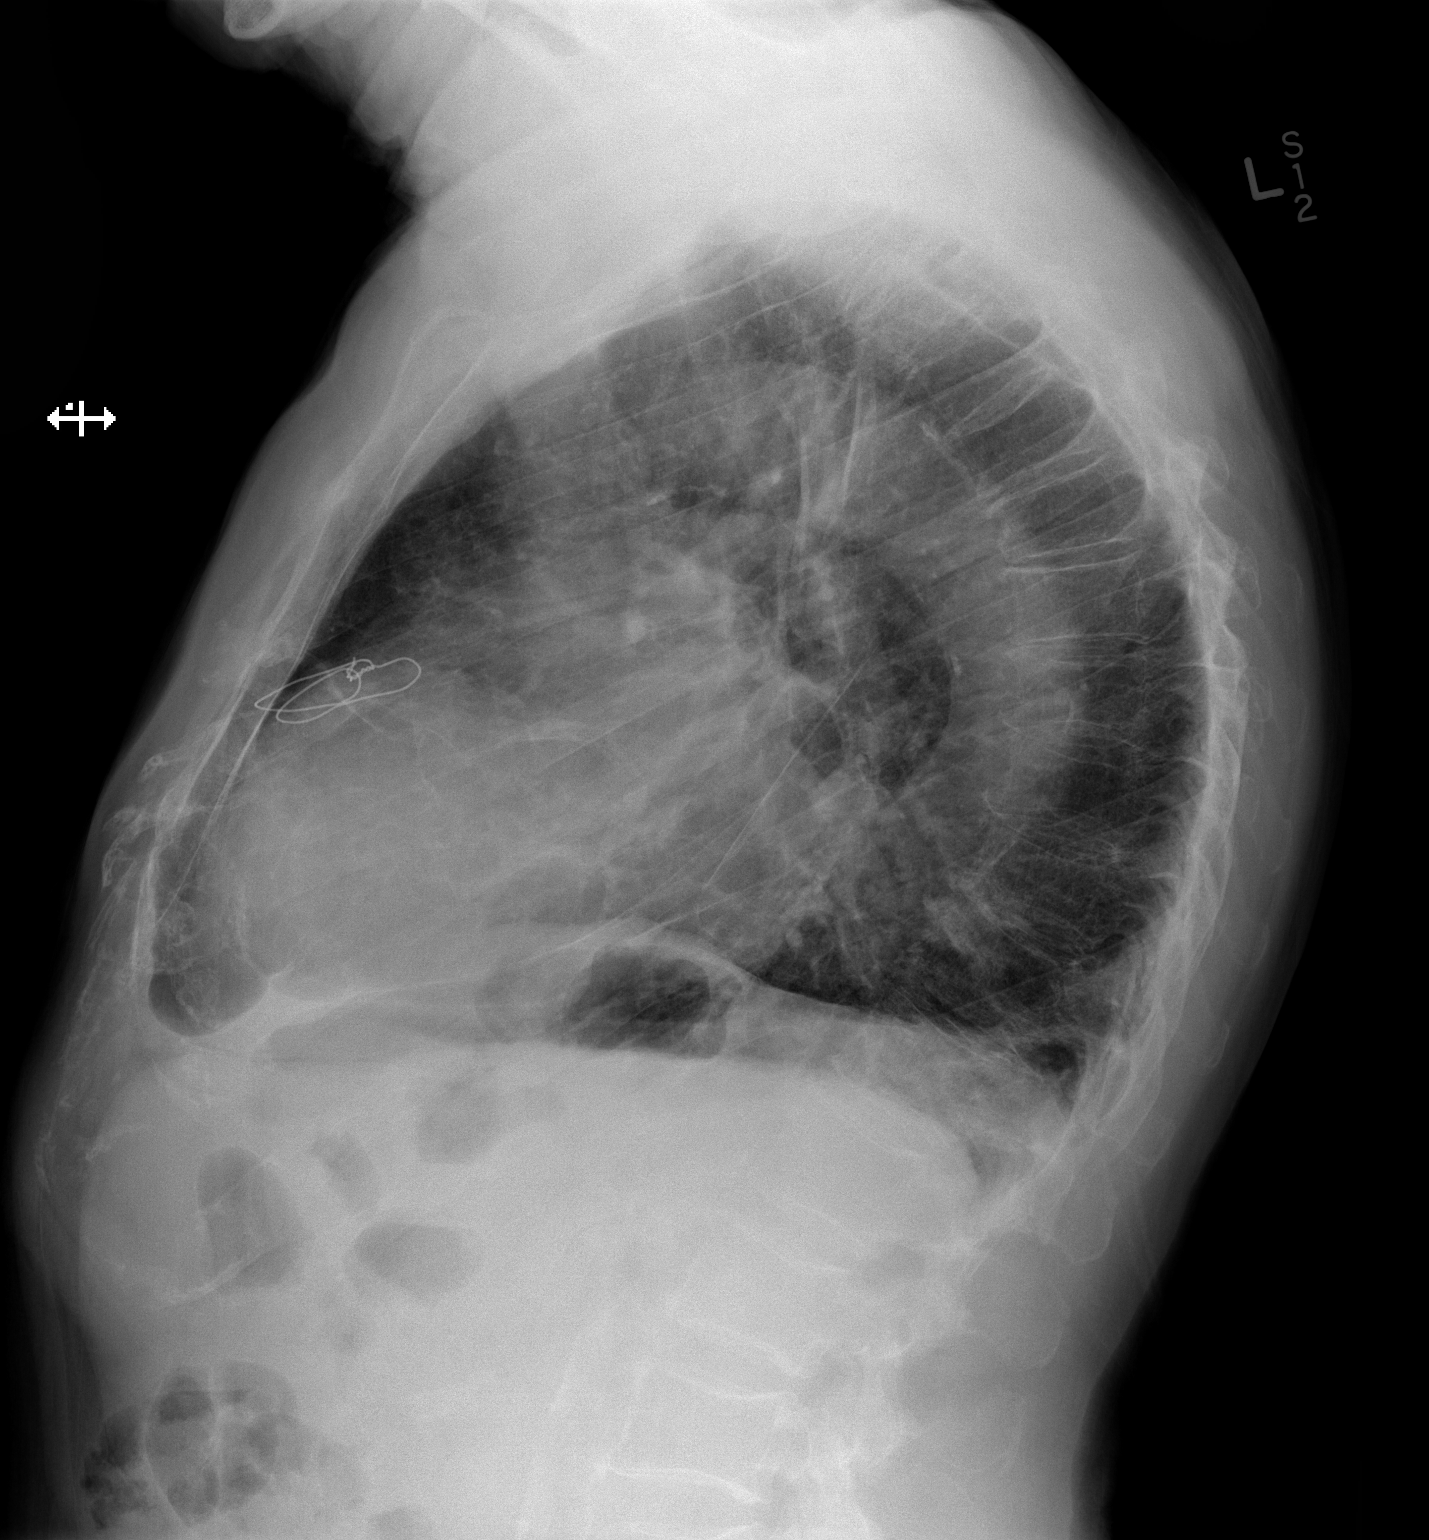

[2 of 2 positions shown; findings below may reference images not displayed]

FINDINGS: Cardiomegaly is noted. No acute infiltrate or pleural effusion. No
pulmonary edema. Osteopenia and mild degenerative changes thoracic
spine. Atherosclerotic calcifications of thoracic aorta. Bilateral
basilar mild atelectasis or scarring.
IMPRESSION: No active disease. Cardiomegaly is noted.

## 2014-06-16 IMAGING — CR DG CHEST 1V PORT
1 series · 1 of 1 positions shown · non-contrast
Comparison: Portable examinations 08/15/2013 and 08/16/2013.

CLINICAL DATA: Postop aortic valve replacement and revision of
mitral valve replacement.

EXAM:
PORTABLE CHEST - 1 VIEW

[AP]
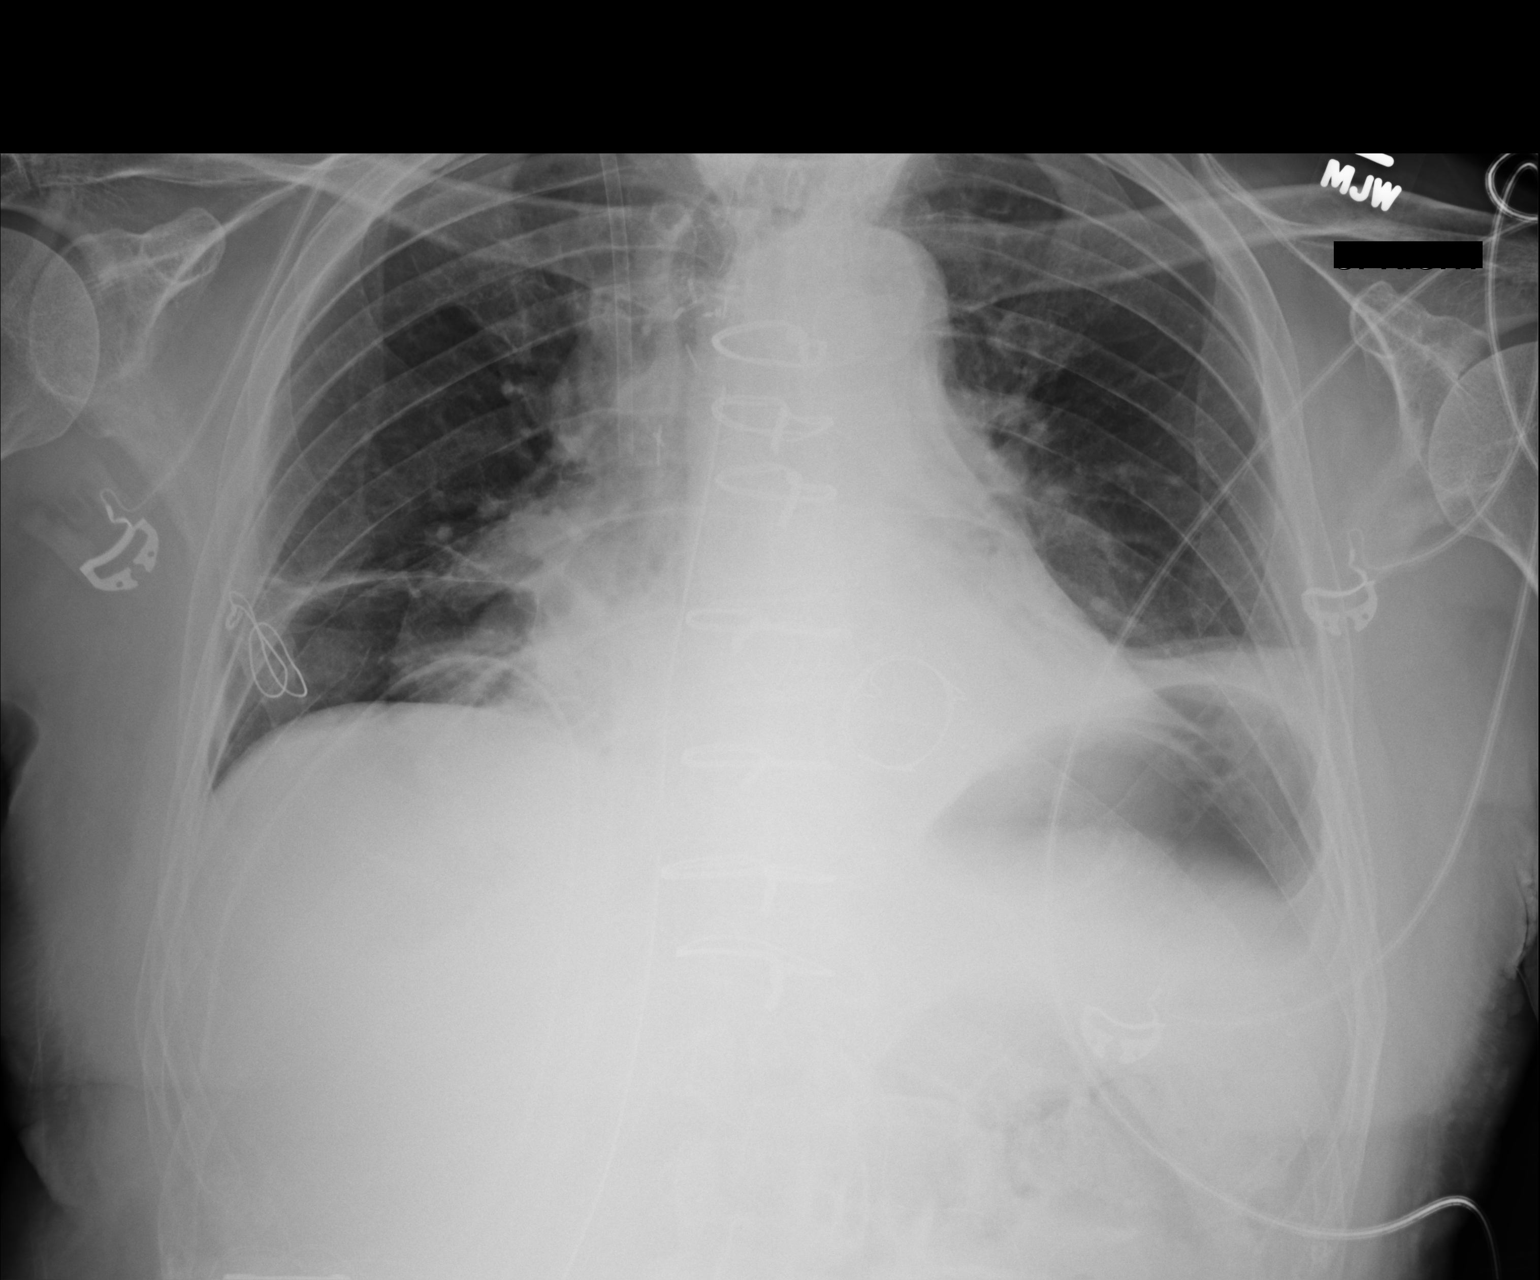

[1 of 1 positions shown; findings below may reference images not displayed]

FINDINGS: 7058 hr. Interval removal of the Swan-Ganz catheter. Right IJ sheath
and mediastinal drain remain in place. There is a right-sided chest
tube. There are persistent low lung volumes with mildly improved
bibasilar atelectasis. The heart size and mediastinal contours are
stable. There is no pneumothorax.
IMPRESSION: Slightly improved basilar aeration. No pneumothorax or other new
findings

## 2014-06-18 IMAGING — CR DG CHEST 1V PORT
1 series · 1 of 1 positions shown · non-contrast
Comparison: 08/18/2013

CLINICAL DATA: Status post chest tube removal, redo MVR and AVR

EXAM:
PORTABLE CHEST - 1 VIEW

[AP]
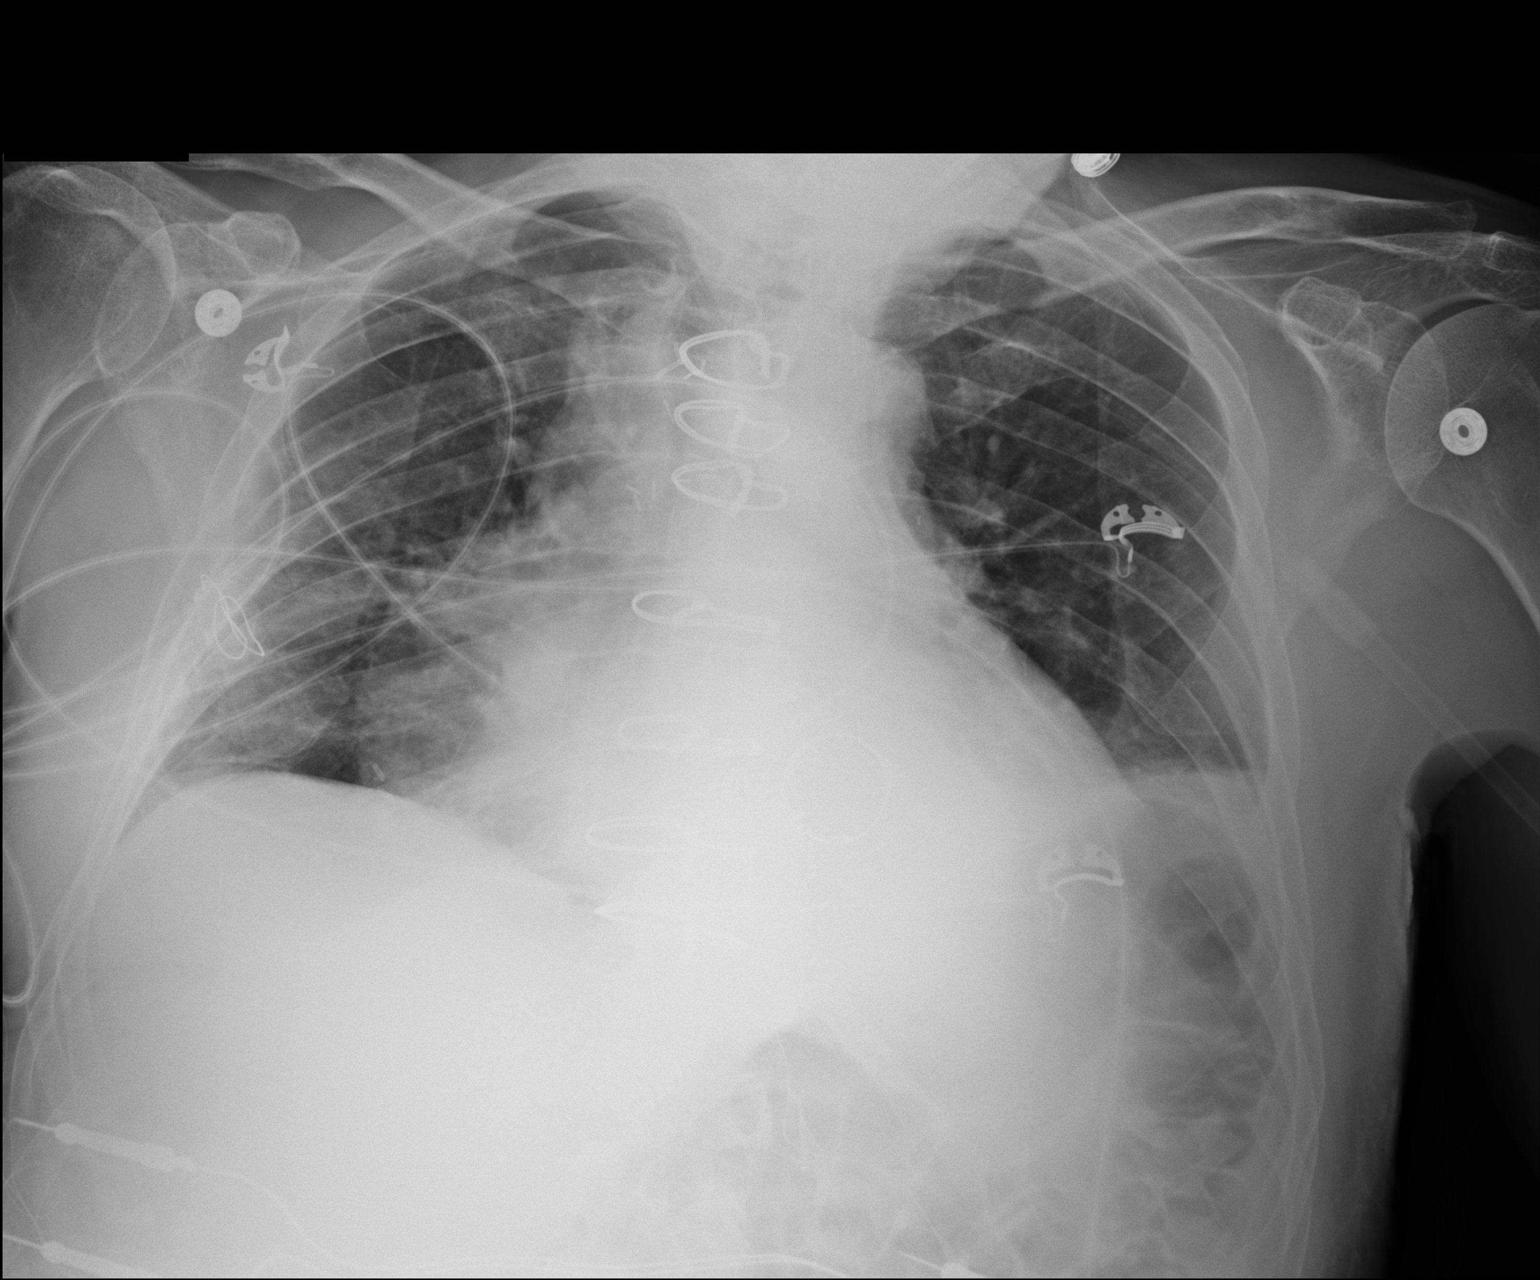

[1 of 1 positions shown; findings below may reference images not displayed]

FINDINGS: Low lung volumes. Possible trace left pleural effusion. No
pneumothorax is seen.

Cardiomegaly. Valve prosthesis. Postsurgical changes related to
prior CABG.
IMPRESSION: Possible trace left pleural effusion.

No pneumothorax is seen.

## 2014-06-19 ENCOUNTER — Encounter: Payer: Self-pay | Admitting: Interventional Cardiology

## 2014-06-19 ENCOUNTER — Ambulatory Visit (INDEPENDENT_AMBULATORY_CARE_PROVIDER_SITE_OTHER): Payer: Medicare Other | Admitting: Interventional Cardiology

## 2014-06-19 VITALS — BP 122/82 | HR 71 | Ht 68.0 in | Wt 173.6 lb

## 2014-06-19 DIAGNOSIS — Z79899 Other long term (current) drug therapy: Secondary | ICD-10-CM | POA: Diagnosis not present

## 2014-06-19 DIAGNOSIS — I4891 Unspecified atrial fibrillation: Secondary | ICD-10-CM

## 2014-06-19 DIAGNOSIS — Z952 Presence of prosthetic heart valve: Secondary | ICD-10-CM

## 2014-06-19 DIAGNOSIS — Z954 Presence of other heart-valve replacement: Secondary | ICD-10-CM

## 2014-06-19 DIAGNOSIS — I4819 Other persistent atrial fibrillation: Secondary | ICD-10-CM

## 2014-06-19 DIAGNOSIS — N189 Chronic kidney disease, unspecified: Secondary | ICD-10-CM

## 2014-06-19 MED ORDER — FUROSEMIDE 40 MG PO TABS: ORAL_TABLET | ORAL | Status: AC

## 2014-06-19 NOTE — Progress Notes (Signed)
Patient ID: Jason [name], male   DOB: [birth year], 80 y.o.   MRN: [id number] Patient ID: Jason [name], male   DOB: [birth year], 80 y.o.   MRN: [id number] Patient ID: Jason [name], male   DOB: April 27, 1932, 80 y.o.   MRN: [id number]    [place], [address], [place]  [address] Phone: [phone] Fax:  [phone]  Date:  06/19/2014   ID:  Jason [name], DOB [birth year], MRN [id number]  PCP:  Shirline Frees, MD      History of Present Illness: Jason [name] is a 80 y.o. male who has had valvular heart disease and atrial fibrillation. He underwent aortic valve and mitral valve replacement in 11/14. He has been doing well.  He had some swelling in his legs.  He was initially supposed to have a Maze procedure along with his valve surgery. Do to scar tissue from his initial surgery 9 years ago, he did not have a maze done. He has been rate controlled. He denies any palpitations. His only chest pain is if he coughs. He does not have pain at any other time. His breathing is okay. He has resumed walking. He finished cardiac rehabilitation.  He was told his kidney function was abnormal.     Wt Readings from Last 3 Encounters:  06/19/14 173 lb 9.6 oz (78.744 kg)  01/08/14 173 lb (78.472 kg)  12/06/13 173 lb (78.472 kg)     Past Medical History  Diagnosis Date  . Hyperlipidemia   . Hypertension   . Heart murmur   . Hydrocele     left  . Kidney stones   . Internal hemorrhoids   . Vocal cord paralysis   . Macular degeneration     right eye  . Blindness     left eye  . OA (osteoarthritis) of neck   . Elevated PSA   . AK (actinic keratosis)   . Severe aortic stenosis 11/14    tissue AVR   . S/P mitral valve repair 09/15/2004    Complex valvuloplasty including artificial Goretex neocord placement x4 and 63m SARP ring annuloplasty via right mini thoracotomy approach - [facility] . Mitral regurgitation 02/25/2011    Recurrent MR s/p mitral valve repair   . Atrial  fibrillation, persistent     chronic coumadin therapy  . GERD (gastroesophageal reflux disease)   . H/O hiatal hernia   . S/P redo mitral valve replacement and aortic valve replacement with bioprosthetic valves 08/15/2013    266mEdwards Magna Mitral bovine pericardial tissue valve  . S/P aortic valve replacement with bioprosthetic valve 08/15/2013    [id number] Magna Ease bovine pericardial tissue valve    Current Outpatient Prescriptions  Medication Sig Dispense Refill  . acetaminophen (TYLENOL) 650 MG CR tablet Take 1,300 mg by mouth 2 (two) times daily.       . aMarland Kitchenorvastatin (LIPITOR) 20 MG tablet Take 20 mg by mouth at bedtime.       . bMarland Kitchencomplex vitamins tablet Take 1 tablet by mouth daily.      . CMarland KitchenRTIA XT 240 MG 24 hr capsule Take 240 mg by mouth every evening.       . furosemide (LASIX) 40 MG tablet Take 40 mg by mouth 2 (two) times daily.      . gMarland Kitchenucosamine-chondroitin 500-400 MG tablet Take 1 tablet by mouth 2 (two) times daily.       . lMarland Kitchensinopril (PRINIVIL,ZESTRIL) 10 MG  tablet Take 10 mg by mouth daily.      . metoprolol tartrate (LOPRESSOR) 25 MG tablet Take 0.5 tablets (12.5 mg total) by mouth 2 (two) times daily.  30 tablet  11  . Multiple Vitamin (MULTIVITAMIN) capsule Take 1 capsule by mouth daily.      . Multiple Vitamins-Minerals (PRESERVISION AREDS PO) Take 1 tablet by mouth 2 (two) times daily.       . vitamin B-12 (CYANOCOBALAMIN) 1000 MCG tablet Take 1,000 mcg by mouth daily.      Marland Kitchen warfarin (COUMADIN) 5 MG tablet 1 tablet on all days except 1.5 tablets on Monday, Wednesday, and Fridays or as directed by coumadin clinic  45 tablet  1   No current facility-administered medications for this visit.    Allergies:    Allergies  Allergen Reactions  . Penicillins Rash    Social History:  The patient  reports that he quit smoking about 35 years ago. His smoking use included Cigarettes. He smoked 0.00 packs per day for 8 years. He has never used smokeless tobacco. He  reports that he does not drink alcohol or use illicit drugs.   Family History:  The patient's family history includes Cancer in his father; Diabetes in his father and mother; Heart attack in his mother; Hyperlipidemia in his father and mother; Hypertension in his brother, father, and mother; Stroke in his father and mother.   ROS:  Please see the history of present illness.  No nausea, vomiting.  No fevers, chills.  No focal weakness.  No dysuria.    All other systems reviewed and negative.   PHYSICAL EXAM: VS:  BP 122/82  Pulse 71  Ht _0  (1.727 m)  Wt 173 lb 9.6 oz (78.744 kg)  BMI 26.40 kg/m2  SpO2 97% Well nourished, well developed, in no acute distress HEENT: normal Neck: no JVD, no carotid bruits Cardiac:  normal S1, S2; slightly irregular; 2/6 systolic murmur , significantly less in intensity compared to pre-surgery Lungs:  clear to auscultation bilaterally, no wheezing, rhonchi or rales Abd: soft, nontender, no hepatomegaly Ext: no edema Skin: warm and dry Neuro:   no focal abnormalities noted   ASSESSMENT AND PLAN:  1. Atrial fibrillation  Notes: rate controlled. He has not tolerated higher doses of rate controlling medicines in the past. He is tolerating low dose beta blocker.  COntinue metoprolol 12.5 mg PO BID.   Prior strips from cardiac rehab when  He felt he was doing more than his usual workload  showed AFib with aberrancy.  His AFib is chronic.  No h/o CAD.  Continue beta blocker.  If sx arise, he will let us know.     2. Shortness of breath  Notes: Mild. Better with increased diuretics. Likely fluid overload postoperatively.  Decrease evening dose of Lasix to 20 mg. His creatinine was slightly elevated when checked by his PCP at 1.27. We'll recheck be met in a week.  3. Mitral Regurgitation  Notes: Status post bioprosthetic mitral valve placement in November 2014. SBE prophylaxis. 4. Acquired stenosis of aortic valve  Notes: Status post bioprosthetic aortic  valve replacement in November 2014.  SBE prophylaxis.  5. edema: After increased dose of Lasix, This is improved. This was likely related to postop fluid retention. BNP was elevated in 2/15 in ER.  Echocardiogram to get new baseline after valve surgery showed EF 50%. Okay to decrease Lasix at this point.. 6. CKD: See if Cr improves with less diuretic.  Signed, Ulice Dash  Hassell Done, MD, [facility] 06/19/2014 4:01 PM

## 2014-06-19 NOTE — Patient Instructions (Signed)
Your physician has recommended you make the following change in your medication:   1. Decrease Lasix to 40 mg in the am and 20 mg in the pm.  Your physician recommends that you return for lab work one week for Bmet on 06/26/14.   Your physician wants you to follow-up in: 6 months with Dr. Irish Lack. You will receive a reminder letter in the mail two months in advance. If you don't receive a letter, please call our office to schedule the follow-up appointment.

## 2014-06-20 ENCOUNTER — Other Ambulatory Visit: Payer: Self-pay | Admitting: Interventional Cardiology

## 2014-06-25 DIAGNOSIS — H353290 Exudative age-related macular degeneration, unspecified eye, stage unspecified: Secondary | ICD-10-CM | POA: Diagnosis not present

## 2014-06-25 DIAGNOSIS — H35059 Retinal neovascularization, unspecified, unspecified eye: Secondary | ICD-10-CM | POA: Diagnosis not present

## 2014-06-26 ENCOUNTER — Other Ambulatory Visit (INDEPENDENT_AMBULATORY_CARE_PROVIDER_SITE_OTHER): Payer: Medicare Other

## 2014-06-26 DIAGNOSIS — Z79899 Other long term (current) drug therapy: Secondary | ICD-10-CM

## 2014-06-26 LAB — BASIC METABOLIC PANEL
BUN: 15 mg/dL (ref 6–23)
CO2: 28 mEq/L (ref 19–32)
Calcium: 9.9 mg/dL (ref 8.4–10.5)
Chloride: 105 mEq/L (ref 96–112)
Creatinine, Ser: 1.1 mg/dL (ref 0.4–1.5)
GFR: 68.49 mL/min (ref >60.00)
Glucose, Bld: 92 mg/dL (ref 70–99)
Potassium: 3.9 mEq/L (ref 3.5–5.1)
Sodium: 140 mEq/L (ref 135–145)

## 2014-07-04 ENCOUNTER — Ambulatory Visit (INDEPENDENT_AMBULATORY_CARE_PROVIDER_SITE_OTHER): Payer: Medicare Other | Admitting: Pharmacist

## 2014-07-04 DIAGNOSIS — I4891 Unspecified atrial fibrillation: Secondary | ICD-10-CM

## 2014-07-04 LAB — POCT INR: INR: 2.7

## 2014-07-11 IMAGING — CR DG CHEST 2V
2 series · 2 of 2 positions shown · non-contrast
Comparison: 08/19/2013

CLINICAL DATA: Mitral valve surgery 4 weeks ago

EXAM:
CHEST  2 VIEW

[w chest pa]
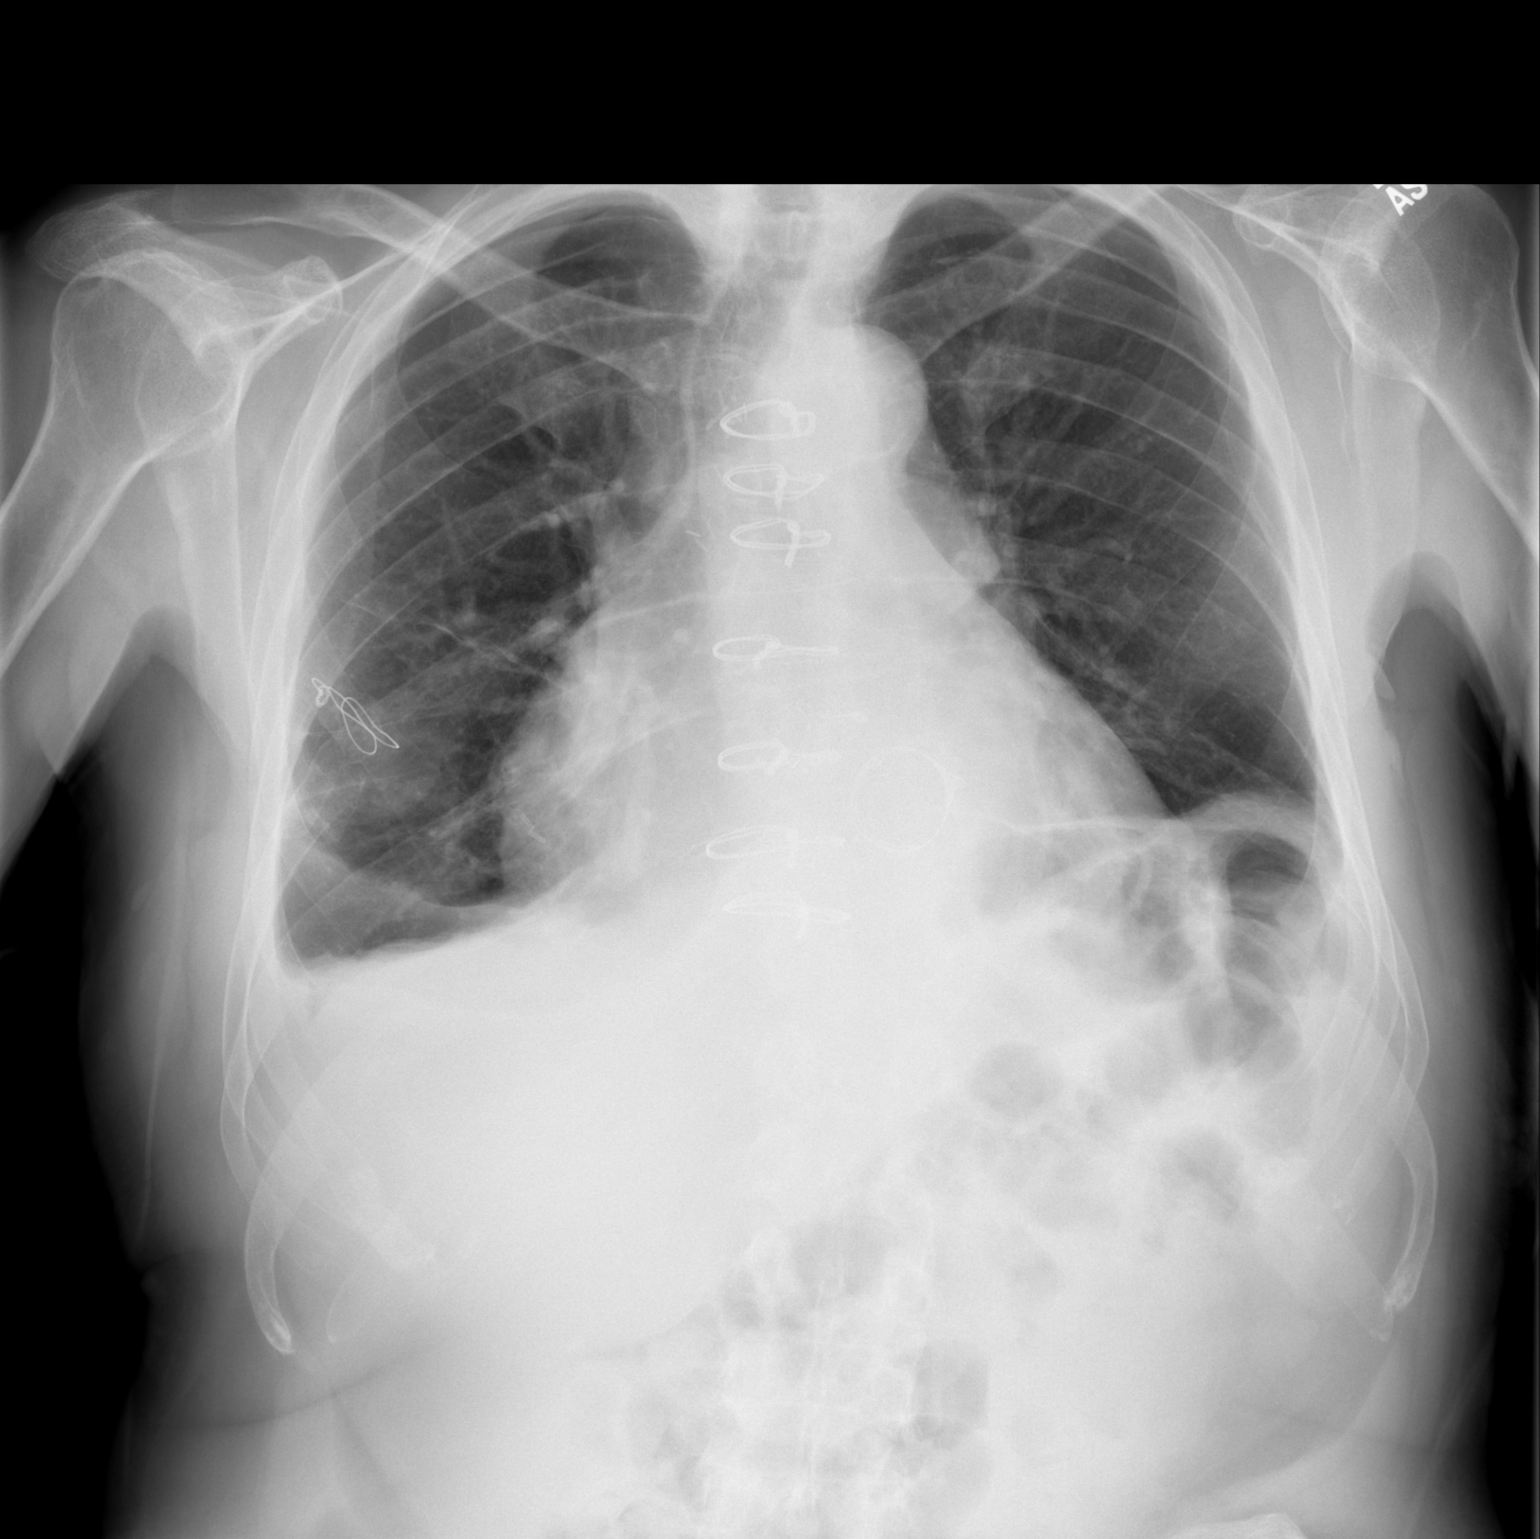

[w chest lat]
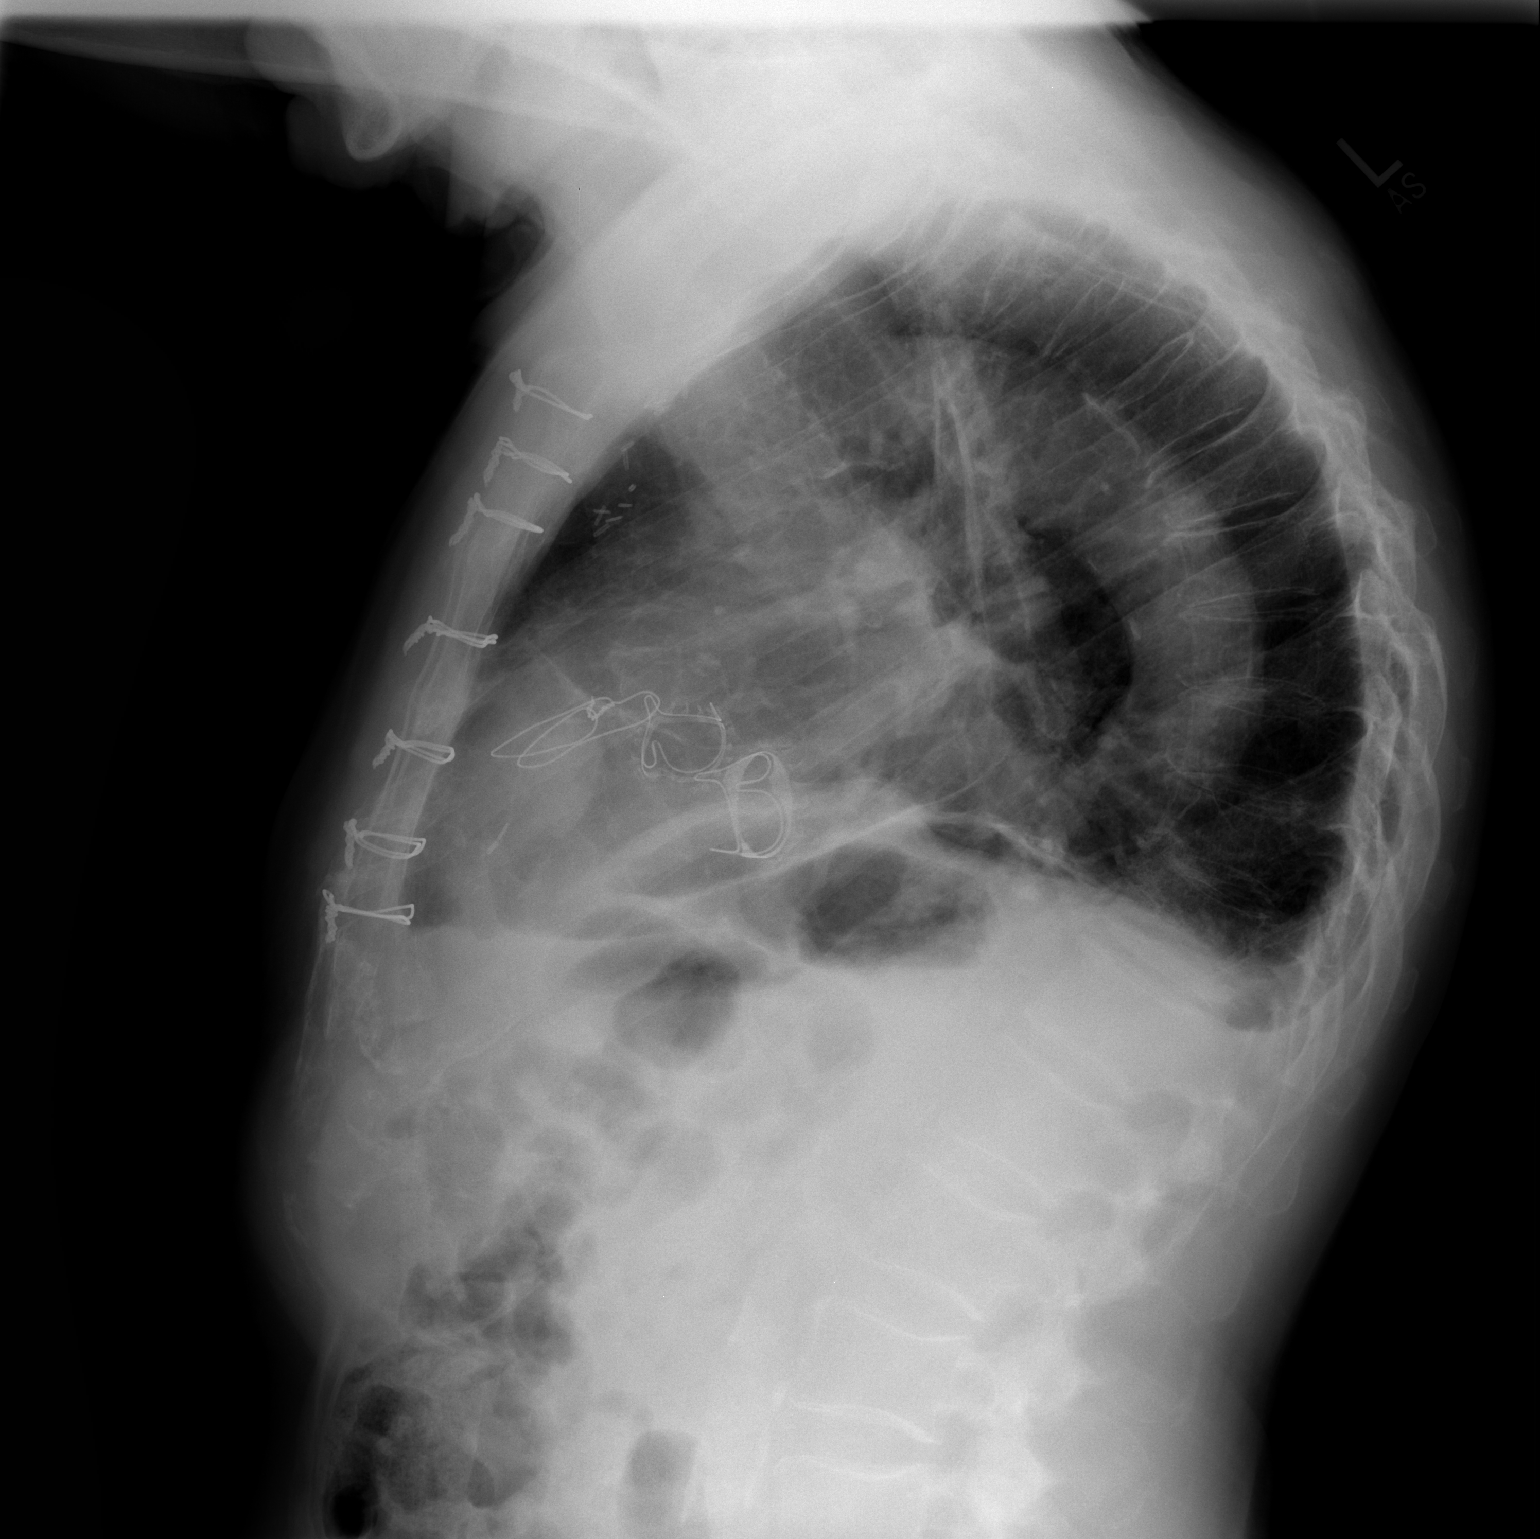

[2 of 2 positions shown; findings below may reference images not displayed]

FINDINGS: Aortic valve and mitral valve replacement. Cardiac enlargement
without heart failure. Negative for pulmonary edema. Small right
pleural effusion has developed since the prior study. Possible small
left pleural effusion. Bibasilar atelectasis. No pneumothorax.
IMPRESSION: Interval development of small right pleural effusion. Mild bibasilar
atelectasis. Negative for heart failure.

## 2014-07-16 DIAGNOSIS — H3532 Exudative age-related macular degeneration: Secondary | ICD-10-CM | POA: Diagnosis not present

## 2014-07-24 DIAGNOSIS — Z23 Encounter for immunization: Secondary | ICD-10-CM | POA: Diagnosis not present

## 2014-08-01 DIAGNOSIS — H35051 Retinal neovascularization, unspecified, right eye: Secondary | ICD-10-CM | POA: Diagnosis not present

## 2014-08-01 DIAGNOSIS — H3532 Exudative age-related macular degeneration: Secondary | ICD-10-CM | POA: Diagnosis not present

## 2014-08-15 ENCOUNTER — Ambulatory Visit (INDEPENDENT_AMBULATORY_CARE_PROVIDER_SITE_OTHER): Payer: Medicare Other | Admitting: *Deleted

## 2014-08-15 DIAGNOSIS — I4891 Unspecified atrial fibrillation: Secondary | ICD-10-CM | POA: Diagnosis not present

## 2014-08-15 LAB — POCT INR: INR: 2.8

## 2014-08-27 ENCOUNTER — Encounter: Payer: Self-pay | Admitting: Thoracic Surgery (Cardiothoracic Vascular Surgery)

## 2014-08-27 ENCOUNTER — Ambulatory Visit (INDEPENDENT_AMBULATORY_CARE_PROVIDER_SITE_OTHER): Payer: Medicare Other | Admitting: Thoracic Surgery (Cardiothoracic Vascular Surgery)

## 2014-08-27 VITALS — BP 150/96 | HR 75 | Resp 20 | Ht 68.0 in | Wt 180.0 lb

## 2014-08-27 DIAGNOSIS — Z953 Presence of xenogenic heart valve: Secondary | ICD-10-CM | POA: Diagnosis not present

## 2014-08-27 DIAGNOSIS — I350 Nonrheumatic aortic (valve) stenosis: Secondary | ICD-10-CM

## 2014-08-27 DIAGNOSIS — I340 Nonrheumatic mitral (valve) insufficiency: Secondary | ICD-10-CM | POA: Diagnosis not present

## 2014-08-27 DIAGNOSIS — Z954 Presence of other heart-valve replacement: Secondary | ICD-10-CM

## 2014-08-27 DIAGNOSIS — Z952 Presence of prosthetic heart valve: Secondary | ICD-10-CM

## 2014-08-27 NOTE — Progress Notes (Signed)
[address]       [place] [address]             [phone]     CARDIOTHORACIC SURGERY OFFICE NOTE  Referring Provider is Jettie Booze, MD PCP is Shirline Frees, MD   HPI:  Patient returns for routine followup 1 year status post aortic valve replacement and redo mitral valve replacement on 08/15/2013, both using a bioprosthetic tissue valves. His postoperative recovery was uncomplicated and he was last seen here in our office on 01/08/2014.  Since then he has continued to follow-up intermittently with Dr. Irish Lack who saw him recently on 06/19/2014. The patient reports that he is doing exceptionally well. He denies any symptoms of exertional shortness of breath, chest discomfort, or fatigue. He reports normal physical activity without any limitations other than that related to his underlying degenerative arthritis. He feels quite well. He remains on anticoagulation using warfarin. He has not had any complications related to anticoagulation.   Current Outpatient Prescriptions  Medication Sig Dispense Refill  . acetaminophen (TYLENOL) 650 MG CR tablet Take 1,300 mg by mouth 2 (two) times daily.     Marland Kitchen atorvastatin (LIPITOR) 20 MG tablet Take 20 mg by mouth at bedtime.     Marland Kitchen b complex vitamins tablet Take 1 tablet by mouth daily.    Marland Kitchen CARTIA XT 240 MG 24 hr capsule Take 240 mg by mouth every evening.     . furosemide (LASIX) 40 MG tablet 40 mg in the am and 20 mg in the pm 30 tablet   . glucosamine-chondroitin 500-400 MG tablet Take 1 tablet by mouth 2 (two) times daily.     Marland Kitchen lisinopril (PRINIVIL,ZESTRIL) 10 MG tablet Take 10 mg by mouth daily.    . metoprolol tartrate (LOPRESSOR) 25 MG tablet Take 0.5 tablets (12.5 mg total) by mouth 2 (two) times daily. 30 tablet 11  . Multiple Vitamin (MULTIVITAMIN) capsule Take 1 capsule by mouth daily.    . Multiple Vitamins-Minerals (PRESERVISION AREDS PO) Take 1 tablet by mouth 2 (two) times daily.     . vitamin  B-12 (CYANOCOBALAMIN) 1000 MCG tablet Take 1,000 mcg by mouth daily.    Marland Kitchen warfarin (COUMADIN) 5 MG tablet TAKE 1 TABLET BY MOUTH EVERY DAY EXCEPT TAKE 1&1/2 TABLETS BY MOUTH ON MONDAY, WEDNESDAY AND FRIDAY OR AS DIRECTED 45 tablet 3   No current facility-administered medications for this visit.      Physical Exam:   BP 150/96 mmHg  Pulse 75  Resp 20  Ht 5\' 8"  (1.727 m)  Wt 180 lb (81.647 kg)  BMI 27.38 kg/m2  SpO2 98%  General:  Well-appearing  Chest:   Clear  CV:   Irregular rate and rhythm with soft systolic murmur  Incisions:  Completely healed  Abdomen:  Soft and nontender  Extremities:  Warm and well-perfused  Diagnostic Tests:  Transthoracic Echocardiography  Patient:  Jason [name], Jason [name] MR #:    [id number] Study Date: 09/21/2013 Gender:   M Age:    80 Height:   172.7cm Weight:   78.5kg BSA:    1.109m^2 Pt. Status: Room:  ATTENDING  [name] SONOGRAPHER Charlann Noss, RDCS ORDERING   Casandra Doffing, MD PERFORMING  Casandra Doffing, MD REFERRING  Casandra Doffing, MD cc: Dr. Shirline Frees  ------------------------------------------------------------ LV EF: 50%  ------------------------------------------------------------ Indications:   V43.3 S/P Heart valve replacement. Atrial fibrillation - 427.31.  ------------------------------------------------------------ History:  PMH: Acquired from the patient and from the  patient's chart. Dyspnea, murmur, and bilateral lower extremity edema. Atrial fibrillation. Aortic valve disease. Mitral valve disease. Risk factors: Hypertension. Dyslipidemia.  ------------------------------------------------------------ Study Conclusions  - Left ventricle: Subvalvular mitral apparatus mobile in the left ventricle. The cavity size was normal. The estimated ejection fraction was 50%. Although no diagnostic regional wall motion abnormality was identified, this possibility cannot  be completely excluded on the basis of this study. - Aortic valve: A bioprosthesis was present. - Mitral valve: A bioprosthesis was present. - Left atrium: The atrium was moderately dilated. - Right ventricle: The cavity size was moderately dilated. Systolic function was mildly reduced. - Right atrium: The atrium was mildly dilated.  ------------------------------------------------------------ Labs, prior tests, procedures, and surgery: Valve surgery.   Mitral valve replacement. Aortic valve replacement.  Transthoracic echocardiography. M-mode, complete 2D, spectral Doppler, and color Doppler. Height: Height: 172.7cm. Height: 68in. Weight: Weight: 78.5kg. Weight: 172.6lb. Body mass index: BMI: 26.3kg/m^2. Body surface area:  BSA: 1.31m^2. Blood pressure:   112/70. Patient status: Outpatient. Location: [facility]  ------------------------------------------------------------  ------------------------------------------------------------ Left ventricle: Subvalvular mitral apparatus mobile in the left ventricle. The cavity size was normal. The estimated ejection fraction was 50%. Although no diagnostic regional wall motion abnormality was identified, this possibility cannot be completely excluded on the basis of this study.  ------------------------------------------------------------ Aortic valve: A bioprosthesis was present. Doppler:  No significant regurgitation.  VTI ratio of LVOT to aortic valve: 0.24. Peak velocity ratio of LVOT to aortic valve: 0.28.  Mean gradient: 10mm Hg (S). Peak gradient: 64mm Hg (S).  ------------------------------------------------------------ Aorta: Aortic root: The aortic root was normal in size.  ------------------------------------------------------------ Mitral valve: A bioprosthesis was present. Doppler:  No significant regurgitation.  Valve area by pressure half-time: 1.75cm^2. Indexed valve area by  pressure half-time: 0.91cm^2/m^2.  Mean gradient: 48mm Hg (D). Peak gradient: 38mm Hg (D).  ------------------------------------------------------------ Left atrium: The atrium was moderately dilated.  ------------------------------------------------------------ Atrial septum: Poorly visualized.  ------------------------------------------------------------ Right ventricle: The cavity size was moderately dilated. Systolic function was mildly reduced.  ------------------------------------------------------------ Pulmonic valve:  Poorly visualized. The valve appears to be grossly normal.  Doppler:  No significant regurgitation.  ------------------------------------------------------------ Tricuspid valve:  Mildly thickened leaflets. Doppler: Mild regurgitation.  ------------------------------------------------------------ Pulmonary artery:  Systolic pressure could not be accurately estimated, but is likely at least mildly elevated.  ------------------------------------------------------------ Right atrium: The atrium was mildly dilated.  ------------------------------------------------------------ Pericardium: There was no pericardial effusion.  ------------------------------------------------------------ Systemic veins: Inferior vena cava: The vessel was dilated; the respirophasic diameter changes were blunted (< 50%); findings are consistent with elevated central venous pressure.  ------------------------------------------------------------  2D measurements    Normal Doppler measurements  Norma Left ventricle                    l LVID ED,  37.8 mm   43-52  LVOT chord,             Peak vel, 66 cm/s   ----- PLAX              S LVID ES,  31.2 mm   23-38  VTI, S  10. cm    ----- chord,                   9 PLAX              Aortic valve FS, chord,  17 %    >29   Peak vel, 233 cm/s   ----- PLAX  S LVPW, ED  9.99 mm   ------ Mean vel, 156 cm/s   ----- IVS/LVPW  1.07    <1.3  S ratio, ED           VTI, S  45. cm    ----- Vol ED,   94 ml   ------       2 MOD1              Mean    12 mm Hg  ----- Vol ES,   50 ml   ------ gradient, MOD1              S EF, MOD1   47 %   ------ Peak    22 mm Hg  ----- Vol index,  49 ml/m^2 ------ gradient, ED, MOD1            S Vol index,  26 ml/m^2 ------ VTI ratio 0.2     ----- ES, MOD1            LVOT/AV   4 Ventricular septum       Peak vel 0.2     ----- IVS, ED  10.7 mm   ------ ratio,   8 Aorta             LVOT/AV Root diam,  32 mm   ------ Mitral valve ED               Mean vel, 120 cm/s   ----- Left atrium          D AP dim    52 mm   ------ Pressure 126 ms    ----- AP dim   2.71 cm/m^2 <2.2  half-time index             Mean    7 mm Hg  -----                gradient,                D                Peak    21 mm Hg  -----                gradient,                D                Area   1.7 cm^2   -----                (PHT)    5                Area   0.9 cm^2/m^2 -----                index    1                (PHT)                Annulus  65. cm    -----                VTI     1                Systemic veins                Estimated  5 mm Hg  -----  CVP  ------------------------------------------------------------ Prepared and Electronically Authenticated by  Casandra Doffing,  MD 2014-12-11T12:51:29.777   Impression:  Patient is doing very well approximately one year status post aortic valve replacement and redo mitral valve replacement using bioprosthetic tissue valve. He reports normal exercise tolerance without symptoms of chronic diastolic congestive heart failure   Plan:  Patient will call and return to see [place] in the future as needed. He has been reminded regarding the lifelong need for antibiotic prophylaxis for all dental cleaning and related procedures.  I spent in excess of 10 minutes during the conduct of this office consultation and >50% of this time involved direct face-to-face encounter with the patient for counseling and/or coordination of their care.   Valentina Gu. Roxy Manns, MD 08/27/2014 1:40 PM

## 2014-08-27 NOTE — Patient Instructions (Signed)

## 2014-09-10 DIAGNOSIS — H35051 Retinal neovascularization, unspecified, right eye: Secondary | ICD-10-CM | POA: Diagnosis not present

## 2014-09-10 DIAGNOSIS — H3532 Exudative age-related macular degeneration: Secondary | ICD-10-CM | POA: Diagnosis not present

## 2014-09-12 DIAGNOSIS — Z125 Encounter for screening for malignant neoplasm of prostate: Secondary | ICD-10-CM | POA: Diagnosis not present

## 2014-09-12 DIAGNOSIS — N433 Hydrocele, unspecified: Secondary | ICD-10-CM | POA: Diagnosis not present

## 2014-09-12 DIAGNOSIS — Z87442 Personal history of urinary calculi: Secondary | ICD-10-CM | POA: Diagnosis not present

## 2014-09-12 DIAGNOSIS — N401 Enlarged prostate with lower urinary tract symptoms: Secondary | ICD-10-CM | POA: Diagnosis not present

## 2014-09-13 DIAGNOSIS — I10 Essential (primary) hypertension: Secondary | ICD-10-CM | POA: Diagnosis not present

## 2014-09-20 ENCOUNTER — Encounter (HOSPITAL_COMMUNITY): Payer: Self-pay | Admitting: Interventional Cardiology

## 2014-09-26 ENCOUNTER — Ambulatory Visit (INDEPENDENT_AMBULATORY_CARE_PROVIDER_SITE_OTHER): Payer: Medicare Other | Admitting: Pharmacist

## 2014-09-26 DIAGNOSIS — I4891 Unspecified atrial fibrillation: Secondary | ICD-10-CM | POA: Diagnosis not present

## 2014-09-26 LAB — POCT INR: INR: 2.6

## 2014-10-15 DIAGNOSIS — H3532 Exudative age-related macular degeneration: Secondary | ICD-10-CM | POA: Diagnosis not present

## 2014-10-15 DIAGNOSIS — H35051 Retinal neovascularization, unspecified, right eye: Secondary | ICD-10-CM | POA: Diagnosis not present

## 2014-11-07 ENCOUNTER — Ambulatory Visit (INDEPENDENT_AMBULATORY_CARE_PROVIDER_SITE_OTHER): Payer: Medicare Other | Admitting: *Deleted

## 2014-11-07 DIAGNOSIS — I4891 Unspecified atrial fibrillation: Secondary | ICD-10-CM

## 2014-11-07 LAB — POCT INR: INR: 2.9

## 2014-11-08 DIAGNOSIS — I471 Supraventricular tachycardia: Secondary | ICD-10-CM | POA: Diagnosis not present

## 2014-11-08 DIAGNOSIS — M1990 Unspecified osteoarthritis, unspecified site: Secondary | ICD-10-CM | POA: Diagnosis not present

## 2014-11-08 DIAGNOSIS — I340 Nonrheumatic mitral (valve) insufficiency: Secondary | ICD-10-CM | POA: Diagnosis not present

## 2014-11-08 DIAGNOSIS — I10 Essential (primary) hypertension: Secondary | ICD-10-CM | POA: Diagnosis not present

## 2014-11-08 DIAGNOSIS — I4891 Unspecified atrial fibrillation: Secondary | ICD-10-CM | POA: Diagnosis not present

## 2014-11-08 DIAGNOSIS — E782 Mixed hyperlipidemia: Secondary | ICD-10-CM | POA: Diagnosis not present

## 2014-11-13 ENCOUNTER — Other Ambulatory Visit: Payer: Self-pay | Admitting: Interventional Cardiology

## 2014-11-14 ENCOUNTER — Other Ambulatory Visit: Payer: Self-pay | Admitting: Surgery

## 2014-11-14 ENCOUNTER — Other Ambulatory Visit: Payer: Self-pay | Admitting: Interventional Cardiology

## 2014-11-20 DIAGNOSIS — H3532 Exudative age-related macular degeneration: Secondary | ICD-10-CM | POA: Diagnosis not present

## 2014-11-20 DIAGNOSIS — H35051 Retinal neovascularization, unspecified, right eye: Secondary | ICD-10-CM | POA: Diagnosis not present

## 2014-12-16 ENCOUNTER — Other Ambulatory Visit: Payer: Self-pay | Admitting: Interventional Cardiology

## 2014-12-17 ENCOUNTER — Other Ambulatory Visit: Payer: Self-pay | Admitting: Interventional Cardiology

## 2014-12-17 DIAGNOSIS — H3532 Exudative age-related macular degeneration: Secondary | ICD-10-CM | POA: Diagnosis not present

## 2014-12-17 DIAGNOSIS — H26493 Other secondary cataract, bilateral: Secondary | ICD-10-CM | POA: Diagnosis not present

## 2014-12-19 ENCOUNTER — Ambulatory Visit (INDEPENDENT_AMBULATORY_CARE_PROVIDER_SITE_OTHER): Payer: Medicare Other | Admitting: *Deleted

## 2014-12-19 DIAGNOSIS — I4891 Unspecified atrial fibrillation: Secondary | ICD-10-CM

## 2014-12-19 LAB — POCT INR: INR: 1.9

## 2014-12-25 DIAGNOSIS — H3532 Exudative age-related macular degeneration: Secondary | ICD-10-CM | POA: Diagnosis not present

## 2015-01-16 ENCOUNTER — Ambulatory Visit (INDEPENDENT_AMBULATORY_CARE_PROVIDER_SITE_OTHER): Payer: Medicare Other | Admitting: *Deleted

## 2015-01-16 ENCOUNTER — Encounter: Payer: Self-pay | Admitting: Interventional Cardiology

## 2015-01-16 ENCOUNTER — Ambulatory Visit (INDEPENDENT_AMBULATORY_CARE_PROVIDER_SITE_OTHER): Payer: Medicare Other | Admitting: Interventional Cardiology

## 2015-01-16 VITALS — BP 122/72 | HR 73 | Ht 68.0 in | Wt 175.1 lb

## 2015-01-16 DIAGNOSIS — I4891 Unspecified atrial fibrillation: Secondary | ICD-10-CM

## 2015-01-16 DIAGNOSIS — Z953 Presence of xenogenic heart valve: Secondary | ICD-10-CM | POA: Diagnosis not present

## 2015-01-16 DIAGNOSIS — Z952 Presence of prosthetic heart valve: Secondary | ICD-10-CM

## 2015-01-16 DIAGNOSIS — I159 Secondary hypertension, unspecified: Secondary | ICD-10-CM | POA: Diagnosis not present

## 2015-01-16 DIAGNOSIS — Z954 Presence of other heart-valve replacement: Secondary | ICD-10-CM

## 2015-01-16 DIAGNOSIS — I481 Persistent atrial fibrillation: Secondary | ICD-10-CM

## 2015-01-16 DIAGNOSIS — I4819 Other persistent atrial fibrillation: Secondary | ICD-10-CM

## 2015-01-16 LAB — POCT INR: INR: 2.2

## 2015-01-16 NOTE — Patient Instructions (Signed)
Your physician recommends that you continue on your current medications as directed. Please refer to the Current Medication list given to you today.  Your physician wants you to follow-up in: 1 year with Dr. Varanasi. You will receive a reminder letter in the mail two months in advance. If you don't receive a letter, please call our office to schedule the follow-up appointment.  

## 2015-01-16 NOTE — Progress Notes (Signed)
Patient ID: Jason [name], male   DOB: Dec 23, 1931, 81 y.o.   MRN: [id number]     [address] [place], [place]  [address] Phone: [phone] Fax:  [phone]  Date:  01/16/2015   ID:  Jason [name], DOB Sep 04, 1932, MRN [id number]  PCP:  Shirline Frees, MD      History of Present Illness: [name] is a 81 y.o. male who has had valvular heart disease and atrial fibrillation. He underwent aortic valve and mitral valve replacement in 11/14. He has been doing well.  He had some swelling in his legs.  He was initially supposed to have a Maze procedure along with his valve surgery. Due to scar tissue from his initial surgery 9 years ago, he did not have a maze done. He has been rate controlled. He denies any palpitations. His only chest pain is if he coughs. He does not have pain at any other time. His breathing is okay. He has resumed walking. He finished cardiac rehabilitation.  He was told his kidney function was abnormal.    He has intentionally lost weight through portion control and exercise.    Wt Readings from Last 3 Encounters:  01/16/15 175 lb 1.9 oz (79.434 kg)  08/27/14 180 lb (81.647 kg)  06/19/14 173 lb 9.6 oz (78.744 kg)     Past Medical History  Diagnosis Date  . Hyperlipidemia   . Hypertension   . Heart murmur   . Hydrocele     left  . Kidney stones   . Internal hemorrhoids   . Vocal cord paralysis   . Macular degeneration     right eye  . Blindness     left eye  . OA (osteoarthritis) of neck   . Elevated PSA   . AK (actinic keratosis)   . Severe aortic stenosis 11/14    tissue AVR   . S/P mitral valve repair 09/15/2004    Complex valvuloplasty including artificial Goretex neocord placement x4 and 46mm SARP ring annuloplasty via right mini thoracotomy approach - Dr Evelina Dun @ [facility]  . Mitral regurgitation 02/25/2011    Recurrent MR s/p mitral valve repair   . Atrial fibrillation, persistent     chronic coumadin therapy  . GERD  (gastroesophageal reflux disease)   . H/O hiatal hernia   . S/P redo mitral valve replacement and aortic valve replacement with bioprosthetic valves 08/15/2013    11mm Edwards Magna Mitral bovine pericardial tissue valve  . S/P aortic valve replacement with bioprosthetic valve 08/15/2013    24mm Edwards Magna Ease bovine pericardial tissue valve    Current Outpatient Prescriptions  Medication Sig Dispense Refill  . acetaminophen (TYLENOL) 650 MG CR tablet Take 1,300 mg by mouth 2 (two) times daily.     Marland Kitchen atorvastatin (LIPITOR) 20 MG tablet Take 20 mg by mouth at bedtime.     Marland Kitchen b complex vitamins tablet Take 1 tablet by mouth daily.    Marland Kitchen CARTIA XT 240 MG 24 hr capsule Take 240 mg by mouth every evening.     . furosemide (LASIX) 40 MG tablet 40 mg in the am and 20 mg in the pm 30 tablet   . glucosamine-chondroitin 500-400 MG tablet Take 1 tablet by mouth 2 (two) times daily.     Marland Kitchen lisinopril (PRINIVIL,ZESTRIL) 10 MG tablet Take 10 mg by mouth daily.    . metoprolol tartrate (LOPRESSOR) 25 MG tablet TAKE 1/2 TABLET BY MOUTH TWICE DAILY 90 tablet  0  . Multiple Vitamin (MULTIVITAMIN) capsule Take 1 capsule by mouth daily.    . Multiple Vitamins-Minerals (PRESERVISION AREDS PO) Take 1 tablet by mouth 2 (two) times daily.     . vitamin B-12 (CYANOCOBALAMIN) 1000 MCG tablet Take 1,000 mcg by mouth daily.    Marland Kitchen warfarin (COUMADIN) 5 MG tablet TAKE 1 TABLET BY MOUTH EVERY DAY EXCEPT TAKE 1& 1/2 TABLET ON MONDAY WEDNESDAY AND FRIDAY AS DIRECTED 45 tablet 3   No current facility-administered medications for this visit.    Allergies:    Allergies  Allergen Reactions  . Penicillins Rash    Social History:  The patient  reports that he quit smoking about 36 years ago. His smoking use included Cigarettes. He quit after 8 years of use. He has never used smokeless tobacco. He reports that he does not drink alcohol or use illicit drugs.   Family History:  The patient's family history includes Cancer in  his father; Diabetes in his father and mother; Heart attack in his mother; Hyperlipidemia in his father and mother; Hypertension in his brother, father, and mother; Stroke in his father and mother.   ROS:  Please see the history of present illness.  No nausea, vomiting.  No fevers, chills.  No focal weakness.  No dysuria.  Rare headache- thought to be due to metoprolol in his mind.  All other systems reviewed and negative.   PHYSICAL EXAM: VS:  BP 122/72 mmHg  Pulse 73  Ht 5\' 8"  (1.727 m)  Wt 175 lb 1.9 oz (79.434 kg)  BMI 26.63 kg/m2 Well nourished, well developed, in no acute distress HEENT: normal Neck: no JVD, no carotid bruits Cardiac:  normal S1, S2; slightly irregular; 2/6 systolic murmur , significantly less in intensity compared to pre-surgery Lungs:  clear to auscultation bilaterally, no wheezing, rhonchi or rales Abd: soft, nontender, no hepatomegaly Ext: no edema Skin: warm and dry Neuro:   no focal abnormalities noted Psych: normal affect   ASSESSMENT AND PLAN:  1. Atrial fibrillation  Notes: rate controlled. He has not tolerated higher doses of rate controlling medicines in the past. He is tolerating low dose beta blocker.  COntinue metoprolol 12.5 mg PO BID.   Prior strips from cardiac rehab when  He felt he was doing more than his usual workload  showed AFib with aberrancy.  His AFib is chronic.  No h/o CAD.  Continue beta blocker.  If sx arise, he will let us know.    No bleeding issues.  He will have INR checked today.   2. Shortness of breath  Notes: Resolved even with   Decreased evening dose of Lasix to 20 mg. His creatinine was slightly elevated when checked by his PCP at 1.27.  Repeat was 1.1 in 9/15. 3. Mitral Regurgitation  Notes: Status post bioprosthetic mitral valve placement in November 2014. SBE prophylaxis. 4. Acquired stenosis of aortic valve  Notes: Status post bioprosthetic aortic valve replacement in November 2014.  SBE prophylaxis.  5. Edema:  Resolved.  Echocardiogram to get new baseline after valve surgery showed EF 50%. Tolerating decreased Lasix at this point.. 6. CKD: Improved with less diuretic.  Signed, Mina Marble, MD, [facility] 01/16/2015 11:31 AM

## 2015-01-30 DIAGNOSIS — H3532 Exudative age-related macular degeneration: Secondary | ICD-10-CM | POA: Diagnosis not present

## 2015-02-14 ENCOUNTER — Other Ambulatory Visit: Payer: Self-pay | Admitting: Interventional Cardiology

## 2015-02-27 ENCOUNTER — Ambulatory Visit (INDEPENDENT_AMBULATORY_CARE_PROVIDER_SITE_OTHER): Payer: Medicare Other | Admitting: *Deleted

## 2015-02-27 DIAGNOSIS — Z952 Presence of prosthetic heart valve: Secondary | ICD-10-CM

## 2015-02-27 DIAGNOSIS — I4891 Unspecified atrial fibrillation: Secondary | ICD-10-CM | POA: Diagnosis not present

## 2015-02-27 DIAGNOSIS — Z953 Presence of xenogenic heart valve: Secondary | ICD-10-CM

## 2015-02-27 DIAGNOSIS — Z954 Presence of other heart-valve replacement: Secondary | ICD-10-CM | POA: Diagnosis not present

## 2015-02-27 LAB — POCT INR: INR: 2.9

## 2015-02-28 DIAGNOSIS — H3532 Exudative age-related macular degeneration: Secondary | ICD-10-CM | POA: Diagnosis not present

## 2015-02-28 DIAGNOSIS — H43811 Vitreous degeneration, right eye: Secondary | ICD-10-CM | POA: Diagnosis not present

## 2015-03-07 ENCOUNTER — Other Ambulatory Visit: Payer: Self-pay | Admitting: Interventional Cardiology

## 2015-03-07 DIAGNOSIS — H3532 Exudative age-related macular degeneration: Secondary | ICD-10-CM | POA: Diagnosis not present

## 2015-04-10 ENCOUNTER — Ambulatory Visit (INDEPENDENT_AMBULATORY_CARE_PROVIDER_SITE_OTHER): Payer: Medicare Other | Admitting: *Deleted

## 2015-04-10 DIAGNOSIS — Z954 Presence of other heart-valve replacement: Secondary | ICD-10-CM

## 2015-04-10 DIAGNOSIS — I4891 Unspecified atrial fibrillation: Secondary | ICD-10-CM | POA: Diagnosis not present

## 2015-04-10 DIAGNOSIS — Z953 Presence of xenogenic heart valve: Secondary | ICD-10-CM

## 2015-04-10 DIAGNOSIS — Z952 Presence of prosthetic heart valve: Secondary | ICD-10-CM

## 2015-04-10 LAB — POCT INR: INR: 1.8

## 2015-04-11 DIAGNOSIS — L821 Other seborrheic keratosis: Secondary | ICD-10-CM | POA: Diagnosis not present

## 2015-04-11 DIAGNOSIS — L814 Other melanin hyperpigmentation: Secondary | ICD-10-CM | POA: Diagnosis not present

## 2015-04-11 DIAGNOSIS — L570 Actinic keratosis: Secondary | ICD-10-CM | POA: Diagnosis not present

## 2015-04-17 DIAGNOSIS — H3532 Exudative age-related macular degeneration: Secondary | ICD-10-CM | POA: Diagnosis not present

## 2015-05-08 ENCOUNTER — Ambulatory Visit (INDEPENDENT_AMBULATORY_CARE_PROVIDER_SITE_OTHER): Payer: Medicare Other | Admitting: *Deleted

## 2015-05-08 DIAGNOSIS — Z952 Presence of prosthetic heart valve: Secondary | ICD-10-CM

## 2015-05-08 DIAGNOSIS — Z953 Presence of xenogenic heart valve: Secondary | ICD-10-CM

## 2015-05-08 DIAGNOSIS — Z954 Presence of other heart-valve replacement: Secondary | ICD-10-CM

## 2015-05-08 DIAGNOSIS — I4891 Unspecified atrial fibrillation: Secondary | ICD-10-CM | POA: Diagnosis not present

## 2015-05-08 LAB — POCT INR: INR: 2.4

## 2015-05-09 DIAGNOSIS — E782 Mixed hyperlipidemia: Secondary | ICD-10-CM | POA: Diagnosis not present

## 2015-05-09 DIAGNOSIS — I4891 Unspecified atrial fibrillation: Secondary | ICD-10-CM | POA: Diagnosis not present

## 2015-05-09 DIAGNOSIS — M1990 Unspecified osteoarthritis, unspecified site: Secondary | ICD-10-CM | POA: Diagnosis not present

## 2015-05-09 DIAGNOSIS — I10 Essential (primary) hypertension: Secondary | ICD-10-CM | POA: Diagnosis not present

## 2015-05-22 DIAGNOSIS — H3531 Nonexudative age-related macular degeneration: Secondary | ICD-10-CM | POA: Diagnosis not present

## 2015-05-22 DIAGNOSIS — H3532 Exudative age-related macular degeneration: Secondary | ICD-10-CM | POA: Diagnosis not present

## 2015-05-22 DIAGNOSIS — H5440 Blindness, one eye, unspecified eye: Secondary | ICD-10-CM | POA: Diagnosis not present

## 2015-06-12 ENCOUNTER — Ambulatory Visit (INDEPENDENT_AMBULATORY_CARE_PROVIDER_SITE_OTHER): Payer: Medicare Other | Admitting: *Deleted

## 2015-06-12 DIAGNOSIS — Z954 Presence of other heart-valve replacement: Secondary | ICD-10-CM

## 2015-06-12 DIAGNOSIS — Z953 Presence of xenogenic heart valve: Secondary | ICD-10-CM

## 2015-06-12 DIAGNOSIS — I4891 Unspecified atrial fibrillation: Secondary | ICD-10-CM

## 2015-06-12 DIAGNOSIS — Z952 Presence of prosthetic heart valve: Secondary | ICD-10-CM

## 2015-06-12 LAB — POCT INR: INR: 2.7

## 2015-06-20 DIAGNOSIS — M25562 Pain in left knee: Secondary | ICD-10-CM | POA: Diagnosis not present

## 2015-06-26 DIAGNOSIS — H3532 Exudative age-related macular degeneration: Secondary | ICD-10-CM | POA: Diagnosis not present

## 2015-07-24 ENCOUNTER — Ambulatory Visit (INDEPENDENT_AMBULATORY_CARE_PROVIDER_SITE_OTHER): Payer: Medicare Other | Admitting: *Deleted

## 2015-07-24 DIAGNOSIS — Z954 Presence of other heart-valve replacement: Secondary | ICD-10-CM

## 2015-07-24 DIAGNOSIS — Z953 Presence of xenogenic heart valve: Secondary | ICD-10-CM

## 2015-07-24 DIAGNOSIS — Z952 Presence of prosthetic heart valve: Secondary | ICD-10-CM

## 2015-07-24 DIAGNOSIS — I4891 Unspecified atrial fibrillation: Secondary | ICD-10-CM | POA: Diagnosis not present

## 2015-07-24 LAB — POCT INR: INR: 3.8

## 2015-07-31 DIAGNOSIS — H353211 Exudative age-related macular degeneration, right eye, with active choroidal neovascularization: Secondary | ICD-10-CM | POA: Diagnosis not present

## 2015-08-01 DIAGNOSIS — Z23 Encounter for immunization: Secondary | ICD-10-CM | POA: Diagnosis not present

## 2015-08-21 ENCOUNTER — Ambulatory Visit (INDEPENDENT_AMBULATORY_CARE_PROVIDER_SITE_OTHER): Payer: Medicare Other | Admitting: Surgery

## 2015-08-21 DIAGNOSIS — Z954 Presence of other heart-valve replacement: Secondary | ICD-10-CM | POA: Diagnosis not present

## 2015-08-21 DIAGNOSIS — Z952 Presence of prosthetic heart valve: Secondary | ICD-10-CM

## 2015-08-21 DIAGNOSIS — I4891 Unspecified atrial fibrillation: Secondary | ICD-10-CM

## 2015-08-21 DIAGNOSIS — Z953 Presence of xenogenic heart valve: Secondary | ICD-10-CM

## 2015-08-21 LAB — POCT INR: INR: 3.8

## 2015-09-04 ENCOUNTER — Ambulatory Visit (INDEPENDENT_AMBULATORY_CARE_PROVIDER_SITE_OTHER): Payer: Medicare Other | Admitting: *Deleted

## 2015-09-04 DIAGNOSIS — Z952 Presence of prosthetic heart valve: Secondary | ICD-10-CM

## 2015-09-04 DIAGNOSIS — I4891 Unspecified atrial fibrillation: Secondary | ICD-10-CM

## 2015-09-04 DIAGNOSIS — Z953 Presence of xenogenic heart valve: Secondary | ICD-10-CM

## 2015-09-04 DIAGNOSIS — Z954 Presence of other heart-valve replacement: Secondary | ICD-10-CM

## 2015-09-04 DIAGNOSIS — H353211 Exudative age-related macular degeneration, right eye, with active choroidal neovascularization: Secondary | ICD-10-CM | POA: Diagnosis not present

## 2015-09-04 LAB — POCT INR: INR: 3.8

## 2015-09-18 ENCOUNTER — Ambulatory Visit (INDEPENDENT_AMBULATORY_CARE_PROVIDER_SITE_OTHER): Payer: Medicare Other | Admitting: *Deleted

## 2015-09-18 DIAGNOSIS — Z954 Presence of other heart-valve replacement: Secondary | ICD-10-CM | POA: Diagnosis not present

## 2015-09-18 DIAGNOSIS — Z953 Presence of xenogenic heart valve: Secondary | ICD-10-CM

## 2015-09-18 DIAGNOSIS — I4891 Unspecified atrial fibrillation: Secondary | ICD-10-CM

## 2015-09-18 DIAGNOSIS — Z952 Presence of prosthetic heart valve: Secondary | ICD-10-CM

## 2015-09-18 LAB — POCT INR: INR: 3.1

## 2015-10-03 ENCOUNTER — Ambulatory Visit (INDEPENDENT_AMBULATORY_CARE_PROVIDER_SITE_OTHER): Payer: Medicare Other | Admitting: *Deleted

## 2015-10-03 DIAGNOSIS — Z954 Presence of other heart-valve replacement: Secondary | ICD-10-CM

## 2015-10-03 DIAGNOSIS — Z953 Presence of xenogenic heart valve: Secondary | ICD-10-CM

## 2015-10-03 DIAGNOSIS — I4891 Unspecified atrial fibrillation: Secondary | ICD-10-CM | POA: Diagnosis not present

## 2015-10-03 DIAGNOSIS — Z952 Presence of prosthetic heart valve: Secondary | ICD-10-CM

## 2015-10-03 LAB — POCT INR: INR: 2.3

## 2015-10-15 DIAGNOSIS — H43811 Vitreous degeneration, right eye: Secondary | ICD-10-CM | POA: Diagnosis not present

## 2015-10-15 DIAGNOSIS — H353211 Exudative age-related macular degeneration, right eye, with active choroidal neovascularization: Secondary | ICD-10-CM | POA: Diagnosis not present

## 2015-10-24 ENCOUNTER — Ambulatory Visit (INDEPENDENT_AMBULATORY_CARE_PROVIDER_SITE_OTHER): Payer: Medicare Other | Admitting: *Deleted

## 2015-10-24 DIAGNOSIS — Z954 Presence of other heart-valve replacement: Secondary | ICD-10-CM | POA: Diagnosis not present

## 2015-10-24 DIAGNOSIS — Z952 Presence of prosthetic heart valve: Secondary | ICD-10-CM

## 2015-10-24 DIAGNOSIS — Z953 Presence of xenogenic heart valve: Secondary | ICD-10-CM

## 2015-10-24 DIAGNOSIS — I4891 Unspecified atrial fibrillation: Secondary | ICD-10-CM | POA: Diagnosis not present

## 2015-10-24 LAB — POCT INR: INR: 2.8

## 2015-11-08 DIAGNOSIS — I10 Essential (primary) hypertension: Secondary | ICD-10-CM | POA: Diagnosis not present

## 2015-11-08 DIAGNOSIS — E782 Mixed hyperlipidemia: Secondary | ICD-10-CM | POA: Diagnosis not present

## 2015-11-13 DIAGNOSIS — H353211 Exudative age-related macular degeneration, right eye, with active choroidal neovascularization: Secondary | ICD-10-CM | POA: Diagnosis not present

## 2015-11-13 DIAGNOSIS — H401134 Primary open-angle glaucoma, bilateral, indeterminate stage: Secondary | ICD-10-CM | POA: Diagnosis not present

## 2015-11-13 DIAGNOSIS — H524 Presbyopia: Secondary | ICD-10-CM | POA: Diagnosis not present

## 2015-11-14 DIAGNOSIS — H353211 Exudative age-related macular degeneration, right eye, with active choroidal neovascularization: Secondary | ICD-10-CM | POA: Diagnosis not present

## 2015-11-14 DIAGNOSIS — H401134 Primary open-angle glaucoma, bilateral, indeterminate stage: Secondary | ICD-10-CM | POA: Diagnosis not present

## 2015-11-14 DIAGNOSIS — H353223 Exudative age-related macular degeneration, left eye, with inactive scar: Secondary | ICD-10-CM | POA: Diagnosis not present

## 2015-11-18 DIAGNOSIS — H353134 Nonexudative age-related macular degeneration, bilateral, advanced atrophic with subfoveal involvement: Secondary | ICD-10-CM | POA: Diagnosis not present

## 2015-11-18 DIAGNOSIS — H353211 Exudative age-related macular degeneration, right eye, with active choroidal neovascularization: Secondary | ICD-10-CM | POA: Diagnosis not present

## 2015-11-21 ENCOUNTER — Ambulatory Visit (INDEPENDENT_AMBULATORY_CARE_PROVIDER_SITE_OTHER): Payer: Medicare Other | Admitting: *Deleted

## 2015-11-21 DIAGNOSIS — Z952 Presence of prosthetic heart valve: Secondary | ICD-10-CM

## 2015-11-21 DIAGNOSIS — I4891 Unspecified atrial fibrillation: Secondary | ICD-10-CM | POA: Diagnosis not present

## 2015-11-21 DIAGNOSIS — Z954 Presence of other heart-valve replacement: Secondary | ICD-10-CM | POA: Diagnosis not present

## 2015-11-21 DIAGNOSIS — Z953 Presence of xenogenic heart valve: Secondary | ICD-10-CM

## 2015-11-21 LAB — POCT INR: INR: 2.4

## 2015-12-02 DIAGNOSIS — H353211 Exudative age-related macular degeneration, right eye, with active choroidal neovascularization: Secondary | ICD-10-CM | POA: Diagnosis not present

## 2015-12-06 ENCOUNTER — Other Ambulatory Visit: Payer: Self-pay | Admitting: *Deleted

## 2015-12-06 MED ORDER — WARFARIN SODIUM 5 MG PO TABS: ORAL_TABLET | ORAL | Status: DC

## 2015-12-06 NOTE — Telephone Encounter (Signed)
Refill done as requested 

## 2015-12-13 DIAGNOSIS — N401 Enlarged prostate with lower urinary tract symptoms: Secondary | ICD-10-CM | POA: Diagnosis not present

## 2015-12-13 DIAGNOSIS — Z0000 Encounter for general adult medical examination without abnormal findings: Secondary | ICD-10-CM | POA: Diagnosis not present

## 2015-12-13 DIAGNOSIS — N138 Other obstructive and reflux uropathy: Secondary | ICD-10-CM | POA: Diagnosis not present

## 2015-12-13 DIAGNOSIS — N433 Hydrocele, unspecified: Secondary | ICD-10-CM | POA: Diagnosis not present

## 2015-12-16 DIAGNOSIS — H353211 Exudative age-related macular degeneration, right eye, with active choroidal neovascularization: Secondary | ICD-10-CM | POA: Diagnosis not present

## 2016-01-02 ENCOUNTER — Ambulatory Visit (INDEPENDENT_AMBULATORY_CARE_PROVIDER_SITE_OTHER): Payer: Medicare Other | Admitting: *Deleted

## 2016-01-02 DIAGNOSIS — I4891 Unspecified atrial fibrillation: Secondary | ICD-10-CM | POA: Diagnosis not present

## 2016-01-02 DIAGNOSIS — Z954 Presence of other heart-valve replacement: Secondary | ICD-10-CM | POA: Diagnosis not present

## 2016-01-02 DIAGNOSIS — Z952 Presence of prosthetic heart valve: Secondary | ICD-10-CM

## 2016-01-02 DIAGNOSIS — Z953 Presence of xenogenic heart valve: Secondary | ICD-10-CM

## 2016-01-02 LAB — POCT INR: INR: 2.4

## 2016-01-06 DIAGNOSIS — H26491 Other secondary cataract, right eye: Secondary | ICD-10-CM | POA: Diagnosis not present

## 2016-01-15 DIAGNOSIS — H353134 Nonexudative age-related macular degeneration, bilateral, advanced atrophic with subfoveal involvement: Secondary | ICD-10-CM | POA: Diagnosis not present

## 2016-01-15 DIAGNOSIS — H353211 Exudative age-related macular degeneration, right eye, with active choroidal neovascularization: Secondary | ICD-10-CM | POA: Diagnosis not present

## 2016-02-13 ENCOUNTER — Ambulatory Visit (INDEPENDENT_AMBULATORY_CARE_PROVIDER_SITE_OTHER): Payer: Medicare Other | Admitting: *Deleted

## 2016-02-13 DIAGNOSIS — Z954 Presence of other heart-valve replacement: Secondary | ICD-10-CM

## 2016-02-13 DIAGNOSIS — I4891 Unspecified atrial fibrillation: Secondary | ICD-10-CM | POA: Diagnosis not present

## 2016-02-13 DIAGNOSIS — Z952 Presence of prosthetic heart valve: Secondary | ICD-10-CM

## 2016-02-13 DIAGNOSIS — Z953 Presence of xenogenic heart valve: Secondary | ICD-10-CM

## 2016-02-13 LAB — POCT INR: INR: 2.4

## 2016-02-19 DIAGNOSIS — H353211 Exudative age-related macular degeneration, right eye, with active choroidal neovascularization: Secondary | ICD-10-CM | POA: Diagnosis not present

## 2016-03-23 DIAGNOSIS — L821 Other seborrheic keratosis: Secondary | ICD-10-CM | POA: Diagnosis not present

## 2016-03-23 DIAGNOSIS — L570 Actinic keratosis: Secondary | ICD-10-CM | POA: Diagnosis not present

## 2016-03-23 DIAGNOSIS — L820 Inflamed seborrheic keratosis: Secondary | ICD-10-CM | POA: Diagnosis not present

## 2016-03-23 DIAGNOSIS — D1801 Hemangioma of skin and subcutaneous tissue: Secondary | ICD-10-CM | POA: Diagnosis not present

## 2016-03-25 DIAGNOSIS — H353211 Exudative age-related macular degeneration, right eye, with active choroidal neovascularization: Secondary | ICD-10-CM | POA: Diagnosis not present

## 2016-03-26 ENCOUNTER — Ambulatory Visit (INDEPENDENT_AMBULATORY_CARE_PROVIDER_SITE_OTHER): Payer: Medicare Other | Admitting: *Deleted

## 2016-03-26 DIAGNOSIS — Z954 Presence of other heart-valve replacement: Secondary | ICD-10-CM

## 2016-03-26 DIAGNOSIS — Z952 Presence of prosthetic heart valve: Secondary | ICD-10-CM

## 2016-03-26 DIAGNOSIS — I4891 Unspecified atrial fibrillation: Secondary | ICD-10-CM | POA: Diagnosis not present

## 2016-03-26 DIAGNOSIS — Z953 Presence of xenogenic heart valve: Secondary | ICD-10-CM

## 2016-03-26 LAB — POCT INR: INR: 2.1

## 2016-04-23 DIAGNOSIS — H353211 Exudative age-related macular degeneration, right eye, with active choroidal neovascularization: Secondary | ICD-10-CM | POA: Diagnosis not present

## 2016-05-07 ENCOUNTER — Ambulatory Visit (INDEPENDENT_AMBULATORY_CARE_PROVIDER_SITE_OTHER): Payer: Medicare Other | Admitting: *Deleted

## 2016-05-07 DIAGNOSIS — Z952 Presence of prosthetic heart valve: Secondary | ICD-10-CM

## 2016-05-07 DIAGNOSIS — I4891 Unspecified atrial fibrillation: Secondary | ICD-10-CM

## 2016-05-07 DIAGNOSIS — Z954 Presence of other heart-valve replacement: Secondary | ICD-10-CM | POA: Diagnosis not present

## 2016-05-07 DIAGNOSIS — Z953 Presence of xenogenic heart valve: Secondary | ICD-10-CM

## 2016-05-07 LAB — POCT INR: INR: 2.8

## 2016-05-08 DIAGNOSIS — I10 Essential (primary) hypertension: Secondary | ICD-10-CM | POA: Diagnosis not present

## 2016-05-08 DIAGNOSIS — E782 Mixed hyperlipidemia: Secondary | ICD-10-CM | POA: Diagnosis not present

## 2016-05-08 DIAGNOSIS — I4891 Unspecified atrial fibrillation: Secondary | ICD-10-CM | POA: Diagnosis not present

## 2016-05-27 DIAGNOSIS — H353134 Nonexudative age-related macular degeneration, bilateral, advanced atrophic with subfoveal involvement: Secondary | ICD-10-CM | POA: Diagnosis not present

## 2016-05-27 DIAGNOSIS — H353211 Exudative age-related macular degeneration, right eye, with active choroidal neovascularization: Secondary | ICD-10-CM | POA: Diagnosis not present

## 2016-06-18 ENCOUNTER — Ambulatory Visit (INDEPENDENT_AMBULATORY_CARE_PROVIDER_SITE_OTHER): Payer: Medicare Other | Admitting: *Deleted

## 2016-06-18 DIAGNOSIS — Z952 Presence of prosthetic heart valve: Secondary | ICD-10-CM

## 2016-06-18 DIAGNOSIS — Z953 Presence of xenogenic heart valve: Secondary | ICD-10-CM

## 2016-06-18 DIAGNOSIS — Z954 Presence of other heart-valve replacement: Secondary | ICD-10-CM | POA: Diagnosis not present

## 2016-06-18 DIAGNOSIS — I4891 Unspecified atrial fibrillation: Secondary | ICD-10-CM

## 2016-06-18 LAB — POCT INR: INR: 2.6

## 2016-07-01 DIAGNOSIS — H353211 Exudative age-related macular degeneration, right eye, with active choroidal neovascularization: Secondary | ICD-10-CM | POA: Diagnosis not present

## 2016-07-01 DIAGNOSIS — H4051X3 Glaucoma secondary to other eye disorders, right eye, severe stage: Secondary | ICD-10-CM | POA: Diagnosis not present

## 2016-07-06 DIAGNOSIS — Z23 Encounter for immunization: Secondary | ICD-10-CM | POA: Diagnosis not present

## 2016-07-14 ENCOUNTER — Other Ambulatory Visit: Payer: Self-pay | Admitting: Interventional Cardiology

## 2016-07-30 ENCOUNTER — Ambulatory Visit (INDEPENDENT_AMBULATORY_CARE_PROVIDER_SITE_OTHER): Payer: Medicare Other | Admitting: *Deleted

## 2016-07-30 DIAGNOSIS — Z952 Presence of prosthetic heart valve: Secondary | ICD-10-CM | POA: Diagnosis not present

## 2016-07-30 DIAGNOSIS — I4891 Unspecified atrial fibrillation: Secondary | ICD-10-CM | POA: Diagnosis not present

## 2016-07-30 DIAGNOSIS — Z953 Presence of xenogenic heart valve: Secondary | ICD-10-CM | POA: Diagnosis not present

## 2016-07-30 LAB — POCT INR: INR: 3.4

## 2016-08-04 ENCOUNTER — Encounter: Payer: Self-pay | Admitting: Interventional Cardiology

## 2016-08-05 DIAGNOSIS — H353134 Nonexudative age-related macular degeneration, bilateral, advanced atrophic with subfoveal involvement: Secondary | ICD-10-CM | POA: Diagnosis not present

## 2016-08-05 DIAGNOSIS — H353211 Exudative age-related macular degeneration, right eye, with active choroidal neovascularization: Secondary | ICD-10-CM | POA: Diagnosis not present

## 2016-08-17 ENCOUNTER — Ambulatory Visit (INDEPENDENT_AMBULATORY_CARE_PROVIDER_SITE_OTHER): Payer: Medicare Other

## 2016-08-17 ENCOUNTER — Ambulatory Visit (INDEPENDENT_AMBULATORY_CARE_PROVIDER_SITE_OTHER): Payer: Medicare Other | Admitting: Interventional Cardiology

## 2016-08-17 ENCOUNTER — Encounter: Payer: Self-pay | Admitting: Interventional Cardiology

## 2016-08-17 VITALS — BP 124/80 | HR 79 | Ht 68.0 in | Wt 174.9 lb

## 2016-08-17 DIAGNOSIS — I481 Persistent atrial fibrillation: Secondary | ICD-10-CM | POA: Diagnosis not present

## 2016-08-17 DIAGNOSIS — Z7901 Long term (current) use of anticoagulants: Secondary | ICD-10-CM

## 2016-08-17 DIAGNOSIS — Z953 Presence of xenogenic heart valve: Secondary | ICD-10-CM

## 2016-08-17 DIAGNOSIS — I4891 Unspecified atrial fibrillation: Secondary | ICD-10-CM

## 2016-08-17 DIAGNOSIS — I4819 Other persistent atrial fibrillation: Secondary | ICD-10-CM

## 2016-08-17 DIAGNOSIS — Z952 Presence of prosthetic heart valve: Secondary | ICD-10-CM | POA: Diagnosis not present

## 2016-08-17 LAB — POCT INR: INR: 2.3

## 2016-08-17 NOTE — Progress Notes (Signed)
Cardiology Office Note   Date:  08/17/2016   ID:  Jason [name], DOB [birth year], MRN [id number]  PCP:  Shirline Frees, MD    No chief complaint on file. MVR, AVR   Wt Readings from Last 3 Encounters:  08/17/16 79.3 kg (174 lb 14.4 oz)  01/16/15 79.4 kg (175 lb 1.9 oz)  08/27/14 81.6 kg (180 lb)       History of Present Illness: Jason [name] is a 82 y.o. male  who has had valvular heart disease and atrial fibrillation. He underwent aortic valve and mitral valve replacement in 11/14. He has been doing well.  He had some swelling in his legs.  He was initially supposed to have a Maze procedure along with his valve surgery. Due to scar tissue from his initial surgery 9 years ago, he did not have a maze done. He has been rate controlled. He denies any palpitations. His only chest pain is if he coughs. He does not have pain at any other time. His breathing is okay. He has resumed walking. He finished cardiac rehabilitation.  He was told his kidney function was abnormal in 2016.    He has intentionally lost weight in 2016 through portion control and exercise. He as maintain this weight loss. He has not been walking as much as he did last year. He noticed a bulge on the right side of his abdomen which he was told was a hernia. It is not painful.     Past Medical History:  Diagnosis Date  . AK (actinic keratosis)   . Atrial fibrillation, persistent (HCC)    chronic coumadin therapy  . Blindness    left eye  . Elevated PSA   . GERD (gastroesophageal reflux disease)   . H/O hiatal hernia   . Heart murmur   . Hydrocele    left  . Hyperlipidemia   . Hypertension   . Internal hemorrhoids   . Kidney stones   . Macular degeneration    right eye  . Mitral regurgitation 02/25/2011   Recurrent MR s/p mitral valve repair   . OA (osteoarthritis) of neck   . S/P aortic valve replacement with bioprosthetic valve 08/15/2013   11mm Edwards Magna Ease bovine pericardial tissue valve   . S/P mitral valve repair 09/15/2004   Complex valvuloplasty including artificial Goretex neocord placement x4 and 73mm SARP ring annuloplasty via right mini thoracotomy approach - Dr Evelina Dun @ [facility]  . S/P redo mitral valve replacement and aortic valve replacement with bioprosthetic valves 08/15/2013   49mm Edwards Magna Mitral bovine pericardial tissue valve  . Severe aortic stenosis 11/14   tissue AVR   . Vocal cord paralysis     Past Surgical History:  Procedure Laterality Date  . AORTIC VALVE REPLACEMENT N/A 08/15/2013   Procedure: AORTIC VALVE REPLACEMENT (AVR);  Surgeon: Rexene Alberts, MD;  Location: [place];  Service: Open Heart Surgery;  Laterality: N/A;  . ESOPHAGOGASTRODUODENOSCOPY N/A 08/02/2013   Procedure: ESOPHAGOGASTRODUODENOSCOPY (EGD);  Surgeon: Casandra Doffing, MD;  Location: [facility] ENDOSCOPY;  Service: Cardiovascular;  Laterality: N/A;  . EYE SURGERY Right    infection  . GROIN MASS OPEN BIOPSY  2012  . HEMORRHOID SURGERY    . INGUINAL HERNIA REPAIR  08/2011  . INTRAOPERATIVE TRANSESOPHAGEAL ECHOCARDIOGRAM N/A 08/15/2013   Procedure: INTRAOPERATIVE TRANSESOPHAGEAL ECHOCARDIOGRAM;  Surgeon: Rexene Alberts, MD;  Location: [place];  Service: Open Heart Surgery;  Laterality: N/A;  . LEFT AND RIGHT HEART CATHETERIZATION WITH  CORONARY ANGIOGRAM N/A 07/05/2013   Procedure: LEFT AND RIGHT HEART CATHETERIZATION WITH CORONARY ANGIOGRAM;  Surgeon: Jettie Booze, MD;  Location: [facility] CATH LAB;  Service: Cardiovascular;  Laterality: N/A;  . MITRAL VALVE REPAIR  09/16/2011   @ [facility], Dr Evelina Dun  . MITRAL VALVE REPAIR N/A 08/15/2013   Procedure: REDO MITRAL VALVE (MV) REPLACEMENT;  Surgeon: Rexene Alberts, MD;  Location: [place];  Service: Open Heart Surgery;  Laterality: N/A;  . TEE WITHOUT CARDIOVERSION N/A 07/05/2013   Procedure: TRANSESOPHAGEAL ECHOCARDIOGRAM (TEE);  Surgeon: Casandra Doffing, MD;  Location: [facility] ENDOSCOPY;  Service: Cardiovascular;  Laterality: N/A;  . TEE WITHOUT CARDIOVERSION N/A  08/02/2013   Procedure: TRANSESOPHAGEAL ECHOCARDIOGRAM (TEE);  Surgeon: Casandra Doffing, MD;  Location: [facility] ENDOSCOPY;  Service: Cardiovascular;  Laterality: N/A;     Current Outpatient Prescriptions  Medication Sig Dispense Refill  . acetaminophen (TYLENOL) 650 MG CR tablet Take 1,300 mg by mouth 2 (two) times daily.     Marland Kitchen atorvastatin (LIPITOR) 20 MG tablet Take 20 mg by mouth at bedtime.     Marland Kitchen b complex vitamins tablet Take 1 tablet by mouth daily.    Marland Kitchen CARTIA XT 240 MG 24 hr capsule Take 240 mg by mouth every evening.     . furosemide (LASIX) 40 MG tablet 40 mg in the am and 20 mg in the pm 30 tablet   . glucosamine-chondroitin 500-400 MG tablet Take 1 tablet by mouth 2 (two) times daily.     Marland Kitchen lisinopril (PRINIVIL,ZESTRIL) 10 MG tablet Take 10 mg by mouth daily.    . metoprolol tartrate (LOPRESSOR) 25 MG tablet TAKE 1/2 TABLET BY MOUTH TWICE DAILY 90 tablet 0  . metoprolol tartrate (LOPRESSOR) 25 MG tablet TAKE 1/2 TABLET BY MOUTH TWICE DAILY 30 tablet 11  . Multiple Vitamin (MULTIVITAMIN) capsule Take 1 capsule by mouth daily.    . Multiple Vitamins-Minerals (PRESERVISION AREDS PO) Take 1 tablet by mouth 2 (two) times daily.     . vitamin B-12 (CYANOCOBALAMIN) 1000 MCG tablet Take 1,000 mcg by mouth daily.    Marland Kitchen warfarin (COUMADIN) 5 MG tablet TAKE AS DIRECTED BY COUMADIN CLINIC 120 tablet 1   No current facility-administered medications for this visit.     Allergies:   Penicillins    Social History:  The patient  reports that he quit smoking about 37 years ago. His smoking use included Cigarettes. He quit after 8.00 years of use. He has never used smokeless tobacco. He reports that he does not drink alcohol or use drugs.   Family History:  The patient's family history includes Cancer in his father; Diabetes in his father and mother; Heart attack in his mother; Hyperlipidemia in his father and mother; Hypertension in his brother, father, and mother; Stroke in his father and mother.     ROS:  Please see the history of present illness.   Otherwise, review of systems are positive for leg swelling in right shin after injury.   All other systems are reviewed and negative.    PHYSICAL EXAM: VS:  BP 124/80   Pulse 79   Ht 5\' 8"  (1.727 m)   Wt 79.3 kg (174 lb 14.4 oz)   SpO2 97%   BMI 26.59 kg/m  , BMI Body mass index is 26.59 kg/m. GEN: Well nourished, well developed, in no acute distress  HEENT: normal  Neck: no JVD, carotid bruits, or masses Cardiac: irregular, normal rate; 1/6 systolic murmur- decreased from pre valve surgery, no rubs, or  gallops,no edema  Respiratory:  clear to auscultation bilaterally, normal work of breathing GI: soft, nontender, nondistended, + BS, Bulge on the right upper quadrant/flank area, nontender MS: no deformity or atrophy  Skin: warm and dry, no rash Neuro:  Strength and sensation are intact Psych: euthymic mood, full affect   EKG:   The ekg ordered today demonstrates AFib with controlled ventricular response   Recent Labs: No results found for requested labs within last 8760 hours.   Lipid Panel No results found for: CHOL, TRIG, HDL, CHOLHDL, VLDL, LDLCALC, LDLDIRECT   Other studies Reviewed: Additional studies/ records that were reviewed today with results demonstrating: EF 50% in 2014.   ASSESSMENT AND PLAN:  1. AFib: rate controlled.,  Warfarin for stroke prevention.  2. Anticoagulated: No bleeding problems.  3. Mitral regurgitation: Status post bioprosthetic mitral valve placement in November 2014. SBE prophylaxis. 4. Aortic stenosis: Status post bioprosthetic aortic valve replacement in November 2014.  SBE prophylaxis. 5. Edema: resolved, even with less Lasix. 6.  CKD: Improved with less diuretic.   Current medicines are reviewed at length with the patient today.  The patient concerns regarding his medicines were addressed.  The following changes have been made:  No change  Labs/ tests ordered today include:   Orders Placed This Encounter  Procedures  . EKG 12-Lead    Recommend 150 minutes/week of aerobic exercise Low fat, low carb, high fiber diet recommended  Disposition:   FU in 1 year   Signed, Larae Grooms, MD  08/17/2016 12:50 PM    [facility] [address], [place], [place]  [address] Phone: [phone]; Fax: [phone]

## 2016-08-17 NOTE — Patient Instructions (Signed)

## 2016-08-26 DIAGNOSIS — H353211 Exudative age-related macular degeneration, right eye, with active choroidal neovascularization: Secondary | ICD-10-CM | POA: Diagnosis not present

## 2016-09-09 DIAGNOSIS — H353211 Exudative age-related macular degeneration, right eye, with active choroidal neovascularization: Secondary | ICD-10-CM | POA: Diagnosis not present

## 2016-09-16 ENCOUNTER — Ambulatory Visit (INDEPENDENT_AMBULATORY_CARE_PROVIDER_SITE_OTHER): Payer: Medicare Other | Admitting: *Deleted

## 2016-09-16 DIAGNOSIS — Z953 Presence of xenogenic heart valve: Secondary | ICD-10-CM

## 2016-09-16 DIAGNOSIS — I4891 Unspecified atrial fibrillation: Secondary | ICD-10-CM | POA: Diagnosis not present

## 2016-09-16 DIAGNOSIS — Z952 Presence of prosthetic heart valve: Secondary | ICD-10-CM | POA: Diagnosis not present

## 2016-09-16 LAB — POCT INR: INR: 2.2

## 2016-09-30 DIAGNOSIS — H353211 Exudative age-related macular degeneration, right eye, with active choroidal neovascularization: Secondary | ICD-10-CM | POA: Diagnosis not present

## 2016-10-11 DIAGNOSIS — H6692 Otitis media, unspecified, left ear: Secondary | ICD-10-CM | POA: Diagnosis not present

## 2016-10-11 DIAGNOSIS — J069 Acute upper respiratory infection, unspecified: Secondary | ICD-10-CM | POA: Diagnosis not present

## 2016-10-14 ENCOUNTER — Ambulatory Visit (INDEPENDENT_AMBULATORY_CARE_PROVIDER_SITE_OTHER): Payer: Medicare Other | Admitting: *Deleted

## 2016-10-14 DIAGNOSIS — I4891 Unspecified atrial fibrillation: Secondary | ICD-10-CM

## 2016-10-14 DIAGNOSIS — Z952 Presence of prosthetic heart valve: Secondary | ICD-10-CM | POA: Diagnosis not present

## 2016-10-14 DIAGNOSIS — Z953 Presence of xenogenic heart valve: Secondary | ICD-10-CM

## 2016-10-14 DIAGNOSIS — H353211 Exudative age-related macular degeneration, right eye, with active choroidal neovascularization: Secondary | ICD-10-CM | POA: Diagnosis not present

## 2016-10-14 LAB — POCT INR: INR: 2.5

## 2016-10-22 DIAGNOSIS — H6692 Otitis media, unspecified, left ear: Secondary | ICD-10-CM | POA: Diagnosis not present

## 2016-10-22 DIAGNOSIS — H6982 Other specified disorders of Eustachian tube, left ear: Secondary | ICD-10-CM | POA: Diagnosis not present

## 2016-11-05 DIAGNOSIS — H353211 Exudative age-related macular degeneration, right eye, with active choroidal neovascularization: Secondary | ICD-10-CM | POA: Diagnosis not present

## 2016-11-25 ENCOUNTER — Ambulatory Visit (INDEPENDENT_AMBULATORY_CARE_PROVIDER_SITE_OTHER): Payer: Medicare Other | Admitting: *Deleted

## 2016-11-25 DIAGNOSIS — Z953 Presence of xenogenic heart valve: Secondary | ICD-10-CM

## 2016-11-25 DIAGNOSIS — H353211 Exudative age-related macular degeneration, right eye, with active choroidal neovascularization: Secondary | ICD-10-CM | POA: Diagnosis not present

## 2016-11-25 DIAGNOSIS — Z952 Presence of prosthetic heart valve: Secondary | ICD-10-CM

## 2016-11-25 DIAGNOSIS — I4891 Unspecified atrial fibrillation: Secondary | ICD-10-CM | POA: Diagnosis not present

## 2016-11-25 LAB — POCT INR: INR: 2.5

## 2016-12-10 DIAGNOSIS — H353211 Exudative age-related macular degeneration, right eye, with active choroidal neovascularization: Secondary | ICD-10-CM | POA: Diagnosis not present

## 2017-01-06 ENCOUNTER — Ambulatory Visit (INDEPENDENT_AMBULATORY_CARE_PROVIDER_SITE_OTHER): Payer: Medicare Other | Admitting: *Deleted

## 2017-01-06 DIAGNOSIS — Z953 Presence of xenogenic heart valve: Secondary | ICD-10-CM | POA: Diagnosis not present

## 2017-01-06 DIAGNOSIS — Z952 Presence of prosthetic heart valve: Secondary | ICD-10-CM

## 2017-01-06 DIAGNOSIS — H353211 Exudative age-related macular degeneration, right eye, with active choroidal neovascularization: Secondary | ICD-10-CM | POA: Diagnosis not present

## 2017-01-06 DIAGNOSIS — I4891 Unspecified atrial fibrillation: Secondary | ICD-10-CM

## 2017-01-06 LAB — POCT INR: INR: 2.9

## 2017-01-20 DIAGNOSIS — H353134 Nonexudative age-related macular degeneration, bilateral, advanced atrophic with subfoveal involvement: Secondary | ICD-10-CM | POA: Diagnosis not present

## 2017-01-20 DIAGNOSIS — H26491 Other secondary cataract, right eye: Secondary | ICD-10-CM | POA: Diagnosis not present

## 2017-01-20 DIAGNOSIS — H43811 Vitreous degeneration, right eye: Secondary | ICD-10-CM | POA: Diagnosis not present

## 2017-01-20 DIAGNOSIS — H353211 Exudative age-related macular degeneration, right eye, with active choroidal neovascularization: Secondary | ICD-10-CM | POA: Diagnosis not present

## 2017-01-26 DIAGNOSIS — I4891 Unspecified atrial fibrillation: Secondary | ICD-10-CM | POA: Diagnosis not present

## 2017-01-26 DIAGNOSIS — E782 Mixed hyperlipidemia: Secondary | ICD-10-CM | POA: Diagnosis not present

## 2017-01-26 DIAGNOSIS — I10 Essential (primary) hypertension: Secondary | ICD-10-CM | POA: Diagnosis not present

## 2017-02-03 DIAGNOSIS — N400 Enlarged prostate without lower urinary tract symptoms: Secondary | ICD-10-CM | POA: Diagnosis not present

## 2017-02-03 DIAGNOSIS — N433 Hydrocele, unspecified: Secondary | ICD-10-CM | POA: Diagnosis not present

## 2017-02-04 DIAGNOSIS — H353211 Exudative age-related macular degeneration, right eye, with active choroidal neovascularization: Secondary | ICD-10-CM | POA: Diagnosis not present

## 2017-02-05 DIAGNOSIS — Z1211 Encounter for screening for malignant neoplasm of colon: Secondary | ICD-10-CM | POA: Diagnosis not present

## 2017-02-17 ENCOUNTER — Ambulatory Visit (INDEPENDENT_AMBULATORY_CARE_PROVIDER_SITE_OTHER): Payer: Medicare Other | Admitting: *Deleted

## 2017-02-17 DIAGNOSIS — I4891 Unspecified atrial fibrillation: Secondary | ICD-10-CM | POA: Diagnosis not present

## 2017-02-17 DIAGNOSIS — Z953 Presence of xenogenic heart valve: Secondary | ICD-10-CM | POA: Diagnosis not present

## 2017-02-17 DIAGNOSIS — Z952 Presence of prosthetic heart valve: Secondary | ICD-10-CM

## 2017-02-17 LAB — POCT INR: INR: 2.7

## 2017-03-10 DIAGNOSIS — H353134 Nonexudative age-related macular degeneration, bilateral, advanced atrophic with subfoveal involvement: Secondary | ICD-10-CM | POA: Diagnosis not present

## 2017-03-10 DIAGNOSIS — H4051X3 Glaucoma secondary to other eye disorders, right eye, severe stage: Secondary | ICD-10-CM | POA: Diagnosis not present

## 2017-03-10 DIAGNOSIS — H353211 Exudative age-related macular degeneration, right eye, with active choroidal neovascularization: Secondary | ICD-10-CM | POA: Diagnosis not present

## 2017-03-10 DIAGNOSIS — H35351 Cystoid macular degeneration, right eye: Secondary | ICD-10-CM | POA: Diagnosis not present

## 2017-03-31 ENCOUNTER — Ambulatory Visit (INDEPENDENT_AMBULATORY_CARE_PROVIDER_SITE_OTHER): Payer: Medicare Other | Admitting: *Deleted

## 2017-03-31 DIAGNOSIS — I4891 Unspecified atrial fibrillation: Secondary | ICD-10-CM

## 2017-03-31 DIAGNOSIS — Z953 Presence of xenogenic heart valve: Secondary | ICD-10-CM

## 2017-03-31 DIAGNOSIS — Z952 Presence of prosthetic heart valve: Secondary | ICD-10-CM | POA: Diagnosis not present

## 2017-03-31 LAB — POCT INR: INR: 3

## 2017-04-15 DIAGNOSIS — H353113 Nonexudative age-related macular degeneration, right eye, advanced atrophic without subfoveal involvement: Secondary | ICD-10-CM | POA: Diagnosis not present

## 2017-04-15 DIAGNOSIS — H353124 Nonexudative age-related macular degeneration, left eye, advanced atrophic with subfoveal involvement: Secondary | ICD-10-CM | POA: Diagnosis not present

## 2017-04-15 DIAGNOSIS — H353211 Exudative age-related macular degeneration, right eye, with active choroidal neovascularization: Secondary | ICD-10-CM | POA: Diagnosis not present

## 2017-04-15 DIAGNOSIS — H35351 Cystoid macular degeneration, right eye: Secondary | ICD-10-CM | POA: Diagnosis not present

## 2017-05-05 DIAGNOSIS — L821 Other seborrheic keratosis: Secondary | ICD-10-CM | POA: Diagnosis not present

## 2017-05-05 DIAGNOSIS — L814 Other melanin hyperpigmentation: Secondary | ICD-10-CM | POA: Diagnosis not present

## 2017-05-05 DIAGNOSIS — D225 Melanocytic nevi of trunk: Secondary | ICD-10-CM | POA: Diagnosis not present

## 2017-05-05 DIAGNOSIS — L820 Inflamed seborrheic keratosis: Secondary | ICD-10-CM | POA: Diagnosis not present

## 2017-05-12 ENCOUNTER — Ambulatory Visit (INDEPENDENT_AMBULATORY_CARE_PROVIDER_SITE_OTHER): Payer: Medicare Other | Admitting: *Deleted

## 2017-05-12 DIAGNOSIS — Z952 Presence of prosthetic heart valve: Secondary | ICD-10-CM

## 2017-05-12 DIAGNOSIS — Z953 Presence of xenogenic heart valve: Secondary | ICD-10-CM

## 2017-05-12 DIAGNOSIS — I4891 Unspecified atrial fibrillation: Secondary | ICD-10-CM | POA: Diagnosis not present

## 2017-05-12 LAB — POCT INR: INR: 2.4

## 2017-05-20 DIAGNOSIS — H43811 Vitreous degeneration, right eye: Secondary | ICD-10-CM | POA: Diagnosis not present

## 2017-05-20 DIAGNOSIS — H35351 Cystoid macular degeneration, right eye: Secondary | ICD-10-CM | POA: Diagnosis not present

## 2017-05-20 DIAGNOSIS — H4051X3 Glaucoma secondary to other eye disorders, right eye, severe stage: Secondary | ICD-10-CM | POA: Diagnosis not present

## 2017-05-20 DIAGNOSIS — H353223 Exudative age-related macular degeneration, left eye, with inactive scar: Secondary | ICD-10-CM | POA: Diagnosis not present

## 2017-05-20 DIAGNOSIS — H353211 Exudative age-related macular degeneration, right eye, with active choroidal neovascularization: Secondary | ICD-10-CM | POA: Diagnosis not present

## 2017-06-23 ENCOUNTER — Ambulatory Visit (INDEPENDENT_AMBULATORY_CARE_PROVIDER_SITE_OTHER): Payer: Medicare Other | Admitting: *Deleted

## 2017-06-23 DIAGNOSIS — Z952 Presence of prosthetic heart valve: Secondary | ICD-10-CM | POA: Diagnosis not present

## 2017-06-23 DIAGNOSIS — I4891 Unspecified atrial fibrillation: Secondary | ICD-10-CM

## 2017-06-23 DIAGNOSIS — Z953 Presence of xenogenic heart valve: Secondary | ICD-10-CM

## 2017-06-23 LAB — POCT INR: INR: 3.7

## 2017-06-24 DIAGNOSIS — H43811 Vitreous degeneration, right eye: Secondary | ICD-10-CM | POA: Diagnosis not present

## 2017-06-24 DIAGNOSIS — H353223 Exudative age-related macular degeneration, left eye, with inactive scar: Secondary | ICD-10-CM | POA: Diagnosis not present

## 2017-06-24 DIAGNOSIS — H353124 Nonexudative age-related macular degeneration, left eye, advanced atrophic with subfoveal involvement: Secondary | ICD-10-CM | POA: Diagnosis not present

## 2017-06-24 DIAGNOSIS — H35351 Cystoid macular degeneration, right eye: Secondary | ICD-10-CM | POA: Diagnosis not present

## 2017-06-24 DIAGNOSIS — H353211 Exudative age-related macular degeneration, right eye, with active choroidal neovascularization: Secondary | ICD-10-CM | POA: Diagnosis not present

## 2017-07-27 ENCOUNTER — Ambulatory Visit (INDEPENDENT_AMBULATORY_CARE_PROVIDER_SITE_OTHER): Payer: Medicare Other | Admitting: *Deleted

## 2017-07-27 DIAGNOSIS — Z952 Presence of prosthetic heart valve: Secondary | ICD-10-CM

## 2017-07-27 DIAGNOSIS — I4891 Unspecified atrial fibrillation: Secondary | ICD-10-CM

## 2017-07-27 DIAGNOSIS — Z953 Presence of xenogenic heart valve: Secondary | ICD-10-CM

## 2017-07-27 LAB — POCT INR: INR: 3.5

## 2017-07-28 DIAGNOSIS — I340 Nonrheumatic mitral (valve) insufficiency: Secondary | ICD-10-CM | POA: Diagnosis not present

## 2017-07-28 DIAGNOSIS — Z23 Encounter for immunization: Secondary | ICD-10-CM | POA: Diagnosis not present

## 2017-07-28 DIAGNOSIS — I10 Essential (primary) hypertension: Secondary | ICD-10-CM | POA: Diagnosis not present

## 2017-07-28 DIAGNOSIS — I4891 Unspecified atrial fibrillation: Secondary | ICD-10-CM | POA: Diagnosis not present

## 2017-07-28 DIAGNOSIS — E782 Mixed hyperlipidemia: Secondary | ICD-10-CM | POA: Diagnosis not present

## 2017-07-29 DIAGNOSIS — H353211 Exudative age-related macular degeneration, right eye, with active choroidal neovascularization: Secondary | ICD-10-CM | POA: Diagnosis not present

## 2017-07-29 DIAGNOSIS — H4051X3 Glaucoma secondary to other eye disorders, right eye, severe stage: Secondary | ICD-10-CM | POA: Diagnosis not present

## 2017-07-29 DIAGNOSIS — H353223 Exudative age-related macular degeneration, left eye, with inactive scar: Secondary | ICD-10-CM | POA: Diagnosis not present

## 2017-07-29 DIAGNOSIS — H35351 Cystoid macular degeneration, right eye: Secondary | ICD-10-CM | POA: Diagnosis not present

## 2017-08-03 ENCOUNTER — Encounter: Payer: Self-pay | Admitting: Interventional Cardiology

## 2017-08-11 ENCOUNTER — Ambulatory Visit (INDEPENDENT_AMBULATORY_CARE_PROVIDER_SITE_OTHER): Payer: Medicare Other | Admitting: *Deleted

## 2017-08-11 DIAGNOSIS — Z952 Presence of prosthetic heart valve: Secondary | ICD-10-CM

## 2017-08-11 DIAGNOSIS — Z5181 Encounter for therapeutic drug level monitoring: Secondary | ICD-10-CM

## 2017-08-11 DIAGNOSIS — Z953 Presence of xenogenic heart valve: Secondary | ICD-10-CM | POA: Diagnosis not present

## 2017-08-11 DIAGNOSIS — I4891 Unspecified atrial fibrillation: Secondary | ICD-10-CM | POA: Diagnosis not present

## 2017-08-11 LAB — POCT INR: INR: 2.6

## 2017-08-18 DIAGNOSIS — K458 Other specified abdominal hernia without obstruction or gangrene: Secondary | ICD-10-CM | POA: Diagnosis not present

## 2017-08-19 ENCOUNTER — Ambulatory Visit (INDEPENDENT_AMBULATORY_CARE_PROVIDER_SITE_OTHER): Payer: Medicare Other | Admitting: Interventional Cardiology

## 2017-08-19 ENCOUNTER — Encounter: Payer: Self-pay | Admitting: Interventional Cardiology

## 2017-08-19 VITALS — BP 128/84 | HR 78 | Ht 68.0 in | Wt 174.8 lb

## 2017-08-19 DIAGNOSIS — Z7901 Long term (current) use of anticoagulants: Secondary | ICD-10-CM | POA: Diagnosis not present

## 2017-08-19 DIAGNOSIS — R600 Localized edema: Secondary | ICD-10-CM

## 2017-08-19 DIAGNOSIS — I481 Persistent atrial fibrillation: Secondary | ICD-10-CM

## 2017-08-19 DIAGNOSIS — I4819 Other persistent atrial fibrillation: Secondary | ICD-10-CM

## 2017-08-19 DIAGNOSIS — Z952 Presence of prosthetic heart valve: Secondary | ICD-10-CM | POA: Diagnosis not present

## 2017-08-19 NOTE — Progress Notes (Signed)
Cardiology Office Note   Date:  08/19/2017   ID:  Jason [name], DOB [birth year], MRN [id number]  PCP:  Shirline Frees, MD    No chief complaint on file.  AFib  Wt Readings from Last 3 Encounters:  08/19/17 174 lb 12.8 oz (79.3 kg)  08/17/16 174 lb 14.4 oz (79.3 kg)  01/16/15 175 lb 1.9 oz (79.4 kg)       History of Present Illness: Jason [name] is a 83 y.o. male   who has had valvular heart disease and atrial fibrillation. He underwent aortic valve and mitral valve replacement in 11/14.   He was initially supposed to have a Maze procedure along with his valve surgery. Due to scar tissue from his initial surgery 9 years ago, he did not have a maze done. He has been rate controlled I the past, and anticoagulated.   He was told his kidney function was abnormal in 2016.    He has intentionally lost weight in 2016 through portion control and exercise. He has maintain this weight loss.   He has an hernia as well.    Past Medical History:  Diagnosis Date  . AK (actinic keratosis)   . Atrial fibrillation, persistent (HCC)    chronic coumadin therapy  . Blindness    left eye  . Elevated PSA   . GERD (gastroesophageal reflux disease)   . H/O hiatal hernia   . Heart murmur   . Hydrocele    left  . Hyperlipidemia   . Hypertension   . Internal hemorrhoids   . Kidney stones   . Macular degeneration    right eye  . Mitral regurgitation 02/25/2011   Recurrent MR s/p mitral valve repair   . OA (osteoarthritis) of neck   . S/P aortic valve replacement with bioprosthetic valve 08/15/2013   74mm Edwards Magna Ease bovine pericardial tissue valve  . S/P mitral valve repair 09/15/2004   Complex valvuloplasty including artificial Goretex neocord placement x4 and 55mm SARP ring annuloplasty via right mini thoracotomy approach - Dr Evelina Dun @ [facility]  . S/P redo mitral valve replacement and aortic valve replacement with bioprosthetic valves 08/15/2013   60mm Edwards Magna Mitral  bovine pericardial tissue valve  . Severe aortic stenosis 11/14   tissue AVR   . Vocal cord paralysis     Past Surgical History:  Procedure Laterality Date  . EYE SURGERY Right    infection  . GROIN MASS OPEN BIOPSY  2012  . HEMORRHOID SURGERY    . INGUINAL HERNIA REPAIR  08/2011  . MITRAL VALVE REPAIR  09/16/2011   @ [facility], Dr Evelina Dun     Current Outpatient Medications  Medication Sig Dispense Refill  . acetaminophen (TYLENOL) 650 MG CR tablet Take 1,300 mg by mouth 2 (two) times daily.     . ALPHAGAN P 0.1 % SOLN Place 1 drop 2 (two) times daily into both eyes.  3  . atorvastatin (LIPITOR) 20 MG tablet Take 20 mg by mouth at bedtime.     Marland Kitchen b complex vitamins tablet Take 1 tablet by mouth daily.    Marland Kitchen CARTIA XT 240 MG 24 hr capsule Take 240 mg by mouth every evening.     . furosemide (LASIX) 40 MG tablet 40 mg in the am and 20 mg in the pm 30 tablet   . glucosamine-chondroitin 500-400 MG tablet Take 1 tablet by mouth 2 (two) times daily.     Marland Kitchen lisinopril (PRINIVIL,ZESTRIL) 10 MG tablet  Take 10 mg by mouth daily.    . metoprolol tartrate (LOPRESSOR) 25 MG tablet TAKE 1/2 TABLET BY MOUTH TWICE DAILY 90 tablet 0  . metoprolol tartrate (LOPRESSOR) 25 MG tablet TAKE 1/2 TABLET BY MOUTH TWICE DAILY 30 tablet 11  . Multiple Vitamin (MULTIVITAMIN) capsule Take 1 capsule by mouth daily.    . Multiple Vitamins-Minerals (PRESERVISION AREDS PO) Take 1 tablet by mouth 2 (two) times daily.     . vitamin B-12 (CYANOCOBALAMIN) 1000 MCG tablet Take 1,000 mcg by mouth daily.    Marland Kitchen warfarin (COUMADIN) 5 MG tablet TAKE AS DIRECTED BY COUMADIN CLINIC 120 tablet 1   No current facility-administered medications for this visit.     Allergies:   Penicillins    Social History:  The patient  reports that he quit smoking about 38 years ago. His smoking use included cigarettes. He quit after 8.00 years of use. he has never used smokeless tobacco. He reports that he does not drink alcohol or use drugs.    Family History:  The patient's family history includes Cancer in his father; Diabetes in his father and mother; Heart attack in his mother; Hyperlipidemia in his father and mother; Hypertension in his brother, father, and mother; Stroke in his father and mother.    ROS:  Please see the history of present illness.   Otherwise, review of systems are positive for INR increased at times recently.   All other systems are reviewed and negative.    PHYSICAL EXAM: VS:  BP 128/84   Pulse 78   Ht 5\' 8"  (1.727 m)   Wt 174 lb 12.8 oz (79.3 kg)   SpO2 95%   BMI 26.58 kg/m  , BMI Body mass index is 26.58 kg/m. GEN: Well nourished, well developed, in no acute distress  HEENT: normal  Neck: no JVD, carotid bruits, or masses Cardiac: irregularly iregular; 2/6 systolic murmurs,no  rubs, or gallops,no edema  Respiratory:  clear to auscultation bilaterally, normal work of breathing GI: soft, nontender, nondistended, + BS MS: no deformity or atrophy  Skin: warm and dry, no rash Neuro:  Strength and sensation are intact Psych: euthymic mood, full affect   EKG:   The ekg ordered today demonstrates AFib, RBBB, LAFB, controlled ventricular rate   Recent Labs: No results found for requested labs within last 8760 hours.   Lipid Panel No results found for: CHOL, TRIG, HDL, CHOLHDL, VLDL, LDLCALC, LDLDIRECT   Other studies Reviewed: Additional studies/ records that were reviewed today with results demonstrating: Lipids reviewed.  From October 2018 total cholesterol 156, HDL 53, LDL 78, triglycerides 123, creatinine 1.13, ALT 16, TSH 1.8.   ASSESSMENT AND PLAN:  1. AFib: rate controlled.  Warfarin for stroke prevention.  No bleeding problems. 2. Anticoagulated: Avoid falling. 3. Mitral regurgitation: Continue SBE prophylaxis.  No signs of CHF. 4. Aortic stenosis: treated with bioprosthetic valve.  No signs of aortic stenosis 5. LE edema: Resolved. 6. CKD: Avoid nephrotoxins.   Current  medicines are reviewed at length with the patient today.  The patient concerns regarding his medicines were addressed.  The following changes have been made:  No change  Labs/ tests ordered today include:  No orders of the defined types were placed in this encounter.   Recommend 150 minutes/week of aerobic exercise Low fat, low carb, high fiber diet recommended  Disposition:   FU in 1 year   Signed, Larae Grooms, MD  08/19/2017 2:42 PM    [facility]  [address] [place], [place]  [address] Phone: [phone]; Fax: [phone]

## 2017-08-19 NOTE — Patient Instructions (Signed)

## 2017-09-01 ENCOUNTER — Ambulatory Visit (INDEPENDENT_AMBULATORY_CARE_PROVIDER_SITE_OTHER): Payer: Medicare Other | Admitting: *Deleted

## 2017-09-01 DIAGNOSIS — I481 Persistent atrial fibrillation: Secondary | ICD-10-CM

## 2017-09-01 DIAGNOSIS — Z5181 Encounter for therapeutic drug level monitoring: Secondary | ICD-10-CM

## 2017-09-01 DIAGNOSIS — I4819 Other persistent atrial fibrillation: Secondary | ICD-10-CM

## 2017-09-01 DIAGNOSIS — Z953 Presence of xenogenic heart valve: Secondary | ICD-10-CM | POA: Diagnosis not present

## 2017-09-01 DIAGNOSIS — I4891 Unspecified atrial fibrillation: Secondary | ICD-10-CM

## 2017-09-01 DIAGNOSIS — Z952 Presence of prosthetic heart valve: Secondary | ICD-10-CM | POA: Diagnosis not present

## 2017-09-01 LAB — POCT INR: INR: 2.7

## 2017-09-01 NOTE — Patient Instructions (Signed)
Continue taking 1 tablet everyday. Recheck INR in 4 weeks. Coumadin Clinic number [phone]

## 2017-09-10 ENCOUNTER — Other Ambulatory Visit: Payer: Self-pay | Admitting: Surgery

## 2017-09-10 DIAGNOSIS — D171 Benign lipomatous neoplasm of skin and subcutaneous tissue of trunk: Secondary | ICD-10-CM | POA: Diagnosis not present

## 2017-09-15 DIAGNOSIS — H35351 Cystoid macular degeneration, right eye: Secondary | ICD-10-CM | POA: Diagnosis not present

## 2017-09-15 DIAGNOSIS — H353223 Exudative age-related macular degeneration, left eye, with inactive scar: Secondary | ICD-10-CM | POA: Diagnosis not present

## 2017-09-15 DIAGNOSIS — H4051X3 Glaucoma secondary to other eye disorders, right eye, severe stage: Secondary | ICD-10-CM | POA: Diagnosis not present

## 2017-09-15 DIAGNOSIS — H353211 Exudative age-related macular degeneration, right eye, with active choroidal neovascularization: Secondary | ICD-10-CM | POA: Diagnosis not present

## 2017-09-29 ENCOUNTER — Ambulatory Visit (INDEPENDENT_AMBULATORY_CARE_PROVIDER_SITE_OTHER): Payer: Medicare Other | Admitting: *Deleted

## 2017-09-29 DIAGNOSIS — Z5181 Encounter for therapeutic drug level monitoring: Secondary | ICD-10-CM | POA: Diagnosis not present

## 2017-09-29 DIAGNOSIS — Z952 Presence of prosthetic heart valve: Secondary | ICD-10-CM

## 2017-09-29 DIAGNOSIS — I481 Persistent atrial fibrillation: Secondary | ICD-10-CM | POA: Diagnosis not present

## 2017-09-29 DIAGNOSIS — Z953 Presence of xenogenic heart valve: Secondary | ICD-10-CM | POA: Diagnosis not present

## 2017-09-29 DIAGNOSIS — I4891 Unspecified atrial fibrillation: Secondary | ICD-10-CM | POA: Diagnosis not present

## 2017-09-29 DIAGNOSIS — I4819 Other persistent atrial fibrillation: Secondary | ICD-10-CM

## 2017-09-29 LAB — POCT INR: INR: 3

## 2017-09-29 NOTE — Patient Instructions (Signed)
Description   Continue taking 1 tablet every day. Recheck INR in 4 weeks. Coumadin Clinic [phone]    

## 2017-10-18 DIAGNOSIS — J029 Acute pharyngitis, unspecified: Secondary | ICD-10-CM | POA: Diagnosis not present

## 2017-10-18 DIAGNOSIS — J069 Acute upper respiratory infection, unspecified: Secondary | ICD-10-CM | POA: Diagnosis not present

## 2017-10-18 DIAGNOSIS — R0982 Postnasal drip: Secondary | ICD-10-CM | POA: Diagnosis not present

## 2017-10-27 ENCOUNTER — Ambulatory Visit (INDEPENDENT_AMBULATORY_CARE_PROVIDER_SITE_OTHER): Payer: Medicare Other | Admitting: *Deleted

## 2017-10-27 DIAGNOSIS — Z5181 Encounter for therapeutic drug level monitoring: Secondary | ICD-10-CM | POA: Diagnosis not present

## 2017-10-27 DIAGNOSIS — Z952 Presence of prosthetic heart valve: Secondary | ICD-10-CM

## 2017-10-27 DIAGNOSIS — I4891 Unspecified atrial fibrillation: Secondary | ICD-10-CM

## 2017-10-27 DIAGNOSIS — H353223 Exudative age-related macular degeneration, left eye, with inactive scar: Secondary | ICD-10-CM | POA: Diagnosis not present

## 2017-10-27 DIAGNOSIS — I481 Persistent atrial fibrillation: Secondary | ICD-10-CM

## 2017-10-27 DIAGNOSIS — H353211 Exudative age-related macular degeneration, right eye, with active choroidal neovascularization: Secondary | ICD-10-CM | POA: Diagnosis not present

## 2017-10-27 DIAGNOSIS — H35351 Cystoid macular degeneration, right eye: Secondary | ICD-10-CM | POA: Diagnosis not present

## 2017-10-27 DIAGNOSIS — Z953 Presence of xenogenic heart valve: Secondary | ICD-10-CM

## 2017-10-27 DIAGNOSIS — I4819 Other persistent atrial fibrillation: Secondary | ICD-10-CM

## 2017-10-27 DIAGNOSIS — H43811 Vitreous degeneration, right eye: Secondary | ICD-10-CM | POA: Diagnosis not present

## 2017-10-27 LAB — POCT INR: INR: 2.2

## 2017-10-27 NOTE — Patient Instructions (Signed)
Description   Continue taking 1 tablet everyday.  Recheck INR in 6 weeks. Coumadin Clinic [phone]     

## 2017-12-08 DIAGNOSIS — H353223 Exudative age-related macular degeneration, left eye, with inactive scar: Secondary | ICD-10-CM | POA: Diagnosis not present

## 2017-12-08 DIAGNOSIS — H353211 Exudative age-related macular degeneration, right eye, with active choroidal neovascularization: Secondary | ICD-10-CM | POA: Diagnosis not present

## 2017-12-08 DIAGNOSIS — H35351 Cystoid macular degeneration, right eye: Secondary | ICD-10-CM | POA: Diagnosis not present

## 2017-12-08 DIAGNOSIS — H353124 Nonexudative age-related macular degeneration, left eye, advanced atrophic with subfoveal involvement: Secondary | ICD-10-CM | POA: Diagnosis not present

## 2017-12-09 ENCOUNTER — Ambulatory Visit (INDEPENDENT_AMBULATORY_CARE_PROVIDER_SITE_OTHER): Payer: Medicare Other | Admitting: *Deleted

## 2017-12-09 DIAGNOSIS — Z952 Presence of prosthetic heart valve: Secondary | ICD-10-CM

## 2017-12-09 DIAGNOSIS — I4891 Unspecified atrial fibrillation: Secondary | ICD-10-CM | POA: Diagnosis not present

## 2017-12-09 DIAGNOSIS — I481 Persistent atrial fibrillation: Secondary | ICD-10-CM

## 2017-12-09 DIAGNOSIS — I4819 Other persistent atrial fibrillation: Secondary | ICD-10-CM

## 2017-12-09 DIAGNOSIS — Z953 Presence of xenogenic heart valve: Secondary | ICD-10-CM | POA: Diagnosis not present

## 2017-12-09 LAB — POCT INR: INR: 1.9

## 2017-12-09 NOTE — Patient Instructions (Signed)
Description   Today Feb 28th take 1 and 1/2 tablets (7.5mg ) then continue taking 1 tablet everyday. Recheck INR in 6 weeks. Coumadin Clinic [phone]

## 2018-01-19 DIAGNOSIS — H4051X3 Glaucoma secondary to other eye disorders, right eye, severe stage: Secondary | ICD-10-CM | POA: Diagnosis not present

## 2018-01-19 DIAGNOSIS — H353211 Exudative age-related macular degeneration, right eye, with active choroidal neovascularization: Secondary | ICD-10-CM | POA: Diagnosis not present

## 2018-01-19 DIAGNOSIS — H353223 Exudative age-related macular degeneration, left eye, with inactive scar: Secondary | ICD-10-CM | POA: Diagnosis not present

## 2018-01-19 DIAGNOSIS — H35351 Cystoid macular degeneration, right eye: Secondary | ICD-10-CM | POA: Diagnosis not present

## 2018-01-20 ENCOUNTER — Ambulatory Visit (INDEPENDENT_AMBULATORY_CARE_PROVIDER_SITE_OTHER): Payer: Medicare Other | Admitting: *Deleted

## 2018-01-20 DIAGNOSIS — Z953 Presence of xenogenic heart valve: Secondary | ICD-10-CM | POA: Diagnosis not present

## 2018-01-20 DIAGNOSIS — Z5181 Encounter for therapeutic drug level monitoring: Secondary | ICD-10-CM | POA: Insufficient documentation

## 2018-01-20 DIAGNOSIS — Z952 Presence of prosthetic heart valve: Secondary | ICD-10-CM

## 2018-01-20 DIAGNOSIS — I4891 Unspecified atrial fibrillation: Secondary | ICD-10-CM

## 2018-01-20 LAB — POCT INR: INR: 1.8

## 2018-01-20 NOTE — Patient Instructions (Addendum)
Description   Today take 1.5 tablets then start taking 1 tablet everyday except 1.5 tablets on Tuesdays. Recheck INR in 3 weeks. Coumadin Clinic [phone]

## 2018-01-27 DIAGNOSIS — I4891 Unspecified atrial fibrillation: Secondary | ICD-10-CM | POA: Diagnosis not present

## 2018-01-27 DIAGNOSIS — Z1389 Encounter for screening for other disorder: Secondary | ICD-10-CM | POA: Diagnosis not present

## 2018-01-27 DIAGNOSIS — I10 Essential (primary) hypertension: Secondary | ICD-10-CM | POA: Diagnosis not present

## 2018-01-27 DIAGNOSIS — E782 Mixed hyperlipidemia: Secondary | ICD-10-CM | POA: Diagnosis not present

## 2018-02-10 ENCOUNTER — Ambulatory Visit (INDEPENDENT_AMBULATORY_CARE_PROVIDER_SITE_OTHER): Payer: Medicare Other | Admitting: *Deleted

## 2018-02-10 DIAGNOSIS — I4891 Unspecified atrial fibrillation: Secondary | ICD-10-CM | POA: Diagnosis not present

## 2018-02-10 DIAGNOSIS — Z952 Presence of prosthetic heart valve: Secondary | ICD-10-CM

## 2018-02-10 DIAGNOSIS — Z5181 Encounter for therapeutic drug level monitoring: Secondary | ICD-10-CM | POA: Diagnosis not present

## 2018-02-10 DIAGNOSIS — Z953 Presence of xenogenic heart valve: Secondary | ICD-10-CM

## 2018-02-10 LAB — POCT INR: INR: 2.4

## 2018-02-10 NOTE — Patient Instructions (Signed)
Description   Continue taking 1 tablet everyday except 1.5 tablets on Thursdays. Recheck INR in 4 weeks. Coumadin Clinic [phone]

## 2018-02-21 DIAGNOSIS — N433 Hydrocele, unspecified: Secondary | ICD-10-CM | POA: Diagnosis not present

## 2018-02-21 DIAGNOSIS — R972 Elevated prostate specific antigen [PSA]: Secondary | ICD-10-CM | POA: Diagnosis not present

## 2018-02-21 DIAGNOSIS — N400 Enlarged prostate without lower urinary tract symptoms: Secondary | ICD-10-CM | POA: Diagnosis not present

## 2018-03-01 ENCOUNTER — Other Ambulatory Visit: Payer: Self-pay | Admitting: Surgery

## 2018-03-01 DIAGNOSIS — K429 Umbilical hernia without obstruction or gangrene: Secondary | ICD-10-CM | POA: Diagnosis not present

## 2018-03-01 DIAGNOSIS — D171 Benign lipomatous neoplasm of skin and subcutaneous tissue of trunk: Secondary | ICD-10-CM | POA: Diagnosis not present

## 2018-03-02 ENCOUNTER — Telehealth: Payer: Self-pay

## 2018-03-02 DIAGNOSIS — H353223 Exudative age-related macular degeneration, left eye, with inactive scar: Secondary | ICD-10-CM | POA: Diagnosis not present

## 2018-03-02 DIAGNOSIS — H43811 Vitreous degeneration, right eye: Secondary | ICD-10-CM | POA: Diagnosis not present

## 2018-03-02 DIAGNOSIS — H35351 Cystoid macular degeneration, right eye: Secondary | ICD-10-CM | POA: Diagnosis not present

## 2018-03-02 DIAGNOSIS — H353211 Exudative age-related macular degeneration, right eye, with active choroidal neovascularization: Secondary | ICD-10-CM | POA: Diagnosis not present

## 2018-03-02 NOTE — Telephone Encounter (Signed)
   [facility] Pre-operative Risk Assessment    Request for surgical clearance:  1. What type of surgery is being performed? Flank mass removal and Umbilical hernia repair  2. When is this surgery scheduled? TBD   3. What type of clearance is required (medical clearance vs. Pharmacy clearance to hold med vs. Both)? Both  4. Are there any medications that need to be held prior to surgery and how long? Pt is currently on Warfarin and will most likely require Lovenox bridge management , will need instructions on how the pt should hold medication preoperatively.     5. Practice name and name of physician performing surgery? [facility], Dr. Coralie Keens  6. What is your office phone number [phone]   7.   What is your office fax number [phone] Attn: Malachi Bonds  8.   Anesthesia type (None, local, MAC, general) ? General    Jason [name] 03/02/2018, 2:12 PM  _________________________________________________________________   (provider comments below)

## 2018-03-03 NOTE — Telephone Encounter (Signed)
Pending pharmacist recommendation before calling the patient. H/o PAF, bioprosthetic aortic and bioprosthetic mitral valve replacement in 08/2013.

## 2018-03-04 NOTE — Telephone Encounter (Signed)
Patient with diagnosis of afib with two bioprosthetic valves on warfarin for anticoagulation.    Procedure: flank mass removal and umbilical  Date of procedure: TBD  CHADS2-VASc score of  4 (CHF, HTN, AGE, DM2, stroke/tia x 2, CAD, AGE, male)  Per office protocol, patient can hold warfarin for 5 days prior to procedure.

## 2018-03-08 NOTE — Telephone Encounter (Signed)
Needs phone call. LVM on home phone to call preop clinic regarding cardiac clearance prior to anticipated surgery.

## 2018-03-09 MED ORDER — CLINDAMYCIN HCL 300 MG PO CAPS: ORAL_CAPSULE | ORAL | 1 refills | Status: DC

## 2018-03-09 NOTE — Telephone Encounter (Addendum)
   Primary Cardiologist: Larae Grooms, MD  Chart reviewed as part of pre-operative protocol coverage  [name] was last seen on 07/2017 by Dr. Irish Lack. Patient has h/o persistent atrial fib, blindness L eye, GERD, HTN, HLD, MD, mitral regurgitation and aortic stenosis s/p pericardial AV replacement and redo MV replacement with pericardial valve with normal coronaries in 2014.  I reviewed his case with pharmD who would recommend SBE prophylaxis based on the addition of the flank mass removal given the skin involvement. He is penicillin-allergic therefore per their protocol would antibiotic 30-60 minutes prior to procedure start. Pharmacy has also left recommendations below regarding anticoagulation interruption - he does not have a history of TIA/CVA and does not have a mechanical valve so they do not feel he requires Lovenox bridging per protocol.  Ellen Henri PA-C tried to call patient yesterday and LMOM. I called patient's home number and wife's number, both went right to voicemail. I LMOM on his main number to call us back during pre-op hours. When he calls back, will need to have discussion about recent cardiac status/symptoms for clearance, and also given instructions on SBE prophylaxis and anticoagulation. Will route this message to requesting provider to keep them informed we are waiting to touch base w/ patient.  Charlie Pitter, PA-C 03/09/2018, 1:28 PM   Addendum 1:58 PM: I was able to speak with patient. He is still very active and does yardwork frequently. No new cardiac concerns. Able to perform over 7 METS without angina or dyspnea. Based on ACC/AHA guidelines, Jason [name] would be at acceptable risk for the planned procedure without further cardiovascular testing. Given his penicillin allergy, will need alternative antibiotic for SBE prophylaxis. Azithromycin was considered but given potential to interfere with warfarin when resumed, will use clindamycin instead - have  sent in rx for 300mg  take 2 capsules 30-60 minutes prior to surgery. He has a Coumadin clinic appointment tomorrow in our office and I've asked him to make them aware that he will be pending upcoming scheduling of surgery so they can help coordinate f/u INR checks. Will route this bundled recommendation to requesting provider via Epic fax function. Please call with questions.   Charlie Pitter, PA-C 03/09/2018, 1:59 PM

## 2018-03-10 ENCOUNTER — Ambulatory Visit (INDEPENDENT_AMBULATORY_CARE_PROVIDER_SITE_OTHER): Payer: Medicare Other | Admitting: *Deleted

## 2018-03-10 DIAGNOSIS — Z953 Presence of xenogenic heart valve: Secondary | ICD-10-CM

## 2018-03-10 DIAGNOSIS — I4891 Unspecified atrial fibrillation: Secondary | ICD-10-CM | POA: Diagnosis not present

## 2018-03-10 DIAGNOSIS — I4819 Other persistent atrial fibrillation: Secondary | ICD-10-CM

## 2018-03-10 DIAGNOSIS — Z5181 Encounter for therapeutic drug level monitoring: Secondary | ICD-10-CM

## 2018-03-10 DIAGNOSIS — Z952 Presence of prosthetic heart valve: Secondary | ICD-10-CM

## 2018-03-10 DIAGNOSIS — I481 Persistent atrial fibrillation: Secondary | ICD-10-CM

## 2018-03-10 LAB — POCT INR: INR: 1.9 — AB (ref 2.0–3.0)

## 2018-03-10 NOTE — Patient Instructions (Signed)
Description   Today May 30th take 2 tablets then continue taking 1 tablet everyday except 1.5 tablets on Thursdays. Recheck INR in 4 weeks. Coumadin Clinic [phone] Pt is to have removal of flank mass and repair of umbilical hernia but nothing is scheduled and pt is instructed to call as soon as the surgery is scheduled

## 2018-03-17 ENCOUNTER — Telehealth: Payer: Self-pay | Admitting: *Deleted

## 2018-03-17 NOTE — Telephone Encounter (Signed)
Spoke with pt and he states his surgery is scheduled for Jun19th Pt instructed last day to take coumadin in June 13th then no coumadin until after surgery then ask surgeon if he should restart coumadin that night of surgery or wait until next day Then instructed when restarts coumadin to take extra 1/2 tablet of coumadin for 2 days then continue same dose of coumadin  5mg  daily except 7.5mg  on Thursdays Pt states understanding and verbalizes instructions back to nurse

## 2018-03-22 NOTE — Pre-Procedure Instructions (Signed)
Jason [name]  03/22/2018      [facility] [address], [address] AT [facility] OF [place] [address] Phone: [phone] Fax: [phone]    Your procedure is scheduled on 03/30/2018.  Report to [facility] Admitting at [place].M.  Call this number if you have problems the morning of surgery:  [phone]   Remember:  Nothing to eat or drink after midnight the night before your surgery.   Continue all medications as directed by your physician except follow these medication instructions before surgery below    Take these medicines the morning of surgery with A SIP OF WATER: Acetaminophen (Tylenol) Alphagan eye drops Clindamycin (Cleocin) Metoprolol succinate (Toprol-XL)  7 days prior to surgery STOP taking any Aspirin (unless otherwise instructed by your surgeon), Aleve, Naproxen, Ibuprofen, Motrin, Advil, Goody's, BC's, all herbal medications, fish oil, and all vitamins.  Follow your doctors instructions regarding your warfarin (Coumadin).  Last dose of Warfarin (Coumadin) should be on 03/24/2018.      Do not wear jewelry, make-up or nail polish.  Do not wear lotions, powders, or perfumes, or deodorant.  Men may shave face and neck.  Do not bring valuables to the hospital.  [facility] is not responsible for any belongings or valuables.  Hearing aids, eyeglasses, contacts, dentures or bridgework may not be worn into surgery.  Leave your suitcase in the car.  After surgery it may be brought to your room.  For patients admitted to the hospital, discharge time will be determined by your treatment team.  Patients discharged the day of surgery will not be allowed to drive home.   Name and phone number of your driver:    Special instructions:   [place]- Preparing For Surgery  Before surgery, you can play an important role. Because skin is not sterile, your skin needs to be as free of germs as  possible. You can reduce the number of germs on your skin by washing with CHG (chlorahexidine gluconate) Soap before surgery.  CHG is an antiseptic cleaner which kills germs and bonds with the skin to continue killing germs even after washing.    Oral Hygiene is also important to reduce your risk of infection.  Remember - BRUSH YOUR TEETH THE MORNING OF SURGERY WITH YOUR REGULAR TOOTHPASTE  Please do not use if you have an allergy to CHG or antibacterial soaps. If your skin becomes reddened/irritated stop using the CHG.  Do not shave (including legs and underarms) for at least 48 hours prior to first CHG shower. It is OK to shave your face.  Please follow these instructions carefully.   1. Shower the NIGHT BEFORE SURGERY and the MORNING OF SURGERY with CHG.   2. If you chose to wash your hair, wash your hair first as usual with your normal shampoo.  3. After you shampoo, rinse your hair and body thoroughly to remove the shampoo.  4. Use CHG as you would any other liquid soap. You can apply CHG directly to the skin and wash gently with a scrungie or a clean washcloth.   5. Apply the CHG Soap to your body ONLY FROM THE NECK DOWN.  Do not use on open wounds or open sores. Avoid contact with your eyes, ears, mouth and genitals (private parts). Wash Face and genitals (private parts)  with your normal soap.  6. Wash thoroughly, paying special attention to the  area where your surgery will be performed.  7. Thoroughly rinse your body with warm water from the neck down.  8. DO NOT shower/wash with your normal soap after using and rinsing off the CHG Soap.  9. Pat yourself dry with a CLEAN TOWEL.  10. Wear CLEAN PAJAMAS to bed the night before surgery, wear comfortable clothes the morning of surgery  11. Place CLEAN SHEETS on your bed the night of your first shower and DO NOT SLEEP WITH PETS.    Day of Surgery: Shower as stated above. Do not apply any deodorants/lotions.  Please wear clean  clothes to the hospital/surgery center.   Remember to brush your teeth WITH YOUR REGULAR TOOTHPASTE.    Please read over the following fact sheets that you were given.

## 2018-03-22 NOTE — Pre-Procedure Instructions (Signed)
Lennyn Bellanca Baca  03/22/2018      [facility] [address], [address] AT [facility] OF [place] [address] Phone: [phone] Fax: [phone]    Your procedure is scheduled on 03/30/2018.  Report to [facility] Admitting at [place].M.  Call this number if you have problems the morning of surgery:  [phone]   Remember:  Nothing to eat or drink after midnight the night before your surgery.  Please complete your PRE-SURGERY ENSURE that was given to you before 5:30am the morning of surgery.  Please, if able, drink it in one setting. DO NOT SIP.    Continue all medications as directed by your physician except follow these medication instructions before surgery below    Take these medicines the morning of surgery with A SIP OF WATER: Acetaminophen (Tylenol) Alphagan eye drops Clindamycin (Cleocin) Metoprolol succinate (Toprol-XL)  7 days prior to surgery STOP taking any Aspirin (unless otherwise instructed by your surgeon), Aleve, Naproxen, Ibuprofen, Motrin, Advil, Goody's, BC's, all herbal medications, fish oil, and all vitamins.  Follow your doctors instructions regarding your warfarin (Coumadin).  Last dose of Warfarin (Coumadin) should be on 03/24/2018.      Do not wear jewelry, make-up or nail polish.  Do not wear lotions, powders, or perfumes, or deodorant.  Men may shave face and neck.  Do not bring valuables to the hospital.  [facility] is not responsible for any belongings or valuables.  Hearing aids, eyeglasses, contacts, dentures or bridgework may not be worn into surgery.  Leave your suitcase in the car.  After surgery it may be brought to your room.  For patients admitted to the hospital, discharge time will be determined by your treatment team.  Patients discharged the day of surgery will not be allowed to drive home.   Name and phone number of your driver:    Special instructions:    [place]- Preparing For Surgery  Before surgery, you can play an important role. Because skin is not sterile, your skin needs to be as free of germs as possible. You can reduce the number of germs on your skin by washing with CHG (chlorahexidine gluconate) Soap before surgery.  CHG is an antiseptic cleaner which kills germs and bonds with the skin to continue killing germs even after washing.    Oral Hygiene is also important to reduce your risk of infection.  Remember - BRUSH YOUR TEETH THE MORNING OF SURGERY WITH YOUR REGULAR TOOTHPASTE  Please do not use if you have an allergy to CHG or antibacterial soaps. If your skin becomes reddened/irritated stop using the CHG.  Do not shave (including legs and underarms) for at least 48 hours prior to first CHG shower. It is OK to shave your face.  Please follow these instructions carefully.   1. Shower the NIGHT BEFORE SURGERY and the MORNING OF SURGERY with CHG.   2. If you chose to wash your hair, wash your hair first as usual with your normal shampoo.  3. After you shampoo, rinse your hair and body thoroughly to remove the shampoo.  4. Use CHG as you would any other liquid soap. You can apply CHG directly to the skin and wash gently with a scrungie or a clean washcloth.   5. Apply the CHG Soap to your body ONLY FROM THE NECK DOWN.  Do not use on open wounds or open sores. Avoid  contact with your eyes, ears, mouth and genitals (private parts). Wash Face and genitals (private parts)  with your normal soap.  6. Wash thoroughly, paying special attention to the area where your surgery will be performed.  7. Thoroughly rinse your body with warm water from the neck down.  8. DO NOT shower/wash with your normal soap after using and rinsing off the CHG Soap.  9. Pat yourself dry with a CLEAN TOWEL.  10. Wear CLEAN PAJAMAS to bed the night before surgery, wear comfortable clothes the morning of surgery  11. Place CLEAN SHEETS on your bed the  night of your first shower and DO NOT SLEEP WITH PETS.    Day of Surgery: Shower as stated above. Do not apply any deodorants/lotions.  Please wear clean clothes to the hospital/surgery center.   Remember to brush your teeth WITH YOUR REGULAR TOOTHPASTE.    Please read over the following fact sheets that you were given.

## 2018-03-23 ENCOUNTER — Encounter (HOSPITAL_COMMUNITY): Payer: Self-pay

## 2018-03-23 ENCOUNTER — Other Ambulatory Visit: Payer: Self-pay

## 2018-03-23 ENCOUNTER — Encounter (HOSPITAL_COMMUNITY)
Admission: RE | Admit: 2018-03-23 | Discharge: 2018-03-23 | Disposition: A | Discharge status: Home / Self Care | Payer: Medicare Other | Source: Ambulatory Visit | Attending: Surgery | Admitting: Surgery

## 2018-03-23 DIAGNOSIS — I2510 Atherosclerotic heart disease of native coronary artery without angina pectoris: Secondary | ICD-10-CM | POA: Diagnosis not present

## 2018-03-23 DIAGNOSIS — Z7901 Long term (current) use of anticoagulants: Secondary | ICD-10-CM | POA: Diagnosis not present

## 2018-03-23 DIAGNOSIS — Z87442 Personal history of urinary calculi: Secondary | ICD-10-CM | POA: Insufficient documentation

## 2018-03-23 DIAGNOSIS — I10 Essential (primary) hypertension: Secondary | ICD-10-CM | POA: Diagnosis not present

## 2018-03-23 DIAGNOSIS — R1900 Intra-abdominal and pelvic swelling, mass and lump, unspecified site: Secondary | ICD-10-CM | POA: Insufficient documentation

## 2018-03-23 DIAGNOSIS — Z953 Presence of xenogenic heart valve: Secondary | ICD-10-CM | POA: Insufficient documentation

## 2018-03-23 DIAGNOSIS — H3530 Unspecified macular degeneration: Secondary | ICD-10-CM | POA: Diagnosis not present

## 2018-03-23 DIAGNOSIS — K219 Gastro-esophageal reflux disease without esophagitis: Secondary | ICD-10-CM | POA: Diagnosis not present

## 2018-03-23 DIAGNOSIS — E785 Hyperlipidemia, unspecified: Secondary | ICD-10-CM | POA: Diagnosis not present

## 2018-03-23 DIAGNOSIS — I481 Persistent atrial fibrillation: Secondary | ICD-10-CM | POA: Insufficient documentation

## 2018-03-23 DIAGNOSIS — K429 Umbilical hernia without obstruction or gangrene: Secondary | ICD-10-CM | POA: Diagnosis not present

## 2018-03-23 DIAGNOSIS — Z79899 Other long term (current) drug therapy: Secondary | ICD-10-CM | POA: Insufficient documentation

## 2018-03-23 DIAGNOSIS — Z01812 Encounter for preprocedural laboratory examination: Secondary | ICD-10-CM | POA: Insufficient documentation

## 2018-03-23 DIAGNOSIS — H547 Unspecified visual loss: Secondary | ICD-10-CM | POA: Diagnosis not present

## 2018-03-23 HISTORY — DX: Personal history of urinary calculi: Z87.442

## 2018-03-23 HISTORY — DX: Atherosclerotic heart disease of native coronary artery without angina pectoris: I25.10

## 2018-03-23 LAB — CBC
HCT: 45 % (ref 39.0–52.0)
Hemoglobin: 14.5 g/dL (ref 13.0–17.0)
MCH: 29.1 pg (ref 26.0–34.0)
MCHC: 32.2 g/dL (ref 30.0–36.0)
MCV: 90.4 fL (ref 78.0–100.0)
Platelets: 185 10*3/uL (ref 150–400)
RBC: 4.98 MIL/uL (ref 4.22–5.81)
RDW: 13 % (ref 11.5–15.5)
WBC: 6.9 10*3/uL (ref 4.0–10.5)

## 2018-03-23 LAB — BASIC METABOLIC PANEL
Anion gap: 9 (ref 5–15)
BUN: 18 mg/dL (ref 6–20)
CO2: 28 mmol/L (ref 22–32)
Calcium: 9.9 mg/dL (ref 8.9–10.3)
Chloride: 105 mmol/L (ref 101–111)
Creatinine, Ser: 1.32 mg/dL — ABNORMAL HIGH (ref 0.61–1.24)
GFR calc Af Amer: 56 mL/min — ABNORMAL LOW (ref >60)
GFR calc non Af Amer: 48 mL/min — ABNORMAL LOW (ref >60)
Glucose, Bld: 99 mg/dL (ref 65–99)
Potassium: 4.1 mmol/L (ref 3.5–5.1)
Sodium: 142 mmol/L (ref 135–145)

## 2018-03-23 NOTE — Progress Notes (Signed)
PCP - Dr. Shirline Frees Cardiologist - Dr. Casandra Doffing  Chest x-ray - Not indicated  EKG - 08/29/2017 Stress Test - patient denies ECHO - 09/21/2013 Cardiac Cath - 2014  Sleep Study - patient denies  Blood Thinner Instructions: Coumadin; instructed to take last dose on 03/24/2018.  Aspirin Instructions: N/A  Anesthesia review: Yes, Heart history and CAD  Patient denies shortness of breath, fever, cough and chest pain at PAT appointment   Patient verbalized understanding of instructions that were given to them at the PAT appointment. Patient was also instructed that they will need to review over the PAT instructions again at home before surgery.

## 2018-03-24 ENCOUNTER — Encounter (HOSPITAL_COMMUNITY): Payer: Self-pay

## 2018-03-24 NOTE — Progress Notes (Signed)
Anesthesia Chart Review:   Case:  [id number] Date/Time:  03/30/18 0815   Procedures:      HERNIA REPAIR UMBILICAL (N/A )     INSERTION OF MESH (N/A )     EXCISION RIGHT FLANK  MASS (Right Flank)   Anesthesia type:  Choice   Pre-op diagnosis:  umbilical hernia right flank mass   Location:  [facility] OR ROOM 01 / [place] OR   Surgeon:  Coralie Keens, MD      DISCUSSION: - Pt is an 84 year old male with hx AV replacement 2014, MV replacement 2014, persistent afib, HTN  - Note pt is blind  - Pt to take clindamycin prior to surgery for SBE prophylaxis  - To hold coumadin 5 days before surgery   - Pt given cardiac clearance for surgery by Melina Copa, PA 03/09/18   VS: BP 138/82   Pulse 75   Temp 36.6 C   Resp 18   Ht 5\' 8"  (1.727 m)   Wt 175 lb 12.8 oz (79.7 kg)   SpO2 97%   BMI 26.73 kg/m    PROVIDERS: PCP is Shirline Frees, MD   Patient Care Team: Jettie Booze, MD as Consulting Physician (Cardiology) Rexene Alberts, MD as Consulting Physician (Cardiothoracic Surgery) Franchot Gallo, MD as Consulting Physician (Urology) Druscilla Brownie, MD as Consulting Physician (Dermatology) Zadie Rhine Clent Demark, MD as Consulting Physician (Ophthalmology)   LABS: Labs reviewed: Acceptable for surgery.  - PT/INR will be obtained day of surgery   (all labs ordered are listed, but only abnormal results are displayed)  Labs Reviewed  BASIC METABOLIC PANEL - Abnormal; Notable for the following components:      Result Value   Creatinine, Ser 1.32 (*)    GFR calc non Af Amer 48 (*)    GFR calc Af Amer 56 (*)    All other components within normal limits  CBC    EKG 08/19/17: Atrial fibrillation (78 bpm).  RBBB.  LAFB.   CV:  Echo 09/21/13:  - Left ventricle: Subvalvular mitral apparatus mobile in theleft ventricle. The cavity size was normal. The estimatedejection fraction was 50%. Although no diagnostic regionalwall motion abnormality was identified, this possibilitycannot  be completely excluded on the basis of this study. - Aortic valve: A bioprosthesis was present. - Mitral valve: A bioprosthesis was present. - Left atrium: The atrium was moderately dilated. - Right ventricle: The cavity size was moderately dilated.Systolic function was mildly reduced. - Right atrium: The atrium was mildly dilated.  Cardiac cath 07/05/13:  1. No significant coronary artery disease (20% proximal RCA). 2. Normal left ventricular systolic function.  LVEDP 15 mmHg.  Ejection fraction 60%. 3.   Moderate pulmonary hypertension.  Cardiac index 2.53.  Prominent V waves noted on the wedge tracing. 4.   Severe aortic stenosis with calculated aortic valve area of 0.77 cm.   Past Medical History:  Diagnosis Date  . AK (actinic keratosis)   . Atrial fibrillation, persistent (HCC)    chronic coumadin therapy  . Blindness    left eye  . Coronary artery disease    20% RCA by 2014 cath  . Elevated PSA   . GERD (gastroesophageal reflux disease)   . H/O hiatal hernia   . Heart murmur   . History of kidney stones   . Hydrocele    left  . Hyperlipidemia   . Hypertension   . Internal hemorrhoids   . Kidney stones    kidney stones  .  Macular degeneration    right eye  . Mitral regurgitation 02/25/2011   Recurrent MR s/p mitral valve repair   . OA (osteoarthritis) of neck   . S/P aortic valve replacement with bioprosthetic valve 08/15/2013   26mm Edwards Magna Ease bovine pericardial tissue valve  . S/P mitral valve repair 09/15/2004   Complex valvuloplasty including artificial Goretex neocord placement x4 and 40mm SARP ring annuloplasty via right mini thoracotomy approach - Dr Evelina Dun @ [facility]  . S/P redo mitral valve replacement and aortic valve replacement with bioprosthetic valves 08/15/2013   35mm Edwards Magna Mitral bovine pericardial tissue valve  . Severe aortic stenosis 11/14   tissue AVR   . Vocal cord paralysis     Past Surgical History:  Procedure Laterality Date   . AORTIC VALVE REPLACEMENT N/A 08/15/2013   Procedure: AORTIC VALVE REPLACEMENT (AVR);  Surgeon: Rexene Alberts, MD;  Location: [place] [place];  Service: Open Heart Surgery;  Laterality: N/A;  . ESOPHAGOGASTRODUODENOSCOPY N/A 08/02/2013   Procedure: ESOPHAGOGASTRODUODENOSCOPY (EGD);  Surgeon: Casandra Doffing, MD;  Location: [facility] ENDOSCOPY;  Service: Cardiovascular;  Laterality: N/A;  . EYE SURGERY Right    infection  . GROIN MASS OPEN BIOPSY  2012  . HEMORRHOID SURGERY     pt denies.  . INGUINAL HERNIA REPAIR  08/2011  . INTRAOPERATIVE TRANSESOPHAGEAL ECHOCARDIOGRAM N/A 08/15/2013   Procedure: INTRAOPERATIVE TRANSESOPHAGEAL ECHOCARDIOGRAM;  Surgeon: Rexene Alberts, MD;  Location: [place];  Service: Open Heart Surgery;  Laterality: N/A;  . LEFT AND RIGHT HEART CATHETERIZATION WITH CORONARY ANGIOGRAM N/A 07/05/2013   Procedure: LEFT AND RIGHT HEART CATHETERIZATION WITH CORONARY ANGIOGRAM;  Surgeon: Jettie Booze, MD;  Location: [facility] CATH LAB;  Service: Cardiovascular;  Laterality: N/A;  . MITRAL VALVE REPAIR  09/16/2011   @ [facility], Dr Evelina Dun  . MITRAL VALVE REPAIR N/A 08/15/2013   Procedure: REDO MITRAL VALVE (MV) REPLACEMENT;  Surgeon: Rexene Alberts, MD;  Location: [place];  Service: Open Heart Surgery;  Laterality: N/A;  . TEE WITHOUT CARDIOVERSION N/A 07/05/2013   Procedure: TRANSESOPHAGEAL ECHOCARDIOGRAM (TEE);  Surgeon: Casandra Doffing, MD;  Location: [facility] ENDOSCOPY;  Service: Cardiovascular;  Laterality: N/A;  . TEE WITHOUT CARDIOVERSION N/A 08/02/2013   Procedure: TRANSESOPHAGEAL ECHOCARDIOGRAM (TEE);  Surgeon: Casandra Doffing, MD;  Location: [facility] ENDOSCOPY;  Service: Cardiovascular;  Laterality: N/A;    MEDICATIONS: . acetaminophen (TYLENOL) 650 MG CR tablet  . ALPHAGAN P 0.1 % SOLN  . Ascorbic Acid (VITAMIN C) 1000 MG tablet  . atorvastatin (LIPITOR) 20 MG tablet  . b complex vitamins tablet  . CARTIA XT 240 MG 24 hr capsule  . clindamycin (CLEOCIN) 300 MG capsule  . furosemide (LASIX) 40 MG tablet  .  lisinopril (PRINIVIL,ZESTRIL) 20 MG tablet  . metoprolol succinate (TOPROL-XL) 25 MG 24 hr tablet  . Multiple Vitamins-Minerals (PRESERVISION AREDS PO)  . warfarin (COUMADIN) 5 MG tablet   No current facility-administered medications for this encounter.    - To hold coumadin 5 days before surgery  - Pt to take clindamycin prior to surgery for SBE prophylaxis   If labs acceptable day of surgery, I anticipate pt can proceed with surgery as scheduled.  Willeen Cass, FNP-BC [facility] Short Stay Surgical Center/Anesthesiology Phone: [phone] 03/24/2018 12:32 PM

## 2018-03-29 NOTE — H&P (Signed)
  Ann Maki Documented: 03/01/2018 10:55 AM Location: [facility] Patient #: [id number] DOB: 10/01/1932 Married / Language: English / Race: White Male   History of Present Illness (Myrtle Haller A. Ninfa Linden MD; 03/01/2018 11:13 AM) The patient is a 84 year old male who presents with a complaint of Mass. He is here today for long-term follow-up of his right flank mass which is a suspected lipoma. We were holding on surgery as he had a hydrocele which would potentially require surgery by the urologist and he is on Coumadin. He reports of the urologist decided not to operate on the hydrocele. Since I saw him in November, he has now developed a symptomatic umbilical hernia. He reports he is lifting when the small hernia occurred causing some mild discomfort but no obstructive symptoms. He still has occasional discomfort at the umbilicus reports that the hernia can be reduced with some difficulty. He remains on Coumadin   Allergies (Tanisha A. Owens Shark, [place]; 03/01/2018 10:56 AM) Penicillins  Allergies Reconciled   Medication History (Tanisha A. Owens Shark, [place]; 03/01/2018 10:56 AM) Alphagan P (0.1% Solution, Ophthalmic) Active. Atorvastatin Calcium (20MG  Tablet, Oral) Active. Cartia XT (240MG  Capsule ER 24HR, Oral) Active. Furosemide (40MG  Tablet, Oral) Active. Warfarin Sodium (5MG  Tablet, Oral) Active. Lisinopril (20MG  Tablet, Oral) Active. Vitamin B-12 (1000MCG Tablet, Oral) Active. Vitamin B Complex (Oral) Active. Medications Reconciled  Vitals (Tanisha A. Brown RMA; 03/01/2018 10:55 AM) 03/01/2018 10:55 AM Weight: 174 lb Height: 68in Body Surface Area: 1.93 m Body Mass Index: 26.46 kg/m  Temp.: 97.23F  Pulse: 80 (Regular)  BP: 136/88 (Sitting, Left Arm, Standard)       Physical Exam (Sarin Comunale A. Ninfa Linden MD; 03/01/2018 11:13 AM) The physical exam findings are as follows: Note:On exam, the right flank mass is unchanged. It feels consistent with a lipoma.  He also has a small umbilical hernia with what feels like his incarcerated omentum which I was able to reduce Lungs clear CV IRR Skin without rash or erythema    Assessment & Plan (Calandra Madura A. Ninfa Linden MD; 03/01/2018 11:14 AM) LIPOMA OF FLANK (M62.9) UMBILICAL HERNIA Impression: We discussed hernia detail. I recommend repair of the hernia with mesh as he is symptomatic and needs chronic anticoagulation. We would rather operate now than an emergent situation. I would also excise the right flank mass which is approximate centimeters so it can be evaluated histologically. We discussed the surgical procedure in detail including the risks. We will get preoperative cardiac clearance to see whether he can to stop his Coumadin or whether he needs a Lovenox bridge     Signed by Harl Bowie, MD (03/01/2018 1

## 2018-03-30 ENCOUNTER — Ambulatory Visit (HOSPITAL_COMMUNITY): Payer: Medicare Other | Admitting: Emergency Medicine

## 2018-03-30 ENCOUNTER — Ambulatory Visit (HOSPITAL_COMMUNITY): Payer: Medicare Other | Admitting: Certified Registered Nurse Anesthetist

## 2018-03-30 ENCOUNTER — Ambulatory Visit (HOSPITAL_COMMUNITY)
Admission: RE | Admit: 2018-03-30 | Discharge: 2018-03-30 | Disposition: A | Discharge status: Home / Self Care | Payer: Medicare Other | Source: Ambulatory Visit | Attending: Surgery | Admitting: Surgery

## 2018-03-30 ENCOUNTER — Encounter (HOSPITAL_COMMUNITY): Payer: Self-pay

## 2018-03-30 ENCOUNTER — Encounter (HOSPITAL_COMMUNITY)
Admission: RE | Disposition: A | Discharge status: Home / Self Care | Payer: Self-pay | Source: Ambulatory Visit | Attending: Surgery

## 2018-03-30 DIAGNOSIS — Z953 Presence of xenogenic heart valve: Secondary | ICD-10-CM | POA: Diagnosis not present

## 2018-03-30 DIAGNOSIS — Z87891 Personal history of nicotine dependence: Secondary | ICD-10-CM | POA: Insufficient documentation

## 2018-03-30 DIAGNOSIS — I10 Essential (primary) hypertension: Secondary | ICD-10-CM | POA: Insufficient documentation

## 2018-03-30 DIAGNOSIS — R222 Localized swelling, mass and lump, trunk: Secondary | ICD-10-CM | POA: Diagnosis not present

## 2018-03-30 DIAGNOSIS — Z7901 Long term (current) use of anticoagulants: Secondary | ICD-10-CM | POA: Diagnosis not present

## 2018-03-30 DIAGNOSIS — Z79899 Other long term (current) drug therapy: Secondary | ICD-10-CM | POA: Diagnosis not present

## 2018-03-30 DIAGNOSIS — K429 Umbilical hernia without obstruction or gangrene: Secondary | ICD-10-CM | POA: Diagnosis not present

## 2018-03-30 DIAGNOSIS — I4891 Unspecified atrial fibrillation: Secondary | ICD-10-CM | POA: Diagnosis not present

## 2018-03-30 DIAGNOSIS — D171 Benign lipomatous neoplasm of skin and subcutaneous tissue of trunk: Secondary | ICD-10-CM | POA: Diagnosis not present

## 2018-03-30 DIAGNOSIS — Z952 Presence of prosthetic heart valve: Secondary | ICD-10-CM | POA: Insufficient documentation

## 2018-03-30 DIAGNOSIS — Z880 Allergy status to penicillin: Secondary | ICD-10-CM | POA: Diagnosis not present

## 2018-03-30 HISTORY — PX: UMBILICAL HERNIA REPAIR: SHX196

## 2018-03-30 HISTORY — PX: MASS EXCISION: SHX2000

## 2018-03-30 LAB — PROTIME-INR
INR: 1.09
Prothrombin Time: 14 seconds (ref 11.4–15.2)

## 2018-03-30 SURGERY — REPAIR, HERNIA, UMBILICAL, ADULT
Anesthesia: General | Site: Flank | Laterality: Right

## 2018-03-30 MED ORDER — MEPERIDINE HCL 50 MG/ML IJ SOLN: 6.2500 mg | INTRAMUSCULAR | Status: DC | PRN

## 2018-03-30 MED ORDER — CHLORHEXIDINE GLUCONATE CLOTH 2 % EX PADS: 6.0000 | MEDICATED_PAD | Freq: Once | CUTANEOUS | Status: DC

## 2018-03-30 MED ORDER — FENTANYL CITRATE (PF) 250 MCG/5ML IJ SOLN
INTRAMUSCULAR | Status: DC | PRN
  Administered 2018-03-30: 100 ug via INTRAVENOUS

## 2018-03-30 MED ORDER — 0.9 % SODIUM CHLORIDE (POUR BTL) OPTIME
TOPICAL | Status: DC | PRN
  Administered 2018-03-30: 1000 mL

## 2018-03-30 MED ORDER — SUGAMMADEX SODIUM 200 MG/2ML IV SOLN
INTRAVENOUS | Status: AC
  Filled 2018-03-30: qty 2

## 2018-03-30 MED ORDER — EPHEDRINE SULFATE 50 MG/ML IJ SOLN
INTRAMUSCULAR | Status: AC
  Filled 2018-03-30: qty 3

## 2018-03-30 MED ORDER — FENTANYL CITRATE (PF) 250 MCG/5ML IJ SOLN
INTRAMUSCULAR | Status: AC
  Filled 2018-03-30: qty 5

## 2018-03-30 MED ORDER — ACETAMINOPHEN 500 MG PO TABS
1000.0000 mg | ORAL_TABLET | ORAL | Status: AC
  Administered 2018-03-30: 1000 mg via ORAL
  Filled 2018-03-30: qty 2

## 2018-03-30 MED ORDER — DEXAMETHASONE SODIUM PHOSPHATE 10 MG/ML IJ SOLN
INTRAMUSCULAR | Status: AC
  Filled 2018-03-30: qty 1

## 2018-03-30 MED ORDER — GABAPENTIN 300 MG PO CAPS
300.0000 mg | ORAL_CAPSULE | ORAL | Status: AC
  Administered 2018-03-30: 300 mg via ORAL
  Filled 2018-03-30: qty 1

## 2018-03-30 MED ORDER — PROPOFOL 10 MG/ML IV BOLUS
INTRAVENOUS | Status: DC | PRN
  Administered 2018-03-30: 100 mg via INTRAVENOUS

## 2018-03-30 MED ORDER — ONDANSETRON HCL 4 MG/2ML IJ SOLN
INTRAMUSCULAR | Status: DC | PRN
  Administered 2018-03-30: 4 mg via INTRAVENOUS

## 2018-03-30 MED ORDER — LIDOCAINE 2% (20 MG/ML) 5 ML SYRINGE
INTRAMUSCULAR | Status: AC
  Filled 2018-03-30: qty 5

## 2018-03-30 MED ORDER — LIDOCAINE 2% (20 MG/ML) 5 ML SYRINGE
INTRAMUSCULAR | Status: DC | PRN
  Administered 2018-03-30: 100 mg via INTRAVENOUS

## 2018-03-30 MED ORDER — TRAMADOL HCL 50 MG PO TABS: 50.0000 mg | ORAL_TABLET | Freq: Four times a day (QID) | ORAL | 1 refills | Status: DC | PRN

## 2018-03-30 MED ORDER — PROPOFOL 10 MG/ML IV BOLUS
INTRAVENOUS | Status: AC
  Filled 2018-03-30: qty 20

## 2018-03-30 MED ORDER — METOCLOPRAMIDE HCL 5 MG/ML IJ SOLN: 10.0000 mg | Freq: Once | INTRAMUSCULAR | Status: DC | PRN

## 2018-03-30 MED ORDER — LACTATED RINGERS IV SOLN
INTRAVENOUS | Status: DC | PRN
  Administered 2018-03-30: 08:00:00 via INTRAVENOUS

## 2018-03-30 MED ORDER — SODIUM CHLORIDE 0.9 % IJ SOLN
INTRAMUSCULAR | Status: AC
  Filled 2018-03-30: qty 20

## 2018-03-30 MED ORDER — BUPIVACAINE-EPINEPHRINE (PF) 0.25% -1:200000 IJ SOLN
INTRAMUSCULAR | Status: AC
  Filled 2018-03-30: qty 30

## 2018-03-30 MED ORDER — PHENYLEPHRINE 40 MCG/ML (10ML) SYRINGE FOR IV PUSH (FOR BLOOD PRESSURE SUPPORT)
PREFILLED_SYRINGE | INTRAVENOUS | Status: DC | PRN
  Administered 2018-03-30 (×2): 40 ug via INTRAVENOUS

## 2018-03-30 MED ORDER — CIPROFLOXACIN IN D5W 400 MG/200ML IV SOLN
400.0000 mg | INTRAVENOUS | Status: AC
Start: 2018-03-30 — End: 2018-03-30
  Administered 2018-03-30: 400 mg via INTRAVENOUS
  Filled 2018-03-30: qty 200

## 2018-03-30 MED ORDER — ONDANSETRON HCL 4 MG/2ML IJ SOLN
INTRAMUSCULAR | Status: AC
  Filled 2018-03-30: qty 2

## 2018-03-30 MED ORDER — BUPIVACAINE-EPINEPHRINE 0.25% -1:200000 IJ SOLN
INTRAMUSCULAR | Status: DC | PRN
  Administered 2018-03-30 (×2): 10 mL

## 2018-03-30 MED ORDER — ROCURONIUM BROMIDE 50 MG/5ML IV SOLN
INTRAVENOUS | Status: AC
  Filled 2018-03-30: qty 2

## 2018-03-30 MED ORDER — EPHEDRINE SULFATE 50 MG/ML IJ SOLN
INTRAMUSCULAR | Status: DC | PRN
  Administered 2018-03-30 (×3): 5 mg via INTRAVENOUS

## 2018-03-30 MED ORDER — FENTANYL CITRATE (PF) 100 MCG/2ML IJ SOLN: 25.0000 ug | INTRAMUSCULAR | Status: DC | PRN

## 2018-03-30 SURGICAL SUPPLY — 44 items
BLADE CLIPPER SURG (BLADE) ×5 IMPLANT
BLADE SURG 10 STRL SS (BLADE) ×5 IMPLANT
BLADE SURG 15 STRL LF DISP TIS (BLADE) ×3 IMPLANT
BLADE SURG 15 STRL SS (BLADE) ×2
CANISTER SUCT 3000ML PPV (MISCELLANEOUS) ×5 IMPLANT
CHLORAPREP W/TINT 26ML (MISCELLANEOUS) ×5 IMPLANT
COVER SURGICAL LIGHT HANDLE (MISCELLANEOUS) ×5 IMPLANT
DECANTER SPIKE VIAL GLASS SM (MISCELLANEOUS) ×5 IMPLANT
DERMABOND ADVANCED (GAUZE/BANDAGES/DRESSINGS) ×4
DERMABOND ADVANCED .7 DNX12 (GAUZE/BANDAGES/DRESSINGS) ×6 IMPLANT
DRAPE LAPAROSCOPIC ABDOMINAL (DRAPES) ×5 IMPLANT
DRAPE LAPAROTOMY 100X72 PEDS (DRAPES) ×5 IMPLANT
DRAPE UTILITY XL STRL (DRAPES) ×5 IMPLANT
DRSG TEGADERM 4X4.75 (GAUZE/BANDAGES/DRESSINGS) ×5 IMPLANT
ELECT CAUTERY BLADE 6.4 (BLADE) ×5 IMPLANT
ELECT REM PT RETURN 9FT ADLT (ELECTROSURGICAL) ×5
ELECTRODE REM PT RTRN 9FT ADLT (ELECTROSURGICAL) ×3 IMPLANT
GAUZE SPONGE 4X4 12PLY STRL (GAUZE/BANDAGES/DRESSINGS) ×5 IMPLANT
GLOVE SURG SIGNA 7.5 PF LTX (GLOVE) ×5 IMPLANT
GOWN STRL REUS W/ TWL LRG LVL3 (GOWN DISPOSABLE) ×3 IMPLANT
GOWN STRL REUS W/ TWL XL LVL3 (GOWN DISPOSABLE) ×3 IMPLANT
GOWN STRL REUS W/TWL LRG LVL3 (GOWN DISPOSABLE) ×2
GOWN STRL REUS W/TWL XL LVL3 (GOWN DISPOSABLE) ×2
KIT BASIN OR (CUSTOM PROCEDURE TRAY) ×5 IMPLANT
KIT TURNOVER KIT B (KITS) ×5 IMPLANT
NEEDLE HYPO 25GX1X1/2 BEV (NEEDLE) ×5 IMPLANT
NS IRRIG 1000ML POUR BTL (IV SOLUTION) ×5 IMPLANT
PACK SURGICAL SETUP 50X90 (CUSTOM PROCEDURE TRAY) ×5 IMPLANT
PAD ARMBOARD 7.5X6 YLW CONV (MISCELLANEOUS) ×5 IMPLANT
PENCIL BUTTON HOLSTER BLD 10FT (ELECTRODE) ×5 IMPLANT
SPECIMEN JAR SMALL (MISCELLANEOUS) ×5 IMPLANT
SPONGE LAP 18X18 X RAY DECT (DISPOSABLE) ×5 IMPLANT
SUT MNCRL AB 4-0 PS2 18 (SUTURE) ×5 IMPLANT
SUT NOVA NAB DX-16 0-1 5-0 T12 (SUTURE) ×5 IMPLANT
SUT VIC AB 3-0 SH 27 (SUTURE) ×4
SUT VIC AB 3-0 SH 27X BRD (SUTURE) ×3 IMPLANT
SUT VIC AB 3-0 SH 27XBRD (SUTURE) ×3 IMPLANT
SYR BULB 3OZ (MISCELLANEOUS) ×5 IMPLANT
SYR CONTROL 10ML LL (SYRINGE) ×5 IMPLANT
TOWEL OR 17X24 6PK STRL BLUE (TOWEL DISPOSABLE) ×5 IMPLANT
TOWEL OR 17X26 10 PK STRL BLUE (TOWEL DISPOSABLE) ×5 IMPLANT
TUBE CONNECTING 12'X1/4 (SUCTIONS)
TUBE CONNECTING 12X1/4 (SUCTIONS) IMPLANT
YANKAUER SUCT BULB TIP NO VENT (SUCTIONS) ×5 IMPLANT

## 2018-03-30 NOTE — Anesthesia Procedure Notes (Signed)
Procedure Name: LMA Insertion Date/Time: 03/30/2018 8:38 AM Performed by: Bryson Corona, CRNA Pre-anesthesia Checklist: Patient identified, Emergency Drugs available, Suction available and Patient being monitored Patient Re-evaluated:Patient Re-evaluated prior to induction Oxygen Delivery Method: Circle System Utilized Preoxygenation: Pre-oxygenation with 100% oxygen Induction Type: IV induction Ventilation: Mask ventilation without difficulty LMA: LMA inserted LMA Size: 5.0 Number of attempts: 1 Placement Confirmation: positive ETCO2 Tube secured with: Tape Dental Injury: Teeth and Oropharynx as per pre-operative assessment

## 2018-03-30 NOTE — Discharge Instructions (Signed)
[facility] _______[facility]  UMBILICAL OR INGUINAL HERNIA REPAIR: POST OP INSTRUCTIONS  Always review your discharge instruction sheet given to you by the facility where your surgery was performed. IF YOU HAVE DISABILITY OR FAMILY LEAVE FORMS, YOU MUST BRING THEM TO THE OFFICE FOR PROCESSING.   DO NOT GIVE THEM TO YOUR DOCTOR.  1. A  prescription for pain medication may be given to you upon discharge.  Take your pain medication as prescribed, if needed.  If narcotic pain medicine is not needed, then you may take acetaminophen (Tylenol) or ibuprofen (Advil) as needed. 2. Take your usually prescribed medications unless otherwise directed. If you need a refill on your pain medication, please contact your pharmacy.  They will contact our office to request authorization. Prescriptions will not be filled after 5 pm or on week-ends. 3. You should follow a light diet the first 24 hours after arrival home, such as soup and crackers, etc.  Be sure to include lots of fluids daily.  Resume your normal diet the day after surgery. 4.Most patients will experience some swelling and bruising around the umbilicus or in the groin and scrotum.  Ice packs and reclining will help.  Swelling and bruising can take several days to resolve.  6. It is common to experience some constipation if taking pain medication after surgery.  Increasing fluid intake and taking a stool softener (such as Colace) will usually help or prevent this problem from occurring.  A mild laxative (Milk of Magnesia or Miralax) should be taken according to package directions if there are no bowel movements after 48 hours. 7. Unless discharge instructions indicate otherwise, you may remove your bandages 24-48 hours after surgery, and you may shower at that time.  You may have steri-strips (small skin tapes) in place directly over the incision.  These strips should be left on the skin for 7-10 days.  If your surgeon used skin glue on the  incision, you may shower in 24 hours.  The glue will flake off over the next 2-3 weeks.  Any sutures or staples will be removed at the office during your follow-up visit. 8. ACTIVITIES:  You may resume regular (light) daily activities beginning the next day--such as daily self-care, walking, climbing stairs--gradually increasing activities as tolerated.  You may have sexual intercourse when it is comfortable.  Refrain from any heavy lifting or straining until approved by your doctor.  a.You may drive when you are no longer taking prescription pain medication, you can comfortably wear a seatbelt, and you can safely maneuver your car and apply brakes. b.RETURN TO WORK:   _____________________________________________  9.You should see your doctor in the office for a follow-up appointment approximately 2-3 weeks after your surgery.  Make sure that you call for this appointment within a day or two after you arrive home to insure a convenient appointment time. 10.OTHER INSTRUCTIONS: _OK TO SHOWER STARTING TOMORROW ICE PACK AND TYLENOL ALSO FOR PAIN NO LIFTING OVER 15 POUNDS FOR 3 WEEKS________________________    _____________________________________  WHEN TO CALL YOUR DOCTOR: 1. Fever over 101.0 2. Inability to urinate 3. Nausea and/or vomiting 4. Extreme swelling or bruising 5. Continued bleeding from incision. 6. Increased pain, redness, or drainage from the incision  The clinic staff is available to answer your questions during regular business hours.  Please dont hesitate to call and ask to speak to one of the nurses for clinical concerns.  If you have a medical emergency, go to the nearest emergency room  or call 911.  A surgeon from [facility] is always on call at the hospital   [address], [place], [place], [place]  [address] ?  [address], [place], [place]   [address] [phone] ? [phone] ? FAX [phone] Web site: [email or web address]

## 2018-03-30 NOTE — Transfer of Care (Signed)
Immediate Anesthesia Transfer of Care Note  Patient: Jason [name]  Procedure(s) Performed: HERNIA REPAIR UMBILICAL (N/A Abdomen) EXCISION RIGHT FLANK  MASS (Right Flank)  Patient Location: PACU  Anesthesia Type:General  Level of Consciousness: drowsy  Airway & Oxygen Therapy: Patient Spontanous Breathing and Patient connected to nasal cannula oxygen  Post-op Assessment: Report given to RN and Post -op Vital signs reviewed and stable  Post vital signs: Reviewed and stable  Last Vitals:  Vitals Value Taken Time  BP 131/64 03/30/2018  9:18 AM  Temp    Pulse 56 03/30/2018  9:18 AM  Resp 11 03/30/2018  9:18 AM  SpO2 97 % 03/30/2018  9:18 AM  Vitals shown include unvalidated device data.  Last Pain:  Vitals:   03/30/18 0704  TempSrc:   PainSc: 0-No pain      Patients Stated Pain Goal: 0 (47/34/03 7096)  Complications: No apparent anesthesia complications

## 2018-03-30 NOTE — Anesthesia Preprocedure Evaluation (Signed)
Anesthesia Evaluation  Patient identified by MRN, date of birth, ID band Patient awake    Reviewed: Allergy & Precautions, NPO status , Patient's Chart, lab work & pertinent test results  Airway Mallampati: II  TM Distance: >3 FB Neck ROM: Full    Dental no notable dental hx.    Pulmonary former smoker,    Pulmonary exam normal breath sounds clear to auscultation       Cardiovascular hypertension, Pt. on medications Normal cardiovascular exam+ dysrhythmias Atrial Fibrillation + Valvular Problems/Murmurs (s/p mvr avr pig valve)  Rhythm:Regular Rate:Normal     Neuro/Psych negative neurological ROS  negative psych ROS   GI/Hepatic negative GI ROS, Neg liver ROS, neg GERD  ,  Endo/Other  negative endocrine ROS  Renal/GU negative Renal ROS  negative genitourinary   Musculoskeletal negative musculoskeletal ROS (+)   Abdominal   Peds negative pediatric ROS (+)  Hematology negative hematology ROS (+)   Anesthesia Other Findings   Reproductive/Obstetrics negative OB ROS                             Anesthesia Physical Anesthesia Plan  ASA: III  Anesthesia Plan: General   Post-op Pain Management:    Induction: Intravenous  PONV Risk Score and Plan: 2 and Ondansetron and Treatment may vary due to age or medical condition  Airway Management Planned: Oral ETT  Additional Equipment:   Intra-op Plan:   Post-operative Plan: Extubation in OR  Informed Consent: I have reviewed the patients History and Physical, chart, labs and discussed the procedure including the risks, benefits and alternatives for the proposed anesthesia with the patient or authorized representative who has indicated his/her understanding and acceptance.   Dental advisory given  Plan Discussed with: CRNA  Anesthesia Plan Comments:         Anesthesia Quick Evaluation

## 2018-03-30 NOTE — Op Note (Signed)
HERNIA REPAIR UMBILICAL, EXCISION RIGHT FLANK  MASS  Procedure Note  Jason [name] 03/30/2018   Pre-op Diagnosis: umbilical hernia and right flank mass     Post-op Diagnosis: same  Procedure(s): HERNIA REPAIR UMBILICAL EXCISION RIGHT FLANK  MASS (7 cm)  Surgeon(s): Coralie Keens, MD  Anesthesia: General  Staff:  Circulator: Phillips Grout, RN Scrub Person: Nicholos Johns, RN  Estimated Blood Loss: Minimal               Specimens: sent to path  Procedure: The patient was brought to the operating room and identified as correct patient.  He was placed supine on the operating table general anesthesia was induced.  His abdomen was then prepped and draped in usual sterile fashion.  I anesthetized the skin over the large palpable right flank mass with Marcaine.  I made incision with a scalpel and then dissected down through the subcutaneous tissue to the mass.  The mass appeared consistent with a large lipoma.  It was greater than 7 cm in size.  It was easily excised with the cautery.  Hemostasis was achieved with the cautery.  I then closed the subcutaneous tissue with interrupted 3-0 Vicryl sutures and closed the skin with a running 4-0 Monocryl. Next I anesthetized the skin around the umbilicus with Marcaine.  I made a small transverse incision at the lower edge of the umbilicus.  I then dissected down to the hernia sac which was separated from the overlying umbilical skin.  I then excised the sac.  All contents have been reduced back to the abdominal cavity.  The fascial defect itself was proximally 1 cm in size.  I closed the fascial defect with 2 separate figure-of-eight #1 Novafil sutures.  I anesthetized the fascia with Marcaine.  I then tacked the umbilical skin back in place with 3-0 Vicryl suture.  I then closed the skin with a running 4-0 Monocryl.  Dermabond was placed to both incisions.  Patient tolerated the procedure well.  All the counts were correct at the end of the  procedure.  The patient was then extubated in the operating room and taken in a stable condition to the recovery room.          [name] A   Date: 03/30/2018  Time: 9:12 AM

## 2018-03-30 NOTE — Anesthesia Postprocedure Evaluation (Signed)
Anesthesia Post Note  Patient: KAYDYN SAYAS  Procedure(s) Performed: HERNIA REPAIR UMBILICAL (N/A Abdomen) EXCISION RIGHT FLANK  MASS (Right Flank)     Patient location during evaluation: PACU Anesthesia Type: General Level of consciousness: awake and alert Pain management: pain level controlled Vital Signs Assessment: post-procedure vital signs reviewed and stable Respiratory status: spontaneous breathing, nonlabored ventilation, respiratory function stable and patient connected to nasal cannula oxygen Cardiovascular status: blood pressure returned to baseline and stable Postop Assessment: no apparent nausea or vomiting Anesthetic complications: no    Last Vitals:  Vitals:   03/30/18 0944 03/30/18 0956  BP: 130/73 140/79  Pulse: 66 74  Resp: 15 16  Temp:  (!) 36.1 C  SpO2: 97% 93%    Last Pain:  Vitals:   03/30/18 0944  TempSrc:   PainSc: 0-No pain                 Montez Hageman

## 2018-03-30 NOTE — Interval H&P Note (Signed)
History and Physical Interval Note: no change in H and P  03/30/2018 7:45 AM  Jason [name]  has presented today for surgery, with the diagnosis of umbilical hernia right flank mass  The various methods of treatment have been discussed with the patient and family. After consideration of risks, benefits and other options for treatment, the patient has consented to  Procedure(s): HERNIA REPAIR UMBILICAL (N/A) INSERTION OF MESH (N/A) EXCISION RIGHT FLANK  MASS (Right) as a surgical intervention .  The patient's history has been reviewed, patient examined, no change in status, stable for surgery.  I have reviewed the patient's chart and labs.  Questions were answered to the patient's satisfaction.     Ahamed Hofland A

## 2018-03-31 ENCOUNTER — Encounter (HOSPITAL_COMMUNITY): Payer: Self-pay | Admitting: Surgery

## 2018-04-04 DIAGNOSIS — R972 Elevated prostate specific antigen [PSA]: Secondary | ICD-10-CM | POA: Diagnosis not present

## 2018-04-07 ENCOUNTER — Ambulatory Visit (INDEPENDENT_AMBULATORY_CARE_PROVIDER_SITE_OTHER): Payer: Medicare Other | Admitting: *Deleted

## 2018-04-07 DIAGNOSIS — I4819 Other persistent atrial fibrillation: Secondary | ICD-10-CM

## 2018-04-07 DIAGNOSIS — Z953 Presence of xenogenic heart valve: Secondary | ICD-10-CM | POA: Diagnosis not present

## 2018-04-07 DIAGNOSIS — Z952 Presence of prosthetic heart valve: Secondary | ICD-10-CM

## 2018-04-07 DIAGNOSIS — Z5181 Encounter for therapeutic drug level monitoring: Secondary | ICD-10-CM

## 2018-04-07 DIAGNOSIS — I481 Persistent atrial fibrillation: Secondary | ICD-10-CM | POA: Diagnosis not present

## 2018-04-07 LAB — POCT INR: INR: 1.7 — AB (ref 2.0–3.0)

## 2018-04-07 NOTE — Patient Instructions (Signed)
Description   Today June 27th  take 2 tablets then continue taking 1 tablet everyday except 1.5 tablets on Thursdays. Recheck INR in 2 weeks. Call  Coumadin Clinic [phone] with any concerns,  new medications or if scheduled for any procedures

## 2018-04-18 DIAGNOSIS — H353211 Exudative age-related macular degeneration, right eye, with active choroidal neovascularization: Secondary | ICD-10-CM | POA: Diagnosis not present

## 2018-04-18 DIAGNOSIS — H35351 Cystoid macular degeneration, right eye: Secondary | ICD-10-CM | POA: Diagnosis not present

## 2018-04-18 DIAGNOSIS — H43811 Vitreous degeneration, right eye: Secondary | ICD-10-CM | POA: Diagnosis not present

## 2018-04-18 DIAGNOSIS — H353223 Exudative age-related macular degeneration, left eye, with inactive scar: Secondary | ICD-10-CM | POA: Diagnosis not present

## 2018-04-21 ENCOUNTER — Ambulatory Visit (INDEPENDENT_AMBULATORY_CARE_PROVIDER_SITE_OTHER): Payer: Medicare Other | Admitting: *Deleted

## 2018-04-21 DIAGNOSIS — Z952 Presence of prosthetic heart valve: Secondary | ICD-10-CM

## 2018-04-21 DIAGNOSIS — Z953 Presence of xenogenic heart valve: Secondary | ICD-10-CM | POA: Diagnosis not present

## 2018-04-21 DIAGNOSIS — I4891 Unspecified atrial fibrillation: Secondary | ICD-10-CM

## 2018-04-21 DIAGNOSIS — Z5181 Encounter for therapeutic drug level monitoring: Secondary | ICD-10-CM | POA: Diagnosis not present

## 2018-04-21 LAB — POCT INR: INR: 2.5 (ref 2.0–3.0)

## 2018-04-21 NOTE — Patient Instructions (Signed)
Description   Continue taking 1 tablet everyday except 1.5 tablets on Thursdays. Recheck INR in 3 weeks. Call  Coumadin Clinic [phone] with any concerns,  new medications or if scheduled for any procedures

## 2018-05-12 ENCOUNTER — Ambulatory Visit (INDEPENDENT_AMBULATORY_CARE_PROVIDER_SITE_OTHER): Payer: Medicare Other | Admitting: *Deleted

## 2018-05-12 DIAGNOSIS — I4891 Unspecified atrial fibrillation: Secondary | ICD-10-CM

## 2018-05-12 DIAGNOSIS — Z953 Presence of xenogenic heart valve: Secondary | ICD-10-CM

## 2018-05-12 DIAGNOSIS — Z5181 Encounter for therapeutic drug level monitoring: Secondary | ICD-10-CM | POA: Diagnosis not present

## 2018-05-12 DIAGNOSIS — Z952 Presence of prosthetic heart valve: Secondary | ICD-10-CM | POA: Diagnosis not present

## 2018-05-12 LAB — POCT INR: INR: 2.7 (ref 2.0–3.0)

## 2018-05-12 NOTE — Patient Instructions (Signed)
Description   Continue taking 1 tablet everyday except 1.5 tablets on Thursdays. Recheck INR in 4 weeks. Call  Coumadin Clinic [phone] with any concerns,  new medications or if scheduled for any procedures

## 2018-06-01 DIAGNOSIS — H353211 Exudative age-related macular degeneration, right eye, with active choroidal neovascularization: Secondary | ICD-10-CM | POA: Diagnosis not present

## 2018-06-01 DIAGNOSIS — H35351 Cystoid macular degeneration, right eye: Secondary | ICD-10-CM | POA: Diagnosis not present

## 2018-06-01 DIAGNOSIS — H35051 Retinal neovascularization, unspecified, right eye: Secondary | ICD-10-CM | POA: Diagnosis not present

## 2018-06-01 DIAGNOSIS — H43811 Vitreous degeneration, right eye: Secondary | ICD-10-CM | POA: Diagnosis not present

## 2018-06-07 DIAGNOSIS — D1801 Hemangioma of skin and subcutaneous tissue: Secondary | ICD-10-CM | POA: Diagnosis not present

## 2018-06-07 DIAGNOSIS — D229 Melanocytic nevi, unspecified: Secondary | ICD-10-CM | POA: Diagnosis not present

## 2018-06-07 DIAGNOSIS — L821 Other seborrheic keratosis: Secondary | ICD-10-CM | POA: Diagnosis not present

## 2018-06-07 DIAGNOSIS — L814 Other melanin hyperpigmentation: Secondary | ICD-10-CM | POA: Diagnosis not present

## 2018-06-07 DIAGNOSIS — L570 Actinic keratosis: Secondary | ICD-10-CM | POA: Diagnosis not present

## 2018-06-07 DIAGNOSIS — L819 Disorder of pigmentation, unspecified: Secondary | ICD-10-CM | POA: Diagnosis not present

## 2018-06-09 ENCOUNTER — Ambulatory Visit (INDEPENDENT_AMBULATORY_CARE_PROVIDER_SITE_OTHER): Payer: Medicare Other | Admitting: Pharmacist

## 2018-06-09 DIAGNOSIS — I4891 Unspecified atrial fibrillation: Secondary | ICD-10-CM | POA: Diagnosis not present

## 2018-06-09 DIAGNOSIS — Z953 Presence of xenogenic heart valve: Secondary | ICD-10-CM

## 2018-06-09 DIAGNOSIS — Z5181 Encounter for therapeutic drug level monitoring: Secondary | ICD-10-CM | POA: Diagnosis not present

## 2018-06-09 DIAGNOSIS — Z952 Presence of prosthetic heart valve: Secondary | ICD-10-CM

## 2018-06-09 LAB — POCT INR: INR: 2 (ref 2.0–3.0)

## 2018-06-09 NOTE — Patient Instructions (Signed)
Description   Continue taking 1 tablet everyday except 1.5 tablets on Thursdays. Recheck INR in 4 weeks. Call  Coumadin Clinic [phone] with any concerns,  new medications or if scheduled for any procedures

## 2018-07-06 DIAGNOSIS — H353124 Nonexudative age-related macular degeneration, left eye, advanced atrophic with subfoveal involvement: Secondary | ICD-10-CM | POA: Diagnosis not present

## 2018-07-06 DIAGNOSIS — H35351 Cystoid macular degeneration, right eye: Secondary | ICD-10-CM | POA: Diagnosis not present

## 2018-07-06 DIAGNOSIS — H353211 Exudative age-related macular degeneration, right eye, with active choroidal neovascularization: Secondary | ICD-10-CM | POA: Diagnosis not present

## 2018-07-06 DIAGNOSIS — H35371 Puckering of macula, right eye: Secondary | ICD-10-CM | POA: Diagnosis not present

## 2018-07-06 DIAGNOSIS — H353223 Exudative age-related macular degeneration, left eye, with inactive scar: Secondary | ICD-10-CM | POA: Diagnosis not present

## 2018-07-06 DIAGNOSIS — H4051X3 Glaucoma secondary to other eye disorders, right eye, severe stage: Secondary | ICD-10-CM | POA: Diagnosis not present

## 2018-07-07 ENCOUNTER — Ambulatory Visit (INDEPENDENT_AMBULATORY_CARE_PROVIDER_SITE_OTHER): Payer: Medicare Other

## 2018-07-07 DIAGNOSIS — Z5181 Encounter for therapeutic drug level monitoring: Secondary | ICD-10-CM

## 2018-07-07 DIAGNOSIS — I4891 Unspecified atrial fibrillation: Secondary | ICD-10-CM | POA: Diagnosis not present

## 2018-07-07 DIAGNOSIS — Z952 Presence of prosthetic heart valve: Secondary | ICD-10-CM

## 2018-07-07 DIAGNOSIS — Z953 Presence of xenogenic heart valve: Secondary | ICD-10-CM

## 2018-07-07 LAB — POCT INR: INR: 2.2 (ref 2.0–3.0)

## 2018-07-07 NOTE — Patient Instructions (Signed)
Description   Continue taking 1 tablet everyday except 1.5 tablets on Thursdays. Recheck INR in 6 weeks. Call  Coumadin Clinic #[phone] with any concerns,  new medications or if scheduled for any procedures     

## 2018-07-29 DIAGNOSIS — E782 Mixed hyperlipidemia: Secondary | ICD-10-CM | POA: Diagnosis not present

## 2018-07-29 DIAGNOSIS — I4891 Unspecified atrial fibrillation: Secondary | ICD-10-CM | POA: Diagnosis not present

## 2018-07-29 DIAGNOSIS — I10 Essential (primary) hypertension: Secondary | ICD-10-CM | POA: Diagnosis not present

## 2018-07-29 DIAGNOSIS — Z23 Encounter for immunization: Secondary | ICD-10-CM | POA: Diagnosis not present

## 2018-08-10 DIAGNOSIS — H35371 Puckering of macula, right eye: Secondary | ICD-10-CM | POA: Diagnosis not present

## 2018-08-10 DIAGNOSIS — H43811 Vitreous degeneration, right eye: Secondary | ICD-10-CM | POA: Diagnosis not present

## 2018-08-10 DIAGNOSIS — H353211 Exudative age-related macular degeneration, right eye, with active choroidal neovascularization: Secondary | ICD-10-CM | POA: Diagnosis not present

## 2018-08-10 DIAGNOSIS — H35351 Cystoid macular degeneration, right eye: Secondary | ICD-10-CM | POA: Diagnosis not present

## 2018-08-18 ENCOUNTER — Ambulatory Visit (INDEPENDENT_AMBULATORY_CARE_PROVIDER_SITE_OTHER): Payer: Medicare Other | Admitting: Pharmacist

## 2018-08-18 DIAGNOSIS — Z5181 Encounter for therapeutic drug level monitoring: Secondary | ICD-10-CM | POA: Diagnosis not present

## 2018-08-18 DIAGNOSIS — I4891 Unspecified atrial fibrillation: Secondary | ICD-10-CM | POA: Diagnosis not present

## 2018-08-18 DIAGNOSIS — Z953 Presence of xenogenic heart valve: Secondary | ICD-10-CM

## 2018-08-18 DIAGNOSIS — Z952 Presence of prosthetic heart valve: Secondary | ICD-10-CM | POA: Diagnosis not present

## 2018-08-18 LAB — POCT INR: INR: 2.1 (ref 2.0–3.0)

## 2018-08-18 NOTE — Patient Instructions (Signed)
Description   Continue taking 1 tablet everyday except 1.5 tablets on Thursdays. Recheck INR in 6 weeks. Call  Coumadin Clinic #[phone] with any concerns,  new medications or if scheduled for any procedures     

## 2018-09-14 DIAGNOSIS — H353223 Exudative age-related macular degeneration, left eye, with inactive scar: Secondary | ICD-10-CM | POA: Diagnosis not present

## 2018-09-14 DIAGNOSIS — H353211 Exudative age-related macular degeneration, right eye, with active choroidal neovascularization: Secondary | ICD-10-CM | POA: Diagnosis not present

## 2018-09-29 ENCOUNTER — Ambulatory Visit (INDEPENDENT_AMBULATORY_CARE_PROVIDER_SITE_OTHER): Payer: Medicare Other | Admitting: *Deleted

## 2018-09-29 DIAGNOSIS — I4891 Unspecified atrial fibrillation: Secondary | ICD-10-CM | POA: Diagnosis not present

## 2018-09-29 DIAGNOSIS — Z953 Presence of xenogenic heart valve: Secondary | ICD-10-CM | POA: Diagnosis not present

## 2018-09-29 DIAGNOSIS — Z5181 Encounter for therapeutic drug level monitoring: Secondary | ICD-10-CM | POA: Diagnosis not present

## 2018-09-29 DIAGNOSIS — Z952 Presence of prosthetic heart valve: Secondary | ICD-10-CM | POA: Diagnosis not present

## 2018-09-29 LAB — POCT INR: INR: 2.5 (ref 2.0–3.0)

## 2018-09-29 NOTE — Patient Instructions (Signed)
Description   Continue taking 1 tablet everyday except 1.5 tablets on Thursdays. Recheck INR in 6 weeks. Call  Coumadin Clinic #[phone] with any concerns,  new medications or if scheduled for any procedures     

## 2018-10-18 ENCOUNTER — Ambulatory Visit: Payer: Medicare Other | Admitting: Interventional Cardiology

## 2018-10-26 DIAGNOSIS — H35351 Cystoid macular degeneration, right eye: Secondary | ICD-10-CM | POA: Diagnosis not present

## 2018-10-26 DIAGNOSIS — H35371 Puckering of macula, right eye: Secondary | ICD-10-CM | POA: Diagnosis not present

## 2018-10-26 DIAGNOSIS — H353211 Exudative age-related macular degeneration, right eye, with active choroidal neovascularization: Secondary | ICD-10-CM | POA: Diagnosis not present

## 2018-10-26 DIAGNOSIS — H43811 Vitreous degeneration, right eye: Secondary | ICD-10-CM | POA: Diagnosis not present

## 2018-11-10 ENCOUNTER — Ambulatory Visit (INDEPENDENT_AMBULATORY_CARE_PROVIDER_SITE_OTHER): Payer: Medicare Other | Admitting: Pharmacist

## 2018-11-10 DIAGNOSIS — Z952 Presence of prosthetic heart valve: Secondary | ICD-10-CM | POA: Diagnosis not present

## 2018-11-10 DIAGNOSIS — Z953 Presence of xenogenic heart valve: Secondary | ICD-10-CM | POA: Diagnosis not present

## 2018-11-10 DIAGNOSIS — Z5181 Encounter for therapeutic drug level monitoring: Secondary | ICD-10-CM

## 2018-11-10 DIAGNOSIS — I4891 Unspecified atrial fibrillation: Secondary | ICD-10-CM

## 2018-11-10 LAB — POCT INR: INR: 2.5 (ref 2.0–3.0)

## 2018-11-10 NOTE — Patient Instructions (Signed)
Description   Continue taking 1 tablet everyday except 1.5 tablets on Thursdays. Recheck INR in 6 weeks. Call  Coumadin Clinic #[phone] with any concerns,  new medications or if scheduled for any procedures     

## 2018-12-07 DIAGNOSIS — H353223 Exudative age-related macular degeneration, left eye, with inactive scar: Secondary | ICD-10-CM | POA: Diagnosis not present

## 2018-12-07 DIAGNOSIS — H353211 Exudative age-related macular degeneration, right eye, with active choroidal neovascularization: Secondary | ICD-10-CM | POA: Diagnosis not present

## 2018-12-07 DIAGNOSIS — H353124 Nonexudative age-related macular degeneration, left eye, advanced atrophic with subfoveal involvement: Secondary | ICD-10-CM | POA: Diagnosis not present

## 2018-12-08 DIAGNOSIS — L570 Actinic keratosis: Secondary | ICD-10-CM | POA: Diagnosis not present

## 2018-12-08 DIAGNOSIS — L814 Other melanin hyperpigmentation: Secondary | ICD-10-CM | POA: Diagnosis not present

## 2018-12-08 DIAGNOSIS — D229 Melanocytic nevi, unspecified: Secondary | ICD-10-CM | POA: Diagnosis not present

## 2018-12-08 DIAGNOSIS — L819 Disorder of pigmentation, unspecified: Secondary | ICD-10-CM | POA: Diagnosis not present

## 2018-12-08 DIAGNOSIS — L821 Other seborrheic keratosis: Secondary | ICD-10-CM | POA: Diagnosis not present

## 2018-12-08 DIAGNOSIS — L820 Inflamed seborrheic keratosis: Secondary | ICD-10-CM | POA: Diagnosis not present

## 2018-12-08 DIAGNOSIS — D1801 Hemangioma of skin and subcutaneous tissue: Secondary | ICD-10-CM | POA: Diagnosis not present

## 2018-12-08 DIAGNOSIS — Z85828 Personal history of other malignant neoplasm of skin: Secondary | ICD-10-CM | POA: Diagnosis not present

## 2018-12-14 ENCOUNTER — Encounter: Payer: Self-pay | Admitting: Interventional Cardiology

## 2018-12-14 ENCOUNTER — Ambulatory Visit (INDEPENDENT_AMBULATORY_CARE_PROVIDER_SITE_OTHER): Payer: Medicare Other | Admitting: Interventional Cardiology

## 2018-12-14 ENCOUNTER — Ambulatory Visit (INDEPENDENT_AMBULATORY_CARE_PROVIDER_SITE_OTHER): Payer: Medicare Other | Admitting: *Deleted

## 2018-12-14 VITALS — BP 110/66 | HR 82 | Ht 68.0 in | Wt 173.1 lb

## 2018-12-14 DIAGNOSIS — Z953 Presence of xenogenic heart valve: Secondary | ICD-10-CM | POA: Diagnosis not present

## 2018-12-14 DIAGNOSIS — I340 Nonrheumatic mitral (valve) insufficiency: Secondary | ICD-10-CM | POA: Diagnosis not present

## 2018-12-14 DIAGNOSIS — I4819 Other persistent atrial fibrillation: Secondary | ICD-10-CM | POA: Diagnosis not present

## 2018-12-14 DIAGNOSIS — Z952 Presence of prosthetic heart valve: Secondary | ICD-10-CM | POA: Diagnosis not present

## 2018-12-14 DIAGNOSIS — Z5181 Encounter for therapeutic drug level monitoring: Secondary | ICD-10-CM | POA: Diagnosis not present

## 2018-12-14 DIAGNOSIS — I4891 Unspecified atrial fibrillation: Secondary | ICD-10-CM

## 2018-12-14 LAB — POCT INR: INR: 2.3 (ref 2.0–3.0)

## 2018-12-14 NOTE — Patient Instructions (Signed)
Medication Instructions:  Your physician recommends that you continue on your current medications as directed. Please refer to the Current Medication list given to you today.  If you need a refill on your cardiac medications before your next appointment, please call your pharmacy.   Lab work: None Ordered  If you have labs (blood work) drawn today and your tests are completely normal, you will receive your results only by: . MyChart Message (if you have MyChart) OR . A paper copy in the mail If you have any lab test that is abnormal or we need to change your treatment, we will call you to review the results.  Testing/Procedures: Your physician has requested that you have an echocardiogram. Echocardiography is a painless test that uses sound waves to create images of your heart. It provides your doctor with information about the size and shape of your heart and how well your heart's chambers and valves are working. This procedure takes approximately one hour. There are no restrictions for this procedure.  Follow-Up: At [facility], you and your health needs are our priority.  As part of our continuing mission to provide you with exceptional heart care, we have created designated Provider Care Teams.  These Care Teams include your primary Cardiologist (physician) and Advanced Practice Providers (APPs -  Physician Assistants and Nurse Practitioners) who all work together to provide you with the care you need, when you need it. . You will need a follow up appointment in 1 year.  Please call our office 2 months in advance to schedule this appointment.  You may see Jay Varanasi, MD or one of the following Advanced Practice Providers on your designated Care Team:   . Brittainy Simmons, PA-C . Dayna Dunn, PA-C . Michele Lenze, PA-C  Any Other Special Instructions Will Be Listed Below (If Applicable).    

## 2018-12-14 NOTE — Progress Notes (Signed)
Cardiology Office Note   Date:  12/14/2018   ID:  [name], DOB [birth year], MRN [id number]  PCP:  Shirline Frees, MD    No chief complaint on file.  Mitral regurg/AFib/ Aortic stenosis  Wt Readings from Last 3 Encounters:  12/14/18 173 lb 1.9 oz (78.5 kg)  03/30/18 175 lb 12.8 oz (79.7 kg)  03/23/18 175 lb 12.8 oz (79.7 kg)       History of Present Illness: Jason [name] is a 85 y.o. male  who has had valvular heart disease and atrial fibrillation. He underwent aortic valve and mitral valve replacement in 11/14.  He was initially supposed to have a Maze procedure along with his valve surgery. Due to scar tissue from his initial surgery 9 years ago, he did not have a maze done. He has been rate controlled I the past, and anticoagulated.   He was told his kidney function was abnormalin 2016.   He has intentionally lost weightin 2016through portion control and exercise.He hasmaintain this weight loss.   Denies : Chest pain. Dizziness. Leg edema. Nitroglycerin use. Orthopnea. Palpitations. Paroxysmal nocturnal dyspnea.. Syncope.   Exercise has dropped off since the weather got colder.    He had a lipoma reomved and a hernia repair in 2019.  No cardiac issues.     Past Medical History:  Diagnosis Date  . AK (actinic keratosis)   . Atrial fibrillation, persistent    chronic coumadin therapy  . Blindness    left eye  . Coronary artery disease    20% RCA by 2014 cath  . Elevated PSA   . GERD (gastroesophageal reflux disease)   . H/O hiatal hernia   . Heart murmur   . History of kidney stones   . Hydrocele    left  . Hyperlipidemia   . Hypertension   . Internal hemorrhoids   . Kidney stones    kidney stones  . Macular degeneration    right eye  . Mitral regurgitation 02/25/2011   Recurrent MR s/p mitral valve repair   . OA (osteoarthritis) of neck   . S/P aortic valve replacement with bioprosthetic valve 08/15/2013   59mm Edwards Magna Ease bovine  pericardial tissue valve  . S/P mitral valve repair 09/15/2004   Complex valvuloplasty including artificial Goretex neocord placement x4 and 65mm SARP ring annuloplasty via right mini thoracotomy approach - Dr Evelina Dun @ [facility]  . S/P redo mitral valve replacement and aortic valve replacement with bioprosthetic valves 08/15/2013   11mm Edwards Magna Mitral bovine pericardial tissue valve  . Severe aortic stenosis 11/14   tissue AVR   . Vocal cord paralysis     Past Surgical History:  Procedure Laterality Date  . AORTIC VALVE REPLACEMENT N/A 08/15/2013   Procedure: AORTIC VALVE REPLACEMENT (AVR);  Surgeon: Rexene Alberts, MD;  Location: [place];  Service: Open Heart Surgery;  Laterality: N/A;  . ESOPHAGOGASTRODUODENOSCOPY N/A 08/02/2013   Procedure: ESOPHAGOGASTRODUODENOSCOPY (EGD);  Surgeon: Casandra Doffing, MD;  Location: [facility] ENDOSCOPY;  Service: Cardiovascular;  Laterality: N/A;  . EYE SURGERY Right    infection  . GROIN MASS OPEN BIOPSY  2012  . HEMORRHOID SURGERY     pt denies.  . INGUINAL HERNIA REPAIR  08/2011  . INTRAOPERATIVE TRANSESOPHAGEAL ECHOCARDIOGRAM N/A 08/15/2013   Procedure: INTRAOPERATIVE TRANSESOPHAGEAL ECHOCARDIOGRAM;  Surgeon: Rexene Alberts, MD;  Location: [place];  Service: Open Heart Surgery;  Laterality: N/A;  . LEFT AND RIGHT HEART CATHETERIZATION WITH CORONARY ANGIOGRAM N/A  07/05/2013   Procedure: LEFT AND RIGHT HEART CATHETERIZATION WITH CORONARY ANGIOGRAM;  Surgeon: Jettie Booze, MD;  Location: [facility] CATH LAB;  Service: Cardiovascular;  Laterality: N/A;  . MASS EXCISION Right 03/30/2018   Procedure: EXCISION RIGHT FLANK  MASS;  Surgeon: Coralie Keens, MD;  Location: [place];  Service: General;  Laterality: Right;  . MITRAL VALVE REPAIR  09/16/2011   @ [facility], Dr Evelina Dun  . MITRAL VALVE REPAIR N/A 08/15/2013   Procedure: REDO MITRAL VALVE (MV) REPLACEMENT;  Surgeon: Rexene Alberts, MD;  Location: [place];  Service: Open Heart Surgery;  Laterality: N/A;  . TEE WITHOUT  CARDIOVERSION N/A 07/05/2013   Procedure: TRANSESOPHAGEAL ECHOCARDIOGRAM (TEE);  Surgeon: Casandra Doffing, MD;  Location: [facility] ENDOSCOPY;  Service: Cardiovascular;  Laterality: N/A;  . TEE WITHOUT CARDIOVERSION N/A 08/02/2013   Procedure: TRANSESOPHAGEAL ECHOCARDIOGRAM (TEE);  Surgeon: Casandra Doffing, MD;  Location: [place];  Service: Cardiovascular;  Laterality: N/A;  . UMBILICAL HERNIA REPAIR N/A 03/30/2018   Procedure: HERNIA REPAIR UMBILICAL;  Surgeon: Coralie Keens, MD;  Location: [place];  Service: General;  Laterality: N/A;     Current Outpatient Medications  Medication Sig Dispense Refill  . acetaminophen (TYLENOL) 650 MG CR tablet Take 650 mg by mouth 2 (two) times daily.     . ALPHAGAN P 0.1 % SOLN Place 1 drop 2 (two) times daily into both eyes.  3  . Ascorbic Acid (VITAMIN C) 1000 MG tablet Take 1,000 mg by mouth daily.    Marland Kitchen atorvastatin (LIPITOR) 20 MG tablet Take 20 mg by mouth at bedtime.     Marland Kitchen b complex vitamins tablet Take 1 tablet by mouth daily.    Marland Kitchen CARTIA XT 240 MG 24 hr capsule Take 240 mg by mouth every evening.     . clindamycin (CLEOCIN) 300 MG capsule Take 2 capsules (600mg ) by mouth once 30-60 minutes prior to abdominal surgery. 2 capsule 1  . furosemide (LASIX) 40 MG tablet 40 mg in the am and 20 mg in the pm (Patient taking differently: Take 20-40 mg by mouth See admin instructions. Take 1 tablet (40 mg) in the morning & 0.5 tablet (20 mg) in the afternoon.) 30 tablet   . lisinopril (PRINIVIL,ZESTRIL) 20 MG tablet Take 10 mg by mouth every evening.   1  . metoprolol succinate (TOPROL-XL) 25 MG 24 hr tablet Take 12.5 mg by mouth 2 (two) times daily.  0  . Multiple Vitamins-Minerals (PRESERVISION AREDS PO) Take 1 tablet by mouth 2 (two) times daily.     Marland Kitchen warfarin (COUMADIN) 5 MG tablet TAKE AS DIRECTED BY COUMADIN CLINIC (Patient taking differently: TAKE 1 TABLET (5 MG) BY MOUTH DAILY, EXCEPT TAKE 1.5 TABLETS (7.5 MG) BY MOUTH ON THURSDAYS.) 120 tablet 1   No current  facility-administered medications for this visit.     Allergies:   Penicillins    Social History:  The patient  reports that he quit smoking about 40 years ago. His smoking use included cigarettes. He quit after 8.00 years of use. He has never used smokeless tobacco. He reports that he does not drink alcohol or use drugs.   Family History:  The patient's family history includes Cancer in his father; Diabetes in his father and mother; Heart attack in his mother; Hyperlipidemia in his father and mother; Hypertension in his brother, father, and mother; Stroke in his father and mother.    ROS:  Please see the history of present illness.   Otherwise, review of systems are  positive for getting less exercise.   All other systems are reviewed and negative.    PHYSICAL EXAM: VS:  BP 110/66   Pulse 82   Ht 5\' 8"  (1.727 m)   Wt 173 lb 1.9 oz (78.5 kg)   SpO2 93%   BMI 26.32 kg/m  , BMI Body mass index is 26.32 kg/m. GEN: Well nourished, well developed, in no acute distress  HEENT: normal  Neck: no JVD, carotid bruits, or masses Cardiac: irregularly irreguar; no murmurs, rubs, or gallops,no edema  Respiratory:  clear to auscultation bilaterally, normal work of breathing GI: soft, nontender, nondistended, + BS MS: no deformity or atrophy  Skin: warm and dry, no rash Neuro:  Strength and sensation are intact Psych: euthymic mood, full affect   EKG:   The ekg ordered today demonstrates AFib, RBBB   Recent Labs: 03/23/2018: BUN 18; Creatinine, Ser 1.32; Hemoglobin 14.5; Platelets 185; Potassium 4.1; Sodium 142   Lipid Panel No results found for: CHOL, TRIG, HDL, CHOLHDL, VLDL, LDLCALC, LDLDIRECT   Other studies Reviewed: Additional studies/ records that were reviewed today with results demonstrating: labs reviewed, 2014 echo reviewed.   ASSESSMENT AND PLAN:  1. AFIB: rate controlled.  Warfarin for stroke prevention. No bleeding problems.  2. Anticoagulated: Avoid falling 3. Mitral  regurgitation: Prior repair. Continue SBE prophylaxis. No sign of CHF. Will check echo.  4. Aortic stenosis:  Treated with bioprosthetic valve. 5. LE edema: Resolved. 6. CKD: 1.19 Cr at last check.    Current medicines are reviewed at length with the patient today.  The patient concerns regarding his medicines were addressed.  The following changes have been made:  No change  Labs/ tests ordered today include:   Orders Placed This Encounter  Procedures  . EKG 12-Lead  . ECHOCARDIOGRAM COMPLETE    Recommend 150 minutes/week of aerobic exercise Low fat, low carb, high fiber diet recommended  Disposition:   FU in 1 year   Signed, Larae Grooms, MD  12/14/2018 5:56 PM    [facility] [place], [place], [place]  [address] Phone: [phone]; Fax: [phone]

## 2018-12-14 NOTE — Patient Instructions (Signed)
Description   Continue taking 1 tablet everyday except 1.5 tablets on Thursdays. Recheck INR in 6 weeks. Call  Coumadin Clinic #[phone] with any concerns,  new medications or if scheduled for any procedures     

## 2018-12-27 ENCOUNTER — Other Ambulatory Visit: Payer: Self-pay

## 2018-12-27 ENCOUNTER — Ambulatory Visit (HOSPITAL_COMMUNITY): Payer: Medicare Other | Attending: Cardiovascular Disease

## 2018-12-27 DIAGNOSIS — Z952 Presence of prosthetic heart valve: Secondary | ICD-10-CM | POA: Insufficient documentation

## 2019-01-18 DIAGNOSIS — H35351 Cystoid macular degeneration, right eye: Secondary | ICD-10-CM | POA: Diagnosis not present

## 2019-01-18 DIAGNOSIS — H353223 Exudative age-related macular degeneration, left eye, with inactive scar: Secondary | ICD-10-CM | POA: Diagnosis not present

## 2019-01-18 DIAGNOSIS — H353211 Exudative age-related macular degeneration, right eye, with active choroidal neovascularization: Secondary | ICD-10-CM | POA: Diagnosis not present

## 2019-01-24 ENCOUNTER — Telehealth: Payer: Self-pay

## 2019-01-24 NOTE — Telephone Encounter (Signed)

## 2019-01-25 ENCOUNTER — Other Ambulatory Visit: Payer: Self-pay

## 2019-01-25 ENCOUNTER — Ambulatory Visit (INDEPENDENT_AMBULATORY_CARE_PROVIDER_SITE_OTHER): Payer: Medicare Other | Admitting: *Deleted

## 2019-01-25 DIAGNOSIS — Z5181 Encounter for therapeutic drug level monitoring: Secondary | ICD-10-CM | POA: Diagnosis not present

## 2019-01-25 DIAGNOSIS — Z953 Presence of xenogenic heart valve: Secondary | ICD-10-CM

## 2019-01-25 DIAGNOSIS — Z952 Presence of prosthetic heart valve: Secondary | ICD-10-CM | POA: Diagnosis not present

## 2019-01-25 DIAGNOSIS — I4891 Unspecified atrial fibrillation: Secondary | ICD-10-CM

## 2019-01-25 LAB — POCT INR: INR: 2.5 (ref 2.0–3.0)

## 2019-03-08 DIAGNOSIS — H353223 Exudative age-related macular degeneration, left eye, with inactive scar: Secondary | ICD-10-CM | POA: Diagnosis not present

## 2019-03-08 DIAGNOSIS — H353124 Nonexudative age-related macular degeneration, left eye, advanced atrophic with subfoveal involvement: Secondary | ICD-10-CM | POA: Diagnosis not present

## 2019-03-08 DIAGNOSIS — H353211 Exudative age-related macular degeneration, right eye, with active choroidal neovascularization: Secondary | ICD-10-CM | POA: Diagnosis not present

## 2019-03-08 DIAGNOSIS — H401131 Primary open-angle glaucoma, bilateral, mild stage: Secondary | ICD-10-CM | POA: Diagnosis not present

## 2019-03-29 ENCOUNTER — Telehealth: Payer: Self-pay

## 2019-03-29 NOTE — Telephone Encounter (Signed)

## 2019-04-05 ENCOUNTER — Other Ambulatory Visit: Payer: Self-pay

## 2019-04-05 ENCOUNTER — Ambulatory Visit (INDEPENDENT_AMBULATORY_CARE_PROVIDER_SITE_OTHER): Payer: Medicare Other | Admitting: *Deleted

## 2019-04-05 DIAGNOSIS — Z5181 Encounter for therapeutic drug level monitoring: Secondary | ICD-10-CM

## 2019-04-05 DIAGNOSIS — Z952 Presence of prosthetic heart valve: Secondary | ICD-10-CM

## 2019-04-05 DIAGNOSIS — Z953 Presence of xenogenic heart valve: Secondary | ICD-10-CM | POA: Diagnosis not present

## 2019-04-05 LAB — POCT INR: INR: 2.6 (ref 2.0–3.0)

## 2019-04-05 NOTE — Patient Instructions (Signed)
Description   Continue taking 1 tablet everyday except 1.5 tablets on Thursdays. Recheck INR in 8 weeks. Call  Coumadin Clinic [phone] with any concerns,  new medications or if scheduled for any procedures

## 2019-04-19 DIAGNOSIS — H43811 Vitreous degeneration, right eye: Secondary | ICD-10-CM | POA: Diagnosis not present

## 2019-04-19 DIAGNOSIS — H401131 Primary open-angle glaucoma, bilateral, mild stage: Secondary | ICD-10-CM | POA: Diagnosis not present

## 2019-04-19 DIAGNOSIS — H353223 Exudative age-related macular degeneration, left eye, with inactive scar: Secondary | ICD-10-CM | POA: Diagnosis not present

## 2019-04-19 DIAGNOSIS — H353211 Exudative age-related macular degeneration, right eye, with active choroidal neovascularization: Secondary | ICD-10-CM | POA: Diagnosis not present

## 2019-05-09 DIAGNOSIS — E782 Mixed hyperlipidemia: Secondary | ICD-10-CM | POA: Diagnosis not present

## 2019-05-09 DIAGNOSIS — I10 Essential (primary) hypertension: Secondary | ICD-10-CM | POA: Diagnosis not present

## 2019-05-09 DIAGNOSIS — I4891 Unspecified atrial fibrillation: Secondary | ICD-10-CM | POA: Diagnosis not present

## 2019-05-31 DIAGNOSIS — H35351 Cystoid macular degeneration, right eye: Secondary | ICD-10-CM | POA: Diagnosis not present

## 2019-05-31 DIAGNOSIS — H35051 Retinal neovascularization, unspecified, right eye: Secondary | ICD-10-CM | POA: Diagnosis not present

## 2019-05-31 DIAGNOSIS — H353211 Exudative age-related macular degeneration, right eye, with active choroidal neovascularization: Secondary | ICD-10-CM | POA: Diagnosis not present

## 2019-05-31 DIAGNOSIS — H43811 Vitreous degeneration, right eye: Secondary | ICD-10-CM | POA: Diagnosis not present

## 2019-06-01 ENCOUNTER — Other Ambulatory Visit: Payer: Self-pay

## 2019-06-01 ENCOUNTER — Ambulatory Visit (INDEPENDENT_AMBULATORY_CARE_PROVIDER_SITE_OTHER): Payer: Medicare Other | Admitting: *Deleted

## 2019-06-01 DIAGNOSIS — Z5181 Encounter for therapeutic drug level monitoring: Secondary | ICD-10-CM

## 2019-06-01 DIAGNOSIS — I4819 Other persistent atrial fibrillation: Secondary | ICD-10-CM | POA: Diagnosis not present

## 2019-06-01 DIAGNOSIS — Z952 Presence of prosthetic heart valve: Secondary | ICD-10-CM | POA: Diagnosis not present

## 2019-06-01 LAB — POCT INR: INR: 4.4 — AB (ref 2.0–3.0)

## 2019-06-01 NOTE — Patient Instructions (Signed)
Description   Hold coumadin today and tomorrow, then continue taking 1 tablet everyday except 1.5 tablets on Thursdays. Recheck INR in 2 weeks. Call  Coumadin Clinic [phone] with any concerns,  new medications or if scheduled for any procedures

## 2019-06-15 ENCOUNTER — Other Ambulatory Visit: Payer: Self-pay

## 2019-06-15 ENCOUNTER — Ambulatory Visit (INDEPENDENT_AMBULATORY_CARE_PROVIDER_SITE_OTHER): Payer: Medicare Other | Admitting: *Deleted

## 2019-06-15 DIAGNOSIS — Z5181 Encounter for therapeutic drug level monitoring: Secondary | ICD-10-CM

## 2019-06-15 DIAGNOSIS — Z952 Presence of prosthetic heart valve: Secondary | ICD-10-CM | POA: Diagnosis not present

## 2019-06-15 DIAGNOSIS — Z953 Presence of xenogenic heart valve: Secondary | ICD-10-CM | POA: Diagnosis not present

## 2019-06-15 LAB — POCT INR: INR: 2.3 (ref 2.0–3.0)

## 2019-06-15 NOTE — Patient Instructions (Signed)
Description   Continue taking 1 tablet everyday except 1.5 tablets on Thursdays. Recheck INR in 3 weeks. Call  Coumadin Clinic [phone] with any concerns,  new medications or if scheduled for any procedures

## 2019-07-06 ENCOUNTER — Other Ambulatory Visit: Payer: Self-pay

## 2019-07-06 ENCOUNTER — Ambulatory Visit (INDEPENDENT_AMBULATORY_CARE_PROVIDER_SITE_OTHER): Payer: Medicare Other

## 2019-07-06 DIAGNOSIS — Z953 Presence of xenogenic heart valve: Secondary | ICD-10-CM | POA: Diagnosis not present

## 2019-07-06 DIAGNOSIS — Z952 Presence of prosthetic heart valve: Secondary | ICD-10-CM

## 2019-07-06 DIAGNOSIS — Z5181 Encounter for therapeutic drug level monitoring: Secondary | ICD-10-CM

## 2019-07-06 LAB — POCT INR: INR: 2.7 (ref 2.0–3.0)

## 2019-07-06 NOTE — Patient Instructions (Signed)
Description   Continue taking 1 tablet everyday except 1.5 tablets on Thursdays. Recheck INR in 4 weeks. Call  Coumadin Clinic [phone] with any concerns,  new medications or if scheduled for any procedures

## 2019-07-11 DIAGNOSIS — H353211 Exudative age-related macular degeneration, right eye, with active choroidal neovascularization: Secondary | ICD-10-CM | POA: Diagnosis not present

## 2019-07-11 DIAGNOSIS — H43811 Vitreous degeneration, right eye: Secondary | ICD-10-CM | POA: Diagnosis not present

## 2019-07-11 DIAGNOSIS — H35351 Cystoid macular degeneration, right eye: Secondary | ICD-10-CM | POA: Diagnosis not present

## 2019-07-11 DIAGNOSIS — H353223 Exudative age-related macular degeneration, left eye, with inactive scar: Secondary | ICD-10-CM | POA: Diagnosis not present

## 2019-07-11 DIAGNOSIS — H35051 Retinal neovascularization, unspecified, right eye: Secondary | ICD-10-CM | POA: Diagnosis not present

## 2019-08-03 ENCOUNTER — Other Ambulatory Visit: Payer: Self-pay

## 2019-08-03 ENCOUNTER — Ambulatory Visit (INDEPENDENT_AMBULATORY_CARE_PROVIDER_SITE_OTHER): Payer: Medicare Other | Admitting: *Deleted

## 2019-08-03 DIAGNOSIS — Z952 Presence of prosthetic heart valve: Secondary | ICD-10-CM | POA: Diagnosis not present

## 2019-08-03 DIAGNOSIS — Z953 Presence of xenogenic heart valve: Secondary | ICD-10-CM | POA: Diagnosis not present

## 2019-08-03 DIAGNOSIS — Z5181 Encounter for therapeutic drug level monitoring: Secondary | ICD-10-CM

## 2019-08-03 LAB — POCT INR: INR: 3.5 — AB (ref 2.0–3.0)

## 2019-08-03 NOTE — Patient Instructions (Signed)
Description   Hold today, then Continue taking 1 tablet everyday except 1.5 tablets on Thursdays. Recheck INR in 3 weeks. Call  Coumadin Clinic [phone] with any concerns,  new medications or if scheduled for any procedures

## 2019-08-09 DIAGNOSIS — Z23 Encounter for immunization: Secondary | ICD-10-CM | POA: Diagnosis not present

## 2019-08-22 DIAGNOSIS — Z85828 Personal history of other malignant neoplasm of skin: Secondary | ICD-10-CM | POA: Diagnosis not present

## 2019-08-22 DIAGNOSIS — D485 Neoplasm of uncertain behavior of skin: Secondary | ICD-10-CM | POA: Diagnosis not present

## 2019-08-22 DIAGNOSIS — L814 Other melanin hyperpigmentation: Secondary | ICD-10-CM | POA: Diagnosis not present

## 2019-08-22 DIAGNOSIS — D229 Melanocytic nevi, unspecified: Secondary | ICD-10-CM | POA: Diagnosis not present

## 2019-08-22 DIAGNOSIS — L819 Disorder of pigmentation, unspecified: Secondary | ICD-10-CM | POA: Diagnosis not present

## 2019-08-22 DIAGNOSIS — L570 Actinic keratosis: Secondary | ICD-10-CM | POA: Diagnosis not present

## 2019-08-22 DIAGNOSIS — D1801 Hemangioma of skin and subcutaneous tissue: Secondary | ICD-10-CM | POA: Diagnosis not present

## 2019-08-22 DIAGNOSIS — C44712 Basal cell carcinoma of skin of right lower limb, including hip: Secondary | ICD-10-CM | POA: Diagnosis not present

## 2019-08-22 DIAGNOSIS — L821 Other seborrheic keratosis: Secondary | ICD-10-CM | POA: Diagnosis not present

## 2019-08-23 DIAGNOSIS — H353211 Exudative age-related macular degeneration, right eye, with active choroidal neovascularization: Secondary | ICD-10-CM | POA: Diagnosis not present

## 2019-08-23 DIAGNOSIS — H353113 Nonexudative age-related macular degeneration, right eye, advanced atrophic without subfoveal involvement: Secondary | ICD-10-CM | POA: Diagnosis not present

## 2019-08-24 ENCOUNTER — Other Ambulatory Visit: Payer: Self-pay

## 2019-08-24 ENCOUNTER — Ambulatory Visit (INDEPENDENT_AMBULATORY_CARE_PROVIDER_SITE_OTHER): Payer: Medicare Other | Admitting: *Deleted

## 2019-08-24 DIAGNOSIS — Z5181 Encounter for therapeutic drug level monitoring: Secondary | ICD-10-CM | POA: Diagnosis not present

## 2019-08-24 DIAGNOSIS — Z952 Presence of prosthetic heart valve: Secondary | ICD-10-CM

## 2019-08-24 DIAGNOSIS — Z953 Presence of xenogenic heart valve: Secondary | ICD-10-CM

## 2019-08-24 LAB — POCT INR: INR: 2.5 (ref 2.0–3.0)

## 2019-08-24 NOTE — Patient Instructions (Signed)
Description   Continue taking 1 tablet everyday except 1.5 tablets on Thursdays. Recheck INR in 5 weeks. Call  Coumadin Clinic [phone] with any concerns,  new medications or if scheduled for any procedures

## 2019-09-18 ENCOUNTER — Encounter: Payer: Self-pay | Admitting: Interventional Cardiology

## 2019-09-18 ENCOUNTER — Encounter: Payer: Self-pay | Admitting: Physician Assistant

## 2019-09-18 ENCOUNTER — Telehealth: Payer: Self-pay | Admitting: *Deleted

## 2019-09-18 NOTE — Progress Notes (Signed)
Virtual Visit via Telephone Note   This visit type was conducted due to national recommendations for restrictions regarding the COVID-19 Pandemic (e.g. social distancing) in an effort to limit this patient's exposure and mitigate transmission in our community.  Due to his co-morbid illnesses, this patient is at least at moderate risk for complications without adequate follow up.  This format is felt to be most appropriate for this patient at this time.  The patient did not have access to video technology/had technical difficulties with video requiring transitioning to audio format only (telephone).  All issues noted in this document were discussed and addressed.  No physical exam could be performed with this format.  Please refer to the patient's chart for his  consent to telehealth for [facility]. Virtual platform was offered given ongoing worsening Covid-19 pandemic.  Date:  09/19/2019   ID:  Jason [name], DOB 03-06-1932, MRN [id number]  Patient Location: Home Provider Location: Home  PCP:  Shirline Frees, MD  Cardiologist:  Larae Grooms, MD  Electrophysiologist:  None   Evaluation Performed:  Follow-Up Visit  Chief Complaint:  Head pressure  History of Present Illness:    Jason [name] is a 85 y.o. male with valvular heart disease with remote mitral valve repair in 2005 followed by bioprosthetic aortic valve and mitral valve replacement in 08/2013, permanent atrial fibrillation, bifascicular block, mild CAD by 2014 cath (20% RCA), GERD, HTN, HLD, internal hemorrhoids, kidney stones, macular degeneration, vocal cord paralysis, mild CKD stage III. He was initially supposed to have a Maze procedure along with his valve surgery. Due to scar tissue from his initial surgery 9 years ago, he did not have a Maze done. He has been managed with warfarin and rate control. Last echo 12/2018 showed normal EF 55-60%, indeterminate diastolic parameters, normal RV function, severe LAE, stable  valvular function. No recent labs - 03/2018 Cr 1.32, normal CBC.   He is seen virtually today for 2 episodes of what he calls head pressure. The first episode occurred 7 months ago. He was sitting down, doing nothing at all, and suddenly felt pressure in his head. His ears began ringing and he felt dizzy and weak. The episode gradually improved over the course of 15 minutes and then he felt totally normal. No palpitations, aphasia, visual changes, facial asymmetry or paralysis. He didn't think much of it until he had another episode about a week ago. This time he was standing up getting ready to take a vitamin. He had to go and sit down. The episode was very similar in quality to the first. There was no specific provoking factor. He otherwise feels fine without any CP, SOB, edema, orthopnea or bleeding.  The patient does not have symptoms concerning for COVID-19 infection (fever, chills, cough, or new shortness of breath).    Past Medical History:  Diagnosis Date   AK (actinic keratosis)    Atrial fibrillation, persistent (HCC)    chronic coumadin therapy   Bifascicular block    Blindness    left eye   CKD (chronic kidney disease), stage III    Coronary artery disease    20% RCA by 2014 cath   Elevated PSA    GERD (gastroesophageal reflux disease)    H/O hiatal hernia    Heart murmur    History of kidney stones    Hydrocele    left   Hyperlipidemia    Hypertension    Internal hemorrhoids    Kidney stones  kidney stones   Macular degeneration    right eye   Mitral regurgitation 02/25/2011   Recurrent MR s/p mitral valve repair then bioprosthetic MVR 2014   OA (osteoarthritis) of neck    S/P aortic valve replacement with bioprosthetic valve 08/15/2013   23mm Edwards Magna Ease bovine pericardial tissue valve   S/P mitral valve repair 09/15/2004   Complex valvuloplasty including artificial Goretex neocord placement x4 and 31mm SARP ring annuloplasty via right  mini thoracotomy approach - Dr Evelina Dun @ [facility]   S/P redo mitral valve replacement and aortic valve replacement with bioprosthetic valves 08/15/2013   2mm Edwards [facility] Mitral bovine pericardial tissue valve   Severe aortic stenosis 11/14   tissue AVR    Vocal cord paralysis    Past Surgical History:  Procedure Laterality Date   AORTIC VALVE REPLACEMENT N/A 08/15/2013   Procedure: AORTIC VALVE REPLACEMENT (AVR);  Surgeon: Rexene Alberts, MD;  Location: [place];  Service: Open Heart Surgery;  Laterality: N/A;   ESOPHAGOGASTRODUODENOSCOPY N/A 08/02/2013   Procedure: ESOPHAGOGASTRODUODENOSCOPY (EGD);  Surgeon: Casandra Doffing, MD;  [facility] ENDOSCOPY;  Service: Cardiovascular;  Laterality: N/A;   EYE SURGERY Right    infection   GROIN MASS OPEN BIOPSY  2012   HEMORRHOID SURGERY     pt denies.   INGUINAL HERNIA REPAIR  08/2011   INTRAOPERATIVE TRANSESOPHAGEAL ECHOCARDIOGRAM N/A 08/15/2013   Procedure: INTRAOPERATIVE TRANSESOPHAGEAL ECHOCARDIOGRAM;  Surgeon: Rexene Alberts, MD;  Location: [place];  Service: Open Heart Surgery;  Laterality: N/A;   LEFT AND RIGHT HEART CATHETERIZATION WITH CORONARY ANGIOGRAM N/A 07/05/2013   Procedure: LEFT AND RIGHT HEART CATHETERIZATION WITH CORONARY ANGIOGRAM;  Surgeon: Jettie Booze, MD;  Location: [facility] CATH LAB;  Service: Cardiovascular;  Laterality: N/A;   MASS EXCISION Right 03/30/2018   Procedure: EXCISION RIGHT FLANK  MASS;  Surgeon: Coralie Keens, MD;  Location: [place];  Service: General;  Laterality: Right;   MITRAL VALVE REPAIR  09/16/2011   @ [facility], Dr Evelina Dun   MITRAL VALVE REPAIR N/A 08/15/2013   Procedure: REDO MITRAL VALVE (MV) REPLACEMENT;  Surgeon: Rexene Alberts, MD;  Location: [place];  Service: Open Heart Surgery;  Laterality: N/A;   TEE WITHOUT CARDIOVERSION N/A 07/05/2013   Procedure: TRANSESOPHAGEAL ECHOCARDIOGRAM (TEE);  Surgeon: Casandra Doffing, MD;  Location: [facility] ENDOSCOPY;  Service: Cardiovascular;  Laterality: N/A;   TEE  WITHOUT CARDIOVERSION N/A 08/02/2013   Procedure: TRANSESOPHAGEAL ECHOCARDIOGRAM (TEE);  Surgeon: Casandra Doffing, MD;  Location: [place];  Service: Cardiovascular;  Laterality: N/A;   UMBILICAL HERNIA REPAIR N/A 03/30/2018   Procedure: HERNIA REPAIR UMBILICAL;  Surgeon: Coralie Keens, MD;  Location: [place];  Service: General;  Laterality: N/A;     Current Meds  Medication Sig   acetaminophen (TYLENOL) 650 MG CR tablet Take 650 mg by mouth 2 (two) times daily.    ALPHAGAN P 0.1 % SOLN Place 1 drop 2 (two) times daily into both eyes.   Ascorbic Acid (VITAMIN C) 1000 MG tablet Take 1,000 mg by mouth daily.   atorvastatin (LIPITOR) 20 MG tablet Take 20 mg by mouth at bedtime.    b complex vitamins tablet Take 1 tablet by mouth daily.   CARTIA XT 240 MG 24 hr capsule Take 240 mg by mouth every evening.    clindamycin (CLEOCIN) 300 MG capsule Take 2 capsules (600mg ) by mouth once 30-60 minutes prior to abdominal surgery.   furosemide (LASIX) 40 MG tablet 40 mg in the am and 20 mg in  the pm   lisinopril (ZESTRIL) 10 MG tablet Take 10 mg by mouth daily.   metoprolol succinate (TOPROL-XL) 25 MG 24 hr tablet Take 12.5 mg by mouth 2 (two) times daily.   Multiple Vitamins-Minerals (PRESERVISION AREDS PO) Take 1 tablet by mouth 2 (two) times daily.    warfarin (COUMADIN) 5 MG tablet TAKE AS DIRECTED BY COUMADIN CLINIC (Patient taking differently: TAKE 1 TABLET (5 MG) BY MOUTH DAILY, EXCEPT TAKE 1.5 TABLETS (7.5 MG) BY MOUTH ON THURSDAYS.)   [DISCONTINUED] lisinopril (PRINIVIL,ZESTRIL) 20 MG tablet Take 10 mg by mouth every evening.      Allergies:   Penicillins   Social History   Tobacco Use   Smoking status: Former Smoker    Years: 8.00    Types: Cigarettes    Quit date: 10/12/1978    Years since quitting: 40.9   Smokeless tobacco: Never Used  Substance Use Topics   Alcohol use: No   Drug use: No     Family Hx: The patient's family history includes Cancer in his  father; Diabetes in his father and mother; Heart attack in his mother; Hyperlipidemia in his father and mother; Hypertension in his brother, father, and mother; Stroke in his father and mother.  ROS:   Please see the history of present illness.    All other systems reviewed and are negative.   Prior CV studies:    Most recent pertinent cardiac studies are outlined above.  Labs/Other Tests and Data Reviewed:    EKG:  An ECG dated 12/14/18 was personally reviewed today and demonstrated:  atrial fibrillation 82bpm, RBBB + LAFB with nonspecific STT changes  Recent Labs: No results found for requested labs within last 8760 hours.   Recent Lipid Panel No results found for: CHOL, TRIG, HDL, CHOLHDL, LDLCALC, LDLDIRECT  Wt Readings from Last 3 Encounters:  09/19/19 163 lb (73.9 kg)  12/14/18 173 lb 1.9 oz (78.5 kg)  03/30/18 175 lb 12.8 oz (79.7 kg)     Objective:    Vital Signs:  BP (!) 147/78    Pulse 81    Ht 5\' 8"  (1.727 m)    Wt 163 lb (73.9 kg)    BMI 24.78 kg/m    VS reviewed. General - calm M in no acute distress Pulm - No labored breathing, no coughing during visit, no audible wheezing, speaking in full sentences Neuro - A+Ox3, no slurred speech, answers questions appropriately Psych - Pleasant affect     ASSESSMENT & PLAN:    1. Dizzness/head pressure - episodes are somewhat concerning for arrhythmia. Given baseline bifascicular block, this could represent bradycardia. However, his chart also carries history of NSVT in the problem list. Will have him come in for labs at his next convenience (BMET, CBC, Mg, TSH) and obtain 30 day live monitor. If he does not have any recurrent episodes for the duration of wearing the monitor, I would consider referring to EP to consider ILR.  2. Permanent atrial fib - continue warfarin. I would not make any medicine changes while we are evaluating the above. Get f/u CBC. 3. Essential HTN - initial BP was elevated prior to the start of the  visit, but he rechecked it and it was 131/75. No changes made today. I would not aggressively titrate BP meds at this time given #1. 4. Bifascicular block - plan to evaluate for bradyarrhythmia on monitor. 5. Valvular heart disease - recent echocardiogram 12/2018 was stable. The first episode happened about 2 months after that,  although he is not having any other symptoms that would suggest acute valvular issue. SBE ppx reviewed. We can revisit repeat echocardiogram if initial workup is unrevealing.  COVID-19 Education: The signs and symptoms of COVID-19 were discussed with the patient and how to seek care for testing (follow up with PCP or arrange E-visit).  The importance of social distancing was discussed today.  Time:   Today, I have spent 18 minutes with the patient with telehealth technology discussing the above problems. Additional 7 minutes spent reviewing prior records.   Medication Adjustments/Labs and Tests Ordered: Current medicines are reviewed at length with the patient today.  Concerns regarding medicines are outlined above.   Follow Up: in the office for evaluation after monitor completion with me. I did discuss going ahead and getting his lipids when he comes in for his labs but he states this is followed by his PCP and he would prefer to have them monitor in January.  Signed, Charlie Pitter, PA-C  09/19/2019 11:40 AM    [facility]

## 2019-09-18 NOTE — Telephone Encounter (Signed)

## 2019-09-18 NOTE — Telephone Encounter (Signed)
error 

## 2019-09-19 ENCOUNTER — Telehealth: Payer: Self-pay

## 2019-09-19 ENCOUNTER — Telehealth (INDEPENDENT_AMBULATORY_CARE_PROVIDER_SITE_OTHER): Payer: Medicare Other | Admitting: Physician Assistant

## 2019-09-19 ENCOUNTER — Telehealth: Payer: Self-pay | Admitting: Radiology

## 2019-09-19 ENCOUNTER — Encounter: Payer: Self-pay | Admitting: Physician Assistant

## 2019-09-19 ENCOUNTER — Other Ambulatory Visit: Payer: Self-pay

## 2019-09-19 VITALS — BP 131/75 | HR 81 | Ht 68.0 in | Wt 163.0 lb

## 2019-09-19 DIAGNOSIS — I10 Essential (primary) hypertension: Secondary | ICD-10-CM

## 2019-09-19 DIAGNOSIS — I38 Endocarditis, valve unspecified: Secondary | ICD-10-CM

## 2019-09-19 DIAGNOSIS — L905 Scar conditions and fibrosis of skin: Secondary | ICD-10-CM | POA: Diagnosis not present

## 2019-09-19 DIAGNOSIS — I452 Bifascicular block: Secondary | ICD-10-CM

## 2019-09-19 DIAGNOSIS — I4821 Permanent atrial fibrillation: Secondary | ICD-10-CM

## 2019-09-19 DIAGNOSIS — C44712 Basal cell carcinoma of skin of right lower limb, including hip: Secondary | ICD-10-CM | POA: Diagnosis not present

## 2019-09-19 DIAGNOSIS — R42 Dizziness and giddiness: Secondary | ICD-10-CM

## 2019-09-19 DIAGNOSIS — R519 Headache, unspecified: Secondary | ICD-10-CM

## 2019-09-19 NOTE — Patient Instructions (Addendum)
Medication Instructions:  Your physician recommends that you continue on your current medications as directed. Please refer to the Current Medication list given to you today.  *If you need a refill on your cardiac medications before your next appointment, please call your pharmacy*  Lab Work: None ordered  If you have labs (blood work) drawn today and your tests are completely normal, you will receive your results only by: Marland Kitchen MyChart Message (if you have MyChart) OR . A paper copy in the mail If you have any lab test that is abnormal or we need to change your treatment, we will call you to review the results.  Testing/Procedures: Your physician has recommended that you wear an event monitor. Event monitors are medical devices that record the heart's electrical activity. Doctors most often [place] these monitors to diagnose arrhythmias. Arrhythmias are problems with the speed or rhythm of the heartbeat. The monitor is a small, portable device. You can wear one while you do your normal daily activities. This is usually used to diagnose what is causing palpitations/syncope (passing out).  SOMEONE WILL CALL YOU TO ARRANGE THIS.   Follow-Up: At [facility], you and your health needs are our priority.  As part of our continuing mission to provide you with exceptional heart care, we have created designated Provider Care Teams.  These Care Teams include your primary Cardiologist (physician) and Advanced Practice Providers (APPs -  Physician Assistants and Nurse Practitioners) who all work together to provide you with the care you need, when you need it.  Your next appointment:   You will be scheduled for a follow-up appointment once you have received your monitor.  PLEASE CALL AND LET us KNOW WHEN YOU GET IT SO WE CAN SCHEDULE THIS.  You can send a mychart message as well.  Other Instructions I also want to remind you about SBE prophylaxis given his heart valve. We did not specifically address today but  I like all patients to be reminded at their visits just in case.   Endocarditis Information  You may be at risk for developing endocarditis since you have an artificial heart valve or a repaired heart valve. Endocarditis is an infection of the lining of the heart or heart valves. Certain surgical and dental procedures may put you at risk, such as teeth cleaning or other dental procedures or any surgery involving the respiratory, urinary, gastrointestinal tract, gallbladder or prostate.    Notify your doctor or dentist before having any invasive procedures. You will need to take antibiotics before certain procedures. To prevent endocarditis, maintain good oral health. Seek prompt medical attention for any mouth/gum, skin or urinary tract infections

## 2019-09-19 NOTE — Telephone Encounter (Signed)
Enrolled patient for a 30 [employer] event monitor to be mailed. Brief insturctions were gone over with the patient and he knows to expect the monitor to arrive in 3-4 days.

## 2019-09-19 NOTE — Telephone Encounter (Signed)

## 2019-09-20 ENCOUNTER — Other Ambulatory Visit: Payer: Medicare Other | Admitting: *Deleted

## 2019-09-20 ENCOUNTER — Other Ambulatory Visit: Payer: Self-pay

## 2019-09-20 DIAGNOSIS — R531 Weakness: Secondary | ICD-10-CM | POA: Diagnosis not present

## 2019-09-20 DIAGNOSIS — R42 Dizziness and giddiness: Secondary | ICD-10-CM | POA: Diagnosis not present

## 2019-09-20 DIAGNOSIS — R519 Headache, unspecified: Secondary | ICD-10-CM | POA: Diagnosis not present

## 2019-09-20 DIAGNOSIS — I10 Essential (primary) hypertension: Secondary | ICD-10-CM | POA: Diagnosis not present

## 2019-09-21 LAB — BASIC METABOLIC PANEL
BUN/Creatinine Ratio: 15 (ref 10–24)
BUN: 18 mg/dL (ref 8–27)
CO2: 26 mmol/L (ref 20–29)
Calcium: 9.6 mg/dL (ref 8.6–10.2)
Chloride: 103 mmol/L (ref 96–106)
Creatinine, Ser: 1.22 mg/dL (ref 0.76–1.27)
GFR calc Af Amer: 62 mL/min/{1.73_m2} (ref >59)
GFR calc non Af Amer: 54 mL/min/{1.73_m2} — ABNORMAL LOW (ref >59)
Glucose: 77 mg/dL (ref 65–99)
Potassium: 4.2 mmol/L (ref 3.5–5.2)
Sodium: 144 mmol/L (ref 134–144)

## 2019-09-21 LAB — CBC
Hematocrit: 40.7 % (ref 37.5–51.0)
Hemoglobin: 13.6 g/dL (ref 13.0–17.7)
MCH: 30.2 pg (ref 26.6–33.0)
MCHC: 33.4 g/dL (ref 31.5–35.7)
MCV: 90 fL (ref 79–97)
Platelets: 177 10*3/uL (ref 150–450)
RBC: 4.51 x10E6/uL (ref 4.14–5.80)
RDW: 12.6 % (ref 11.6–15.4)
WBC: 6.8 10*3/uL (ref 3.4–10.8)

## 2019-09-21 LAB — TSH: TSH: 2.55 u[IU]/mL (ref 0.450–4.500)

## 2019-09-21 LAB — MAGNESIUM: Magnesium: 2.1 mg/dL (ref 1.6–2.3)

## 2019-09-24 ENCOUNTER — Telehealth: Payer: Self-pay | Admitting: Cardiology

## 2019-09-24 ENCOUNTER — Encounter (INDEPENDENT_AMBULATORY_CARE_PROVIDER_SITE_OTHER): Payer: Medicare Other

## 2019-09-24 ENCOUNTER — Encounter: Payer: Self-pay | Admitting: Interventional Cardiology

## 2019-09-24 DIAGNOSIS — R42 Dizziness and giddiness: Secondary | ICD-10-CM

## 2019-09-24 NOTE — Telephone Encounter (Signed)
I was called by the monitoring company, [employer], to report that the patient's initial baseline rhythm is atrial flutter with variable conduction, 90 bpm.  Patient has known permanent atrial fibrillation and is anticoagulated with warfarin. Monitor has been placed for 30 days for complaints of dizziness, has a history of NSVT.

## 2019-09-28 ENCOUNTER — Other Ambulatory Visit: Payer: Self-pay

## 2019-09-28 ENCOUNTER — Ambulatory Visit (INDEPENDENT_AMBULATORY_CARE_PROVIDER_SITE_OTHER): Payer: Medicare Other | Admitting: *Deleted

## 2019-09-28 DIAGNOSIS — Z953 Presence of xenogenic heart valve: Secondary | ICD-10-CM | POA: Diagnosis not present

## 2019-09-28 DIAGNOSIS — Z952 Presence of prosthetic heart valve: Secondary | ICD-10-CM

## 2019-09-28 DIAGNOSIS — Z5181 Encounter for therapeutic drug level monitoring: Secondary | ICD-10-CM | POA: Diagnosis not present

## 2019-09-28 DIAGNOSIS — I4821 Permanent atrial fibrillation: Secondary | ICD-10-CM

## 2019-09-28 LAB — POCT INR: INR: 2.6 (ref 2.0–3.0)

## 2019-09-28 NOTE — Patient Instructions (Signed)
Description   Continue taking 1 tablet everyday except 1.5 tablets on Thursdays. Recheck INR in 6 weeks. Call  Coumadin Clinic [phone] with any concerns,  new medications or if scheduled for any procedures

## 2019-11-01 ENCOUNTER — Ambulatory Visit: Payer: Medicare Other | Attending: Internal Medicine

## 2019-11-01 DIAGNOSIS — Z23 Encounter for immunization: Secondary | ICD-10-CM | POA: Diagnosis not present

## 2019-11-01 NOTE — Progress Notes (Signed)
   Covid-19 Vaccination Clinic  Name:  Jason [name]    MRN: [id number] DOB: Dec 04, 1931  11/01/2019  [name] was observed post Covid-19 immunization for 15 minutes without incidence. He was provided with Vaccine Information Sheet and instruction to access the V-Safe system.   [name] was instructed to call 911 with any severe reactions post vaccine: Marland Kitchen Difficulty breathing  . Swelling of your face and throat  . A fast heartbeat  . A bad rash all over your body  . Dizziness and weakness    Immunizations Administered    Name Date Dose VIS Date Route   Pfizer COVID-19 Vaccine 11/01/2019 12:12 PM 0.3 mL 09/22/2019 Intramuscular   Manufacturer: [place]   Lot: [id number]   [place]: [id number]

## 2019-11-09 ENCOUNTER — Other Ambulatory Visit: Payer: Self-pay

## 2019-11-09 ENCOUNTER — Ambulatory Visit (INDEPENDENT_AMBULATORY_CARE_PROVIDER_SITE_OTHER): Payer: Medicare Other | Admitting: *Deleted

## 2019-11-09 DIAGNOSIS — Z5181 Encounter for therapeutic drug level monitoring: Secondary | ICD-10-CM | POA: Diagnosis not present

## 2019-11-09 DIAGNOSIS — Z953 Presence of xenogenic heart valve: Secondary | ICD-10-CM

## 2019-11-09 DIAGNOSIS — Z952 Presence of prosthetic heart valve: Secondary | ICD-10-CM | POA: Diagnosis not present

## 2019-11-09 LAB — POCT INR: INR: 2.4 (ref 2.0–3.0)

## 2019-11-09 NOTE — Patient Instructions (Signed)
Description   Continue taking 1 tablet everyday except 1.5 tablets on Thursdays. Recheck INR in 6 weeks. Call  Coumadin Clinic [phone] with any concerns,  new medications or if scheduled for any procedures

## 2019-11-13 ENCOUNTER — Telehealth: Payer: Self-pay | Admitting: *Deleted

## 2019-11-13 DIAGNOSIS — H353211 Exudative age-related macular degeneration, right eye, with active choroidal neovascularization: Secondary | ICD-10-CM | POA: Diagnosis not present

## 2019-11-13 DIAGNOSIS — I495 Sick sinus syndrome: Secondary | ICD-10-CM

## 2019-11-13 DIAGNOSIS — H353223 Exudative age-related macular degeneration, left eye, with inactive scar: Secondary | ICD-10-CM | POA: Diagnosis not present

## 2019-11-13 DIAGNOSIS — H35351 Cystoid macular degeneration, right eye: Secondary | ICD-10-CM | POA: Diagnosis not present

## 2019-11-13 DIAGNOSIS — H43811 Vitreous degeneration, right eye: Secondary | ICD-10-CM | POA: Diagnosis not present

## 2019-11-13 NOTE — Telephone Encounter (Signed)
-----   Message from Jason [name], [place] sent at 11/13/2019  8:34 AM EST ----- Please find out how patient is feeling. His monitor shows espideos of fast HR as well as slow HR. This could help explain why he had the episode of dizziness he had. Please refer to EP for further evaluation of tachy-brady syndrome. Thanks!

## 2019-11-14 DIAGNOSIS — N400 Enlarged prostate without lower urinary tract symptoms: Secondary | ICD-10-CM | POA: Diagnosis not present

## 2019-11-15 DIAGNOSIS — R3121 Asymptomatic microscopic hematuria: Secondary | ICD-10-CM | POA: Diagnosis not present

## 2019-11-15 DIAGNOSIS — N433 Hydrocele, unspecified: Secondary | ICD-10-CM | POA: Diagnosis not present

## 2019-11-15 DIAGNOSIS — N400 Enlarged prostate without lower urinary tract symptoms: Secondary | ICD-10-CM | POA: Diagnosis not present

## 2019-11-16 NOTE — Progress Notes (Signed)
Cardiology Office Note   Date:  11/17/2019   ID:  Jason [name], DOB 04-28-1932, MRN [id number]  PCP:  Shirline Frees, MD    No chief complaint on file.  Status post mitral valve replacement/aortic valve replacement  Wt Readings from Last 3 Encounters:  11/17/19 173 lb 12.8 oz (78.8 kg)  09/19/19 163 lb (73.9 kg)  12/14/18 173 lb 1.9 oz (78.5 kg)       History of Present Illness: Jason [name] is a 86 y.o. male  who has had valvular heart disease and atrial fibrillation. He underwent aortic valve and mitral valve replacement in 11/14. He was initially supposed to have a Maze procedure along with his valve surgery. Due to scar tissue from his initial surgery 9 years ago, he did not have a maze done. He has been rate controlledI the past, and anticoagulated.   He was told his kidney function was abnormalin 2016.   He has intentionally lost weightin 2016through portion control and exercise.Hehasmaintain this weight loss.  He had a lipoma removed and a hernia repair in 2019.  No cardiac issues.    Since the last visit, he has done well.  He tolerated the first dose of his COVID vaccine.  He also got the flu shot.   Denies : Chest pain. Dizziness. Leg edema. Nitroglycerin use. Orthopnea. Palpitations. Paroxysmal nocturnal dyspnea. Shortness of breath. Syncope.   Easy bruising.  Walking some for exercise. Has not been golfing because of [place].   He has had some ringing in the ears, and headache; 2 episodes.  Monitor was prescribed to detect arrhythmia in those situations.   Appt with Dr. Lovena Le in a few weeks.    Past Medical History:  Diagnosis Date  . AK (actinic keratosis)   . Atrial fibrillation, persistent (HCC)    chronic coumadin therapy  . Bifascicular block   . Blindness    left eye  . CKD (chronic kidney disease), stage III   . Coronary artery disease    20% RCA by 2014 cath  . Elevated PSA   . GERD (gastroesophageal reflux disease)   . H/O  hiatal hernia   . Heart murmur   . History of kidney stones   . Hydrocele    left  . Hyperlipidemia   . Hypertension   . Internal hemorrhoids   . Kidney stones    kidney stones  . Macular degeneration    right eye  . Mitral regurgitation 02/25/2011   Recurrent MR s/p mitral valve repair then bioprosthetic MVR 2014  . OA (osteoarthritis) of neck   . S/P aortic valve replacement with bioprosthetic valve 08/15/2013   2mm Edwards Magna Ease bovine pericardial tissue valve  . S/P mitral valve repair 09/15/2004   Complex valvuloplasty including artificial Goretex neocord placement x4 and 51mm SARP ring annuloplasty via right mini thoracotomy approach - Dr Evelina Dun @ [facility]  . S/P redo mitral valve replacement and aortic valve replacement with bioprosthetic valves 08/15/2013   47mm Edwards Magna Mitral bovine pericardial tissue valve  . Severe aortic stenosis 11/14   tissue AVR   . Vocal cord paralysis     Past Surgical History:  Procedure Laterality Date  . AORTIC VALVE REPLACEMENT N/A 08/15/2013   Procedure: AORTIC VALVE REPLACEMENT (AVR);  Surgeon: Rexene Alberts, MD;  Location: [place];  Service: Open Heart Surgery;  Laterality: N/A;  . ESOPHAGOGASTRODUODENOSCOPY N/A 08/02/2013   Procedure: ESOPHAGOGASTRODUODENOSCOPY (EGD);  Surgeon: Casandra Doffing, MD;  Location: [facility]  ENDOSCOPY;  Service: Cardiovascular;  Laterality: N/A;  . EYE SURGERY Right    infection  . GROIN MASS OPEN BIOPSY  2012  . HEMORRHOID SURGERY     pt denies.  . INGUINAL HERNIA REPAIR  08/2011  . INTRAOPERATIVE TRANSESOPHAGEAL ECHOCARDIOGRAM N/A 08/15/2013   Procedure: INTRAOPERATIVE TRANSESOPHAGEAL ECHOCARDIOGRAM;  Surgeon: Rexene Alberts, MD;  Location: [place];  Service: Open Heart Surgery;  Laterality: N/A;  . LEFT AND RIGHT HEART CATHETERIZATION WITH CORONARY ANGIOGRAM N/A 07/05/2013   Procedure: LEFT AND RIGHT HEART CATHETERIZATION WITH CORONARY ANGIOGRAM;  Surgeon: Jettie Booze, MD;  Location: [facility] CATH LAB;   Service: Cardiovascular;  Laterality: N/A;  . MASS EXCISION Right 03/30/2018   Procedure: EXCISION RIGHT FLANK  MASS;  Surgeon: Coralie Keens, MD;  Location: [place];  Service: General;  Laterality: Right;  . MITRAL VALVE REPAIR  09/16/2011   @ [facility], Dr Evelina Dun  . MITRAL VALVE REPAIR N/A 08/15/2013   Procedure: REDO MITRAL VALVE (MV) REPLACEMENT;  Surgeon: Rexene Alberts, MD;  Location: [place];  Service: Open Heart Surgery;  Laterality: N/A;  . TEE WITHOUT CARDIOVERSION N/A 07/05/2013   Procedure: TRANSESOPHAGEAL ECHOCARDIOGRAM (TEE);  Surgeon: Casandra Doffing, MD;  Location: [facility] ENDOSCOPY;  Service: Cardiovascular;  Laterality: N/A;  . TEE WITHOUT CARDIOVERSION N/A 08/02/2013   Procedure: TRANSESOPHAGEAL ECHOCARDIOGRAM (TEE);  Surgeon: Casandra Doffing, MD;  Location: [place];  Service: Cardiovascular;  Laterality: N/A;  . UMBILICAL HERNIA REPAIR N/A 03/30/2018   Procedure: HERNIA REPAIR UMBILICAL;  Surgeon: Coralie Keens, MD;  Location: [place];  Service: General;  Laterality: N/A;     Current Outpatient Medications  Medication Sig Dispense Refill  . acetaminophen (TYLENOL) 650 MG CR tablet Take 650 mg by mouth 2 (two) times daily.     . ALPHAGAN P 0.1 % SOLN Place 1 drop 2 (two) times daily into both eyes.  3  . Ascorbic Acid (VITAMIN C) 1000 MG tablet Take 1,000 mg by mouth daily.    Marland Kitchen atorvastatin (LIPITOR) 20 MG tablet Take 20 mg by mouth at bedtime.     Marland Kitchen b complex vitamins tablet Take 1 tablet by mouth daily.    Marland Kitchen CARTIA XT 240 MG 24 hr capsule Take 240 mg by mouth every evening.     . clindamycin (CLEOCIN) 300 MG capsule Take 2 capsules (600mg ) by mouth once 30-60 minutes prior to abdominal surgery. 2 capsule 1  . furosemide (LASIX) 40 MG tablet 40 mg in the am and 20 mg in the pm 30 tablet   . lisinopril (ZESTRIL) 10 MG tablet Take 10 mg by mouth daily.    . metoprolol succinate (TOPROL-XL) 25 MG 24 hr tablet Take 12.5 mg by mouth 2 (two) times daily.  0  . Multiple Vitamins-Minerals  (PRESERVISION AREDS PO) Take 1 tablet by mouth 2 (two) times daily.     Marland Kitchen warfarin (COUMADIN) 5 MG tablet TAKE AS DIRECTED BY COUMADIN CLINIC (Patient taking differently: TAKE 1 TABLET (5 MG) BY MOUTH DAILY, EXCEPT TAKE 1.5 TABLETS (7.5 MG) BY MOUTH ON THURSDAYS.) 120 tablet 1   No current facility-administered medications for this visit.    Allergies:   Penicillins    Social History:  The patient  reports that he quit smoking about 41 years ago. His smoking use included cigarettes. He quit after 8.00 years of use. He has never used smokeless tobacco. He reports that he does not drink alcohol or use drugs.   Family History:  The patient's family history includes  Cancer in his father; Diabetes in his father and mother; Heart attack in his mother; Hyperlipidemia in his father and mother; Hypertension in his brother, father, and mother; Stroke in his father and mother.    ROS:  Please see the history of present illness.   Otherwise, review of systems are positive for stable weight at home.   All other systems are reviewed and negative.    PHYSICAL EXAM: VS:  BP 140/64   Pulse 78   Ht 5\' 8"  (1.727 m)   Wt 173 lb 12.8 oz (78.8 kg)   SpO2 91%   BMI 26.43 kg/m  , BMI Body mass index is 26.43 kg/m. GEN: Well nourished, well developed, in no acute distress  HEENT: normal  Neck: no JVD, carotid bruits, or masses Cardiac: irregularly irregular; 1/6 early systolic murmur, no rubs, or gallops,no edema  Respiratory:  clear to auscultation bilaterally, normal work of breathing GI: soft, nontender, nondistended, + BS MS: no deformity or atrophy  Skin: warm and dry, no rash Neuro:  Strength and sensation are intact Psych: euthymic mood, full affect   EKG:   The ekg ordered today demonstrates AFib, RBBB, no ST changes   Recent Labs: 09/20/2019: BUN 18; Creatinine, Ser 1.22; Hemoglobin 13.6; Magnesium 2.1; Platelets 177; Potassium 4.2; Sodium 144; TSH 2.550   Lipid Panel No results found  for: CHOL, TRIG, HDL, CHOLHDL, VLDL, LDLCALC, LDLDIRECT   Other studies Reviewed: Additional studies/ records that were reviewed today with results demonstrating: labs from 09/2019.   ASSESSMENT AND PLAN:  1. Atrial fibrillation: Rate controlled.  Continue Cartia. 2. Anticoagulated: Coumadin for stroke prevention.  Had some microscopic blood in urine.  Followed by urology. 3. Mitral regurgitation: Continue SBE prophylaxis.  No CHF.  4.   Aortic stenosis: Bioprosthetic valve in place. 5.   CKD: Avoid nephrotoxins. 6.  Hyperlipidemia: Continue atorvastatin.  Will need repeat lipids at some point in the future.  Will check on 2/17.   Current medicines are reviewed at length with the patient today.  The patient concerns regarding his medicines were addressed.  The following changes have been made:  No change  Labs/ tests ordered today include: lipids No orders of the defined types were placed in this encounter.   Recommend 150 minutes/week of aerobic exercise Low fat, low carb, high fiber diet recommended  Disposition:   FU in 1 year   Signed, Larae Grooms, MD  11/17/2019 2:18 PM    [facility] [place], [place],   [address] Phone: [phone]; Fax: [phone]

## 2019-11-17 ENCOUNTER — Encounter: Payer: Self-pay | Admitting: Interventional Cardiology

## 2019-11-17 ENCOUNTER — Other Ambulatory Visit: Payer: Self-pay

## 2019-11-17 ENCOUNTER — Ambulatory Visit (INDEPENDENT_AMBULATORY_CARE_PROVIDER_SITE_OTHER): Payer: Medicare Other | Admitting: Interventional Cardiology

## 2019-11-17 VITALS — BP 140/64 | HR 78 | Ht 68.0 in | Wt 173.8 lb

## 2019-11-17 DIAGNOSIS — I10 Essential (primary) hypertension: Secondary | ICD-10-CM

## 2019-11-17 DIAGNOSIS — N1831 Chronic kidney disease, stage 3a: Secondary | ICD-10-CM | POA: Diagnosis not present

## 2019-11-17 DIAGNOSIS — I4821 Permanent atrial fibrillation: Secondary | ICD-10-CM | POA: Diagnosis not present

## 2019-11-17 DIAGNOSIS — Z952 Presence of prosthetic heart valve: Secondary | ICD-10-CM

## 2019-11-17 MED ORDER — METOPROLOL SUCCINATE ER 25 MG PO TB24: 12.5000 mg | ORAL_TABLET | Freq: Two times a day (BID) | ORAL | 3 refills | Status: DC

## 2019-11-17 NOTE — Patient Instructions (Signed)
Medication Instructions:  Your physician recommends that you continue on your current medications as directed. Please refer to the Current Medication list given to you today.  *If you need a refill on your cardiac medications before your next appointment, please call your pharmacy*  Lab Work: Lipid and liver when you come back for your appointment with Dr. Lovena Le. If you have labs (blood work) drawn today and your tests are completely normal, you will receive your results only by: Marland Kitchen MyChart Message (if you have MyChart) OR . A paper copy in the mail If you have any lab test that is abnormal or we need to change your treatment, we will call you to review the results.  Testing/Procedures: None today  Follow-Up: At [facility], you and your health needs are our priority.  As part of our continuing mission to provide you with exceptional heart care, we have created designated Provider Care Teams.  These Care Teams include your primary Cardiologist (physician) and Advanced Practice Providers (APPs -  Physician Assistants and Nurse Practitioners) who all work together to provide you with the care you need, when you need it.  Your next appointment:   1 year(s)  The format for your next appointment:   In Person  Provider:   You may see Larae Grooms, MD or one of the following Advanced Practice Providers on your designated Care Team:    Melina Copa, PA-C  Ermalinda Barrios, PA-C

## 2019-11-22 ENCOUNTER — Ambulatory Visit: Payer: Medicare Other | Attending: Internal Medicine

## 2019-11-22 DIAGNOSIS — Z23 Encounter for immunization: Secondary | ICD-10-CM | POA: Insufficient documentation

## 2019-11-22 NOTE — Progress Notes (Signed)
   Covid-19 Vaccination Clinic  Name:  Jason [name]    MRN: [id number] DOB: 08/08/1932  11/22/2019  [name] was observed post Covid-19 immunization for 15 minutes without incidence. He was provided with Vaccine Information Sheet and instruction to access the V-Safe system.   [name] was instructed to call 911 with any severe reactions post vaccine: Marland Kitchen Difficulty breathing  . Swelling of your face and throat  . A fast heartbeat  . A bad rash all over your body  . Dizziness and weakness    Immunizations Administered    Name Date Dose VIS Date Route   Pfizer COVID-19 Vaccine 11/22/2019  5:39 PM 0.3 mL 09/22/2019 Intramuscular   Manufacturer: [place]   Lot: [id number]   [place]: [id number]

## 2019-11-29 ENCOUNTER — Encounter: Payer: Self-pay | Admitting: Internal Medicine

## 2019-11-29 ENCOUNTER — Other Ambulatory Visit: Payer: Self-pay

## 2019-11-29 ENCOUNTER — Ambulatory Visit (INDEPENDENT_AMBULATORY_CARE_PROVIDER_SITE_OTHER): Payer: Medicare Other | Admitting: Internal Medicine

## 2019-11-29 DIAGNOSIS — N1831 Chronic kidney disease, stage 3a: Secondary | ICD-10-CM | POA: Diagnosis not present

## 2019-11-29 DIAGNOSIS — I495 Sick sinus syndrome: Secondary | ICD-10-CM | POA: Insufficient documentation

## 2019-11-29 DIAGNOSIS — I2510 Atherosclerotic heart disease of native coronary artery without angina pectoris: Secondary | ICD-10-CM | POA: Diagnosis not present

## 2019-11-29 DIAGNOSIS — R42 Dizziness and giddiness: Secondary | ICD-10-CM | POA: Insufficient documentation

## 2019-11-29 LAB — LIPID PANEL
Chol/HDL Ratio: 2.8 ratio (ref 0.0–5.0)
Cholesterol, Total: 145 mg/dL (ref 100–199)
HDL: 51 mg/dL (ref >39)
LDL Chol Calc (NIH): 73 mg/dL (ref 0–99)
Triglycerides: 116 mg/dL (ref 0–149)
VLDL Cholesterol Cal: 21 mg/dL (ref 5–40)

## 2019-11-29 LAB — HEPATIC FUNCTION PANEL
ALT: 15 IU/L (ref 0–44)
AST: 23 IU/L (ref 0–40)
Albumin: 4.4 g/dL (ref 3.6–4.6)
Alkaline Phosphatase: 103 IU/L (ref 39–117)
Bilirubin Total: 0.5 mg/dL (ref 0.0–1.2)
Bilirubin, Direct: 0.18 mg/dL (ref 0.00–0.40)
Total Protein: 6.6 g/dL (ref 6.0–8.5)

## 2019-11-29 MED ORDER — DILTIAZEM HCL ER COATED BEADS 120 MG PO CP24: 120.0000 mg | ORAL_CAPSULE | Freq: Every day | ORAL | 3 refills | Status: DC

## 2019-11-29 NOTE — Progress Notes (Signed)
HPI Mr. Jason [name] is referred today by Melina Copa to consider PPM insertion. He has a h/o aortic and mitral valve disease, s/p AVR/MVR, persistent atrial fib, and HTN. He wore a cardiac monitor for 30 days which demonstrated both nocturnal and daytime pauses of up to 4 seconds. He is fairly sedentary but denies any symptoms associated with these pauses whatsoever. He has not had syncope. No edema. His LV function is normal. He has asymptomatic NSVT. Allergies  Allergen Reactions  . Penicillins Rash and Other (See Comments)    Has patient had a PCN reaction causing immediate rash, facial/tongue/throat swelling, SOB or lightheadedness with hypotension: # # Yes # # Has patient had a PCN reaction causing severe rash involving mucus membranes or skin necrosis: No Has patient had a PCN reaction that required hospitalization: No Has patient had a PCN reaction occurring within the last 10 years: No If all of the above answers are "NO", then may proceed with Cephalosporin use.      Current Outpatient Medications  Medication Sig Dispense Refill  . acetaminophen (TYLENOL) 650 MG CR tablet Take 650 mg by mouth 2 (two) times daily.     . ALPHAGAN P 0.1 % SOLN Place 1 drop 2 (two) times daily into both eyes.  3  . Ascorbic Acid (VITAMIN C) 1000 MG tablet Take 1,000 mg by mouth daily.    Marland Kitchen atorvastatin (LIPITOR) 20 MG tablet Take 20 mg by mouth at bedtime.     Marland Kitchen b complex vitamins tablet Take 1 tablet by mouth daily.    Marland Kitchen CARTIA XT 240 MG 24 hr capsule Take 240 mg by mouth every evening.     . clindamycin (CLEOCIN) 300 MG capsule Take 2 capsules (600mg ) by mouth once 30-60 minutes prior to abdominal surgery. 2 capsule 1  . furosemide (LASIX) 40 MG tablet 40 mg in the am and 20 mg in the pm 30 tablet   . lisinopril (ZESTRIL) 10 MG tablet Take 10 mg by mouth daily.    . metoprolol succinate (TOPROL-XL) 25 MG 24 hr tablet Take 0.5 tablets (12.5 mg total) by mouth 2 (two) times daily. 90 tablet 3  .  Multiple Vitamins-Minerals (PRESERVISION AREDS PO) Take 1 tablet by mouth 2 (two) times daily.     Marland Kitchen warfarin (COUMADIN) 5 MG tablet TAKE AS DIRECTED BY COUMADIN CLINIC 120 tablet 1   No current facility-administered medications for this visit.     Past Medical History:  Diagnosis Date  . AK (actinic keratosis)   . Atrial fibrillation, persistent (HCC)    chronic coumadin therapy  . Bifascicular block   . Blindness    left eye  . CKD (chronic kidney disease), stage III   . Coronary artery disease    20% RCA by 2014 cath  . Elevated PSA   . GERD (gastroesophageal reflux disease)   . H/O hiatal hernia   . Heart murmur   . History of kidney stones   . Hydrocele    left  . Hyperlipidemia   . Hypertension   . Internal hemorrhoids   . Kidney stones    kidney stones  . Macular degeneration    right eye  . Mitral regurgitation 02/25/2011   Recurrent MR s/p mitral valve repair then bioprosthetic MVR 2014  . OA (osteoarthritis) of neck   . S/P aortic valve replacement with bioprosthetic valve 08/15/2013   75mm Edwards Magna Ease bovine pericardial tissue valve  . S/P mitral valve repair  09/15/2004   Complex valvuloplasty including artificial Goretex neocord placement x4 and 73mm SARP ring annuloplasty via right mini thoracotomy approach - Dr Evelina Dun @ [facility]  . S/P redo mitral valve replacement and aortic valve replacement with bioprosthetic valves 08/15/2013   54mm Edwards Magna Mitral bovine pericardial tissue valve  . Severe aortic stenosis 11/14   tissue AVR   . Vocal cord paralysis     ROS:   All systems reviewed and negative except as noted in the HPI.   Past Surgical History:  Procedure Laterality Date  . AORTIC VALVE REPLACEMENT N/A 08/15/2013   Procedure: AORTIC VALVE REPLACEMENT (AVR);  Surgeon: Rexene Alberts, MD;  Location: [place];  Service: Open Heart Surgery;  Laterality: N/A;  . ESOPHAGOGASTRODUODENOSCOPY N/A 08/02/2013   Procedure: ESOPHAGOGASTRODUODENOSCOPY  (EGD);  Surgeon: Casandra Doffing, MD;  Location: [facility] ENDOSCOPY;  Service: Cardiovascular;  Laterality: N/A;  . EYE SURGERY Right    infection  . GROIN MASS OPEN BIOPSY  2012  . HEMORRHOID SURGERY     pt denies.  . INGUINAL HERNIA REPAIR  08/2011  . INTRAOPERATIVE TRANSESOPHAGEAL ECHOCARDIOGRAM N/A 08/15/2013   Procedure: INTRAOPERATIVE TRANSESOPHAGEAL ECHOCARDIOGRAM;  Surgeon: Rexene Alberts, MD;  Location: [place];  Service: Open Heart Surgery;  Laterality: N/A;  . LEFT AND RIGHT HEART CATHETERIZATION WITH CORONARY ANGIOGRAM N/A 07/05/2013   Procedure: LEFT AND RIGHT HEART CATHETERIZATION WITH CORONARY ANGIOGRAM;  Surgeon: Jettie Booze, MD;  Location: [facility] CATH LAB;  Service: Cardiovascular;  Laterality: N/A;  . MASS EXCISION Right 03/30/2018   Procedure: EXCISION RIGHT FLANK  MASS;  Surgeon: Coralie Keens, MD;  Location: [place];  Service: General;  Laterality: Right;  . MITRAL VALVE REPAIR  09/16/2011   @ [facility], Dr Evelina Dun  . MITRAL VALVE REPAIR N/A 08/15/2013   Procedure: REDO MITRAL VALVE (MV) REPLACEMENT;  Surgeon: Rexene Alberts, MD;  Location: [place];  Service: Open Heart Surgery;  Laterality: N/A;  . TEE WITHOUT CARDIOVERSION N/A 07/05/2013   Procedure: TRANSESOPHAGEAL ECHOCARDIOGRAM (TEE);  Surgeon: Casandra Doffing, MD;  Location[facility] ENDOSCOPY;  Service: Cardiovascular;  Laterality: N/A;  . TEE WITHOUT CARDIOVERSION N/A 08/02/2013   Procedure: TRANSESOPHAGEAL ECHOCARDIOGRAM (TEE);  Surgeon: Casandra Doffing, MD;  Location: [place];  Service: Cardiovascular;  Laterality: N/A;  . UMBILICAL HERNIA REPAIR N/A 03/30/2018   Procedure: HERNIA REPAIR UMBILICAL;  Surgeon: Coralie Keens, MD;  Location: [facility] OR;  Service: General;  Laterality: N/A;     Family History  Problem Relation Age of Onset  . Cancer Father        colon  . Hypertension Father   . Diabetes Father   . Hyperlipidemia Father   . Stroke Father   . Hypertension Mother   . Diabetes Mother   . Hyperlipidemia Mother   .  Stroke Mother   . Heart attack Mother   . Hypertension Brother        MI, S/P MVR     Social History   Socioeconomic History  . Marital status: Married    Spouse name: Not on file  . Number of children: 0  . Years of education: Not on file  . Highest education level: Not on file  Occupational History  . Occupation: retired  Tobacco Use  . Smoking status: Former Smoker    Years: 8.00    Types: Cigarettes    Quit date: 10/12/1978    Years since quitting: 41.1  . Smokeless tobacco: Never Used  Substance and Sexual Activity  . Alcohol use:  No  . Drug use: No  . Sexual activity: Not on file  Other Topics Concern  . Not on file  Social History Narrative  . Not on file   Social Determinants of Health   Financial Resource Strain:   . Difficulty of Paying Living Expenses: Not on file  Food Insecurity:   . Worried About Charity fundraiser in the Last Year: Not on file  . Ran Out of Food in the Last Year: Not on file  Transportation Needs:   . Lack of Transportation (Medical): Not on file  . Lack of Transportation (Non-Medical): Not on file  Physical Activity:   . Days of Exercise per Week: Not on file  . Minutes of Exercise per Session: Not on file  Stress:   . Feeling of Stress : Not on file  Social Connections:   . Frequency of Communication with Friends and Family: Not on file  . Frequency of Social Gatherings with Friends and Family: Not on file  . Attends Religious Services: Not on file  . Active Member of Clubs or Organizations: Not on file  . Attends Archivist Meetings: Not on file  . Marital Status: Not on file  Intimate Partner Violence:   . Fear of Current or Ex-Partner: Not on file  . Emotionally Abused: Not on file  . Physically Abused: Not on file  . Sexually Abused: Not on file     BP (!) 156/72   Pulse 71   Ht 5\' 8"  (1.727 m)   Wt 174 lb (78.9 kg)   SpO2 93%   BMI 26.46 kg/m   Physical Exam:  Well appearing NAD HEENT:  Unremarkable Neck:  No JVD, no thyromegally Lymphatics:  No adenopathy Back:  No CVA tenderness Lungs:  Clear with no wheezes HEART:  IRegular rate rhythm, no murmurs, no rubs, no clicks Abd:  soft, positive bowel sounds, no organomegally, no rebound, no guarding Ext:  2 plus pulses, no edema, no cyanosis, no clubbing Skin:  No rashes no nodules Neuro:  CN II through XII intact, motor grossly intact  DEVICE  Normal device function.  See PaceArt for details.   Assess/Plan: 1. Atrial fib - his rates are slow. Minimal RVR. I have asked him to reduce his dose of cardizem. If he develops RVR, then he will need a ppm. 2. Pauses - he has not had symptoms. I have asked him to reduce his dose of cardizem. 3. HTN - he may require additional medical therapy especially now that we have reduced his cardizem dose.   Mikle Bosworth.D.

## 2019-11-29 NOTE — Patient Instructions (Addendum)
Medication Instructions:  Your physician has recommended you make the following change in your medication:   1.  Reduce your diltiazem---Take diltiazem ER 120 mg--one capsule by mouth daily.   Labwork: You will get lab work today:  Fasting LIPIDS and LFT's (ordered by Dr. Irish Lack on 11/17/19)  Testing/Procedures: None ordered.  Follow-Up: Your physician wants you to follow-up in: as needed with Dr. Lovena Le.    Any Other Special Instructions Will Be Listed Below (If Applicable).  If you need a refill on your cardiac medications before your next appointment, please call your pharmacy.

## 2019-12-01 DIAGNOSIS — I10 Essential (primary) hypertension: Secondary | ICD-10-CM | POA: Diagnosis not present

## 2019-12-01 DIAGNOSIS — I4891 Unspecified atrial fibrillation: Secondary | ICD-10-CM | POA: Diagnosis not present

## 2019-12-01 DIAGNOSIS — E782 Mixed hyperlipidemia: Secondary | ICD-10-CM | POA: Diagnosis not present

## 2019-12-19 DIAGNOSIS — M1990 Unspecified osteoarthritis, unspecified site: Secondary | ICD-10-CM | POA: Diagnosis not present

## 2019-12-19 DIAGNOSIS — H353211 Exudative age-related macular degeneration, right eye, with active choroidal neovascularization: Secondary | ICD-10-CM | POA: Diagnosis not present

## 2019-12-19 DIAGNOSIS — H401131 Primary open-angle glaucoma, bilateral, mild stage: Secondary | ICD-10-CM | POA: Diagnosis not present

## 2019-12-19 DIAGNOSIS — H35351 Cystoid macular degeneration, right eye: Secondary | ICD-10-CM | POA: Diagnosis not present

## 2019-12-19 DIAGNOSIS — I4891 Unspecified atrial fibrillation: Secondary | ICD-10-CM | POA: Diagnosis not present

## 2019-12-19 DIAGNOSIS — E782 Mixed hyperlipidemia: Secondary | ICD-10-CM | POA: Diagnosis not present

## 2019-12-19 DIAGNOSIS — I10 Essential (primary) hypertension: Secondary | ICD-10-CM | POA: Diagnosis not present

## 2019-12-21 ENCOUNTER — Other Ambulatory Visit: Payer: Self-pay

## 2019-12-21 ENCOUNTER — Ambulatory Visit (INDEPENDENT_AMBULATORY_CARE_PROVIDER_SITE_OTHER): Payer: Medicare Other

## 2019-12-21 DIAGNOSIS — Z5181 Encounter for therapeutic drug level monitoring: Secondary | ICD-10-CM | POA: Diagnosis not present

## 2019-12-21 DIAGNOSIS — Z952 Presence of prosthetic heart valve: Secondary | ICD-10-CM

## 2019-12-21 DIAGNOSIS — I4821 Permanent atrial fibrillation: Secondary | ICD-10-CM | POA: Diagnosis not present

## 2019-12-21 DIAGNOSIS — Z953 Presence of xenogenic heart valve: Secondary | ICD-10-CM

## 2019-12-21 LAB — POCT INR: INR: 2.5 (ref 2.0–3.0)

## 2019-12-21 NOTE — Patient Instructions (Signed)
Description   Continue taking 1 tablet everyday except 1.5 tablets on Thursdays. Recheck INR in 6 weeks. Call  Coumadin Clinic [phone] with any concerns,  new medications or if scheduled for any procedures

## 2020-01-17 DIAGNOSIS — R350 Frequency of micturition: Secondary | ICD-10-CM | POA: Diagnosis not present

## 2020-01-24 ENCOUNTER — Ambulatory Visit (INDEPENDENT_AMBULATORY_CARE_PROVIDER_SITE_OTHER): Payer: Medicare Other | Admitting: Ophthalmology

## 2020-01-24 ENCOUNTER — Other Ambulatory Visit: Payer: Self-pay

## 2020-01-24 ENCOUNTER — Encounter (INDEPENDENT_AMBULATORY_CARE_PROVIDER_SITE_OTHER): Payer: Self-pay | Admitting: Ophthalmology

## 2020-01-24 DIAGNOSIS — H4051X0 Glaucoma secondary to other eye disorders, right eye, stage unspecified: Secondary | ICD-10-CM | POA: Insufficient documentation

## 2020-01-24 DIAGNOSIS — H353223 Exudative age-related macular degeneration, left eye, with inactive scar: Secondary | ICD-10-CM | POA: Diagnosis not present

## 2020-01-24 DIAGNOSIS — H4051X3 Glaucoma secondary to other eye disorders, right eye, severe stage: Secondary | ICD-10-CM

## 2020-01-24 DIAGNOSIS — H35351 Cystoid macular degeneration, right eye: Secondary | ICD-10-CM

## 2020-01-24 DIAGNOSIS — H353211 Exudative age-related macular degeneration, right eye, with active choroidal neovascularization: Secondary | ICD-10-CM | POA: Diagnosis not present

## 2020-01-24 MED ORDER — RANIBIZUMAB 0.5 MG/0.05ML IZ SOLN FOR KALEIDOSCOPE
0.5000 mg | INTRAVITREAL | Status: AC | PRN
  Administered 2020-01-24: 13:00:00 .5 mg via INTRAVITREAL

## 2020-01-24 NOTE — Progress Notes (Signed)
01/24/2020     CHIEF COMPLAINT Patient presents for Macular Degeneration and Retina Follow Up   HISTORY OF PRESENT ILLNESS: Jason [name] is a 86 y.o. male who presents to the clinic today for:   HPI    Retina Follow Up    Patient presents with  Wet AMD.  In right eye.  Severity is moderate.  Since onset it is stable.  I, the attending physician,  performed the HPI with the patient and updated documentation appropriately.          Comments    5 Week AMD f\u OD. 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 OCT  Pt states vision is stable. Denies FOL and floaters.       Last edited by Hurman Horn, MD on 01/24/2020  1:20 PM. (History)      Referring physician: Shirline Frees, MD [place],  [place] [id number]  HISTORICAL INFORMATION:   Selected notes from the MEDICAL RECORD NUMBER    Lab Results  Component Value Date   HGBA1C 5.4 08/11/2013     CURRENT MEDICATIONS: Current Outpatient Medications (Ophthalmic Drugs)  Medication Sig  . ALPHAGAN P 0.1 % SOLN Place 1 drop 2 (two) times daily into both eyes.   No current facility-administered medications for this visit. (Ophthalmic Drugs)   Current Outpatient Medications (Other)  Medication Sig  . acetaminophen (TYLENOL) 650 MG CR tablet Take 650 mg by mouth 2 (two) times daily.   . Ascorbic Acid (VITAMIN C) 1000 MG tablet Take 1,000 mg by mouth daily.  Marland Kitchen atorvastatin (LIPITOR) 20 MG tablet Take 20 mg by mouth at bedtime.   Marland Kitchen b complex vitamins tablet Take 1 tablet by mouth daily.  . clindamycin (CLEOCIN) 300 MG capsule Take 2 capsules (600mg ) by mouth once 30-60 minutes prior to abdominal surgery.  Marland Kitchen diltiazem (CARDIZEM CD) 120 MG 24 hr capsule Take 1 capsule (120 mg total) by mouth daily.  . furosemide (LASIX) 40 MG tablet 40 mg in the am and 20 mg in the pm  . lisinopril (ZESTRIL) 10 MG tablet Take 10 mg by mouth daily.  . metoprolol succinate (TOPROL-XL) 25 MG 24 hr tablet Take 0.5 tablets (12.5 mg total) by  mouth 2 (two) times daily.  . Multiple Vitamins-Minerals (PRESERVISION AREDS PO) Take 1 tablet by mouth 2 (two) times daily.   Marland Kitchen warfarin (COUMADIN) 5 MG tablet TAKE AS DIRECTED BY COUMADIN CLINIC  . ciprofloxacin (CIPRO) 500 MG tablet Take 500 mg by mouth 2 (two) times daily.  . phenazopyridine (PYRIDIUM) 200 MG tablet Take 200 mg by mouth every 8 (eight) hours as needed.   No current facility-administered medications for this visit. (Other)      REVIEW OF SYSTEMS:    ALLERGIES Allergies  Allergen Reactions  . Penicillins Rash and Other (See Comments)    Has patient had a PCN reaction causing immediate rash, facial/tongue/throat swelling, SOB or lightheadedness with hypotension: # # Yes # # Has patient had a PCN reaction causing severe rash involving mucus membranes or skin necrosis: No Has patient had a PCN reaction that required hospitalization: No Has patient had a PCN reaction occurring within the last 10 years: No If all of the above answers are "NO", then may proceed with Cephalosporin use.     PAST MEDICAL HISTORY Past Medical History:  Diagnosis Date  . AK (actinic keratosis)   . Atrial fibrillation, persistent (HCC)    chronic coumadin therapy  . Bifascicular block   .  Blindness    left eye  . CKD (chronic kidney disease), stage III   . Coronary artery disease    20% RCA by 2014 cath  . Elevated PSA   . GERD (gastroesophageal reflux disease)   . H/O hiatal hernia   . Heart murmur   . History of kidney stones   . Hydrocele    left  . Hyperlipidemia   . Hypertension   . Internal hemorrhoids   . Kidney stones    kidney stones  . Macular degeneration    right eye  . Mitral regurgitation 02/25/2011   Recurrent MR s/p mitral valve repair then bioprosthetic MVR 2014  . OA (osteoarthritis) of neck   . S/P aortic valve replacement with bioprosthetic valve 08/15/2013   32mm Edwards Magna Ease bovine pericardial tissue valve  . S/P mitral valve repair 09/15/2004    Complex valvuloplasty including artificial Goretex neocord placement x4 and 27mm SARP ring annuloplasty via right mini thoracotomy approach - Dr Evelina Dun @ [facility]  . S/P redo mitral valve replacement and aortic valve replacement with bioprosthetic valves 08/15/2013   62mm Edwards Magna Mitral bovine pericardial tissue valve  . Severe aortic stenosis 11/14   tissue AVR   . Vocal cord paralysis    Past Surgical History:  Procedure Laterality Date  . AORTIC VALVE REPLACEMENT N/A 08/15/2013   Procedure: AORTIC VALVE REPLACEMENT (AVR);  Surgeon: Rexene Alberts, MD;  Location: [place];  Service: Open Heart Surgery;  Laterality: N/A;  . CATARACT EXTRACTION Bilateral 2011   Dr. Bing Plume  . ESOPHAGOGASTRODUODENOSCOPY N/A 08/02/2013   Procedure: ESOPHAGOGASTRODUODENOSCOPY (EGD);  Surgeon: Casandra Doffing, MD;  Location: [facility] ENDOSCOPY;  Service: Cardiovascular;  Laterality: N/A;  . EYE SURGERY Right    infection  . GROIN MASS OPEN BIOPSY  2012  . HEMORRHOID SURGERY     pt denies.  . INGUINAL HERNIA REPAIR  08/2011  . INTRAOPERATIVE TRANSESOPHAGEAL ECHOCARDIOGRAM N/A 08/15/2013   Procedure: INTRAOPERATIVE TRANSESOPHAGEAL ECHOCARDIOGRAM;  Surgeon: Rexene Alberts, MD;  Location: [place];  Service: Open Heart Surgery;  Laterality: N/A;  . LEFT AND RIGHT HEART CATHETERIZATION WITH CORONARY ANGIOGRAM N/A 07/05/2013   Procedure: LEFT AND RIGHT HEART CATHETERIZATION WITH CORONARY ANGIOGRAM;  Surgeon: Jettie Booze, MD;  Location: [facility] CATH LAB;  Service: Cardiovascular;  Laterality: N/A;  . MASS EXCISION Right 03/30/2018   Procedure: EXCISION RIGHT FLANK  MASS;  Surgeon: Coralie Keens, MD;  Location: [place];  Service: General;  Laterality: Right;  . MITRAL VALVE REPAIR  09/16/2011   @ [facility], Dr Evelina Dun  . MITRAL VALVE REPAIR N/A 08/15/2013   Procedure: REDO MITRAL VALVE (MV) REPLACEMENT;  Surgeon: Rexene Alberts, MD;  Location: [place];  Service: Open Heart Surgery;  Laterality: N/A;  . TEE WITHOUT CARDIOVERSION N/A  07/05/2013   Procedure: TRANSESOPHAGEAL ECHOCARDIOGRAM (TEE);  Surgeon: Casandra Doffing, MD;  Location: [facility] ENDOSCOPY;  Service: Cardiovascular;  Laterality: N/A;  . TEE WITHOUT CARDIOVERSION N/A 08/02/2013   Procedure: TRANSESOPHAGEAL ECHOCARDIOGRAM (TEE);  Surgeon: Casandra Doffing, MD;  Location: [place];  Service: Cardiovascular;  Laterality: N/A;  . UMBILICAL HERNIA REPAIR N/A 03/30/2018   Procedure: HERNIA REPAIR UMBILICAL;  Surgeon: Coralie Keens, MD;  Location: [facility] OR;  Service: General;  Laterality: N/A;    FAMILY HISTORY Family History  Problem Relation Age of Onset  . Cancer Father        colon  . Hypertension Father   . Diabetes Father   . Hyperlipidemia Father   . Stroke Father   .  Hypertension Mother   . Diabetes Mother   . Hyperlipidemia Mother   . Stroke Mother   . Heart attack Mother   . Hypertension Brother        MI, S/P MVR    SOCIAL HISTORY Social History   Tobacco Use  . Smoking status: Former Smoker    Years: 8.00    Types: Cigarettes    Quit date: 10/12/1978    Years since quitting: 41.3  . Smokeless tobacco: Never Used  Substance Use Topics  . Alcohol use: No  . Drug use: No         OPHTHALMIC EXAM:  Base Eye Exam    Visual Acuity (Snellen - Linear)      Right Left   Dist cc 20/25 20/400   Dist ph cc  NI   Correction: Glasses       Tonometry (Tonopen, 11:44 AM)      Right Left   Pressure 19 20       Pupils      Pupils Dark Light Shape React APD   Right PERRL 3 2 Round Slow None   Left PERRL 3 2 Round Sluggish +1       Visual Fields (Counting fingers)      Left Right    Full Full       Neuro/Psych    Oriented x3: Yes   Mood/Affect: Normal       Dilation    Right eye: 1.0% Mydriacyl, 2.5% Phenylephrine @ 11:44 AM        Slit Lamp and Fundus Exam    External Exam      Right Left   External Normal Normal       Slit Lamp Exam      Right Left   Lids/Lashes Normal Normal   Conjunctiva/Sclera White and quiet White  and quiet   Cornea Clear Clear   Anterior Chamber Deep and quiet Deep and quiet   Iris Round and reactive Round and reactive   Lens Posterior chamber intraocular lens Posterior chamber intraocular lens   Anterior Vitreous Normal Normal       Fundus Exam      Right Left   Posterior Vitreous Posterior vitreous detachment Normal   Disc Normal    C/D Ratio 0.7    Macula Advanced age related macular degeneration, Atrophy, Retinal pigment epithelial atrophy, Disciform scar, [place], Mottling, Retinal pigment epithelial mottling    Vessels Normal    Periphery Normal           IMAGING AND PROCEDURES  Imaging and Procedures for @TODAY @  OCT, Retina - OU - Both Eyes       Right Eye Quality was good. Scan locations included subfoveal. Central Foveal Thickness: 342. Progression has been stable. Findings include abnormal foveal contour, disciform scar, central retinal atrophy, outer retinal atrophy, choroidal neovascular membrane.   Left Eye Quality was good. Scan locations included subfoveal.   Notes OD with chronic cystoid macular edema superotemporal to the foveal avascular zone which has been stable now for over a year.  This chronically active CN VM  Requires ongoing suppressive therapy with loose symptoms.  This condition is resistant to Avastin and Eylea in the past       Intravitreal Injection, Pharmacologic Agent - OD - Right Eye       Time Out 01/24/2020. 1:25 PM. Confirmed correct patient, procedure, site, and patient consented.   Anesthesia Topical anesthesia was used. Anesthetic medications included Akten 3.5%.  Procedure Preparation included Ofloxacin , 5% betadine to ocular surface, 10% betadine to eyelids. A 30 gauge needle was used.   Injection:  0.5 mg ranibizumab (LUCENTIS) 0.5 MG/0.05ML intravitreal injection   NDC: 50242-080-01, Lot: [id number]   Route: Intravitreal, Site: Right Eye, Waste: 0 mg  Post-op Post injection exam found visual acuity of at  least counting fingers. The patient tolerated the procedure well. There were no complications. The patient received written and verbal post procedure care education. Post injection medications were not given.                 ASSESSMENT/PLAN:  Exudative age-related macular degeneration of right eye with active choroidal neovascularization (HCC) The nature of wet macular degeneration was discussed with the patient.  Forms of therapy reviewed include the use of Anti-VEGF medications injected painlessly into the eye, as well as other possible treatment modalities, including thermal laser therapy. Fellow eye involvement and risks were discussed with the patient. Upon the finding of wet age related macular degeneration, treatment will be offered. The treatment regimen is on a treat as needed basis with the intent to treat if necessary and extend interval of exams when possible. On average 1 out of 6 patients do not need lifetime therapy. However, the risk of recurrent disease is high for a lifetime.  Initially monthly, then periodic, examinations and evaluations will determine whether the next treatment is required on the day of the examination. OD with chronic cystoid macular edema superotemporal to the foveal avascular zone which has been stable now for over a year.  This chronically active CN VM  Requires ongoing suppressive therapy with loose symptoms.  This condition is resistant to Avastin and Eylea in the past       ICD-10-CM   1. Exudative age-related macular degeneration of right eye with active choroidal neovascularization (HCC)  H35.3211 OCT, Retina - OU - Both Eyes    Intravitreal Injection, Pharmacologic Agent - OD - Right Eye    ranibizumab (LUCENTIS) 0.5 MG/0.05ML intravitreal injection 0.5 mg  2. Exudative age-related macular degeneration of left eye with inactive scar (HCC)  H35.3223 OCT, Retina - OU - Both Eyes  3. Cystoid macular edema of right eye  H35.351   4. Neovascular  glaucoma, right eye, severe stage  H40.51X3     1.OD with chronic cystoid macular edema superotemporal to the foveal avascular zone which has been stable now for over a year.  This chronically active CN VM  Requires ongoing suppressive therapy with loose symptoms.  This condition is resistant to Avastin and Eylea in the past  2.  OD for Lucentis intravitreal injection today for wet AMD  3.  Ophthalmic Meds Ordered this visit:  Meds ordered this encounter  Medications  . ranibizumab (LUCENTIS) 0.5 MG/0.05ML intravitreal injection 0.5 mg       Return in about 5 weeks (around 02/28/2020) for LUCENTIS OCT, OD.  There are no Patient Instructions on file for this visit.   Explained the diagnoses, plan, and follow up with the patient and they expressed understanding.  Patient expressed understanding of the importance of proper follow up care.   Clent Demark Joycelynn Fritsche M.D. Diseases & Surgery of the Retina and Vitreous Retina & Diabetic Intercourse @TODAY @     Abbreviations: M myopia (nearsighted); A astigmatism; H hyperopia (farsighted); P presbyopia; Mrx spectacle prescription;  CTL contact lenses; OD right eye; OS left eye; OU both eyes  XT exotropia; ET esotropia; PEK punctate epithelial keratitis; PEE punctate epithelial  erosions; DES dry eye syndrome; MGD meibomian gland dysfunction; ATs artificial tears; PFAT's preservative free artificial tears; [place] nuclear sclerotic cataract; PSC posterior subcapsular cataract; ERM epi-retinal membrane; PVD posterior vitreous detachment; RD retinal detachment; DM diabetes mellitus; DR diabetic retinopathy; NPDR non-proliferative diabetic retinopathy; PDR proliferative diabetic retinopathy; CSME clinically significant macular edema; DME diabetic macular edema; dbh dot blot hemorrhages; CWS cotton wool spot; POAG primary open angle glaucoma; C/D cup-to-disc ratio; HVF humphrey visual field; GVF goldmann visual field; OCT optical coherence tomography; IOP  intraocular pressure; BRVO Branch retinal vein occlusion; CRVO central retinal vein occlusion; CRAO central retinal artery occlusion; BRAO branch retinal artery occlusion; RT retinal tear; SB scleral buckle; PPV pars plana vitrectomy; VH Vitreous hemorrhage; PRP panretinal laser photocoagulation; IVK intravitreal kenalog; VMT vitreomacular traction; MH Macular hole;  NVD neovascularization of the disc; NVE neovascularization elsewhere; AREDS age related eye disease study; ARMD age related macular degeneration; POAG primary open angle glaucoma; EBMD epithelial/anterior basement membrane dystrophy; ACIOL anterior chamber intraocular lens; IOL intraocular lens; PCIOL posterior chamber intraocular lens; Phaco/IOL phacoemulsification with intraocular lens placement; [place] photorefractive keratectomy; LASIK laser assisted in situ keratomileusis; HTN hypertension; DM diabetes mellitus; COPD chronic obstructive pulmonary disease

## 2020-01-24 NOTE — Assessment & Plan Note (Signed)
The nature of wet macular degeneration was discussed with the patient.  Forms of therapy reviewed include the use of Anti-VEGF medications injected painlessly into the eye, as well as other possible treatment modalities, including thermal laser therapy. Fellow eye involvement and risks were discussed with the patient. Upon the finding of wet age related macular degeneration, treatment will be offered. The treatment regimen is on a treat as needed basis with the intent to treat if necessary and extend interval of exams when possible. On average 1 out of 6 patients do not need lifetime therapy. However, the risk of recurrent disease is high for a lifetime.  Initially monthly, then periodic, examinations and evaluations will determine whether the next treatment is required on the day of the examination. OD with chronic cystoid macular edema superotemporal to the foveal avascular zone which has been stable now for over a year.  This chronically active CN VM  Requires ongoing suppressive therapy with loose symptoms.  This condition is resistant to Avastin and Eylea in the past

## 2020-02-01 ENCOUNTER — Ambulatory Visit (INDEPENDENT_AMBULATORY_CARE_PROVIDER_SITE_OTHER): Payer: Medicare Other | Admitting: *Deleted

## 2020-02-01 ENCOUNTER — Other Ambulatory Visit: Payer: Self-pay

## 2020-02-01 DIAGNOSIS — Z5181 Encounter for therapeutic drug level monitoring: Secondary | ICD-10-CM

## 2020-02-01 DIAGNOSIS — Z953 Presence of xenogenic heart valve: Secondary | ICD-10-CM

## 2020-02-01 DIAGNOSIS — Z952 Presence of prosthetic heart valve: Secondary | ICD-10-CM

## 2020-02-01 LAB — POCT INR: INR: 2 (ref 2.0–3.0)

## 2020-02-01 NOTE — Patient Instructions (Signed)
Description   Continue taking 1 tablet everyday except 1.5 tablets on Thursdays. Recheck INR in 6 weeks. Call  Coumadin Clinic [phone] with any concerns,  new medications or if scheduled for any procedures

## 2020-02-28 ENCOUNTER — Other Ambulatory Visit: Payer: Self-pay

## 2020-02-28 ENCOUNTER — Encounter (INDEPENDENT_AMBULATORY_CARE_PROVIDER_SITE_OTHER): Payer: Self-pay | Admitting: Ophthalmology

## 2020-02-28 ENCOUNTER — Ambulatory Visit (INDEPENDENT_AMBULATORY_CARE_PROVIDER_SITE_OTHER): Payer: Medicare Other | Admitting: Ophthalmology

## 2020-02-28 DIAGNOSIS — H353211 Exudative age-related macular degeneration, right eye, with active choroidal neovascularization: Secondary | ICD-10-CM

## 2020-02-28 DIAGNOSIS — H353223 Exudative age-related macular degeneration, left eye, with inactive scar: Secondary | ICD-10-CM | POA: Diagnosis not present

## 2020-03-04 DIAGNOSIS — H353211 Exudative age-related macular degeneration, right eye, with active choroidal neovascularization: Secondary | ICD-10-CM | POA: Diagnosis not present

## 2020-03-04 MED ORDER — RANIBIZUMAB 0.5 MG/0.05ML IZ SOLN FOR KALEIDOSCOPE
0.5000 mg | INTRAVITREAL | Status: AC | PRN
  Administered 2020-03-04: .5 mg via INTRAVITREAL

## 2020-03-04 NOTE — Progress Notes (Signed)
02/28/2020     CHIEF COMPLAINT Patient presents for Retina Follow Up   HISTORY OF PRESENT ILLNESS: Jason [name] is a 86 y.o. male who presents to the clinic today for:   HPI    Retina Follow Up    Patient presents with  Wet AMD.  In right eye.  Severity is moderate.  Duration of 5 weeks.  Since onset it is stable.  I, the attending physician,  performed the HPI with the patient and updated documentation appropriately.          Comments    5 Week AMD f\u OD. 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 OCT  Pt states no changes in vision. Denies any complaints.       Last edited by Tilda Franco on 02/28/2020 10:31 AM. (History)      Referring physician: Shirline Frees, MD [address],  [place] [address]  HISTORICAL INFORMATION:   Selected notes from the MEDICAL RECORD NUMBER    Lab Results  Component Value Date   HGBA1C 5.4 08/11/2013     CURRENT MEDICATIONS: Current Outpatient Medications (Ophthalmic Drugs)  Medication Sig  . ALPHAGAN P 0.1 % SOLN Place 1 drop 2 (two) times daily into both eyes.   No current facility-administered medications for this visit. (Ophthalmic Drugs)   Current Outpatient Medications (Other)  Medication Sig  . acetaminophen (TYLENOL) 650 MG CR tablet Take 650 mg by mouth 2 (two) times daily.   . Ascorbic Acid (VITAMIN C) 1000 MG tablet Take 1,000 mg by mouth daily.  Marland Kitchen atorvastatin (LIPITOR) 20 MG tablet Take 20 mg by mouth at bedtime.   Marland Kitchen b complex vitamins tablet Take 1 tablet by mouth daily.  . ciprofloxacin (CIPRO) 500 MG tablet Take 500 mg by mouth 2 (two) times daily.  . clindamycin (CLEOCIN) 300 MG capsule Take 2 capsules (600mg ) by mouth once 30-60 minutes prior to abdominal surgery.  Marland Kitchen diltiazem (CARDIZEM CD) 120 MG 24 hr capsule Take 1 capsule (120 mg total) by mouth daily.  . furosemide (LASIX) 40 MG tablet 40 mg in the am and 20 mg in the pm  . lisinopril (ZESTRIL) 10 MG tablet Take 10 mg by mouth daily.  .  metoprolol succinate (TOPROL-XL) 25 MG 24 hr tablet Take 0.5 tablets (12.5 mg total) by mouth 2 (two) times daily.  . Multiple Vitamins-Minerals (PRESERVISION AREDS PO) Take 1 tablet by mouth 2 (two) times daily.   . phenazopyridine (PYRIDIUM) 200 MG tablet Take 200 mg by mouth every 8 (eight) hours as needed.  . warfarin (COUMADIN) 5 MG tablet TAKE AS DIRECTED BY COUMADIN CLINIC   No current facility-administered medications for this visit. (Other)      REVIEW OF SYSTEMS:    ALLERGIES Allergies  Allergen Reactions  . Penicillins Rash and Other (See Comments)    Has patient had a PCN reaction causing immediate rash, facial/tongue/throat swelling, SOB or lightheadedness with hypotension: # # Yes # # Has patient had a PCN reaction causing severe rash involving mucus membranes or skin necrosis: No Has patient had a PCN reaction that required hospitalization: No Has patient had a PCN reaction occurring within the last 10 years: No If all of the above answers are "NO", then may proceed with Cephalosporin use.     PAST MEDICAL HISTORY Past Medical History:  Diagnosis Date  . AK (actinic keratosis)   . Atrial fibrillation, persistent (HCC)    chronic coumadin therapy  . Bifascicular block   .  Blindness    left eye  . CKD (chronic kidney disease), stage III   . Coronary artery disease    20% RCA by 2014 cath  . Elevated PSA   . GERD (gastroesophageal reflux disease)   . H/O hiatal hernia   . Heart murmur   . History of kidney stones   . Hydrocele    left  . Hyperlipidemia   . Hypertension   . Internal hemorrhoids   . Kidney stones    kidney stones  . Macular degeneration    right eye  . Mitral regurgitation 02/25/2011   Recurrent MR s/p mitral valve repair then bioprosthetic MVR 2014  . OA (osteoarthritis) of neck   . S/P aortic valve replacement with bioprosthetic valve 08/15/2013   56mm Edwards Magna Ease bovine pericardial tissue valve  . S/P mitral valve repair  09/15/2004   Complex valvuloplasty including artificial Goretex neocord placement x4 and 56mm SARP ring annuloplasty via right mini thoracotomy approach - Dr Evelina Dun @ [facility]  . S/P redo mitral valve replacement and aortic valve replacement with bioprosthetic valves 08/15/2013   39mm Edwards Magna Mitral bovine pericardial tissue valve  . Severe aortic stenosis 11/14   tissue AVR   . Vocal cord paralysis    Past Surgical History:  Procedure Laterality Date  . AORTIC VALVE REPLACEMENT N/A 08/15/2013   Procedure: AORTIC VALVE REPLACEMENT (AVR);  Surgeon: Rexene Alberts, MD;  Location: [facility];  Service: Open Heart Surgery;  Laterality: N/A;  . CATARACT EXTRACTION Bilateral 2011   Dr. Bing Plume  . ESOPHAGOGASTRODUODENOSCOPY N/A 08/02/2013   Procedure: ESOPHAGOGASTRODUODENOSCOPY (EGD);  Surgeon: Casandra Doffing, MD;  Location: [facility] ENDOSCOPY;  Service: Cardiovascular;  Laterality: N/A;  . EYE SURGERY Right    infection  . GROIN MASS OPEN BIOPSY  2012  . HEMORRHOID SURGERY     pt denies.  . INGUINAL HERNIA REPAIR  08/2011  . INTRAOPERATIVE TRANSESOPHAGEAL ECHOCARDIOGRAM N/A 08/15/2013   Procedure: INTRAOPERATIVE TRANSESOPHAGEAL ECHOCARDIOGRAM;  Surgeon: Rexene Alberts, MD;  Location: [facility];  Service: Open Heart Surgery;  Laterality: N/A;  . LEFT AND RIGHT HEART CATHETERIZATION WITH CORONARY ANGIOGRAM N/A 07/05/2013   Procedure: LEFT AND RIGHT HEART CATHETERIZATION WITH CORONARY ANGIOGRAM;  Surgeon: Jettie Booze, MD;  Location: [facility] CATH LAB;  Service: Cardiovascular;  Laterality: N/A;  . MASS EXCISION Right 03/30/2018   Procedure: EXCISION RIGHT FLANK  MASS;  Surgeon: Coralie Keens, MD;  Location: [place];  Service: General;  Laterality: Right;  . MITRAL VALVE REPAIR  09/16/2011   @ [facility], Dr Evelina Dun  . MITRAL VALVE REPAIR N/A 08/15/2013   Procedure: REDO MITRAL VALVE (MV) REPLACEMENT;  Surgeon: Rexene Alberts, MD;  Location: [place];  Service: Open Heart Surgery;  Laterality: N/A;  . TEE WITHOUT  CARDIOVERSION N/A 07/05/2013   Procedure: TRANSESOPHAGEAL ECHOCARDIOGRAM (TEE);  Surgeon: Casandra Doffing, MD;  Location: [facility] ENDOSCOPY;  Service: Cardiovascular;  Laterality: N/A;  . TEE WITHOUT CARDIOVERSION N/A 08/02/2013   Procedure: TRANSESOPHAGEAL ECHOCARDIOGRAM (TEE);  Surgeon: Casandra Doffing, MD;  Location: [facility];  Service: Cardiovascular;  Laterality: N/A;  . UMBILICAL HERNIA REPAIR N/A 03/30/2018   Procedure: HERNIA REPAIR UMBILICAL;  Surgeon: Coralie Keens, MD;  Location[facility];  Service: General;  Laterality: N/A;    FAMILY HISTORY Family History  Problem Relation Age of Onset  . Cancer Father        colon  . Hypertension Father   . Diabetes Father   . Hyperlipidemia Father   . Stroke Father   .  Hypertension Mother   . Diabetes Mother   . Hyperlipidemia Mother   . Stroke Mother   . Heart attack Mother   . Hypertension Brother        MI, S/P MVR    SOCIAL HISTORY Social History   Tobacco Use  . Smoking status: Former Smoker    Years: 8.00    Types: Cigarettes    Quit date: 10/12/1978    Years since quitting: 41.4  . Smokeless tobacco: Never Used  Substance Use Topics  . Alcohol use: No  . Drug use: No         OPHTHALMIC EXAM:  Base Eye Exam    Visual Acuity (Snellen - Linear)      Right Left   Dist cc 20/25 20/200   Dist ph cc  NI   Correction: Glasses       Tonometry (Tonopen, 10:35 AM)      Right Left   Pressure 18 17       Pupils      Pupils Dark Light Shape React APD   Right PERRL 3 2 Round Slow None   Left PERRL 3 2 Round Sluggish +1       Visual Fields (Counting fingers)      Left Right    Full Full       Neuro/Psych    Oriented x3: Yes   Mood/Affect: Normal       Dilation    Right eye: 1.0% Mydriacyl, 2.5% Phenylephrine @ 10:35 AM        Slit Lamp and Fundus Exam    External Exam      Right Left   External Normal Normal       Slit Lamp Exam      Right Left   Lids/Lashes Normal Normal   Conjunctiva/Sclera White  and quiet White and quiet   Cornea Clear Clear   Anterior Chamber Deep and quiet Deep and quiet   Iris Round and reactive Round and reactive   Lens Posterior chamber intraocular lens Posterior chamber intraocular lens   Anterior Vitreous Normal Normal       Fundus Exam      Right Left   Posterior Vitreous Posterior vitreous detachment Normal   Disc Normal    C/D Ratio 0.7    Macula Advanced age related macular degeneration, Atrophy, Retinal pigment epithelial atrophy, Disciform scar, [place]us[place], Mottling, Retinal pigment epithelial mottling    Vessels Normal    Periphery Normal           IMAGING AND PROCEDURES  Imaging and Procedures for 03/06/20  OCT, Retina - OU - Both Eyes       Right Eye Quality was good. Scan locations included subfoveal. Central Foveal Thickness: 320. Progression has improved. Findings include cystoid macular edema, disciform scar, outer retinal tubulation, central retinal atrophy, subretinal scarring.   Left Eye Quality was good. Scan locations included subfoveal. Progression has been stable. Findings include disciform scar, subretinal scarring.        Intravitreal Injection, Pharmacologic Agent - OD - Right Eye       Time Out 03/04/2020. 8:35 AM. Confirmed correct patient, procedure, site, and patient consented.   Anesthesia Anesthetic medications included Akten 3.5%.   Procedure Preparation included Tobramycin 0.3%, 10% betadine to eyelids. A 30 gauge needle was used.   Injection:  0.5 mg ranibizumab (LUCENTIS) 0.5 MG/0.05ML intravitreal injection   NDC: 50242-080-01   Route: Intravitreal, Site: Right Eye, Waste: 0 mg  Post-op Post  injection exam found visual acuity of at least counting fingers. The patient tolerated the procedure well. There were no complications. The patient received written and verbal post procedure care education. Post injection medications were not given.                 ASSESSMENT/PLAN:  Exudative  age-related macular degeneration of right eye with active choroidal neovascularization (HCC) Repeat intravitreal Lucentis 0.5 mg today OD.  Repeat examination in 5 to 6 weeks OD.      ICD-10-CM   1. Exudative age-related macular degeneration of right eye with active choroidal neovascularization (HCC)  H35.3211 OCT, Retina - OU - Both Eyes    Intravitreal Injection, Pharmacologic Agent - OD - Right Eye    ranibizumab (LUCENTIS) 0.5 MG/0.05ML intravitreal injection 0.5 mg  2. Exudative age-related macular degeneration of left eye with inactive scar (HCC)  H35.3223 OCT, Retina - OU - Both Eyes    1.  There is an Internet outage while seeing this patient today and the note could not be completed in a timely fashion on that date.  2.  Nonetheless the injection of Lucentis was performed successfully and without difficulty in a normal fashion to the right eye.  3.  RV 6 weeks for evaluation examination and possible intravitreal Lucentis OD  Ophthalmic Meds Ordered this visit:  Meds ordered this encounter  Medications  . ranibizumab (LUCENTIS) 0.5 MG/0.05ML intravitreal injection 0.5 mg       Return in about 6 weeks (around 04/10/2020) for dilate, OD, LUCENTIS 0.5 OCT.  There are no Patient Instructions on file for this visit.   Explained the diagnoses, plan, and follow up with the patient and they expressed understanding.  Patient expressed understanding of the importance of proper follow up care.   Clent Demark Zyire Eidson M.D. Diseases & Surgery of the Retina and Vitreous [facility] [place] 03/06/20     Abbreviations: M myopia (nearsighted); A astigmatism; H hyperopia (farsighted); P presbyopia; Mrx spectacle prescription;  CTL contact lenses; OD right eye; OS left eye; OU both eyes  XT exotropia; ET esotropia; PEK punctate epithelial keratitis; PEE punctate epithelial erosions; DES dry eye syndrome; MGD meibomian gland dysfunction; ATs artificial tears; PFAT's preservative free  artificial tears; [place] nuclear sclerotic cataract; PSC posterior subcapsular cataract; ERM epi-retinal membrane; PVD posterior vitreous detachment; RD retinal detachment; DM diabetes mellitus; DR diabetic retinopathy; NPDR non-proliferative diabetic retinopathy; PDR proliferative diabetic retinopathy; CSME clinically significant macular edema; DME diabetic macular edema; dbh dot blot hemorrhages; CWS cotton wool spot; POAG primary open angle glaucoma; C/D cup-to-disc ratio; HVF humphrey visual field; GVF goldmann visual field; OCT optical coherence tomography; IOP intraocular pressure; BRVO Branch retinal vein occlusion; CRVO central retinal vein occlusion; CRAO central retinal artery occlusion; BRAO branch retinal artery occlusion; RT retinal tear; SB scleral buckle; PPV pars plana vitrectomy; VH Vitreous hemorrhage; PRP panretinal laser photocoagulation; IVK intravitreal kenalog; VMT vitreomacular traction; MH Macular hole;  NVD neovascularization of the disc; NVE neovascularization elsewhere; AREDS age related eye disease study; ARMD age related macular degeneration; POAG primary open angle glaucoma; EBMD epithelial/anterior basement membrane dystrophy; ACIOL anterior chamber intraocular lens; IOL intraocular lens; PCIOL posterior chamber intraocular lens; Phaco/IOL phacoemulsification with intraocular lens placement; [place] photorefractive keratectomy; LASIK laser assisted in situ keratomileusis; HTN hypertension; DM diabetes mellitus; COPD chronic obstructive pulmonary disease

## 2020-03-04 NOTE — Assessment & Plan Note (Signed)
Repeat intravitreal Lucentis 0.5 mg today OD.  Repeat examination in 5 to 6 weeks OD.

## 2020-03-14 ENCOUNTER — Ambulatory Visit (INDEPENDENT_AMBULATORY_CARE_PROVIDER_SITE_OTHER): Payer: Medicare Other | Admitting: Pharmacist

## 2020-03-14 ENCOUNTER — Other Ambulatory Visit: Payer: Self-pay

## 2020-03-14 DIAGNOSIS — Z953 Presence of xenogenic heart valve: Secondary | ICD-10-CM | POA: Diagnosis not present

## 2020-03-14 DIAGNOSIS — Z5181 Encounter for therapeutic drug level monitoring: Secondary | ICD-10-CM

## 2020-03-14 DIAGNOSIS — Z952 Presence of prosthetic heart valve: Secondary | ICD-10-CM

## 2020-03-14 DIAGNOSIS — I4821 Permanent atrial fibrillation: Secondary | ICD-10-CM

## 2020-03-14 LAB — POCT INR: INR: 1.8 — AB (ref 2.0–3.0)

## 2020-03-14 NOTE — Patient Instructions (Signed)
Description   Take 1.5 tablets tomorrow, then continue taking 1 tablet everyday except 1.5 tablets on Thursdays. Recheck INR in 5 weeks. Call  Coumadin Clinic [phone] with any concerns,  new medications or if scheduled for any procedures

## 2020-04-03 ENCOUNTER — Other Ambulatory Visit: Payer: Self-pay

## 2020-04-03 ENCOUNTER — Encounter (INDEPENDENT_AMBULATORY_CARE_PROVIDER_SITE_OTHER): Payer: Self-pay | Admitting: Ophthalmology

## 2020-04-03 ENCOUNTER — Ambulatory Visit (INDEPENDENT_AMBULATORY_CARE_PROVIDER_SITE_OTHER): Payer: Medicare Other | Admitting: Ophthalmology

## 2020-04-03 DIAGNOSIS — H353211 Exudative age-related macular degeneration, right eye, with active choroidal neovascularization: Secondary | ICD-10-CM

## 2020-04-03 MED ORDER — RANIBIZUMAB 0.5 MG/0.05ML IZ SOLN FOR KALEIDOSCOPE
0.5000 mg | INTRAVITREAL | Status: AC | PRN
  Administered 2020-04-03: .5 mg via INTRAVITREAL

## 2020-04-03 NOTE — Assessment & Plan Note (Signed)
ARMD, active CN VM with residual disease superior to the fovea, stable on chronic injection was sent to 0.5 in this monocular patient ,resistant to Avastin and Eylea use in the past

## 2020-04-03 NOTE — Progress Notes (Signed)
04/03/2020     CHIEF COMPLAINT Patient presents for Retina Follow Up   HISTORY OF PRESENT ILLNESS: Jason [name] is a 86 y.o. male who presents to the clinic today for:   HPI    Retina Follow Up    Patient presents with  Wet AMD.  In right eye.  Duration of 5 weeks.  Since onset it is stable.          Comments    5 week f.u - OCT OU, Poss Lucentis OD Patient denies change in vision and overall has no complaints.        Last edited by Gerda Diss on 04/03/2020  9:44 AM. (History)      Referring physician: Shirline Frees, MD [place],  [place] [address]  HISTORICAL INFORMATION:   Selected notes from the MEDICAL RECORD NUMBER    Lab Results  Component Value Date   HGBA1C 5.4 08/11/2013     CURRENT MEDICATIONS: Current Outpatient Medications (Ophthalmic Drugs)  Medication Sig  . ALPHAGAN P 0.1 % SOLN Place 1 drop 2 (two) times daily into both eyes.   No current facility-administered medications for this visit. (Ophthalmic Drugs)   Current Outpatient Medications (Other)  Medication Sig  . acetaminophen (TYLENOL) 650 MG CR tablet Take 650 mg by mouth 2 (two) times daily.   . Ascorbic Acid (VITAMIN C) 1000 MG tablet Take 1,000 mg by mouth daily.  Marland Kitchen atorvastatin (LIPITOR) 20 MG tablet Take 20 mg by mouth at bedtime.   Marland Kitchen b complex vitamins tablet Take 1 tablet by mouth daily.  . ciprofloxacin (CIPRO) 500 MG tablet Take 500 mg by mouth 2 (two) times daily.  . clindamycin (CLEOCIN) 300 MG capsule Take 2 capsules (600mg ) by mouth once 30-60 minutes prior to abdominal surgery.  Marland Kitchen diltiazem (CARDIZEM CD) 120 MG 24 hr capsule Take 1 capsule (120 mg total) by mouth daily.  . furosemide (LASIX) 40 MG tablet 40 mg in the am and 20 mg in the pm  . lisinopril (ZESTRIL) 10 MG tablet Take 10 mg by mouth daily.  . metoprolol succinate (TOPROL-XL) 25 MG 24 hr tablet Take 0.5 tablets (12.5 mg total) by mouth 2 (two) times daily.  . Multiple  Vitamins-Minerals (PRESERVISION AREDS PO) Take 1 tablet by mouth 2 (two) times daily.   . phenazopyridine (PYRIDIUM) 200 MG tablet Take 200 mg by mouth every 8 (eight) hours as needed.  . warfarin (COUMADIN) 5 MG tablet TAKE AS DIRECTED BY COUMADIN CLINIC   No current facility-administered medications for this visit. (Other)      REVIEW OF SYSTEMS:    ALLERGIES Allergies  Allergen Reactions  . Penicillins Rash and Other (See Comments)    Has patient had a PCN reaction causing immediate rash, facial/tongue/throat swelling, SOB or lightheadedness with hypotension: # # Yes # # Has patient had a PCN reaction causing severe rash involving mucus membranes or skin necrosis: No Has patient had a PCN reaction that required hospitalization: No Has patient had a PCN reaction occurring within the last 10 years: No If all of the above answers are "NO", then may proceed with Cephalosporin use.     PAST MEDICAL HISTORY Past Medical History:  Diagnosis Date  . AK (actinic keratosis)   . Atrial fibrillation, persistent (HCC)    chronic coumadin therapy  . Bifascicular block   . Blindness    left eye  . CKD (chronic kidney disease), stage III   . Coronary  artery disease    20% RCA by 2014 cath  . Elevated PSA   . GERD (gastroesophageal reflux disease)   . H/O hiatal hernia   . Heart murmur   . History of kidney stones   . Hydrocele    left  . Hyperlipidemia   . Hypertension   . Internal hemorrhoids   . Kidney stones    kidney stones  . Macular degeneration    right eye  . Mitral regurgitation 02/25/2011   Recurrent MR s/p mitral valve repair then bioprosthetic MVR 2014  . OA (osteoarthritis) of neck   . S/P aortic valve replacement with bioprosthetic valve 08/15/2013   80mm Edwards Magna Ease bovine pericardial tissue valve  . S/P mitral valve repair 09/15/2004   Complex valvuloplasty including artificial Goretex neocord placement x4 and 53mm SARP ring annuloplasty via right mini  thoracotomy approach - Dr Evelina Dun @ [facility]  . S/P redo mitral valve replacement and aortic valve replacement with bioprosthetic valves 08/15/2013   47mm Edwards Magna Mitral bovine pericardial tissue valve  . Severe aortic stenosis 11/14   tissue AVR   . Vocal cord paralysis    Past Surgical History:  Procedure Laterality Date  . AORTIC VALVE REPLACEMENT N/A 08/15/2013   Procedure: AORTIC VALVE REPLACEMENT (AVR);  Surgeon: Rexene Alberts, MD;  Location: [place];  Service: Open Heart Surgery;  Laterality: N/A;  . CATARACT EXTRACTION Bilateral 2011   Dr. Bing Plume  . ESOPHAGOGASTRODUODENOSCOPY N/A 08/02/2013   Procedure: ESOPHAGOGASTRODUODENOSCOPY (EGD);  Surgeon: Casandra Doffing, MD;  Location: [facility] ENDOSCOPY;  Service: Cardiovascular;  Laterality: N/A;  . EYE SURGERY Right    infection  . GROIN MASS OPEN BIOPSY  2012  . HEMORRHOID SURGERY     pt denies.  . INGUINAL HERNIA REPAIR  08/2011  . INTRAOPERATIVE TRANSESOPHAGEAL ECHOCARDIOGRAM N/A 08/15/2013   Procedure: INTRAOPERATIVE TRANSESOPHAGEAL ECHOCARDIOGRAM;  Surgeon: Rexene Alberts, MD;  Location: [place];  Service: Open Heart Surgery;  Laterality: N/A;  . LEFT AND RIGHT HEART CATHETERIZATION WITH CORONARY ANGIOGRAM N/A 07/05/2013   Procedure: LEFT AND RIGHT HEART CATHETERIZATION WITH CORONARY ANGIOGRAM;  Surgeon: Jettie Booze, MD;  Location: [facility] CATH LAB;  Service: Cardiovascular;  Laterality: N/A;  . MASS EXCISION Right 03/30/2018   Procedure: EXCISION RIGHT FLANK  MASS;  Surgeon: Coralie Keens, MD;  Location: [place];  Service: General;  Laterality: Right;  . MITRAL VALVE REPAIR  09/16/2011   @ [facility], Dr Evelina Dun  . MITRAL VALVE REPAIR N/A 08/15/2013   Procedure: REDO MITRAL VALVE (MV) REPLACEMENT;  Surgeon: Rexene Alberts, MD;  Location: [place];  Service: Open Heart Surgery;  Laterality: N/A;  . TEE WITHOUT CARDIOVERSION N/A 07/05/2013   Procedure: TRANSESOPHAGEAL ECHOCARDIOGRAM (TEE);  Surgeon: Casandra Doffing, MD;  Location: [facility] ENDOSCOPY;   Service: Cardiovascular;  Laterality: N/A;  . TEE WITHOUT CARDIOVERSION N/A 08/02/2013   Procedure: TRANSESOPHAGEAL ECHOCARDIOGRAM (TEE);  Surgeon: Casandra Doffing, MD;  Location: [place];  Service: Cardiovascular;  Laterality: N/A;  . UMBILICAL HERNIA REPAIR N/A 03/30/2018   Procedure: HERNIA REPAIR UMBILICAL;  Surgeon: Coralie Keens, MD;  Location: [facility] OR;  Service: General;  Laterality: N/A;    FAMILY HISTORY Family History  Problem Relation Age of Onset  . Cancer Father        colon  . Hypertension Father   . Diabetes Father   . Hyperlipidemia Father   . Stroke Father   . Hypertension Mother   . Diabetes Mother   . Hyperlipidemia Mother   .  Stroke Mother   . Heart attack Mother   . Hypertension Brother        MI, S/P MVR    SOCIAL HISTORY Social History   Tobacco Use  . Smoking status: Former Smoker    Years: 8.00    Types: Cigarettes    Quit date: 10/12/1978    Years since quitting: 41.5  . Smokeless tobacco: Never Used  Vaping Use  . Vaping Use: Never used  Substance Use Topics  . Alcohol use: No  . Drug use: No         OPHTHALMIC EXAM: Base Eye Exam    Visual Acuity (Snellen - Linear)      Right Left   Dist cc 20/30+2 20/200   Dist ph cc 20/25-2 NI   Correction: Glasses       Tonometry (Tonopen, 9:51 AM)      Right Left   Pressure 17 15       Pupils      Pupils Dark Light Shape React APD   Right PERRL 3 2 Round Slow None   Left PERRL 3 2 Round Sluggish +1       Visual Fields (Counting fingers)      Left Right    Full Full       Extraocular Movement      Right Left    Full Full       Neuro/Psych    Oriented x3: Yes   Mood/Affect: Normal       Dilation    Right eye: 1.0% Mydriacyl, 2.5% Phenylephrine @ 9:51 AM        Slit Lamp and Fundus Exam    External Exam      Right Left   External Normal Normal       Slit Lamp Exam      Right Left   Lids/Lashes Normal Normal   Conjunctiva/Sclera White and quiet White and quiet     Cornea Clear Clear   Anterior Chamber Deep and quiet Deep and quiet   Iris Round and reactive Round and reactive   Lens Posterior chamber intraocular lens Posterior chamber intraocular lens   Anterior Vitreous Normal Normal       Fundus Exam      Right Left   Posterior Vitreous Posterior vitreous detachment Normal   Disc Normal    C/D Ratio 0.7    Macula Advanced age related macular degeneration, Atrophy, Retinal pigment epithelial atrophy, Disciform scar, Drusen, Mottling, Retinal pigment epithelial mottling    Vessels Normal    Periphery Normal           IMAGING AND PROCEDURES  Imaging and Procedures for 04/03/20  OCT, Retina - OU - Both Eyes       Right Eye Quality was good. Scan locations included subfoveal.   Left Eye Quality was good. Scan locations included subfoveal.                 ASSESSMENT/PLAN:  No problem-specific Assessment & Plan notes found for this encounter.    No diagnosis found.  1.  2.  3.  Ophthalmic Meds Ordered this visit:  No orders of the defined types were placed in this encounter.      No follow-ups on file.  There are no Patient Instructions on file for this visit.   Explained the diagnoses, plan, and follow up with the patient and they expressed understanding.  Patient expressed understanding of the importance of proper follow up care.  Clent Demark Levy Cedano M.D. Diseases & Surgery of the Retina and Vitreous Retina & Diabetic [place] 04/03/20     Abbreviations: M myopia (nearsighted); A astigmatism; H hyperopia (farsighted); P presbyopia; Mrx spectacle prescription;  CTL contact lenses; OD right eye; OS left eye; OU both eyes  XT exotropia; ET esotropia; PEK punctate epithelial keratitis; PEE punctate epithelial erosions; DES dry eye syndrome; MGD meibomian gland dysfunction; ATs artificial tears; PFAT's preservative free artificial tears; [place] nuclear sclerotic cataract; PSC posterior subcapsular cataract; ERM  epi-retinal membrane; PVD posterior vitreous detachment; RD retinal detachment; DM diabetes mellitus; DR diabetic retinopathy; NPDR non-proliferative diabetic retinopathy; PDR proliferative diabetic retinopathy; CSME clinically significant macular edema; DME diabetic macular edema; dbh dot blot hemorrhages; CWS cotton wool spot; POAG primary open angle glaucoma; C/D cup-to-disc ratio; HVF humphrey visual field; GVF goldmann visual field; OCT optical coherence tomography; IOP intraocular pressure; BRVO Branch retinal vein occlusion; CRVO central retinal vein occlusion; CRAO central retinal artery occlusion; BRAO branch retinal artery occlusion; RT retinal tear; SB scleral buckle; PPV pars plana vitrectomy; VH Vitreous hemorrhage; PRP panretinal laser photocoagulation; IVK intravitreal kenalog; VMT vitreomacular traction; MH Macular hole;  NVD neovascularization of the disc; NVE neovascularization elsewhere; AREDS age related eye disease study; ARMD age related macular degeneration; POAG primary open angle glaucoma; EBMD epithelial/anterior basement membrane dystrophy; ACIOL anterior chamber intraocular lens; IOL intraocular lens; PCIOL posterior chamber intraocular lens; Phaco/IOL phacoemulsification with intraocular lens placement; [place] photorefractive keratectomy; LASIK laser assisted in situ keratomileusis; HTN hypertension; DM diabetes mellitus; COPD chronic obstructive pulmonary disease

## 2020-04-04 ENCOUNTER — Encounter (INDEPENDENT_AMBULATORY_CARE_PROVIDER_SITE_OTHER): Payer: Medicare Other | Admitting: Ophthalmology

## 2020-04-18 ENCOUNTER — Ambulatory Visit (INDEPENDENT_AMBULATORY_CARE_PROVIDER_SITE_OTHER): Payer: Medicare Other | Admitting: *Deleted

## 2020-04-18 ENCOUNTER — Other Ambulatory Visit: Payer: Self-pay

## 2020-04-18 DIAGNOSIS — Z952 Presence of prosthetic heart valve: Secondary | ICD-10-CM | POA: Diagnosis not present

## 2020-04-18 DIAGNOSIS — Z953 Presence of xenogenic heart valve: Secondary | ICD-10-CM | POA: Diagnosis not present

## 2020-04-18 DIAGNOSIS — Z5181 Encounter for therapeutic drug level monitoring: Secondary | ICD-10-CM

## 2020-04-18 LAB — POCT INR: INR: 1.7 — AB (ref 2.0–3.0)

## 2020-04-18 NOTE — Patient Instructions (Signed)
Description    Take 2 tablets today and then start taking 1 tablets daily except for 1.5 tablets on Sunday and Thursday. Recheck INR in 3 weeks. Call  Coumadin Clinic [phone] with any concerns,  new medications or if scheduled for any procedures

## 2020-05-08 ENCOUNTER — Ambulatory Visit (INDEPENDENT_AMBULATORY_CARE_PROVIDER_SITE_OTHER): Payer: Medicare Other | Admitting: Ophthalmology

## 2020-05-08 ENCOUNTER — Encounter (INDEPENDENT_AMBULATORY_CARE_PROVIDER_SITE_OTHER): Payer: Self-pay | Admitting: Ophthalmology

## 2020-05-08 ENCOUNTER — Other Ambulatory Visit: Payer: Self-pay

## 2020-05-08 DIAGNOSIS — H353211 Exudative age-related macular degeneration, right eye, with active choroidal neovascularization: Secondary | ICD-10-CM | POA: Diagnosis not present

## 2020-05-08 MED ORDER — RANIBIZUMAB 0.5 MG/0.05ML IZ SOLN FOR KALEIDOSCOPE
0.5000 mg | INTRAVITREAL | Status: AC | PRN
  Administered 2020-05-08: .5 mg via INTRAVITREAL

## 2020-05-08 NOTE — Progress Notes (Signed)
05/08/2020     CHIEF COMPLAINT Patient presents for Retina Follow Up   HISTORY OF PRESENT ILLNESS: Jason [name] is a 86 y.o. male who presents to the clinic today for:   HPI    Retina Follow Up    Patient presents with  Wet AMD.  In right eye.  Duration of 5 weeks.  Since onset it is stable.          Comments    5 week follow up - OCT OU, Poss Lucentis OD Patient denies change in vision and overall has no complaints.        Last edited by Gerda Diss on 05/08/2020 11:04 AM. (History)      Referring physician: Shirline Frees, MD [place] [place],  [place] [address]  HISTORICAL INFORMATION:   Selected notes from the MEDICAL RECORD NUMBER    Lab Results  Component Value Date   HGBA1C 5.4 08/11/2013     CURRENT MEDICATIONS: Current Outpatient Medications (Ophthalmic Drugs)  Medication Sig  . ALPHAGAN P 0.1 % SOLN Place 1 drop 2 (two) times daily into both eyes.   No current facility-administered medications for this visit. (Ophthalmic Drugs)   Current Outpatient Medications (Other)  Medication Sig  . acetaminophen (TYLENOL) 650 MG CR tablet Take 650 mg by mouth 2 (two) times daily.   . Ascorbic Acid (VITAMIN C) 1000 MG tablet Take 1,000 mg by mouth daily.  Marland Kitchen atorvastatin (LIPITOR) 20 MG tablet Take 20 mg by mouth at bedtime.   Marland Kitchen b complex vitamins tablet Take 1 tablet by mouth daily.  . ciprofloxacin (CIPRO) 500 MG tablet Take 500 mg by mouth 2 (two) times daily.  . clindamycin (CLEOCIN) 300 MG capsule Take 2 capsules (600mg ) by mouth once 30-60 minutes prior to abdominal surgery.  Marland Kitchen diltiazem (CARDIZEM CD) 120 MG 24 hr capsule Take 1 capsule (120 mg total) by mouth daily.  . furosemide (LASIX) 40 MG tablet 40 mg in the am and 20 mg in the pm  . lisinopril (ZESTRIL) 10 MG tablet Take 10 mg by mouth daily.  . metoprolol succinate (TOPROL-XL) 25 MG 24 hr tablet Take 0.5 tablets (12.5 mg total) by mouth 2 (two) times daily.  . Multiple  Vitamins-Minerals (PRESERVISION AREDS PO) Take 1 tablet by mouth 2 (two) times daily.   . phenazopyridine (PYRIDIUM) 200 MG tablet Take 200 mg by mouth every 8 (eight) hours as needed.  . warfarin (COUMADIN) 5 MG tablet TAKE AS DIRECTED BY COUMADIN CLINIC   No current facility-administered medications for this visit. (Other)      REVIEW OF SYSTEMS:    ALLERGIES Allergies  Allergen Reactions  . Penicillins Rash and Other (See Comments)    Has patient had a PCN reaction causing immediate rash, facial/tongue/throat swelling, SOB or lightheadedness with hypotension: # # Yes # # Has patient had a PCN reaction causing severe rash involving mucus membranes or skin necrosis: No Has patient had a PCN reaction that required hospitalization: No Has patient had a PCN reaction occurring within the last 10 years: No If all of the above answers are "NO", then may proceed with Cephalosporin use.     PAST MEDICAL HISTORY Past Medical History:  Diagnosis Date  . AK (actinic keratosis)   . Atrial fibrillation, persistent (HCC)    chronic coumadin therapy  . Bifascicular block   . Blindness    left eye  . CKD (chronic kidney disease), stage III   . Coronary  artery disease    20% RCA by 2014 cath  . Elevated PSA   . GERD (gastroesophageal reflux disease)   . H/O hiatal hernia   . Heart murmur   . History of kidney stones   . Hydrocele    left  . Hyperlipidemia   . Hypertension   . Internal hemorrhoids   . Kidney stones    kidney stones  . Macular degeneration    right eye  . Mitral regurgitation 02/25/2011   Recurrent MR s/p mitral valve repair then bioprosthetic MVR 2014  . OA (osteoarthritis) of neck   . S/P aortic valve replacement with bioprosthetic valve 08/15/2013   27mm Edwards Magna Ease bovine pericardial tissue valve  . S/P mitral valve repair 09/15/2004   Complex valvuloplasty including artificial Goretex neocord placement x4 and 20mm SARP ring annuloplasty via right mini  thoracotomy approach - Dr Evelina Dun @ [facility]  . S/P redo mitral valve replacement and aortic valve replacement with bioprosthetic valves 08/15/2013   10mm Edwards Magna Mitral bovine pericardial tissue valve  . Severe aortic stenosis 11/14   tissue AVR   . Vocal cord paralysis    Past Surgical History:  Procedure Laterality Date  . AORTIC VALVE REPLACEMENT N/A 08/15/2013   Procedure: AORTIC VALVE REPLACEMENT (AVR);  Surgeon: Rexene Alberts, MD;  Location: [place];  Service: Open Heart Surgery;  Laterality: N/A;  . CATARACT EXTRACTION Bilateral 2011   Dr. Bing Plume  . ESOPHAGOGASTRODUODENOSCOPY N/A 08/02/2013   Procedure: ESOPHAGOGASTRODUODENOSCOPY (EGD);  Surgeon: Casandra Doffing, MD;  Location: [facility] ENDOSCOPY;  Service: Cardiovascular;  Laterality: N/A;  . EYE SURGERY Right    infection  . GROIN MASS OPEN BIOPSY  2012  . HEMORRHOID SURGERY     pt denies.  . INGUINAL HERNIA REPAIR  08/2011  . INTRAOPERATIVE TRANSESOPHAGEAL ECHOCARDIOGRAM N/A 08/15/2013   Procedure: INTRAOPERATIVE TRANSESOPHAGEAL ECHOCARDIOGRAM;  Surgeon: Rexene Alberts, MD;  Location: [place];  Service: Open Heart Surgery;  Laterality: N/A;  . LEFT AND RIGHT HEART CATHETERIZATION WITH CORONARY ANGIOGRAM N/A 07/05/2013   Procedure: LEFT AND RIGHT HEART CATHETERIZATION WITH CORONARY ANGIOGRAM;  Surgeon: Jettie Booze, MD;  Location: [facility] CATH LAB;  Service: Cardiovascular;  Laterality: N/A;  . MASS EXCISION Right 03/30/2018   Procedure: EXCISION RIGHT FLANK  MASS;  Surgeon: Coralie Keens, MD;  Location: [place];  Service: General;  Laterality: Right;  . MITRAL VALVE REPAIR  09/16/2011   @ [facility], Dr Evelina Dun  . MITRAL VALVE REPAIR N/A 08/15/2013   Procedure: REDO MITRAL VALVE (MV) REPLACEMENT;  Surgeon: Rexene Alberts, MD;  Location: [place];  Service: Open Heart Surgery;  Laterality: N/A;  . TEE WITHOUT CARDIOVERSION N/A 07/05/2013   Procedure: TRANSESOPHAGEAL ECHOCARDIOGRAM (TEE);  Surgeon: Casandra Doffing, MD;  Location: [facility] ENDOSCOPY;   Service: Cardiovascular;  Laterality: N/A;  . TEE WITHOUT CARDIOVERSION N/A 08/02/2013   Procedure: TRANSESOPHAGEAL ECHOCARDIOGRAM (TEE);  Surgeon: Casandra Doffing, MD;  Location: [place];  Service: Cardiovascular;  Laterality: N/A;  . UMBILICAL HERNIA REPAIR N/A 03/30/2018   Procedure: HERNIA REPAIR UMBILICAL;  Surgeon: Coralie Keens, MD;  Location: [facility] OR;  Service: General;  Laterality: N/A;    FAMILY HISTORY Family History  Problem Relation Age of Onset  . Cancer Father        colon  . Hypertension Father   . Diabetes Father   . Hyperlipidemia Father   . Stroke Father   . Hypertension Mother   . Diabetes Mother   . Hyperlipidemia Mother   .  Stroke Mother   . Heart attack Mother   . Hypertension Brother        MI, S/P MVR    SOCIAL HISTORY Social History   Tobacco Use  . Smoking status: Former Smoker    Years: 8.00    Types: Cigarettes    Quit date: 10/12/1978    Years since quitting: 41.6  . Smokeless tobacco: Never Used  Vaping Use  . Vaping Use: Never used  Substance Use Topics  . Alcohol use: No  . Drug use: No         OPHTHALMIC EXAM:  Base Eye Exam    Visual Acuity (Snellen - Linear)      Right Left   Dist cc 20/20-2 20/200   Dist ph cc  NI       Tonometry (Tonopen, 11:08 AM)      Right Left   Pressure 17 15       Pupils      Pupils Dark Light Shape React APD   Right PERRL 3 2 Round Slow None   Left PERRL 3 2 Round Sluggish +1       Visual Fields (Counting fingers)      Left Right    Full Full       Extraocular Movement      Right Left    Full Full       Neuro/Psych    Oriented x3: Yes   Mood/Affect: Normal       Dilation    Right eye: 1.0% Mydriacyl, 2.5% Phenylephrine @ 11:08 AM        Slit Lamp and Fundus Exam    External Exam      Right Left   External Normal Normal       Slit Lamp Exam      Right Left   Lids/Lashes Normal Normal   Conjunctiva/Sclera White and quiet White and quiet   Cornea Clear Clear    Anterior Chamber Deep and quiet Deep and quiet   Iris Round and reactive Round and reactive   Lens Posterior chamber intraocular lens Posterior chamber intraocular lens   Anterior Vitreous Normal Normal       Fundus Exam      Right Left   Posterior Vitreous Posterior vitreous detachment Normal   Disc Peripapillary atrophy    C/D Ratio 0.7    Macula Advanced age related macular degeneration, Atrophy, Retinal pigment epithelial atrophy, Disciform scar, Drusen, Mottling, Retinal pigment epithelial mottling    Vessels Normal    Periphery Normal           IMAGING AND PROCEDURES  Imaging and Procedures for 05/08/20  OCT, Retina - OU - Both Eyes       Right Eye Quality was good. Scan locations included subfoveal. Central Foveal Thickness: 333. Progression has been stable. Findings include cystoid macular edema, pigment epithelial detachment, disciform scar, intraretinal fluid.   Left Eye Quality was good. Scan locations included subfoveal. Central Foveal Thickness: 311. Findings include disciform scar, central retinal atrophy, outer retinal atrophy, inner retinal atrophy.   Notes OD, chronically active yet stable, for over 14 years in this office  Active only  Due to lack of treatment during previous heart surgery and then improved again with restoration of intravitreal Lucentis                ASSESSMENT/PLAN:  No problem-specific Assessment & Plan notes found for this encounter.      ICD-10-CM   1.  Exudative age-related macular degeneration of right eye with active choroidal neovascularization (HCC)  H35.3211 OCT, Retina - OU - Both Eyes    1.  2.  3.  Ophthalmic Meds Ordered this visit:  No orders of the defined types were placed in this encounter.      No follow-ups on file.  There are no Patient Instructions on file for this visit.   Explained the diagnoses, plan, and follow up with the patient and they expressed understanding.  Patient expressed  understanding of the importance of proper follow up care.   Clent Demark Stonewall Doss M.D. Diseases & Surgery of the Retina and Vitreous Retina & Diabetic [place] 05/08/20     Abbreviations: M myopia (nearsighted); A astigmatism; H hyperopia (farsighted); P presbyopia; Mrx spectacle prescription;  CTL contact lenses; OD right eye; OS left eye; OU both eyes  XT exotropia; ET esotropia; PEK punctate epithelial keratitis; PEE punctate epithelial erosions; DES dry eye syndrome; MGD meibomian gland dysfunction; ATs artificial tears; PFAT's preservative free artificial tears; [place] nuclear sclerotic cataract; PSC posterior subcapsular cataract; ERM epi-retinal membrane; PVD posterior vitreous detachment; RD retinal detachment; DM diabetes mellitus; DR diabetic retinopathy; NPDR non-proliferative diabetic retinopathy; PDR proliferative diabetic retinopathy; CSME clinically significant macular edema; DME diabetic macular edema; dbh dot blot hemorrhages; CWS cotton wool spot; POAG primary open angle glaucoma; C/D cup-to-disc ratio; HVF humphrey visual field; GVF goldmann visual field; OCT optical coherence tomography; IOP intraocular pressure; BRVO Branch retinal vein occlusion; CRVO central retinal vein occlusion; CRAO central retinal artery occlusion; BRAO branch retinal artery occlusion; RT retinal tear; SB scleral buckle; PPV pars plana vitrectomy; VH Vitreous hemorrhage; PRP panretinal laser photocoagulation; IVK intravitreal kenalog; VMT vitreomacular traction; MH Macular hole;  NVD neovascularization of the disc; NVE neovascularization elsewhere; AREDS age related eye disease study; ARMD age related macular degeneration; POAG primary open angle glaucoma; EBMD epithelial/anterior basement membrane dystrophy; ACIOL anterior chamber intraocular lens; IOL intraocular lens; PCIOL posterior chamber intraocular lens; Phaco/IOL phacoemulsification with intraocular lens placement; [place] photorefractive keratectomy; LASIK laser  assisted in situ keratomileusis; HTN hypertension; DM diabetes mellitus; COPD chronic obstructive pulmonary disease

## 2020-05-08 NOTE — Assessment & Plan Note (Signed)
Active only  Due to lack of treatment during previous heart surgery and then improved again with restoration of intravitreal Lucentis  We will repeat intravitreal Lucentis OD today for chronic stabilization of chronically active lesion juxta foveal

## 2020-05-09 ENCOUNTER — Ambulatory Visit (INDEPENDENT_AMBULATORY_CARE_PROVIDER_SITE_OTHER): Payer: Medicare Other | Admitting: *Deleted

## 2020-05-09 DIAGNOSIS — Z5181 Encounter for therapeutic drug level monitoring: Secondary | ICD-10-CM | POA: Diagnosis not present

## 2020-05-09 DIAGNOSIS — Z952 Presence of prosthetic heart valve: Secondary | ICD-10-CM

## 2020-05-09 DIAGNOSIS — Z953 Presence of xenogenic heart valve: Secondary | ICD-10-CM | POA: Diagnosis not present

## 2020-05-09 DIAGNOSIS — I4891 Unspecified atrial fibrillation: Secondary | ICD-10-CM

## 2020-05-09 LAB — POCT INR: INR: 2.6 (ref 2.0–3.0)

## 2020-05-09 NOTE — Patient Instructions (Signed)
Description   Continue taking Warfarin 1 tablet daily except for 1.5 tablets on Sunday and Thursday. Recheck INR in 4 weeks. Call  Coumadin Clinic [phone] with any concerns,  new medications or if scheduled for any procedures

## 2020-05-30 DIAGNOSIS — E538 Deficiency of other specified B group vitamins: Secondary | ICD-10-CM | POA: Diagnosis not present

## 2020-05-30 DIAGNOSIS — E559 Vitamin D deficiency, unspecified: Secondary | ICD-10-CM | POA: Diagnosis not present

## 2020-05-30 DIAGNOSIS — I10 Essential (primary) hypertension: Secondary | ICD-10-CM | POA: Diagnosis not present

## 2020-05-30 DIAGNOSIS — I4891 Unspecified atrial fibrillation: Secondary | ICD-10-CM | POA: Diagnosis not present

## 2020-05-30 DIAGNOSIS — E782 Mixed hyperlipidemia: Secondary | ICD-10-CM | POA: Diagnosis not present

## 2020-05-30 DIAGNOSIS — Z0000 Encounter for general adult medical examination without abnormal findings: Secondary | ICD-10-CM | POA: Diagnosis not present

## 2020-06-06 ENCOUNTER — Other Ambulatory Visit: Payer: Self-pay

## 2020-06-06 ENCOUNTER — Ambulatory Visit (INDEPENDENT_AMBULATORY_CARE_PROVIDER_SITE_OTHER): Payer: Medicare Other

## 2020-06-06 DIAGNOSIS — Z5181 Encounter for therapeutic drug level monitoring: Secondary | ICD-10-CM

## 2020-06-06 DIAGNOSIS — Z952 Presence of prosthetic heart valve: Secondary | ICD-10-CM | POA: Diagnosis not present

## 2020-06-06 DIAGNOSIS — I4891 Unspecified atrial fibrillation: Secondary | ICD-10-CM

## 2020-06-06 DIAGNOSIS — Z953 Presence of xenogenic heart valve: Secondary | ICD-10-CM

## 2020-06-06 LAB — POCT INR: INR: 2.5 (ref 2.0–3.0)

## 2020-06-06 NOTE — Patient Instructions (Signed)
Description   Continue taking Warfarin 1 tablet daily except for 1.5 tablets on Sundays and Thursdays. Recheck INR in 5 weeks. Call  Coumadin Clinic [phone] with any concerns,  new medications or if scheduled for any procedures

## 2020-06-19 ENCOUNTER — Encounter (INDEPENDENT_AMBULATORY_CARE_PROVIDER_SITE_OTHER): Payer: Self-pay | Admitting: Ophthalmology

## 2020-06-19 ENCOUNTER — Ambulatory Visit (INDEPENDENT_AMBULATORY_CARE_PROVIDER_SITE_OTHER): Payer: Medicare Other | Admitting: Ophthalmology

## 2020-06-19 ENCOUNTER — Other Ambulatory Visit: Payer: Self-pay

## 2020-06-19 DIAGNOSIS — H353223 Exudative age-related macular degeneration, left eye, with inactive scar: Secondary | ICD-10-CM | POA: Diagnosis not present

## 2020-06-19 DIAGNOSIS — H353211 Exudative age-related macular degeneration, right eye, with active choroidal neovascularization: Secondary | ICD-10-CM | POA: Diagnosis not present

## 2020-06-19 MED ORDER — RANIBIZUMAB 0.5 MG/0.05ML IZ SOLN FOR KALEIDOSCOPE
0.5000 mg | INTRAVITREAL | Status: AC | PRN
  Administered 2020-06-19: .5 mg via INTRAVITREAL

## 2020-06-19 NOTE — Patient Instructions (Signed)
Patient to report new onset visual acuity decline or distortions 

## 2020-06-19 NOTE — Progress Notes (Signed)
06/19/2020     CHIEF COMPLAINT Patient presents for Retina Follow Up   HISTORY OF PRESENT ILLNESS: Jason [name] is a 86 y.o. male who presents to the clinic today for:   HPI    Retina Follow Up    Patient presents with  Wet AMD.  In right eye.  Severity is moderate.  Duration of 6 weeks.  Since onset it is stable.  I, the attending physician,  performed the HPI with the patient and updated documentation appropriately.          Comments    6 Week Wet AMD f\u OD. 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 OCT  Pt states no changes in vision. Denies any complaints.       Last edited by Tilda Franco on 06/19/2020 10:04 AM. (History)      Referring physician: Shirline Frees, MD [place],  [place] [address]  HISTORICAL INFORMATION:   Selected notes from the MEDICAL RECORD NUMBER    Lab Results  Component Value Date   HGBA1C 5.4 08/11/2013     CURRENT MEDICATIONS: Current Outpatient Medications (Ophthalmic Drugs)  Medication Sig  . ALPHAGAN P 0.1 % SOLN Place 1 drop 2 (two) times daily into both eyes.   No current facility-administered medications for this visit. (Ophthalmic Drugs)   Current Outpatient Medications (Other)  Medication Sig  . acetaminophen (TYLENOL) 650 MG CR tablet Take 650 mg by mouth 2 (two) times daily.   . Ascorbic Acid (VITAMIN C) 1000 MG tablet Take 1,000 mg by mouth daily.  Marland Kitchen atorvastatin (LIPITOR) 20 MG tablet Take 20 mg by mouth at bedtime.   Marland Kitchen b complex vitamins tablet Take 1 tablet by mouth daily.  . ciprofloxacin (CIPRO) 500 MG tablet Take 500 mg by mouth 2 (two) times daily.  . clindamycin (CLEOCIN) 300 MG capsule Take 2 capsules (600mg ) by mouth once 30-60 minutes prior to abdominal surgery.  Marland Kitchen diltiazem (CARDIZEM CD) 120 MG 24 hr capsule Take 1 capsule (120 mg total) by mouth daily.  . furosemide (LASIX) 40 MG tablet 40 mg in the am and 20 mg in the pm  . lisinopril (ZESTRIL) 10 MG tablet Take 10 mg by mouth daily.  .  metoprolol succinate (TOPROL-XL) 25 MG 24 hr tablet Take 0.5 tablets (12.5 mg total) by mouth 2 (two) times daily.  . Multiple Vitamins-Minerals (PRESERVISION AREDS PO) Take 1 tablet by mouth 2 (two) times daily.   . phenazopyridine (PYRIDIUM) 200 MG tablet Take 200 mg by mouth every 8 (eight) hours as needed.  . warfarin (COUMADIN) 5 MG tablet TAKE AS DIRECTED BY COUMADIN CLINIC   No current facility-administered medications for this visit. (Other)      REVIEW OF SYSTEMS:    ALLERGIES Allergies  Allergen Reactions  . Penicillins Rash and Other (See Comments)    Has patient had a PCN reaction causing immediate rash, facial/tongue/throat swelling, SOB or lightheadedness with hypotension: # # Yes # # Has patient had a PCN reaction causing severe rash involving mucus membranes or skin necrosis: No Has patient had a PCN reaction that required hospitalization: No Has patient had a PCN reaction occurring within the last 10 years: No If all of the above answers are "NO", then may proceed with Cephalosporin use.     PAST MEDICAL HISTORY Past Medical History:  Diagnosis Date  . AK (actinic keratosis)   . Atrial fibrillation, persistent (HCC)    chronic coumadin therapy  . Bifascicular block   .  Blindness    left eye  . CKD (chronic kidney disease), stage III   . Coronary artery disease    20% RCA by 2014 cath  . Elevated PSA   . GERD (gastroesophageal reflux disease)   . H/O hiatal hernia   . Heart murmur   . History of kidney stones   . Hydrocele    left  . Hyperlipidemia   . Hypertension   . Internal hemorrhoids   . Kidney stones    kidney stones  . Macular degeneration    right eye  . Mitral regurgitation 02/25/2011   Recurrent MR s/p mitral valve repair then bioprosthetic MVR 2014  . OA (osteoarthritis) of neck   . S/P aortic valve replacement with bioprosthetic valve 08/15/2013   56mm [place] Magna Ease bovine pericardial tissue valve  . S/P mitral valve repair  09/15/2004   Complex valvuloplasty including artificial Goretex neocord placement x4 and 56mm SARP ring annuloplasty via right mini thoracotomy approach - Dr Evelina Dun @ [facility]  . S/P redo mitral valve replacement and aortic valve replacement with bioprosthetic valves 08/15/2013   39mm [place] Magna Mitral bovine pericardial tissue valve  . Severe aortic stenosis 11/14   tissue AVR   . Vocal cord paralysis    Past Surgical History:  Procedure Laterality Date  . AORTIC VALVE REPLACEMENT N/A 08/15/2013   Procedure: AORTIC VALVE REPLACEMENT (AVR);  Surgeon: Rexene Alberts, MD;  Location: [facility];  Service: Open Heart Surgery;  Laterality: N/A;  . CATARACT EXTRACTION Bilateral 2011   Dr. Bing Plume  . ESOPHAGOGASTRODUODENOSCOPY N/A 08/02/2013   Procedure: ESOPHAGOGASTRODUODENOSCOPY (EGD);  Surgeon: Casandra Doffing, MD;  Location: [facility] ENDOSCOPY;  Service: Cardiovascular;  Laterality: N/A;  . EYE SURGERY Right    infection  . GROIN MASS OPEN BIOPSY  2012  . HEMORRHOID SURGERY     pt denies.  . INGUINAL HERNIA REPAIR  08/2011  . INTRAOPERATIVE TRANSESOPHAGEAL ECHOCARDIOGRAM N/A 08/15/2013   Procedure: INTRAOPERATIVE TRANSESOPHAGEAL ECHOCARDIOGRAM;  Surgeon: Rexene Alberts, MD;  Location: [facility];  Service: Open Heart Surgery;  Laterality: N/A;  . LEFT AND RIGHT HEART CATHETERIZATION WITH CORONARY ANGIOGRAM N/A 07/05/2013   Procedure: LEFT AND RIGHT HEART CATHETERIZATION WITH CORONARY ANGIOGRAM;  Surgeon: Jettie Booze, MD;  Location: [facility] CATH LAB;  Service: Cardiovascular;  Laterality: N/A;  . MASS EXCISION Right 03/30/2018   Procedure: EXCISION RIGHT FLANK  MASS;  Surgeon: Coralie Keens, MD;  Location: [facility];  Service: General;  Laterality: Right;  . MITRAL VALVE REPAIR  09/16/2011   @ [facility], Dr Evelina Dun  . MITRAL VALVE REPAIR N/A 08/15/2013   Procedure: REDO MITRAL VALVE (MV) REPLACEMENT;  Surgeon: Rexene Alberts, MD;  Location: [place] [facility];  Service: Open Heart Surgery;  Laterality: N/A;  . TEE WITHOUT  CARDIOVERSION N/A 07/05/2013   Procedure: TRANSESOPHAGEAL ECHOCARDIOGRAM (TEE);  Surgeon: Casandra Doffing, MD;  Location: [facility] ENDOSCOPY;  Service: Cardiovascular;  Laterality: N/A;  . TEE WITHOUT CARDIOVERSION N/A 08/02/2013   Procedure: TRANSESOPHAGEAL ECHOCARDIOGRAM (TEE);  Surgeon: Casandra Doffing, MD;  Location: [facility];  Service: Cardiovascular;  Laterality: N/A;  . UMBILICAL HERNIA REPAIR N/A 03/30/2018   Procedure: HERNIA REPAIR UMBILICAL;  Surgeon: Coralie Keens, MD;  Location[facility] OR;  Service: General;  Laterality: N/A;    FAMILY HISTORY Family History  Problem Relation Age of Onset  . Cancer Father        colon  . Hypertension Father   . Diabetes Father   . Hyperlipidemia Father   . Stroke Father   .  Hypertension Mother   . Diabetes Mother   . Hyperlipidemia Mother   . Stroke Mother   . Heart attack Mother   . Hypertension Brother        MI, S/P MVR    SOCIAL HISTORY Social History   Tobacco Use  . Smoking status: Former Smoker    Years: 8.00    Types: Cigarettes    Quit date: 10/12/1978    Years since quitting: 41.7  . Smokeless tobacco: Never Used  Vaping Use  . Vaping Use: Never used  Substance Use Topics  . Alcohol use: No  . Drug use: No         OPHTHALMIC EXAM:  Base Eye Exam    Visual Acuity (Snellen - Linear)      Right Left   Dist cc 20/25 20/200   Dist ph cc  NI   Correction: Glasses       Tonometry (Tonopen, 10:09 AM)      Right Left   Pressure 16 15       Pupils      Dark Light Shape React APD   Right 3 2 Round Slow None   Left 3 2 Round Sluggish None       Visual Fields (Counting fingers)      Left Right    Full Full       Neuro/Psych    Oriented x3: Yes   Mood/Affect: Normal       Dilation    Right eye: 1.0% Mydriacyl, 2.5% Phenylephrine @ 10:09 AM        Slit Lamp and Fundus Exam    External Exam      Right Left   External Normal Normal       Slit Lamp Exam      Right Left   Lids/Lashes Normal Normal    Conjunctiva/Sclera White and quiet White and quiet   Cornea Clear Clear   Anterior Chamber Deep and quiet Deep and quiet   Iris Round and reactive Round and reactive   Lens Posterior chamber intraocular lens Posterior chamber intraocular lens   Anterior Vitreous Normal Normal       Fundus Exam      Right Left   Posterior Vitreous Posterior vitreous detachment Normal   Disc Peripapillary atrophy    C/D Ratio 0.7    Macula Advanced age related macular degeneration, Atrophy, Retinal pigment epithelial atrophy, Disciform scar, Drusen, Mottling, Retinal pigment epithelial mottling    Vessels Normal    Periphery Normal           IMAGING AND PROCEDURES  Imaging and Procedures for 06/19/20  OCT, Retina - OU - Both Eyes       Right Eye Quality was good. Scan locations included subfoveal. Central Foveal Thickness: 327. Progression has been stable. Findings include cystoid macular edema, pigment epithelial detachment, disciform scar, intraretinal fluid.   Left Eye Quality was borderline. Scan locations included subfoveal. Central Foveal Thickness: 288. Findings include disciform scar, central retinal atrophy, outer retinal atrophy, inner retinal atrophy.   Notes OD, chronically active yet stable, for over 15 years in this office  Active only  Due to lack of treatment during previous heart surgery and then improved again with restoration of intravitreal Lucentis       Intravitreal Injection, Pharmacologic Agent - OD - Right Eye       Time Out 06/19/2020. 11:27 AM. Confirmed correct patient, procedure, site, and patient consented.   Anesthesia Anesthetic medications included  [place] 3.5%.   Procedure Preparation included Tobramycin 0.3%, eyelid speculum, 10% betadine to eyelids, 5% betadine to ocular surface. A 30 gauge needle was used.   Injection:  0.5 mg ranibizumab (LUCENTIS) 0.5 MG/0.05ML intravitreal injection   NDC: 50242-080-01, Lot: [id number]   Route: Intravitreal,  Site: Right Eye, Waste: 0 mg  Post-op Post injection exam found visual acuity of at least counting fingers. The patient tolerated the procedure well. There were no complications. The patient received written and verbal post procedure care education.                 ASSESSMENT/PLAN:  Exudative age-related macular degeneration of right eye with active choroidal neovascularization (HCC) Chronic lesion subfoveal OD with vascularized PED, overall slightly less intraretinal fluid today yet persistent.  We will repeat intravitreal Lucentis today, as the patient is now going on some 15 years of therapy or more      ICD-10-CM   1. Exudative age-related macular degeneration of right eye with active choroidal neovascularization (HCC)  H35.3211 OCT, Retina - OU - Both Eyes    Intravitreal Injection, Pharmacologic Agent - OD - Right Eye    ranibizumab (LUCENTIS) 0.5 MG/0.05ML intravitreal injection 0.5 mg  2. Exudative age-related macular degeneration of left eye with inactive scar ([place])  H35.3223     1.  Repeat injection intravitreal Lucentis OD today and examination in 6 weeks  2.  3.  Ophthalmic Meds Ordered this visit:  Meds ordered this encounter  Medications  . ranibizumab (LUCENTIS) 0.5 MG/0.05ML intravitreal injection 0.5 mg       Return in about 6 weeks (around 07/31/2020) for dilate, OD, LUCENTIS 0.5 OCT.  Patient Instructions  Patient to report new onset visual acuity decline or distortions    Explained the diagnoses, plan, and follow up with the patient and they expressed understanding.  Patient expressed understanding of the importance of proper follow up care.   Clent Demark Anan Dapolito M.D. Diseases & Surgery of the Retina and Vitreous [facility] [place] 06/19/20     Abbreviations: M myopia (nearsighted); A astigmatism; H hyperopia (farsighted); P presbyopia; Mrx spectacle prescription;  CTL contact lenses; OD right eye; OS left eye; OU both eyes  XT  exotropia; ET esotropia; PEK punctate epithelial keratitis; PEE punctate epithelial erosions; DES dry eye syndrome; MGD meibomian gland dysfunction; ATs artificial tears; PFAT's preservative free artificial tears; [place] nuclear sclerotic cataract; PSC posterior subcapsular cataract; ERM epi-retinal membrane; PVD posterior vitreous detachment; RD retinal detachment; DM diabetes mellitus; DR diabetic retinopathy; NPDR non-proliferative diabetic retinopathy; PDR proliferative diabetic retinopathy; CSME clinically significant macular edema; DME diabetic macular edema; dbh dot blot hemorrhages; CWS cotton wool spot; POAG primary open angle glaucoma; C/D cup-to-disc ratio; HVF humphrey visual field; GVF goldmann visual field; OCT optical coherence tomography; IOP intraocular pressure; BRVO Branch retinal vein occlusion; CRVO central retinal vein occlusion; CRAO central retinal artery occlusion; BRAO branch retinal artery occlusion; RT retinal tear; SB scleral buckle; PPV pars plana vitrectomy; VH Vitreous hemorrhage; PRP panretinal laser photocoagulation; IVK intravitreal kenalog; VMT vitreomacular traction; MH Macular hole;  NVD neovascularization of the disc; NVE neovascularization elsewhere; AREDS age related eye disease study; ARMD age related macular degeneration; POAG primary open angle glaucoma; EBMD epithelial/anterior basement membrane dystrophy; ACIOL anterior chamber intraocular lens; IOL intraocular lens; PCIOL posterior chamber intraocular lens; Phaco/IOL phacoemulsification with intraocular lens placement; [place] photorefractive keratectomy; LASIK laser assisted in situ keratomileusis; HTN hypertension; DM diabetes mellitus; COPD chronic obstructive pulmonary disease

## 2020-06-19 NOTE — Assessment & Plan Note (Signed)
Chronic lesion subfoveal OD with vascularized PED, overall slightly less intraretinal fluid today yet persistent.  We will repeat intravitreal Lucentis today, as the patient is now going on some 15 years of therapy or more

## 2020-07-03 DIAGNOSIS — Z23 Encounter for immunization: Secondary | ICD-10-CM | POA: Diagnosis not present

## 2020-07-11 ENCOUNTER — Ambulatory Visit (INDEPENDENT_AMBULATORY_CARE_PROVIDER_SITE_OTHER): Payer: Medicare Other | Admitting: *Deleted

## 2020-07-11 ENCOUNTER — Other Ambulatory Visit: Payer: Self-pay

## 2020-07-11 DIAGNOSIS — Z953 Presence of xenogenic heart valve: Secondary | ICD-10-CM

## 2020-07-11 DIAGNOSIS — Z952 Presence of prosthetic heart valve: Secondary | ICD-10-CM | POA: Diagnosis not present

## 2020-07-11 DIAGNOSIS — I4891 Unspecified atrial fibrillation: Secondary | ICD-10-CM | POA: Diagnosis not present

## 2020-07-11 DIAGNOSIS — Z5181 Encounter for therapeutic drug level monitoring: Secondary | ICD-10-CM | POA: Diagnosis not present

## 2020-07-11 LAB — POCT INR: INR: 2.6 (ref 2.0–3.0)

## 2020-07-11 NOTE — Patient Instructions (Signed)
Description   Continue taking Warfarin 1 tablet daily except for 1.5 tablets on Sundays and Thursdays. Recheck INR in 6 weeks. Call  Coumadin Clinic [phone] with any concerns,  new medications or if scheduled for any procedures

## 2020-07-24 ENCOUNTER — Other Ambulatory Visit (INDEPENDENT_AMBULATORY_CARE_PROVIDER_SITE_OTHER): Payer: Self-pay | Admitting: Ophthalmology

## 2020-07-31 ENCOUNTER — Encounter (INDEPENDENT_AMBULATORY_CARE_PROVIDER_SITE_OTHER): Payer: Self-pay | Admitting: Ophthalmology

## 2020-07-31 ENCOUNTER — Ambulatory Visit (INDEPENDENT_AMBULATORY_CARE_PROVIDER_SITE_OTHER): Payer: Medicare Other | Admitting: Ophthalmology

## 2020-07-31 ENCOUNTER — Other Ambulatory Visit: Payer: Self-pay

## 2020-07-31 DIAGNOSIS — H4051X1 Glaucoma secondary to other eye disorders, right eye, mild stage: Secondary | ICD-10-CM | POA: Diagnosis not present

## 2020-07-31 DIAGNOSIS — H40013 Open angle with borderline findings, low risk, bilateral: Secondary | ICD-10-CM | POA: Diagnosis not present

## 2020-07-31 DIAGNOSIS — H353223 Exudative age-related macular degeneration, left eye, with inactive scar: Secondary | ICD-10-CM

## 2020-07-31 DIAGNOSIS — H353211 Exudative age-related macular degeneration, right eye, with active choroidal neovascularization: Secondary | ICD-10-CM

## 2020-07-31 MED ORDER — RANIBIZUMAB 0.5 MG/0.05ML IZ SOLN FOR KALEIDOSCOPE
0.5000 mg | INTRAVITREAL | Status: AC | PRN
  Administered 2020-07-31: .5 mg via INTRAVITREAL

## 2020-07-31 NOTE — Assessment & Plan Note (Signed)
Exudative CNVM OD, mature fibrotic with intraretinal fluid superotemporally.  Stable overall.  We will repeat injection Lucentis today.

## 2020-07-31 NOTE — Assessment & Plan Note (Signed)
Currently not active, this has been in the past with injection of intraocular medications

## 2020-07-31 NOTE — Progress Notes (Signed)
07/31/2020     CHIEF COMPLAINT Patient presents for Retina Follow Up   HISTORY OF PRESENT ILLNESS: Jason [name] is a 86 y.o. male who presents to the clinic today for:   HPI    Retina Follow Up    Patient presents with  Wet AMD.  In right eye.  Severity is moderate.  Duration of 6 weeks.  Since onset it is stable.  I, the attending physician,  performed the HPI with the patient and updated documentation appropriately.          Comments    6 Week Wet AMD f\u OD. 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 OCT  Pt states vision is the same. Denies any complaints.       Last edited by Tilda Franco on 07/31/2020 10:17 AM. (History)      Referring physician: Shirline Frees, MD [place],  [place] [address]  HISTORICAL INFORMATION:   Selected notes from the MEDICAL RECORD NUMBER    Lab Results  Component Value Date   HGBA1C 5.4 08/11/2013     CURRENT MEDICATIONS: Current Outpatient Medications (Ophthalmic Drugs)  Medication Sig  . ALPHAGAN P 0.1 % SOLN INSTILL 1 DROP IN BOTH EYES TWICE A DAY   No current facility-administered medications for this visit. (Ophthalmic Drugs)   Current Outpatient Medications (Other)  Medication Sig  . acetaminophen (TYLENOL) 650 MG CR tablet Take 650 mg by mouth 2 (two) times daily.   . Ascorbic Acid (VITAMIN C) 1000 MG tablet Take 1,000 mg by mouth daily.  Marland Kitchen atorvastatin (LIPITOR) 20 MG tablet Take 20 mg by mouth at bedtime.   Marland Kitchen b complex vitamins tablet Take 1 tablet by mouth daily.  . ciprofloxacin (CIPRO) 500 MG tablet Take 500 mg by mouth 2 (two) times daily.  . clindamycin (CLEOCIN) 300 MG capsule Take 2 capsules (600mg ) by mouth once 30-60 minutes prior to abdominal surgery.  Marland Kitchen diltiazem (CARDIZEM CD) 120 MG 24 hr capsule Take 1 capsule (120 mg total) by mouth daily.  . furosemide (LASIX) 40 MG tablet 40 mg in the am and 20 mg in the pm  . lisinopril (ZESTRIL) 10 MG tablet Take 10 mg by mouth daily.  . metoprolol  succinate (TOPROL-XL) 25 MG 24 hr tablet Take 0.5 tablets (12.5 mg total) by mouth 2 (two) times daily.  . Multiple Vitamins-Minerals (PRESERVISION AREDS PO) Take 1 tablet by mouth 2 (two) times daily.   . phenazopyridine (PYRIDIUM) 200 MG tablet Take 200 mg by mouth every 8 (eight) hours as needed.  . warfarin (COUMADIN) 5 MG tablet TAKE AS DIRECTED BY COUMADIN CLINIC   No current facility-administered medications for this visit. (Other)      REVIEW OF SYSTEMS:    ALLERGIES Allergies  Allergen Reactions  . Penicillins Rash and Other (See Comments)    Has patient had a PCN reaction causing immediate rash, facial/tongue/throat swelling, SOB or lightheadedness with hypotension: # # Yes # # Has patient had a PCN reaction causing severe rash involving mucus membranes or skin necrosis: No Has patient had a PCN reaction that required hospitalization: No Has patient had a PCN reaction occurring within the last 10 years: No If all of the above answers are "NO", then may proceed with Cephalosporin use.     PAST MEDICAL HISTORY Past Medical History:  Diagnosis Date  . AK (actinic keratosis)   . Atrial fibrillation, persistent (HCC)    chronic coumadin therapy  . Bifascicular block   .  Blindness    left eye  . CKD (chronic kidney disease), stage III ([place])   . Coronary artery disease    20% RCA by 2014 cath  . Elevated PSA   . GERD (gastroesophageal reflux disease)   . H/O hiatal hernia   . Heart murmur   . History of kidney stones   . Hydrocele    left  . Hyperlipidemia   . Hypertension   . Internal hemorrhoids   . Kidney stones    kidney stones  . Macular degeneration    right eye  . Mitral regurgitation 02/25/2011   Recurrent MR s/p mitral valve repair then bioprosthetic MVR 2014  . OA (osteoarthritis) of neck   . S/P aortic valve replacement with bioprosthetic valve 08/15/2013   75mm Edwards Magna Ease bovine pericardial tissue valve  . S/P mitral valve repair  09/15/2004   Complex valvuloplasty including artificial Goretex neocord placement x4 and 64mm SARP ring annuloplasty via right mini thoracotomy approach - Dr Evelina Dun @ [facility]  . S/P redo mitral valve replacement and aortic valve replacement with bioprosthetic valves 08/15/2013   88mm Edwards Magna Mitral bovine pericardial tissue valve  . Severe aortic stenosis 11/14   tissue AVR   . Vocal cord paralysis    Past Surgical History:  Procedure Laterality Date  . AORTIC VALVE REPLACEMENT N/A 08/15/2013   Procedure: AORTIC VALVE REPLACEMENT (AVR);  Surgeon: Rexene Alberts, MD;  Location: [place];  Service: Open Heart Surgery;  Laterality: N/A;  . CATARACT EXTRACTION Bilateral 2011   Dr. Bing Plume  . ESOPHAGOGASTRODUODENOSCOPY N/A 08/02/2013   Procedure: ESOPHAGOGASTRODUODENOSCOPY (EGD);  Surgeon: Casandra Doffing, MD;  Location: [facility] ENDOSCOPY;  Service: Cardiovascular;  Laterality: N/A;  . EYE SURGERY Right    infection  . GROIN MASS OPEN BIOPSY  2012  . HEMORRHOID SURGERY     pt denies.  . INGUINAL HERNIA REPAIR  08/2011  . INTRAOPERATIVE TRANSESOPHAGEAL ECHOCARDIOGRAM N/A 08/15/2013   Procedure: INTRAOPERATIVE TRANSESOPHAGEAL ECHOCARDIOGRAM;  Surgeon: Rexene Alberts, MD;  Location: [place];  Service: Open Heart Surgery;  Laterality: N/A;  . LEFT AND RIGHT HEART CATHETERIZATION WITH CORONARY ANGIOGRAM N/A 07/05/2013   Procedure: LEFT AND RIGHT HEART CATHETERIZATION WITH CORONARY ANGIOGRAM;  Surgeon: Jettie Booze, MD;  Location: [facility] CATH LAB;  Service: Cardiovascular;  Laterality: N/A;  . MASS EXCISION Right 03/30/2018   Procedure: EXCISION RIGHT FLANK  MASS;  Surgeon: Coralie Keens, MD;  Location: [place];  Service: General;  Laterality: Right;  . MITRAL VALVE REPAIR  09/16/2011   @ [facility], Dr Evelina Dun  . MITRAL VALVE REPAIR N/A 08/15/2013   Procedure: REDO MITRAL VALVE (MV) REPLACEMENT;  Surgeon: Rexene Alberts, MD;  Location: [place];  Service: Open Heart Surgery;  Laterality: N/A;  . TEE WITHOUT  CARDIOVERSION N/A 07/05/2013   Procedure: TRANSESOPHAGEAL ECHOCARDIOGRAM (TEE);  Surgeon: Casandra Doffing, MD;  Location: [facility] ENDOSCOPY;  Service: Cardiovascular;  Laterality: N/A;  . TEE WITHOUT CARDIOVERSION N/A 08/02/2013   Procedure: TRANSESOPHAGEAL ECHOCARDIOGRAM (TEE);  Surgeon: Casandra Doffing, MD;  Location: [place];  Service: Cardiovascular;  Laterality: N/A;  . UMBILICAL HERNIA REPAIR N/A 03/30/2018   Procedure: HERNIA REPAIR UMBILICAL;  Surgeon: Coralie Keens, MD;  Location: [facility] OR;  Service: General;  Laterality: N/A;    FAMILY HISTORY Family History  Problem Relation Age of Onset  . Cancer Father        colon  . Hypertension Father   . Diabetes Father   . Hyperlipidemia Father   . Stroke  Father   . Hypertension Mother   . Diabetes Mother   . Hyperlipidemia Mother   . Stroke Mother   . Heart attack Mother   . Hypertension Brother        MI, S/P MVR    SOCIAL HISTORY Social History   Tobacco Use  . Smoking status: Former Smoker    Years: 8.00    Types: Cigarettes    Quit date: 10/12/1978    Years since quitting: 41.8  . Smokeless tobacco: Never Used  Vaping Use  . Vaping Use: Never used  Substance Use Topics  . Alcohol use: No  . Drug use: No         OPHTHALMIC EXAM:  Base Eye Exam    Visual Acuity (Snellen - Linear)      Right Left   Dist cc 20/25 -1 20/200   Dist ph cc  NI   Correction: Glasses       Tonometry (Tonopen, 10:21 AM)      Right Left   Pressure 19 19       Pupils      Pupils Dark Light Shape React APD   Right PERRL 3 2 Round Slow None   Left PERRL 3 2 Round Sluggish None       Visual Fields (Counting fingers)      Left Right    Full Full       Neuro/Psych    Oriented x3: Yes   Mood/Affect: Normal       Dilation    Right eye: 1.0% Mydriacyl, 2.5% Phenylephrine @ 10:21 AM        Slit Lamp and Fundus Exam    External Exam      Right Left   External Normal Normal       Slit Lamp Exam      Right Left    Lids/Lashes Normal Normal   Conjunctiva/Sclera White and quiet White and quiet   Cornea Clear Clear   Anterior Chamber Deep and quiet Deep and quiet   Iris Round and reactive Round and reactive   Lens Posterior chamber intraocular lens Posterior chamber intraocular lens   Anterior Vitreous Normal Normal       Fundus Exam      Right Left   Posterior Vitreous Posterior vitreous detachment Normal   Disc Peripapillary atrophy    C/D Ratio 0.7    Macula Advanced age related macular degeneration, Atrophy, Retinal pigment epithelial atrophy, Disciform scar, [place], Mottling, Retinal pigment epithelial mottling, Retinal pigment epithelial atrophy - geographic    Vessels Normal, , no DR    Periphery Normal           IMAGING AND PROCEDURES  Imaging and Procedures for 07/31/20  OCT, Retina - OU - Both Eyes       Right Eye Quality was good. Scan locations included subfoveal. Central Foveal Thickness: 327. Progression has been stable. Findings include cystoid macular edema, pigment epithelial detachment, disciform scar, intraretinal fluid.   Left Eye Quality was borderline. Scan locations included subfoveal. Central Foveal Thickness: 274. Findings include disciform scar, central retinal atrophy, outer retinal atrophy, inner retinal atrophy.   Notes OD, chronically active yet stable, for over 16 years in this office   Intraretinal fluid OD superotemporally, stable over time due to mature subfoveal fibrotic CNVM.        Intravitreal Injection, Pharmacologic Agent - OD - Right Eye       Time Out 07/31/2020. 10:56 AM. Confirmed correct patient,  procedure, site, and patient consented.   Anesthesia Topical anesthesia was used. Anesthetic medications included Akten 3.5%.   Procedure Preparation included Tobramycin 0.3%, eyelid speculum, 10% betadine to eyelids, 5% betadine to ocular surface. A 30 gauge needle was used.   Injection:  0.5 mg ranibizumab (LUCENTIS) 0.5 MG/0.05ML  intravitreal injection   NDC: 50242-080-01, Lot: [id number]   Route: Intravitreal, Site: Right Eye, Waste: 0 mg  Post-op Post injection exam found visual acuity of at least counting fingers. The patient tolerated the procedure well. There were no complications. The patient received written and verbal post procedure care education.                 ASSESSMENT/PLAN:  Exudative age-related macular degeneration of right eye with active choroidal neovascularization (HCC) Exudative CNVM OD, mature fibrotic with intraretinal fluid superotemporally.  Stable overall.  We will repeat injection Lucentis today.    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 Exudative age-related macular degeneration of right eye with active choroidal neovascularization (HCC)  H35.3211 OCT, Retina - OU - Both Eyes    Intravitreal Injection, Pharmacologic Agent - OD - Right Eye    ranibizumab (LUCENTIS) 0.5 MG/0.05ML intravitreal injection 0.5 mg  2. Secondary glaucoma of right eye, mild stage  H40.51X1   3. 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 Exudative age-related macular degeneration of left eye with inactive scar ([place])  H35.3223     1.  Repeat intravitreal Lucentis OD today and examination repeat in 6 weeks  2.  3.  Ophthalmic Meds Ordered this visit:  Meds ordered this encounter  Medications  . ranibizumab (LUCENTIS) 0.5 MG/0.05ML intravitreal injection 0.5 mg       Return in about 6 weeks (around 09/11/2020) for dilate, OD, LUCENTIS 0.5 OCT.  Patient Instructions  Patient instructed to promptly report new onset visual acuity decline or  distortions    Explained the diagnoses, plan, and follow up with the patient and they expressed understanding.  Patient expressed understanding of the importance of proper follow up care.   Clent Demark Ishi Danser M.D. Diseases & Surgery of the Retina and Vitreous [facility] [place] 07/31/20     Abbreviations: M myopia (nearsighted); A astigmatism; H hyperopia (farsighted); P presbyopia; Mrx spectacle prescription;  CTL contact lenses; OD right eye; OS left eye; OU both eyes  XT exotropia; ET esotropia; PEK punctate epithelial keratitis; PEE punctate epithelial erosions; DES dry eye syndrome; MGD meibomian gland dysfunction; ATs artificial tears; PFAT's preservative free artificial tears; [place] nuclear sclerotic cataract; PSC posterior subcapsular cataract; ERM epi-retinal membrane; PVD posterior vitreous detachment; RD retinal detachment; DM diabetes mellitus; DR diabetic retinopathy; NPDR non-proliferative diabetic retinopathy; PDR proliferative diabetic retinopathy; CSME clinically significant macular edema; DME diabetic macular edema; dbh dot blot hemorrhages; CWS cotton wool spot; POAG primary open angle glaucoma; C/D cup-to-disc ratio; HVF humphrey visual field; GVF goldmann visual field; OCT optical coherence tomography; IOP intraocular pressure; BRVO Branch retinal vein occlusion; CRVO central retinal vein occlusion; CRAO central retinal artery occlusion; BRAO branch retinal artery occlusion; RT retinal tear; SB scleral buckle; PPV pars plana vitrectomy; VH Vitreous hemorrhage; PRP panretinal laser photocoagulation; IVK intravitreal kenalog;  VMT vitreomacular traction; MH Macular hole;  NVD neovascularization of the disc; NVE neovascularization elsewhere; AREDS age related eye disease study; ARMD age related macular degeneration; POAG primary open angle glaucoma; EBMD epithelial/anterior basement membrane dystrophy; ACIOL anterior chamber intraocular lens; IOL intraocular lens; PCIOL posterior  chamber intraocular lens; Phaco/IOL phacoemulsification with intraocular lens placement; [place] photorefractive keratectomy; LASIK laser assisted in situ keratomileusis; HTN hypertension; DM diabetes mellitus; COPD chronic obstructive pulmonary disease

## 2020-07-31 NOTE — Assessment & Plan Note (Signed)
Stable OU continue on topical medications

## 2020-07-31 NOTE — Assessment & Plan Note (Signed)
No active disease left eye

## 2020-07-31 NOTE — Patient Instructions (Signed)
Patient instructed to promptly report new onset visual acuity decline or distortions

## 2020-08-22 ENCOUNTER — Other Ambulatory Visit: Payer: Self-pay

## 2020-08-22 ENCOUNTER — Ambulatory Visit (INDEPENDENT_AMBULATORY_CARE_PROVIDER_SITE_OTHER): Payer: Medicare Other | Admitting: Pharmacist

## 2020-08-22 DIAGNOSIS — I4891 Unspecified atrial fibrillation: Secondary | ICD-10-CM

## 2020-08-22 DIAGNOSIS — Z953 Presence of xenogenic heart valve: Secondary | ICD-10-CM

## 2020-08-22 DIAGNOSIS — Z952 Presence of prosthetic heart valve: Secondary | ICD-10-CM | POA: Diagnosis not present

## 2020-08-22 DIAGNOSIS — Z5181 Encounter for therapeutic drug level monitoring: Secondary | ICD-10-CM | POA: Diagnosis not present

## 2020-08-22 LAB — POCT INR: INR: 2.7 (ref 2.0–3.0)

## 2020-08-22 NOTE — Patient Instructions (Signed)
Continue taking Warfarin 1 tablet daily except for 1.5 tablets on Sundays and Thursdays. Recheck INR in 6 weeks. Call  Coumadin Clinic [phone] with any concerns,  new medications or if scheduled for any procedures

## 2020-09-06 DIAGNOSIS — Z23 Encounter for immunization: Secondary | ICD-10-CM | POA: Diagnosis not present

## 2020-09-11 ENCOUNTER — Ambulatory Visit (INDEPENDENT_AMBULATORY_CARE_PROVIDER_SITE_OTHER): Payer: Medicare Other | Admitting: Ophthalmology

## 2020-09-11 ENCOUNTER — Other Ambulatory Visit: Payer: Self-pay

## 2020-09-11 ENCOUNTER — Encounter (INDEPENDENT_AMBULATORY_CARE_PROVIDER_SITE_OTHER): Payer: Self-pay | Admitting: Ophthalmology

## 2020-09-11 DIAGNOSIS — H353211 Exudative age-related macular degeneration, right eye, with active choroidal neovascularization: Secondary | ICD-10-CM | POA: Diagnosis not present

## 2020-09-11 DIAGNOSIS — H353223 Exudative age-related macular degeneration, left eye, with inactive scar: Secondary | ICD-10-CM

## 2020-09-11 MED ORDER — RANIBIZUMAB 0.5 MG/0.05ML IZ SOLN FOR KALEIDOSCOPE
0.5000 mg | INTRAVITREAL | Status: AC | PRN
  Administered 2020-09-11: .5 mg via INTRAVITREAL

## 2020-09-11 NOTE — Assessment & Plan Note (Signed)
OD, stable today again at 6-week follow-up interval for complex lesion anatomy and area of subretinal and intraretinal fluid superotemporal and inferotemporal to macula now again controlled on Lucentis

## 2020-09-11 NOTE — Progress Notes (Signed)
09/11/2020     CHIEF COMPLAINT Patient presents for Retina Follow Up   HISTORY OF PRESENT ILLNESS: Jason [name] is a 86 y.o. male who presents to the clinic today for:   HPI    Retina Follow Up    Patient presents with  Wet AMD.  In right eye.  This started 6 weeks ago.  Severity is mild.  Duration of 6 weeks.  Since onset it is stable.          Comments    6 WK F/U OD, POSS LUCENTIS OD   Pt reports stable vision OU, no new f/f, no pain or pressure.        Last edited by Nichola Sizer D on 09/11/2020 10:50 AM. (History)      Referring physician: Shirline Frees, MD [place],  [place] [address]  HISTORICAL INFORMATION:   Selected notes from the MEDICAL RECORD NUMBER    Lab Results  Component Value Date   HGBA1C 5.4 08/11/2013     CURRENT MEDICATIONS: Current Outpatient Medications (Ophthalmic Drugs)  Medication Sig  . ALPHAGAN P 0.1 % SOLN INSTILL 1 DROP IN BOTH EYES TWICE A DAY   No current facility-administered medications for this visit. (Ophthalmic Drugs)   Current Outpatient Medications (Other)  Medication Sig  . acetaminophen (TYLENOL) 650 MG CR tablet Take 650 mg by mouth 2 (two) times daily.   . Ascorbic Acid (VITAMIN C) 1000 MG tablet Take 1,000 mg by mouth daily.  Marland Kitchen atorvastatin (LIPITOR) 20 MG tablet Take 20 mg by mouth at bedtime.   Marland Kitchen b complex vitamins tablet Take 1 tablet by mouth daily.  . ciprofloxacin (CIPRO) 500 MG tablet Take 500 mg by mouth 2 (two) times daily.  . clindamycin (CLEOCIN) 300 MG capsule Take 2 capsules (600mg ) by mouth once 30-60 minutes prior to abdominal surgery.  Marland Kitchen diltiazem (CARDIZEM CD) 120 MG 24 hr capsule Take 1 capsule (120 mg total) by mouth daily.  . furosemide (LASIX) 40 MG tablet 40 mg in the am and 20 mg in the pm  . lisinopril (ZESTRIL) 10 MG tablet Take 10 mg by mouth daily.  . metoprolol succinate (TOPROL-XL) 25 MG 24 hr tablet Take 0.5 tablets (12.5 mg total) by mouth 2 (two)  times daily.  . Multiple Vitamins-Minerals (PRESERVISION AREDS PO) Take 1 tablet by mouth 2 (two) times daily.   . phenazopyridine (PYRIDIUM) 200 MG tablet Take 200 mg by mouth every 8 (eight) hours as needed.  . warfarin (COUMADIN) 5 MG tablet TAKE AS DIRECTED BY COUMADIN CLINIC   No current facility-administered medications for this visit. (Other)      REVIEW OF SYSTEMS:    ALLERGIES Allergies  Allergen Reactions  . Penicillins Rash and Other (See Comments)    Has patient had a PCN reaction causing immediate rash, facial/tongue/throat swelling, SOB or lightheadedness with hypotension: # # Yes # # Has patient had a PCN reaction causing severe rash involving mucus membranes or skin necrosis: No Has patient had a PCN reaction that required hospitalization: No Has patient had a PCN reaction occurring within the last 10 years: No If all of the above answers are "NO", then may proceed with Cephalosporin use.     PAST MEDICAL HISTORY Past Medical History:  Diagnosis Date  . AK (actinic keratosis)   . Atrial fibrillation, persistent (HCC)    chronic coumadin therapy  . Bifascicular block   . Blindness    left eye  .  CKD (chronic kidney disease), stage III ([place])   . Coronary artery disease    20% RCA by 2014 cath  . Elevated PSA   . GERD (gastroesophageal reflux disease)   . H/O hiatal hernia   . Heart murmur   . History of kidney stones   . Hydrocele    left  . Hyperlipidemia   . Hypertension   . Internal hemorrhoids   . Kidney stones    kidney stones  . Macular degeneration    right eye  . Mitral regurgitation 02/25/2011   Recurrent MR s/p mitral valve repair then bioprosthetic MVR 2014  . OA (osteoarthritis) of neck   . S/P aortic valve replacement with bioprosthetic valve 08/15/2013   70mm Edwards Magna Ease bovine pericardial tissue valve  . S/P mitral valve repair 09/15/2004   Complex valvuloplasty including artificial Goretex neocord placement x4 and 48mm SARP  ring annuloplasty via right mini thoracotomy approach - Dr Evelina Dun @ [facility]  . S/P redo mitral valve replacement and aortic valve replacement with bioprosthetic valves 08/15/2013   23mm Edwards Magna Mitral bovine pericardial tissue valve  . Severe aortic stenosis 11/14   tissue AVR   . Vocal cord paralysis    Past Surgical History:  Procedure Laterality Date  . AORTIC VALVE REPLACEMENT N/A 08/15/2013   Procedure: AORTIC VALVE REPLACEMENT (AVR);  Surgeon: Rexene Alberts, MD;  Location: [place];  Service: Open Heart Surgery;  Laterality: N/A;  . CATARACT EXTRACTION Bilateral 2011   Dr. Bing Plume  . ESOPHAGOGASTRODUODENOSCOPY N/A 08/02/2013   Procedure: ESOPHAGOGASTRODUODENOSCOPY (EGD);  Surgeon: Casandra Doffing, MD;  Location: [facility] ENDOSCOPY;  Service: Cardiovascular;  Laterality: N/A;  . EYE SURGERY Right    infection  . GROIN MASS OPEN BIOPSY  2012  . HEMORRHOID SURGERY     pt denies.  . INGUINAL HERNIA REPAIR  08/2011  . INTRAOPERATIVE TRANSESOPHAGEAL ECHOCARDIOGRAM N/A 08/15/2013   Procedure: INTRAOPERATIVE TRANSESOPHAGEAL ECHOCARDIOGRAM;  Surgeon: Rexene Alberts, MD;  Location: [place];  Service: Open Heart Surgery;  Laterality: N/A;  . LEFT AND RIGHT HEART CATHETERIZATION WITH CORONARY ANGIOGRAM N/A 07/05/2013   Procedure: LEFT AND RIGHT HEART CATHETERIZATION WITH CORONARY ANGIOGRAM;  Surgeon: Jettie Booze, MD;  Location: [facility] CATH LAB;  Service: Cardiovascular;  Laterality: N/A;  . MASS EXCISION Right 03/30/2018   Procedure: EXCISION RIGHT FLANK  MASS;  Surgeon: Coralie Keens, MD;  Location: [place];  Service: General;  Laterality: Right;  . MITRAL VALVE REPAIR  09/16/2011   @ [facility], Dr Evelina Dun  . MITRAL VALVE REPAIR N/A 08/15/2013   Procedure: REDO MITRAL VALVE (MV) REPLACEMENT;  Surgeon: Rexene Alberts, MD;  Location: [place] [place];  Service: Open Heart Surgery;  Laterality: N/A;  . TEE WITHOUT CARDIOVERSION N/A 07/05/2013   Procedure: TRANSESOPHAGEAL ECHOCARDIOGRAM (TEE);  Surgeon: Casandra Doffing, MD;   Location: [facility] ENDOSCOPY;  Service: Cardiovascular;  Laterality: N/A;  . TEE WITHOUT CARDIOVERSION N/A 08/02/2013   Procedure: TRANSESOPHAGEAL ECHOCARDIOGRAM (TEE);  Surgeon: Casandra Doffing, MD;  Location: [place];  Service: Cardiovascular;  Laterality: N/A;  . UMBILICAL HERNIA REPAIR N/A 03/30/2018   Procedure: HERNIA REPAIR UMBILICAL;  Surgeon: Coralie Keens, MD;  Location: [facility] OR;  Service: General;  Laterality: N/A;    FAMILY HISTORY Family History  Problem Relation Age of Onset  . Cancer Father        colon  . Hypertension Father   . Diabetes Father   . Hyperlipidemia Father   . Stroke Father   . Hypertension Mother   .  Diabetes Mother   . Hyperlipidemia Mother   . Stroke Mother   . Heart attack Mother   . Hypertension Brother        MI, S/P MVR    SOCIAL HISTORY Social History   Tobacco Use  . Smoking status: Former Smoker    Years: 8.00    Types: Cigarettes    Quit date: 10/12/1978    Years since quitting: 41.9  . Smokeless tobacco: Never Used  Vaping Use  . Vaping Use: Never used  Substance Use Topics  . Alcohol use: No  . Drug use: No         OPHTHALMIC EXAM: Base Eye Exam    Visual Acuity (ETDRS)      Right Left   Dist cc 20/25 20/200 -1   Dist ph cc  NI   Correction: Glasses       Tonometry (Tonopen, 10:56 AM)      Right Left   Pressure 20 20       Pupils      Dark Light Shape React APD   Right 3 2 Round Slow None   Left 3 2 Round Sluggish None       Visual Fields (Counting fingers)      Left Right     Full   Restrictions Partial outer inferior temporal, inferior nasal deficiencies        Extraocular Movement      Right Left    Full Full       Neuro/Psych    Oriented x3: Yes   Mood/Affect: Normal       Dilation    Right eye: 1.0% Mydriacyl, 2.5% Phenylephrine @ 10:56 AM        Slit Lamp and Fundus Exam    External Exam      Right Left   External Normal        Slit Lamp Exam      Right Left   Lids/Lashes Normal     Conjunctiva/Sclera White and quiet    Cornea Clear    Anterior Chamber Deep and quiet    Iris Round and reactive    Lens Posterior chamber intraocular lens    Anterior Vitreous Normal        Fundus Exam      Right Left   Posterior Vitreous Posterior vitreous detachment Normal   Disc Peripapillary atrophy    C/D Ratio 0.75    Macula Advanced age related macular degeneration, Atrophy, Retinal pigment epithelial atrophy, Disciform scar, [place], Mottling, Retinal pigment epithelial mottling, Retinal pigment epithelial atrophy - geographic, mature CNVM vessel temp to FAZ, stable    Vessels Normal, , no DR    Periphery Normal           IMAGING AND PROCEDURES  Imaging and Procedures for 09/11/20  OCT, Retina - OU - Both Eyes       Right Eye Quality was good. Scan locations included subfoveal. Central Foveal Thickness: 334. Progression has been stable. Findings include cystoid macular edema, pigment epithelial detachment, disciform scar, intraretinal fluid.   Left Eye Quality was good. Scan locations included subfoveal. Central Foveal Thickness: 331. Progression has been stable. Findings include disciform scar, central retinal atrophy, outer retinal atrophy, inner retinal atrophy.   Notes OD, chronically active yet stable, for over 16 years in this office   Intraretinal fluid OD superotemporally, stable over time due to mature subfoveal fibrotic CNVM.        Intravitreal Injection, Pharmacologic Agent -  OD - Right Eye       Time Out 09/11/2020. 11:16 AM. Confirmed correct patient, procedure, site, and patient consented.   Anesthesia Topical anesthesia was used. Anesthetic medications included Akten 3.5%.   Procedure Preparation included eyelid speculum, 10% betadine to eyelids, 5% betadine to ocular surface, Ofloxacin . A 30 gauge needle was used.   Injection:  0.5 mg ranibizumab (LUCENTIS) 0.5 MG/0.05ML intravitreal injection   NDC: 50242-080-01   Route:  Intravitreal, Site: Right Eye, Waste: 0 mg  Post-op Post injection exam found visual acuity of at least counting fingers. The patient tolerated the procedure well. There were no complications. The patient received written and verbal post procedure care education.                 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 Exudative age-related macular degeneration of right eye with active choroidal neovascularization (HCC)  H35.3211 OCT, Retina - OU - Both Eyes    Intravitreal Injection, Pharmacologic Agent - OD - Right Eye    ranibizumab (LUCENTIS) 0.5 MG/0.05ML intravitreal injection 0.5 mg  2. Exudative age-related macular degeneration of left eye with inactive scar ([place])  H35.3223     1.  Complex lesion anatomy of CNVM, fibrotic scar and mature trunk of an old CNVM with chronic intraretinal fluid temporally OD, stabilized and controlled on Lucentis intravitreal, currently at 6-week interval repeat injection today  2.  Repeat evaluation in 6 weeks  3.  Ophthalmic Meds Ordered this visit:  Meds ordered this encounter  Medications  . ranibizumab (LUCENTIS) 0.5 MG/0.05ML intravitreal injection 0.5 mg       Return in about 6 weeks (around 10/23/2020) for dilate, OD, LUCENTIS 0.5 OCT.  There are no Patient Instructions on file for this visit.   Explained the diagnoses, plan, and follow up with the patient and they expressed understanding.  Patient expressed understanding of the importance of proper follow up care.   Clent Demark Perkins Molina M.D. Diseases & Surgery of the  Retina and Vitreous Retina [facility] Diabetic [place] 09/11/20     Abbreviations: M myopia (nearsighted); A astigmatism; H hyperopia (farsighted); P presbyopia; Mrx spectacle prescription;  CTL contact lenses; OD right eye; OS left eye; OU both eyes  XT exotropia; ET esotropia; PEK punctate epithelial keratitis; PEE punctate epithelial erosions; DES dry eye syndrome; MGD meibomian gland dysfunction; ATs artificial tears; PFAT's preservative free artificial tears; [place] nuclear sclerotic cataract; PSC posterior subcapsular cataract; ERM epi-retinal membrane; PVD posterior vitreous detachment; RD retinal detachment; DM diabetes mellitus; DR diabetic retinopathy; NPDR non-proliferative diabetic retinopathy; PDR proliferative diabetic retinopathy; CSME clinically significant macular edema; DME diabetic macular edema; dbh dot blot hemorrhages; CWS cotton wool spot; POAG primary open angle glaucoma; C/D cup-to-disc ratio; HVF humphrey visual field; GVF goldmann visual field; OCT optical coherence tomography; IOP intraocular pressure; BRVO Branch retinal vein occlusion; CRVO central retinal vein occlusion; CRAO central retinal artery occlusion; BRAO branch retinal artery occlusion; RT retinal tear; SB scleral buckle; PPV pars plana vitrectomy; VH Vitreous hemorrhage; PRP panretinal laser photocoagulation; IVK intravitreal kenalog; VMT vitreomacular traction; MH Macular hole;  NVD neovascularization of the disc; NVE  neovascularization elsewhere; AREDS age related eye disease study; ARMD age related macular degeneration; POAG primary open angle glaucoma; EBMD epithelial/anterior basement membrane dystrophy; ACIOL anterior chamber intraocular lens; IOL intraocular lens; PCIOL posterior chamber intraocular lens; Phaco/IOL phacoemulsification with intraocular lens placement; [place] photorefractive keratectomy; LASIK laser assisted in situ keratomileusis; HTN hypertension; DM diabetes mellitus; COPD chronic obstructive pulmonary  disease

## 2020-09-11 NOTE — Assessment & Plan Note (Signed)
No active lesion edges by OCT

## 2020-09-30 DIAGNOSIS — M1990 Unspecified osteoarthritis, unspecified site: Secondary | ICD-10-CM | POA: Diagnosis not present

## 2020-09-30 DIAGNOSIS — I10 Essential (primary) hypertension: Secondary | ICD-10-CM | POA: Diagnosis not present

## 2020-09-30 DIAGNOSIS — I4891 Unspecified atrial fibrillation: Secondary | ICD-10-CM | POA: Diagnosis not present

## 2020-09-30 DIAGNOSIS — E782 Mixed hyperlipidemia: Secondary | ICD-10-CM | POA: Diagnosis not present

## 2020-10-03 ENCOUNTER — Ambulatory Visit (INDEPENDENT_AMBULATORY_CARE_PROVIDER_SITE_OTHER): Payer: Medicare Other | Admitting: *Deleted

## 2020-10-03 ENCOUNTER — Other Ambulatory Visit: Payer: Self-pay

## 2020-10-03 DIAGNOSIS — Z952 Presence of prosthetic heart valve: Secondary | ICD-10-CM | POA: Diagnosis not present

## 2020-10-03 DIAGNOSIS — Z5181 Encounter for therapeutic drug level monitoring: Secondary | ICD-10-CM

## 2020-10-03 DIAGNOSIS — Z953 Presence of xenogenic heart valve: Secondary | ICD-10-CM | POA: Diagnosis not present

## 2020-10-03 LAB — POCT INR: INR: 2.2 (ref 2.0–3.0)

## 2020-10-03 NOTE — Patient Instructions (Signed)
Description   Continue taking Warfarin 1 tablet daily except for 1.5 tablets on Sundays and Thursdays. Recheck INR in 7 weeks. Call  Coumadin Clinic [phone] with any concerns,  new medications or if scheduled for any procedures

## 2020-10-16 DIAGNOSIS — Z85828 Personal history of other malignant neoplasm of skin: Secondary | ICD-10-CM | POA: Diagnosis not present

## 2020-10-16 DIAGNOSIS — L570 Actinic keratosis: Secondary | ICD-10-CM | POA: Diagnosis not present

## 2020-10-16 DIAGNOSIS — L819 Disorder of pigmentation, unspecified: Secondary | ICD-10-CM | POA: Diagnosis not present

## 2020-10-16 DIAGNOSIS — L814 Other melanin hyperpigmentation: Secondary | ICD-10-CM | POA: Diagnosis not present

## 2020-10-16 DIAGNOSIS — D229 Melanocytic nevi, unspecified: Secondary | ICD-10-CM | POA: Diagnosis not present

## 2020-10-16 DIAGNOSIS — L905 Scar conditions and fibrosis of skin: Secondary | ICD-10-CM | POA: Diagnosis not present

## 2020-10-16 DIAGNOSIS — L821 Other seborrheic keratosis: Secondary | ICD-10-CM | POA: Diagnosis not present

## 2020-10-23 ENCOUNTER — Other Ambulatory Visit: Payer: Self-pay

## 2020-10-23 ENCOUNTER — Ambulatory Visit (INDEPENDENT_AMBULATORY_CARE_PROVIDER_SITE_OTHER): Payer: Medicare Other | Admitting: Ophthalmology

## 2020-10-23 ENCOUNTER — Encounter (INDEPENDENT_AMBULATORY_CARE_PROVIDER_SITE_OTHER): Payer: Self-pay | Admitting: Ophthalmology

## 2020-10-23 DIAGNOSIS — H353211 Exudative age-related macular degeneration, right eye, with active choroidal neovascularization: Secondary | ICD-10-CM | POA: Diagnosis not present

## 2020-10-23 MED ORDER — RANIBIZUMAB 0.5 MG/0.05ML IZ SOSY
0.5000 mg | PREFILLED_SYRINGE | INTRAVITREAL | Status: AC | PRN
  Administered 2020-10-23: .5 mg via INTRAVITREAL

## 2020-10-23 NOTE — Assessment & Plan Note (Signed)
The nature of wet macular degeneration was discussed with the patient.  Forms of therapy reviewed include the use of Anti-VEGF medications injected painlessly into the eye, as well as other possible treatment modalities, including thermal laser therapy. Fellow eye involvement and risks were discussed with the patient. Upon the finding of wet age related macular degeneration, treatment will be offered. The treatment regimen is on a treat as needed basis with the intent to treat if necessary and extend interval of exams when possible. On average 1 out of 6 patients do not need lifetime therapy. However, the risk of recurrent disease is high for a lifetime.  Initially monthly, then periodic, examinations and evaluations will determine whether the next treatment is required on the day of the examination.  OD, chronic active CN VM due to mature subfoveal vascular supply, with immature macroscopic perfusion peripherally.  In the subfoveal location.  Only stable on Lucentis.  Repeat today and examined again in 6 weeks

## 2020-10-23 NOTE — Progress Notes (Signed)
10/23/2020     CHIEF COMPLAINT Patient presents for Retina Follow Up (6 Week Wet AMD f\u OD. 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 OCT/Pt states no changes in vision. Denies FOL and floaters.)   HISTORY OF PRESENT ILLNESS: Jason [name] is a 87 y.o. male who presents to the clinic today for:   HPI    Retina Follow Up    Patient presents with  Wet AMD.  In right eye.  Severity is moderate.  Duration of 6 weeks.  Since onset it is stable.  I, the attending physician,  performed the HPI with the patient and updated documentation appropriately. Additional comments: 6 Week Wet AMD f\u OD. 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 OCT Pt states no changes in vision. Denies FOL and floaters.       Last edited by Tilda Franco on 10/23/2020 10:15 AM. (History)      Referring physician: Shirline Frees, MD [address] [place],  [place] [address]  HISTORICAL INFORMATION:   Selected notes from the MEDICAL RECORD NUMBER    Lab Results  Component Value Date   HGBA1C 5.4 08/11/2013     CURRENT MEDICATIONS: Current Outpatient Medications (Ophthalmic Drugs)  Medication Sig  . ALPHAGAN P 0.1 % SOLN INSTILL 1 DROP IN BOTH EYES TWICE A DAY   No current facility-administered medications for this visit. (Ophthalmic Drugs)   Current Outpatient Medications (Other)  Medication Sig  . acetaminophen (TYLENOL) 650 MG CR tablet Take 650 mg by mouth 2 (two) times daily.   . Ascorbic Acid (VITAMIN C) 1000 MG tablet Take 1,000 mg by mouth daily.  Marland Kitchen atorvastatin (LIPITOR) 20 MG tablet Take 20 mg by mouth at bedtime.   Marland Kitchen b complex vitamins tablet Take 1 tablet by mouth daily.  . ciprofloxacin (CIPRO) 500 MG tablet Take 500 mg by mouth 2 (two) times daily.  . clindamycin (CLEOCIN) 300 MG capsule Take 2 capsules (600mg ) by mouth once 30-60 minutes prior to abdominal surgery.  Marland Kitchen diltiazem (CARDIZEM CD) 120 MG 24 hr capsule Take 1 capsule (120 mg total) by mouth daily.  . furosemide (LASIX) 40 MG tablet 40 mg in the am  and 20 mg in the pm  . lisinopril (ZESTRIL) 10 MG tablet Take 10 mg by mouth daily.  . metoprolol succinate (TOPROL-XL) 25 MG 24 hr tablet Take 0.5 tablets (12.5 mg total) by mouth 2 (two) times daily.  . Multiple Vitamins-Minerals (PRESERVISION AREDS PO) Take 1 tablet by mouth 2 (two) times daily.   . phenazopyridine (PYRIDIUM) 200 MG tablet Take 200 mg by mouth every 8 (eight) hours as needed.  . warfarin (COUMADIN) 5 MG tablet TAKE AS DIRECTED BY COUMADIN CLINIC   No current facility-administered medications for this visit. (Other)      REVIEW OF SYSTEMS:    ALLERGIES Allergies  Allergen Reactions  . Penicillins Rash and Other (See Comments)    Has patient had a PCN reaction causing immediate rash, facial/tongue/throat swelling, SOB or lightheadedness with hypotension: # # Yes # # Has patient had a PCN reaction causing severe rash involving mucus membranes or skin necrosis: No Has patient had a PCN reaction that required hospitalization: No Has patient had a PCN reaction occurring within the last 10 years: No If all of the above answers are "NO", then may proceed with Cephalosporin use.     PAST MEDICAL HISTORY Past Medical History:  Diagnosis Date  . AK (actinic keratosis)   . Atrial fibrillation, persistent (HCC)    chronic  coumadin therapy  . Bifascicular block   . Blindness    left eye  . CKD (chronic kidney disease), stage III ([place])   . Coronary artery disease    20% RCA by 2014 cath  . Elevated PSA   . GERD (gastroesophageal reflux disease)   . H/O hiatal hernia   . Heart murmur   . History of kidney stones   . Hydrocele    left  . Hyperlipidemia   . Hypertension   . Internal hemorrhoids   . Kidney stones    kidney stones  . Macular degeneration    right eye  . Mitral regurgitation 02/25/2011   Recurrent MR s/p mitral valve repair then bioprosthetic MVR 2014  . OA (osteoarthritis) of neck   . S/P aortic valve replacement with bioprosthetic valve  08/15/2013   31mm Edwards Magna Ease bovine pericardial tissue valve  . S/P mitral valve repair 09/15/2004   Complex valvuloplasty including artificial Goretex neocord placement x4 and 45mm SARP ring annuloplasty via right mini thoracotomy approach - Dr Evelina Dun @ [facility]  . S/P redo mitral valve replacement and aortic valve replacement with bioprosthetic valves 08/15/2013   42mm Edwards Magna Mitral bovine pericardial tissue valve  . Severe aortic stenosis 11/14   tissue AVR   . Vocal cord paralysis    Past Surgical History:  Procedure Laterality Date  . AORTIC VALVE REPLACEMENT N/A 08/15/2013   Procedure: AORTIC VALVE REPLACEMENT (AVR);  Surgeon: Rexene Alberts, MD;  Location: [place];  Service: Open Heart Surgery;  Laterality: N/A;  . CATARACT EXTRACTION Bilateral 2011   Dr. Bing Plume  . ESOPHAGOGASTRODUODENOSCOPY N/A 08/02/2013   Procedure: ESOPHAGOGASTRODUODENOSCOPY (EGD);  Surgeon: Casandra Doffing, MD;  Location: [facility] ENDOSCOPY;  Service: Cardiovascular;  Laterality: N/A;  . EYE SURGERY Right    infection  . GROIN MASS OPEN BIOPSY  2012  . HEMORRHOID SURGERY     pt denies.  . INGUINAL HERNIA REPAIR  08/2011  . INTRAOPERATIVE TRANSESOPHAGEAL ECHOCARDIOGRAM N/A 08/15/2013   Procedure: INTRAOPERATIVE TRANSESOPHAGEAL ECHOCARDIOGRAM;  Surgeon: Rexene Alberts, MD;  Location: [place];  Service: Open Heart Surgery;  Laterality: N/A;  . LEFT AND RIGHT HEART CATHETERIZATION WITH CORONARY ANGIOGRAM N/A 07/05/2013   Procedure: LEFT AND RIGHT HEART CATHETERIZATION WITH CORONARY ANGIOGRAM;  Surgeon: Jettie Booze, MD;  Location: [facility] CATH LAB;  Service: Cardiovascular;  Laterality: N/A;  . MASS EXCISION Right 03/30/2018   Procedure: EXCISION RIGHT FLANK  MASS;  Surgeon: Coralie Keens, MD;  Location: [place];  Service: General;  Laterality: Right;  . MITRAL VALVE REPAIR  09/16/2011   @ [facility], Dr Evelina Dun  . MITRAL VALVE REPAIR N/A 08/15/2013   Procedure: REDO MITRAL VALVE (MV) REPLACEMENT;  Surgeon: Rexene Alberts, MD;  Location: [place];  Service: Open Heart Surgery;  Laterality: N/A;  . TEE WITHOUT CARDIOVERSION N/A 07/05/2013   Procedure: TRANSESOPHAGEAL ECHOCARDIOGRAM (TEE);  Surgeon: Casandra Doffing, MD;  Location: [facility] ENDOSCOPY;  Service: Cardiovascular;  Laterality: N/A;  . TEE WITHOUT CARDIOVERSION N/A 08/02/2013   Procedure: TRANSESOPHAGEAL ECHOCARDIOGRAM (TEE);  Surgeon: Casandra Doffing, MD;  Location: [place];  Service: Cardiovascular;  Laterality: N/A;  . UMBILICAL HERNIA REPAIR N/A 03/30/2018   Procedure: HERNIA REPAIR UMBILICAL;  Surgeon: Coralie Keens, MD;  Location: [facility] OR;  Service: General;  Laterality: N/A;    FAMILY HISTORY Family History  Problem Relation Age of Onset  . Cancer Father        colon  . Hypertension Father   . Diabetes Father   .  Hyperlipidemia Father   . Stroke Father   . Hypertension Mother   . Diabetes Mother   . Hyperlipidemia Mother   . Stroke Mother   . Heart attack Mother   . Hypertension Brother        MI, S/P MVR    SOCIAL HISTORY Social History   Tobacco Use  . Smoking status: Former Smoker    Years: 8.00    Types: Cigarettes    Quit date: 10/12/1978    Years since quitting: 42.0  . Smokeless tobacco: Never Used  Vaping Use  . Vaping Use: Never used  Substance Use Topics  . Alcohol use: No  . Drug use: No         OPHTHALMIC EXAM: Base Eye Exam    Visual Acuity (Snellen - Linear)      Right Left   Dist cc 20/30 -2 20/400   Dist ph cc  NI   Correction: Glasses       Tonometry (Tonopen, 10:20 AM)      Right Left   Pressure 21 18       Pupils      Pupils Dark Light Shape React APD   Right PERRL 3 2 Round Slow None   Left PERRL 3 2 Round Sluggish None       Visual Fields (Counting fingers)      Left Right    Full Full       Neuro/Psych    Oriented x3: Yes   Mood/Affect: Normal       Dilation    Right eye: 1.0% Mydriacyl, 2.5% Phenylephrine @ 10:20 AM        Slit Lamp and Fundus Exam    External Exam       Right Left   External Normal        Slit Lamp Exam      Right Left   Lids/Lashes Normal    Conjunctiva/Sclera White and quiet    Cornea Clear    Anterior Chamber Deep and quiet    Iris Round and reactive    Lens Posterior chamber intraocular lens    Anterior Vitreous Normal        Fundus Exam      Right Left   Posterior Vitreous Posterior vitreous detachment Normal   Disc Peripapillary atrophy    C/D Ratio 0.75    Macula Advanced age related macular degeneration, Atrophy, Retinal pigment epithelial atrophy, Disciform scar, [place], Mottling, Retinal pigment epithelial mottling, Retinal pigment epithelial atrophy - geographic, mature CNVM vessel temp to FAZ, stable    Vessels Normal, , no DR    Periphery Normal           IMAGING AND PROCEDURES  Imaging and Procedures for 10/23/20  OCT, Retina - OU - Both Eyes       Right Eye Quality was borderline. Scan locations included subfoveal. Progression has been stable. Findings include cystoid macular edema, pigment epithelial detachment, disciform scar, intraretinal fluid.   Left Eye Quality was good. Scan locations included subfoveal. Progression has been stable. Findings include disciform scar, central retinal atrophy, outer retinal atrophy, inner retinal atrophy.   Notes OD, chronically active yet stable, for over 16 years in this office   Intraretinal fluid OD superotemporally, stable over time due to mature subfoveal fibrotic CNVM.        Intravitreal Injection, Pharmacologic Agent - OD - Right Eye       Time Out 10/23/2020. 11:19 AM. Confirmed correct patient, procedure,  site, and patient consented.   Anesthesia Topical anesthesia was used. Anesthetic medications included Akten 3.5%.   Procedure Preparation included Ofloxacin . A 30 gauge needle was used.   Injection:  0.5 mg Ranibizumab SOSY (LUCENTIS) 0.5mg /0.81ml prefilled syr   NDC: [id number], Lot: [id number]   Route: Intravitreal, Site: Right Eye,  Waste: 0 mg                 ASSESSMENT/PLAN:  Exudative age-related macular degeneration of right eye with active choroidal neovascularization ([place]) The nature of wet macular degeneration was discussed with the patient.  Forms of therapy reviewed include the use of Anti-VEGF medications injected painlessly into the eye, as well as other possible treatment modalities, including thermal laser therapy. Fellow eye involvement and risks were discussed with the patient. Upon the finding of wet age related macular degeneration, treatment will be offered. The treatment regimen is on a treat as needed basis with the intent to treat if necessary and extend interval of exams when possible. On average 1 out of 6 patients do not need lifetime therapy. However, the risk of recurrent disease is high for a lifetime.  Initially monthly, then periodic, examinations and evaluations will determine whether the next treatment is required on the day of the examination.  OD, chronic active CN VM due to mature subfoveal vascular supply, with immature macroscopic perfusion peripherally.  In the subfoveal location.  Only stable on Lucentis.  Repeat today and examined again in 6 weeks      ICD-10-CM   1. Exudative age-related macular degeneration of right eye with active choroidal neovascularization (HCC)  H35.3211 OCT, Retina - OU - Both Eyes    Intravitreal Injection, Pharmacologic Agent - OD - Right Eye    Ranibizumab SOSY 0.5 mg    1.  Chronic active disease, stabilize and recurrent only on every 6 week examinations, Lucentis.  No signs of progression.  No atrophy noted macular.  2.  Treatment duration thus far is approaching 16 years.  3.  Ophthalmic Meds Ordered this visit:  Meds ordered this encounter  Medications  . Ranibizumab SOSY 0.5 mg       Return in about 6 weeks (around 12/04/2020) for dilate, OD, LUCENTIS 0.5 OCT.  There are no Patient Instructions on file for this visit.   Explained  the diagnoses, plan, and follow up with the patient and they expressed understanding.  Patient expressed understanding of the importance of proper follow up care.   Clent Demark Garmon Dehn M.D. Diseases & Surgery of the Retina and Vitreous Retina [facility] Diabetic [place] 10/23/20     Abbreviations: M myopia (nearsighted); A astigmatism; H hyperopia (farsighted); P presbyopia; Mrx spectacle prescription;  CTL contact lenses; OD right eye; OS left eye; OU both eyes  XT exotropia; ET esotropia; PEK punctate epithelial keratitis; PEE punctate epithelial erosions; DES dry eye syndrome; MGD meibomian gland dysfunction; ATs artificial tears; PFAT's preservative free artificial tears; [place] nuclear sclerotic cataract; PSC posterior subcapsular cataract; ERM epi-retinal membrane; PVD posterior vitreous detachment; RD retinal detachment; DM diabetes mellitus; DR diabetic retinopathy; NPDR non-proliferative diabetic retinopathy; PDR proliferative diabetic retinopathy; CSME clinically significant macular edema; DME diabetic macular edema; dbh dot blot hemorrhages; CWS cotton wool spot; POAG primary open angle glaucoma; C/D cup-to-disc ratio; HVF humphrey visual field; GVF goldmann visual field; OCT optical coherence tomography; IOP intraocular pressure; BRVO Branch retinal vein occlusion; CRVO central retinal vein occlusion; CRAO central retinal artery occlusion; BRAO branch retinal artery occlusion; RT retinal tear; SB scleral buckle;  PPV pars plana vitrectomy; VH Vitreous hemorrhage; PRP panretinal laser photocoagulation; IVK intravitreal kenalog; VMT vitreomacular traction; MH Macular hole;  NVD neovascularization of the disc; NVE neovascularization elsewhere; AREDS age related eye disease study; ARMD age related macular degeneration; POAG primary open angle glaucoma; EBMD epithelial/anterior basement membrane dystrophy; ACIOL anterior chamber intraocular lens; IOL intraocular lens; PCIOL posterior chamber intraocular lens;  Phaco/IOL phacoemulsification with intraocular lens placement; [place] photorefractive keratectomy; LASIK laser assisted in situ keratomileusis; HTN hypertension; DM diabetes mellitus; COPD chronic obstructive pulmonary disease

## 2020-11-06 ENCOUNTER — Other Ambulatory Visit: Payer: Self-pay | Admitting: Interventional Cardiology

## 2020-11-14 ENCOUNTER — Other Ambulatory Visit: Payer: Self-pay | Admitting: Internal Medicine

## 2020-11-21 ENCOUNTER — Other Ambulatory Visit: Payer: Self-pay

## 2020-11-21 ENCOUNTER — Ambulatory Visit (INDEPENDENT_AMBULATORY_CARE_PROVIDER_SITE_OTHER): Payer: Medicare Other | Admitting: *Deleted

## 2020-11-21 DIAGNOSIS — I4891 Unspecified atrial fibrillation: Secondary | ICD-10-CM

## 2020-11-21 DIAGNOSIS — Z5181 Encounter for therapeutic drug level monitoring: Secondary | ICD-10-CM | POA: Diagnosis not present

## 2020-11-21 DIAGNOSIS — Z952 Presence of prosthetic heart valve: Secondary | ICD-10-CM | POA: Diagnosis not present

## 2020-11-21 DIAGNOSIS — Z953 Presence of xenogenic heart valve: Secondary | ICD-10-CM

## 2020-11-21 LAB — POCT INR: INR: 2.8 (ref 2.0–3.0)

## 2020-11-21 NOTE — Patient Instructions (Signed)
Description   Continue taking Warfarin 1 tablet daily except for 1.5 tablets on Sundays and Thursdays. Recheck INR in 8 weeks. Call  C[facility]in [facility] #[phone] with any concerns,  new medications or if scheduled for any procedures    

## 2020-11-30 ENCOUNTER — Other Ambulatory Visit: Payer: Self-pay | Admitting: Interventional Cardiology

## 2020-12-03 DIAGNOSIS — I10 Essential (primary) hypertension: Secondary | ICD-10-CM | POA: Diagnosis not present

## 2020-12-03 DIAGNOSIS — E782 Mixed hyperlipidemia: Secondary | ICD-10-CM | POA: Diagnosis not present

## 2020-12-03 DIAGNOSIS — I4891 Unspecified atrial fibrillation: Secondary | ICD-10-CM | POA: Diagnosis not present

## 2020-12-03 DIAGNOSIS — N1831 Chronic kidney disease, stage 3a: Secondary | ICD-10-CM | POA: Diagnosis not present

## 2020-12-04 ENCOUNTER — Other Ambulatory Visit: Payer: Self-pay

## 2020-12-04 ENCOUNTER — Ambulatory Visit (INDEPENDENT_AMBULATORY_CARE_PROVIDER_SITE_OTHER): Payer: Medicare Other | Admitting: Ophthalmology

## 2020-12-04 ENCOUNTER — Encounter (INDEPENDENT_AMBULATORY_CARE_PROVIDER_SITE_OTHER): Payer: Self-pay | Admitting: Ophthalmology

## 2020-12-04 DIAGNOSIS — H353211 Exudative age-related macular degeneration, right eye, with active choroidal neovascularization: Secondary | ICD-10-CM | POA: Diagnosis not present

## 2020-12-04 DIAGNOSIS — E119 Type 2 diabetes mellitus without complications: Secondary | ICD-10-CM

## 2020-12-04 MED ORDER — RANIBIZUMAB 0.5 MG/0.05ML IZ SOSY
0.5000 mg | PREFILLED_SYRINGE | INTRAVITREAL | Status: AC | PRN
  Administered 2020-12-04: .5 mg via INTRAVITREAL

## 2020-12-04 NOTE — Progress Notes (Signed)
12/04/2020     CHIEF COMPLAINT Patient presents for Retina Follow Up (6 Week F/U OD, poss Luc OD//Pt denies noticeable changes to [place] OU since last visit. Pt denies ocular pain, flashes of light, or floaters OU. //)   HISTORY OF PRESENT ILLNESS: Jason [name] is a 87 y.o. male who presents to the clinic today for:   HPI    Retina Follow Up    Patient presents with  Wet AMD.  In right eye.  This started 6 weeks ago.  Severity is mild.  Duration of 6 weeks.  Since onset it is stable. Additional comments: 6 Week F/U OD, poss Luc OD  Pt denies noticeable changes to [place] OU since last visit. Pt denies ocular pain, flashes of light, or floaters OU.          Last edited by [name], [place] on 12/04/2020 10:10 AM. (History)      Referring physician: Shirline Frees, MD [place],  [place] [address]  HISTORICAL INFORMATION:   Selected notes from the MEDICAL RECORD NUMBER    Lab Results  Component Value Date   HGBA1C 5.4 08/11/2013     CURRENT MEDICATIONS: Current Outpatient Medications (Ophthalmic Drugs)  Medication Sig  . ALPHAGAN P 0.1 % SOLN INSTILL 1 DROP IN BOTH EYES TWICE A DAY   No current facility-administered medications for this visit. (Ophthalmic Drugs)   Current Outpatient Medications (Other)  Medication Sig  . acetaminophen (TYLENOL) 650 MG CR tablet Take 650 mg by mouth 2 (two) times daily.   . Ascorbic Acid (VITAMIN C) 1000 MG tablet Take 1,000 mg by mouth daily.  Marland Kitchen atorvastatin (LIPITOR) 20 MG tablet Take 20 mg by mouth at bedtime.   Marland Kitchen b complex vitamins tablet Take 1 tablet by mouth daily.  . ciprofloxacin (CIPRO) 500 MG tablet Take 500 mg by mouth 2 (two) times daily.  . clindamycin (CLEOCIN) 300 MG capsule Take 2 capsules (600mg ) by mouth once 30-60 minutes prior to abdominal surgery.  Marland Kitchen diltiazem (CARDIZEM CD) 120 MG 24 hr capsule Take 1 capsule (120 mg total) by mouth daily. Please keep upcoming appt in March 2022 with Dr.  Irish Lack before anymore refills. Thank you  . furosemide (LASIX) 40 MG tablet 40 mg in the am and 20 mg in the pm  . lisinopril (ZESTRIL) 10 MG tablet Take 10 mg by mouth daily.  . metoprolol succinate (TOPROL-XL) 25 MG 24 hr tablet Take 0.5 tablets (12.5 mg total) by mouth in the morning and at bedtime.  . Multiple Vitamins-Minerals (PRESERVISION AREDS PO) Take 1 tablet by mouth 2 (two) times daily.   . phenazopyridine (PYRIDIUM) 200 MG tablet Take 200 mg by mouth every 8 (eight) hours as needed.  . warfarin (COUMADIN) 5 MG tablet TAKE AS DIRECTED BY COUMADIN CLINIC   No current facility-administered medications for this visit. (Other)      REVIEW OF SYSTEMS:    ALLERGIES Allergies  Allergen Reactions  . Penicillins Rash and Other (See Comments)    Has patient had a PCN reaction causing immediate rash, facial/tongue/throat swelling, SOB or lightheadedness with hypotension: # # Yes # # Has patient had a PCN reaction causing severe rash involving mucus membranes or skin necrosis: No Has patient had a PCN reaction that required hospitalization: No Has patient had a PCN reaction occurring within the last 10 years: No If all of the above answers are "NO", then may proceed with Cephalosporin use.  PAST MEDICAL HISTORY Past Medical History:  Diagnosis Date  . AK (actinic keratosis)   . Atrial fibrillation, persistent (HCC)    chronic coumadin therapy  . Bifascicular block   . Blindness    left eye  . CKD (chronic kidney disease), stage III ([place])   . Coronary artery disease    20% RCA by 2014 cath  . Elevated PSA   . GERD (gastroesophageal reflux disease)   . H/O hiatal hernia   . Heart murmur   . History of kidney stones   . Hydrocele    left  . Hyperlipidemia   . Hypertension   . Internal hemorrhoids   . Kidney stones    kidney stones  . Macular degeneration    right eye  . Mitral regurgitation 02/25/2011   Recurrent MR s/p mitral valve repair then bioprosthetic  MVR 2014  . OA (osteoarthritis) of neck   . S/P aortic valve replacement with bioprosthetic valve 08/15/2013   75mm Edwards Magna Ease bovine pericardial tissue valve  . S/P mitral valve repair 09/15/2004   Complex valvuloplasty including artificial Goretex neocord placement x4 and 22mm SARP ring annuloplasty via right mini thoracotomy approach - Dr Evelina Dun @ [facility]  . S/P redo mitral valve replacement and aortic valve replacement with bioprosthetic valves 08/15/2013   53mm Edwards Magna Mitral bovine pericardial tissue valve  . Severe aortic stenosis 11/14   tissue AVR   . Vocal cord paralysis    Past Surgical History:  Procedure Laterality Date  . AORTIC VALVE REPLACEMENT N/A 08/15/2013   Procedure: AORTIC VALVE REPLACEMENT (AVR);  Surgeon: Rexene Alberts, MD;  Location: [place];  Service: Open Heart Surgery;  Laterality: N/A;  . CATARACT EXTRACTION Bilateral 2011   Dr. Bing Plume  . ESOPHAGOGASTRODUODENOSCOPY N/A 08/02/2013   Procedure: ESOPHAGOGASTRODUODENOSCOPY (EGD);  Surgeon: Casandra Doffing, MD;  Location[facility] ENDOSCOPY;  Service: Cardiovascular;  Laterality: N/A;  . EYE SURGERY Right    infection  . GROIN MASS OPEN BIOPSY  2012  . HEMORRHOID SURGERY     pt denies.  . INGUINAL HERNIA REPAIR  08/2011  . INTRAOPERATIVE TRANSESOPHAGEAL ECHOCARDIOGRAM N/A 08/15/2013   Procedure: INTRAOPERATIVE TRANSESOPHAGEAL ECHOCARDIOGRAM;  Surgeon: Rexene Alberts, MD;  Location: [place];  Service: Open Heart Surgery;  Laterality: N/A;  . LEFT AND RIGHT HEART CATHETERIZATION WITH CORONARY ANGIOGRAM N/A 07/05/2013   Procedure: LEFT AND RIGHT HEART CATHETERIZATION WITH CORONARY ANGIOGRAM;  Surgeon: Jettie Booze, MD;  Location: [facility] C[facility] LAB;  Service: Cardiovascular;  Laterality: N/A;  . MASS EXCISION Right 03/30/2018   Procedure: EXCISION RIGHT FLANK  MASS;  Surgeon: Coralie Keens, MD;  Location: [place];  Service: General;  Laterality: Right;  . MITRAL VALVE REPAIR  09/16/2011   @ [facility], Dr Evelina Dun  . MITRAL  VALVE REPAIR N/A 08/15/2013   Procedure: REDO MITRAL VALVE (MV) REPLACEMENT;  Surgeon: Rexene Alberts, MD;  Location: [place];  Service: Open Heart Surgery;  Laterality: N/A;  . TEE WITHOUT CARDIOVERSION N/A 07/05/2013   Procedure: TRANSESOPHAGEAL ECHOCARDIOGRAM (TEE);  Surgeon: Casandra Doffing, MD;  Location: [facility] ENDOSCOPY;  Service: Cardiovascular;  Laterality: N/A;  . TEE WITHOUT CARDIOVERSION N/A 08/02/2013   Procedure: TRANSESOPHAGEAL ECHOCARDIOGRAM (TEE);  Surgeon: Casandra Doffing, MD;  Location: [place];  Service: Cardiovascular;  Laterality: N/A;  . UMBILICAL HERNIA REPAIR N/A 03/30/2018   Procedure: HERNIA REPAIR UMBILICAL;  Surgeon: Coralie Keens, MD;  Location: [place];  Service: General;  Laterality: N/A;    FAMILY HISTORY Family History  Problem  Relation Age of Onset  . Cancer Father        colon  . Hypertension Father   . Diabetes Father   . Hyperlipidemia Father   . Stroke Father   . Hypertension Mother   . Diabetes Mother   . Hyperlipidemia Mother   . Stroke Mother   . Heart attack Mother   . Hypertension Brother        MI, S/P MVR    SOCIAL HISTORY Social History   Tobacco Use  . Smoking status: Former Smoker    Years: 8.00    Types: Cigarettes    Quit date: 10/12/1978    Years since quitting: 42.1  . Smokeless tobacco: Never Used  Vaping Use  . Vaping Use: Never used  Substance Use Topics  . Alcohol use: No  . Drug use: No         OPHTHALMIC EXAM:  Base Eye Exam    Visual Acuity (ETDRS)      Right Left   Dist cc 20/25 20/400   Dist ph cc  NI   Correction: Glasses       Tonometry (Tonopen, 10:10 AM)      Right Left   Pressure 21 19       Pupils      Pupils Dark Light Shape React APD   Right PERRL 3 2 Round Slow None   Left PERRL 3 2 Round Slow None       Visual Fields (Counting fingers)      Left Right    Full Full       Extraocular Movement      Right Left    Full Full       Neuro/Psych    Oriented x3: Yes   Mood/Affect:  Normal       Dilation    Right eye: 1.0% Mydriacyl, 2.5% Phenylephrine @ 10:13 AM        Slit Lamp and Fundus Exam    External Exam      Right Left   External Normal        Slit Lamp Exam      Right Left   Lids/Lashes Normal    Conjunctiva/Sclera White and quiet    Cornea Clear    Anterior Chamber Deep and quiet    Iris Round and reactive    Lens Posterior chamber intraocular lens    Anterior Vitreous Normal        Fundus Exam      Right Left   Posterior Vitreous Posterior vitreous detachment Normal   Disc Peripapillary atrophy    C/D Ratio 0.75    Macula Advanced age related macular degeneration, Atrophy, Retinal pigment epithelial atrophy, Disciform scar, [place], Mottling, Retinal pigment epithelial mottling, Retinal pigment epithelial atrophy - geographic, mature CNVM, now with subretinal hemorrhage ringing the fovea    Vessels Normal, , no DR    Periphery Normal           IMAGING AND PROCEDURES  Imaging and Procedures for 12/04/20  OCT, Retina - OU - Both Eyes       Right Eye Quality was borderline. Scan locations included subfoveal. Central Foveal Thickness: 335. Progression has been stable. Findings include cystoid macular edema, pigment epithelial detachment, disciform scar, intraretinal fluid, subretinal fluid.   Left Eye Quality was good. Scan locations included subfoveal. Progression has been stable. Findings include disciform scar, central retinal atrophy, outer retinal atrophy, inner retinal atrophy.   Notes OD, chronically active yet  stable, for over 16 years in this office   Intraretinal fluid OD superotemporally, stable over time due to mature subfoveal fibrotic CNVM.  Subretinal hyper reflective material appears to be correlating with subretinal hemorrhage        Intravitreal Injection, Pharmacologic Agent - OD - Right Eye       Time Out 12/04/2020. 11:24 AM. Confirmed correct patient, procedure, site, and patient consented.    Anesthesia Topical anesthesia was used. Anesthetic medications included [place] 3.5%.   Procedure Preparation included Ofloxacin . A 30 gauge needle was used.   Injection:  0.5 mg Ranibizumab SOSY (LUCENTIS) 0.5mg /0.7ml prefilled syr   NDC: [id number], Lot: [id number]   Route: Intravitreal, Site: Right Eye, Waste: 0 mg  Post-op Post injection exam found visual acuity of at least counting fingers. The patient tolerated the procedure well. There were no complications. The patient received written and verbal post procedure care education. Post injection medications were not given.                 ASSESSMENT/PLAN:  Exudative age-related macular degeneration of right eye with active choroidal neovascularization (HCC) New subretinal hemorrhage ringing in the fovea, yet with preserved visual acuity.  Repeat intravitreal Lucentis today and reexamine in 5 weeks  Diabetes mellitus without complication ([place]) The patient has diabetes without any evidence of retinopathy. The patient advised to maintain good blood glucose control, excellent blood pressure control, and favorable levels of cholesterol, low density lipoprotein, and high density lipoproteins. Follow up in 1 year was recommended. Explained that fluctuations in visual acuity , or "out of focus", may result from large variations of blood sugar control.      ICD-10-CM   1. Exudative age-related macular degeneration of right eye with active choroidal neovascularization (HCC)  H35.3211 OCT, Retina - OU - Both Eyes    Intravitreal Injection, Pharmacologic Agent - OD - Right Eye    Ranibizumab SOSY 0.5 mg  2. Diabetes mellitus without complication ([place])  Q73.4     1.  We will repeat intravitreal Lucentis today and examination again in 5 weeks, dilate OU  2.  Explained the new findings to Mr. Sinnett  3.  Ophthalmic Meds Ordered this visit:  Meds ordered this encounter  Medications  . Ranibizumab SOSY 0.5 mg       Return in  about 5 weeks (around 01/08/2021) for DILATE OU, LUCENTIS 0.5 OCT, OD.  There are no Patient Instructions on file for this visit.   Explained the diagnoses, plan, and follow up with the patient and they expressed understanding.  Patient expressed understanding of the importance of proper follow up care.   Clent Demark Rankin M.D. Diseases & Surgery of the Retina and Vitreous Retina & Diabetic [place] 12/04/20     Abbreviations: M myopia (nearsighted); A astigmatism; H hyperopia (farsighted); P presbyopia; Mrx spectacle prescription;  CTL contact lenses; OD right eye; OS left eye; OU both eyes  XT exotropia; ET esotropia; PEK punctate epithelial keratitis; PEE punctate epithelial erosions; DES dry eye syndrome; MGD meibomian gland dysfunction; ATs artificial tears; PFAT's preservative free artificial tears; [place] nuclear sclerotic cataract; PSC posterior subcapsular cataract; ERM epi-retinal membrane; PVD posterior vitreous detachment; RD retinal detachment; DM diabetes mellitus; DR diabetic retinopathy; NPDR non-proliferative diabetic retinopathy; PDR proliferative diabetic retinopathy; CSME clinically significant macular edema; DME diabetic macular edema; dbh dot blot hemorrhages; CWS cotton wool spot; POAG primary open angle glaucoma; C/D cup-to-disc ratio; HVF humphrey visual field; GVF goldmann visual field; OCT optical coherence tomography;  IOP intraocular pressure; BRVO Branch retinal vein occlusion; CRVO central retinal vein occlusion; CRAO central retinal artery occlusion; BRAO branch retinal artery occlusion; RT retinal tear; SB scleral buckle; PPV pars plana vitrectomy; VH Vitreous hemorrhage; PRP panretinal laser photocoagulation; IVK intravitreal kenalog; VMT vitreomacular traction; MH Macular hole;  NVD neovascularization of the disc; NVE neovascularization elsewhere; AREDS age related eye disease study; ARMD age related macular degeneration; POAG primary open angle glaucoma; EBMD  epithelial/anterior basement membrane dystrophy; ACIOL anterior chamber intraocular lens; IOL intraocular lens; PCIOL posterior chamber intraocular lens; Phaco/IOL phacoemulsification with intraocular lens placement; [place] photorefractive keratectomy; LASIK laser assisted in situ keratomileusis; HTN hypertension; DM diabetes mellitus; COPD chronic obstructive pulmonary disease

## 2020-12-04 NOTE — Assessment & Plan Note (Signed)
New subretinal hemorrhage ringing in the fovea, yet with preserved visual acuity.  Repeat intravitreal Lucentis today and reexamine in 5 weeks

## 2020-12-04 NOTE — Assessment & Plan Note (Signed)

## 2020-12-15 NOTE — Progress Notes (Unsigned)
Cardiology Office Note   Date:  12/16/2020   ID:  Jason [name], DOB 06/24/1932, MRN [id number]  PCP:  Shirline Frees, MD    No chief complaint on file.  S/p AVR, MVR  Wt Readings from Last 3 Encounters:  12/16/20 169 lb 9.6 oz (76.9 kg)  11/29/19 174 lb (78.9 kg)  11/17/19 173 lb 12.8 oz (78.8 kg)       History of Present Illness: Jason [name] is a 87 y.o. male   who has had valvular heart disease and atrial fibrillation. He underwent aortic valve and mitral valve replacement in 11/14. He was initially supposed to have a Maze procedure along with his valve surgery. Due to scar tissue from his initial surgery 9 years ago, he did not have a maze done. He has been rate controlledI the past, and anticoagulated.   He was told his kidney function was abnormalin 2016.   He has intentionally lost weightin 2016through portion control and exercise.  He had a lipoma removed and a hernia repair in 2019. No cardiac issues.   In 2021, "He has had some ringing in the ears, and headache; 2 episodes.  Monitor was prescribed to detect arrhythmia in those situations. "  He has avoided COVID.    Wants to go back to playing golf.  Denies : Chest pain. Dizziness. Leg edema. Nitroglycerin use. Orthopnea. Palpitations. Paroxysmal nocturnal dyspnea. Shortness of breath. Syncope.    Past Medical History:  Diagnosis Date  . AK (actinic keratosis)   . Atrial fibrillation, persistent (HCC)    chronic coumadin therapy  . Bifascicular block   . Blindness    left eye  . CKD (chronic kidney disease), stage III ([place])   . Coronary artery disease    20% RCA by 2014 cath  . Elevated PSA   . GERD (gastroesophageal reflux disease)   . H/O hiatal hernia   . Heart murmur   . History of kidney stones   . Hydrocele    left  . Hyperlipidemia   . Hypertension   . Internal hemorrhoids   . Kidney stones    kidney stones  . Macular degeneration    right eye  . Mitral  regurgitation 02/25/2011   Recurrent MR s/p mitral valve repair then bioprosthetic MVR 2014  . OA (osteoarthritis) of neck   . S/P aortic valve replacement with bioprosthetic valve 08/15/2013   72mm Edwards Magna Ease bovine pericardial tissue valve  . S/P mitral valve repair 09/15/2004   Complex valvuloplasty including artificial Goretex neocord placement x4 and 57mm SARP ring annuloplasty via right mini thoracotomy approach - Dr Evelina Dun @ [facility]  . S/P redo mitral valve replacement and aortic valve replacement with bioprosthetic valves 08/15/2013   25mm Edwards Magna Mitral bovine pericardial tissue valve  . Severe aortic stenosis 11/14   tissue AVR   . Vocal cord paralysis     Past Surgical History:  Procedure Laterality Date  . AORTIC VALVE REPLACEMENT N/A 08/15/2013   Procedure: AORTIC VALVE REPLACEMENT (AVR);  Surgeon: Rexene Alberts, MD;  Location: [place];  Service: Open Heart Surgery;  Laterality: N/A;  . CATARACT EXTRACTION Bilateral 2011   Dr. Bing Plume  . ESOPHAGOGASTRODUODENOSCOPY N/A 08/02/2013   Procedure: ESOPHAGOGASTRODUODENOSCOPY (EGD);  Surgeon: Casandra Doffing, MD;  Location: [facility] ENDOSCOPY;  Service: Cardiovascular;  Laterality: N/A;  . EYE SURGERY Right    infection  . GROIN MASS OPEN BIOPSY  2012  . HEMORRHOID SURGERY     pt  denies.  . INGUINAL HERNIA REPAIR  08/2011  . INTRAOPERATIVE TRANSESOPHAGEAL ECHOCARDIOGRAM N/A 08/15/2013   Procedure: INTRAOPERATIVE TRANSESOPHAGEAL ECHOCARDIOGRAM;  Surgeon: Rexene Alberts, MD;  Location: [place];  Service: Open Heart Surgery;  Laterality: N/A;  . LEFT AND RIGHT HEART CATHETERIZATION WITH CORONARY ANGIOGRAM N/A 07/05/2013   Procedure: LEFT AND RIGHT HEART CATHETERIZATION WITH CORONARY ANGIOGRAM;  Surgeon: Jettie Booze, MD;  Location: [facility] CATH LAB;  Service: Cardiovascular;  Laterality: N/A;  . MASS EXCISION Right 03/30/2018   Procedure: EXCISION RIGHT FLANK  MASS;  Surgeon: Coralie Keens, MD;  Location: [place];  Service: General;   Laterality: Right;  . MITRAL VALVE REPAIR  09/16/2011   @ [facility], Dr Evelina Dun  . MITRAL VALVE REPAIR N/A 08/15/2013   Procedure: REDO MITRAL VALVE (MV) REPLACEMENT;  Surgeon: Rexene Alberts, MD;  Location: [place];  Service: Open Heart Surgery;  Laterality: N/A;  . TEE WITHOUT CARDIOVERSION N/A 07/05/2013   Procedure: TRANSESOPHAGEAL ECHOCARDIOGRAM (TEE);  Surgeon: Casandra Doffing, MD;  Location: [facility] ENDOSCOPY;  Service: Cardiovascular;  Laterality: N/A;  . TEE WITHOUT CARDIOVERSION N/A 08/02/2013   Procedure: TRANSESOPHAGEAL ECHOCARDIOGRAM (TEE);  Surgeon: Casandra Doffing, MD;  Location: [place];  Service: Cardiovascular;  Laterality: N/A;  . UMBILICAL HERNIA REPAIR N/A 03/30/2018   Procedure: HERNIA REPAIR UMBILICAL;  Surgeon: Coralie Keens, MD;  Location: [place];  Service: General;  Laterality: N/A;     Current Outpatient Medications  Medication Sig Dispense Refill  . acetaminophen (TYLENOL) 650 MG CR tablet Take 650 mg by mouth 2 (two) times daily.     . ALPHAGAN P 0.1 % SOLN INSTILL 1 DROP IN BOTH EYES TWICE A DAY 5 mL 5  . Ascorbic Acid (VITAMIN C) 1000 MG tablet Take 1,000 mg by mouth daily.    Marland Kitchen atorvastatin (LIPITOR) 20 MG tablet Take 20 mg by mouth at bedtime.     Marland Kitchen b complex vitamins tablet Take 1 tablet by mouth daily.    Marland Kitchen diltiazem (CARDIZEM CD) 120 MG 24 hr capsule Take 1 capsule (120 mg total) by mouth daily. Please keep upcoming appt in March 2022 with Dr. Irish Lack before anymore refills. Thank you 90 capsule 0  . furosemide (LASIX) 40 MG tablet 40 mg in the am and 20 mg in the pm 30 tablet   . lisinopril (ZESTRIL) 10 MG tablet Take 10 mg by mouth daily.    . metoprolol succinate (TOPROL-XL) 25 MG 24 hr tablet Take 0.5 tablets (12.5 mg total) by mouth in the morning and at bedtime. 90 tablet 0  . warfarin (COUMADIN) 5 MG tablet TAKE AS DIRECTED BY COUMADIN CLINIC 120 tablet 1  . Multiple Vitamins-Minerals (PRESERVISION AREDS PO) Take 1 tablet by mouth 2 (two) times daily.      No  current facility-administered medications for this visit.    Allergies:   Penicillins    Social History:  The patient  reports that he quit smoking about 42 years ago. His smoking use included cigarettes. He quit after 8.00 years of use. He has never used smokeless tobacco. He reports that he does not drink alcohol and does not use drugs.   Family History:  The patient's family history includes Cancer in his father; Diabetes in his father and mother; Heart attack in his mother; Hyperlipidemia in his father and mother; Hypertension in his brother, father, and mother; Stroke in his father and mother.    ROS:  Please see the history of present illness.   Otherwise, review of  systems are positive for less exercise of late- going to get back to play golf.   All other systems are reviewed and negative.    PHYSICAL EXAM: VS:  BP 128/84   Pulse 84   Ht 5\' 8"  (1.727 m)   Wt 169 lb 9.6 oz (76.9 kg)   SpO2 96%   BMI 25.79 kg/m  , BMI Body mass index is 25.79 kg/m. GEN: Well nourished, well developed, in no acute distress  HEENT: normal  Neck: no JVD, carotid bruits, or masses Cardiac: RRR; 2/6  systolic murmur, no rubs, or gallops,no edema  Respiratory:  clear to auscultation bilaterally, normal work of breathing GI: soft, nontender, nondistended, + BS MS: no deformity or atrophy  Skin: warm and dry, no rash Neuro:  Strength and sensation are intact Psych: euthymic mood, full affect   EKG:   The ekg ordered today demonstrates AFib, RBBB, PVC   Recent Labs: No results found for requested labs within last 8760 hours.   Lipid Panel    Component Value Date/Time   CHOL 145 11/29/2019 1045   TRIG 116 11/29/2019 1045   HDL 51 11/29/2019 1045   CHOLHDL 2.8 11/29/2019 1045   LDLCALC 73 11/29/2019 1045     Other studies Reviewed: Additional studies/ records that were reviewed today with results demonstrating: labs reviewed.   ASSESSMENT AND PLAN:  1. AFib: Rate controlled.  Mo sx  of palpitations. The current medical regimen is effective;  continue present plan and medications. 2. Anticoagulated: Warfarin for stroke prevention.  No bleeding issues.  3. Mitral regurgitation: No CHF.   4. Aortic stenosis: No CHF.   5. CKD: Stage 3.  Avoid nephrotoxins.  6. Hyperlipidemia: LDL 93.  Continue atorvastatin. Whole food plant based diet.Avoid processed food.    Current medicines are reviewed at length with the patient today.  The patient concerns regarding his medicines were addressed.  The following changes have been made:  No change  Labs/ tests ordered today include:  No orders of the defined types were placed in this encounter.   Recommend 150 minutes/week of aerobic exercise Low fat, low carb, high fiber diet recommended  Disposition:   FU in 1 year   Signed, Larae Grooms, MD  12/16/2020 4:41 PM    [facility] [place], [place], [place]  [address] Phone: [phone]; Fax: [phone]

## 2020-12-16 ENCOUNTER — Other Ambulatory Visit: Payer: Self-pay

## 2020-12-16 ENCOUNTER — Encounter: Payer: Self-pay | Admitting: Interventional Cardiology

## 2020-12-16 ENCOUNTER — Ambulatory Visit (INDEPENDENT_AMBULATORY_CARE_PROVIDER_SITE_OTHER): Payer: Medicare Other | Admitting: Interventional Cardiology

## 2020-12-16 VITALS — BP 128/84 | HR 84 | Ht 68.0 in | Wt 169.6 lb

## 2020-12-16 DIAGNOSIS — I4821 Permanent atrial fibrillation: Secondary | ICD-10-CM | POA: Diagnosis not present

## 2020-12-16 DIAGNOSIS — I10 Essential (primary) hypertension: Secondary | ICD-10-CM | POA: Diagnosis not present

## 2020-12-16 DIAGNOSIS — Z952 Presence of prosthetic heart valve: Secondary | ICD-10-CM

## 2020-12-16 NOTE — Patient Instructions (Signed)

## 2020-12-23 DIAGNOSIS — N400 Enlarged prostate without lower urinary tract symptoms: Secondary | ICD-10-CM | POA: Diagnosis not present

## 2020-12-23 DIAGNOSIS — N433 Hydrocele, unspecified: Secondary | ICD-10-CM | POA: Diagnosis not present

## 2021-01-08 ENCOUNTER — Encounter (INDEPENDENT_AMBULATORY_CARE_PROVIDER_SITE_OTHER): Payer: Self-pay | Admitting: Ophthalmology

## 2021-01-08 ENCOUNTER — Other Ambulatory Visit: Payer: Self-pay

## 2021-01-08 ENCOUNTER — Ambulatory Visit (INDEPENDENT_AMBULATORY_CARE_PROVIDER_SITE_OTHER): Payer: Medicare Other | Admitting: Ophthalmology

## 2021-01-08 DIAGNOSIS — H353211 Exudative age-related macular degeneration, right eye, with active choroidal neovascularization: Secondary | ICD-10-CM

## 2021-01-08 MED ORDER — RANIBIZUMAB 0.5 MG/0.05ML IZ SOSY
0.5000 mg | PREFILLED_SYRINGE | INTRAVITREAL | Status: AC | PRN
  Administered 2021-01-08: .5 mg via INTRAVITREAL

## 2021-01-08 NOTE — Assessment & Plan Note (Signed)
Chronic active lesion OD yet controlled on intravitreal Lucentis.  Currently At follow-up interval 5 weeks.  Will repeat injection Lucentis today and examination again in 6 weeks

## 2021-01-08 NOTE — Progress Notes (Signed)
01/08/2021     CHIEF COMPLAINT Patient presents for Retina Follow Up (5 Wk F/U OD, poss Luc 0.5 OD//Pt denies noticeable changes to [place] OU since last visit. Pt denies ocular pain, flashes of light, or floaters OU. //)   HISTORY OF PRESENT ILLNESS: Jason [name] is a 87 y.o. male who presents to the clinic today for:   HPI    Retina Follow Up    Patient presents with  Dry AMD.  In right eye.  This started 5 weeks ago.  Severity is mild.  Duration of 5 weeks.  Since onset it is stable. Additional comments: 5 Wk F/U OD, poss Luc 0.5 OD  Pt denies noticeable changes to [place] OU since last visit. Pt denies ocular pain, flashes of light, or floaters OU.          Last edited by [name], [name] on 01/08/2021 10:13 AM. (History)      Referring physician: Shirline Frees, MD [place],  [place] [address]  HISTORICAL INFORMATION:   Selected notes from the MEDICAL RECORD NUMBER    Lab Results  Component Value Date   HGBA1C 5.4 08/11/2013     CURRENT MEDICATIONS: Current Outpatient Medications (Ophthalmic Drugs)  Medication Sig  . ALPHAGAN P 0.1 % SOLN INSTILL 1 DROP IN BOTH EYES TWICE A DAY   No current facility-administered medications for this visit. (Ophthalmic Drugs)   Current Outpatient Medications (Other)  Medication Sig  . acetaminophen (TYLENOL) 650 MG CR tablet Take 650 mg by mouth 2 (two) times daily.   . Ascorbic Acid (VITAMIN C) 1000 MG tablet Take 1,000 mg by mouth daily.  Marland Kitchen atorvastatin (LIPITOR) 20 MG tablet Take 20 mg by mouth at bedtime.   Marland Kitchen b complex vitamins tablet Take 1 tablet by mouth daily.  Marland Kitchen diltiazem (CARDIZEM CD) 120 MG 24 hr capsule Take 1 capsule (120 mg total) by mouth daily. Please keep upcoming appt in March 2022 with Dr. Irish Lack before anymore refills. Thank you  . furosemide (LASIX) 40 MG tablet 40 mg in the am and 20 mg in the pm  . lisinopril (ZESTRIL) 10 MG tablet Take 10 mg by mouth daily.  . metoprolol  succinate (TOPROL-XL) 25 MG 24 hr tablet Take 0.5 tablets (12.5 mg total) by mouth in the morning and at bedtime.  . Multiple Vitamins-Minerals (PRESERVISION AREDS PO) Take 1 tablet by mouth 2 (two) times daily.   Marland Kitchen warfarin (COUMADIN) 5 MG tablet TAKE AS DIRECTED BY COUMADIN CLINIC   No current facility-administered medications for this visit. (Other)      REVIEW OF SYSTEMS:    ALLERGIES Allergies  Allergen Reactions  . Penicillins Rash and Other (See Comments)    Has patient had a PCN reaction causing immediate rash, facial/tongue/throat swelling, SOB or lightheadedness with hypotension: # # Yes # # Has patient had a PCN reaction causing severe rash involving mucus membranes or skin necrosis: No Has patient had a PCN reaction that required hospitalization: No Has patient had a PCN reaction occurring within the last 10 years: No If all of the above answers are "NO", then may proceed with Cephalosporin use.     PAST MEDICAL HISTORY Past Medical History:  Diagnosis Date  . AK (actinic keratosis)   . Atrial fibrillation, persistent (HCC)    chronic coumadin therapy  . Bifascicular block   . Blindness    left eye  . CKD (chronic kidney disease), stage III ([place])   .  Coronary artery disease    20% RCA by 2014 cath  . Elevated PSA   . GERD (gastroesophageal reflux disease)   . H/O hiatal hernia   . Heart murmur   . History of kidney stones   . Hydrocele    left  . Hyperlipidemia   . Hypertension   . Internal hemorrhoids   . Kidney stones    kidney stones  . Macular degeneration    right eye  . Mitral regurgitation 02/25/2011   Recurrent MR s/p mitral valve repair then bioprosthetic MVR 2014  . OA (osteoarthritis) of neck   . S/P aortic valve replacement with bioprosthetic valve 08/15/2013   24mm Edwards Magna Ease bovine pericardial tissue valve  . S/P mitral valve repair 09/15/2004   Complex valvuloplasty including artificial Goretex neocord placement x4 and 13mm  SARP ring annuloplasty via right mini thoracotomy approach - Dr Evelina Dun @ [facility]  . S/P redo mitral valve replacement and aortic valve replacement with bioprosthetic valves 08/15/2013   59mm Edwards Magna Mitral bovine pericardial tissue valve  . Severe aortic stenosis 11/14   tissue AVR   . Vocal cord paralysis    Past Surgical History:  Procedure Laterality Date  . AORTIC VALVE REPLACEMENT N/A 08/15/2013   Procedure: AORTIC VALVE REPLACEMENT (AVR);  Surgeon: Rexene Alberts, MD;  Location: [place];  Service: Open Heart Surgery;  Laterality: N/A;  . CATARACT EXTRACTION Bilateral 2011   Dr. Bing Plume  . ESOPHAGOGASTRODUODENOSCOPY N/A 08/02/2013   Procedure: ESOPHAGOGASTRODUODENOSCOPY (EGD);  Surgeon: Casandra Doffing, MD;  Location: [facility] ENDOSCOPY;  Service: Cardiovascular;  Laterality: N/A;  . EYE SURGERY Right    infection  . GROIN MASS OPEN BIOPSY  2012  . HEMORRHOID SURGERY     pt denies.  . INGUINAL HERNIA REPAIR  08/2011  . INTRAOPERATIVE TRANSESOPHAGEAL ECHOCARDIOGRAM N/A 08/15/2013   Procedure: INTRAOPERATIVE TRANSESOPHAGEAL ECHOCARDIOGRAM;  Surgeon: Rexene Alberts, MD;  Location: [place];  Service: Open Heart Surgery;  Laterality: N/A;  . LEFT AND RIGHT HEART CATHETERIZATION WITH CORONARY ANGIOGRAM N/A 07/05/2013   Procedure: LEFT AND RIGHT HEART CATHETERIZATION WITH CORONARY ANGIOGRAM;  Surgeon: Jettie Booze, MD;  Location: [facility] CATH LAB;  Service: Cardiovascular;  Laterality: N/A;  . MASS EXCISION Right 03/30/2018   Procedure: EXCISION RIGHT FLANK  MASS;  Surgeon: Coralie Keens, MD;  Location: [facility];  Service: General;  Laterality: Right;  . MITRAL VALVE REPAIR  09/16/2011   @ [facility], Dr Evelina Dun  . MITRAL VALVE REPAIR N/A 08/15/2013   Procedure: REDO MITRAL VALVE (MV) REPLACEMENT;  Surgeon: Rexene Alberts, MD;  Location: [facility];  Service: Open Heart Surgery;  Laterality: N/A;  . TEE WITHOUT CARDIOVERSION N/A 07/05/2013   Procedure: TRANSESOPHAGEAL ECHOCARDIOGRAM (TEE);  Surgeon: Casandra Doffing, MD;  Location: [facility] ENDOSCOPY;  Service: Cardiovascular;  Laterality: N/A;  . TEE WITHOUT CARDIOVERSION N/A 08/02/2013   Procedure: TRANSESOPHAGEAL ECHOCARDIOGRAM (TEE);  Surgeon: Casandra Doffing, MD;  Location: [place];  Service: Cardiovascular;  Laterality: N/A;  . UMBILICAL HERNIA REPAIR N/A 03/30/2018   Procedure: HERNIA REPAIR UMBILICAL;  Surgeon: Coralie Keens, MD;  Location[facility];  Service: General;  Laterality: N/A;    FAMILY HISTORY Family History  Problem Relation Age of Onset  . Cancer Father        colon  . Hypertension Father   . Diabetes Father   . Hyperlipidemia Father   . Stroke Father   . Hypertension Mother   . Diabetes Mother   . Hyperlipidemia Mother   .  Stroke Mother   . Heart attack Mother   . Hypertension Brother        MI, S/P MVR    SOCIAL HISTORY Social History   Tobacco Use  . Smoking status: Former Smoker    Years: 8.00    Types: Cigarettes    Quit date: 10/12/1978    Years since quitting: 42.2  . Smokeless tobacco: Never Used  Vaping Use  . Vaping Use: Never used  Substance Use Topics  . Alcohol use: No  . Drug use: No         OPHTHALMIC EXAM:  Base Eye Exam    Visual Acuity (ETDRS)      Right Left   Dist cc 20/30 +1 20/400   Dist ph cc 20/25 NI   Correction: Glasses       Tonometry (Tonopen, 10:14 AM)      Right Left   Pressure 19 17       Pupils      Pupils Dark Light Shape React APD   Right PERRL 3.5 2.5 Round Slow None   Left PERRL 3.5 2.5 Round Slow None       Visual Fields (Counting fingers)      Left Right    Full Full       Extraocular Movement      Right Left    Full Full       Neuro/Psych    Oriented x3: Yes   Mood/Affect: Normal       Dilation    Right eye: 1.0% Mydriacyl, 2.5% Phenylephrine @ 10:17 AM        Slit Lamp and Fundus Exam    External Exam      Right Left   External Normal        Slit Lamp Exam      Right Left   Lids/Lashes Normal    Conjunctiva/Sclera White  and quiet    Cornea Clear    Anterior Chamber Deep and quiet    Iris Round and reactive    Lens Posterior chamber intraocular lens    Anterior Vitreous Normal        Fundus Exam      Right Left   Posterior Vitreous Posterior vitreous detachment    Disc Peripapillary atrophy    C/D Ratio 0.8    Macula Advanced age related macular degeneration, Atrophy, Retinal pigment epithelial atrophy, Disciform scar, [place], Mottling, Retinal pigment epithelial mottling, Retinal pigment epithelial atrophy - geographic, mature CNVM, now with subretinal hemorrhage ringing the fovea    Vessels Normal, , no DR    Periphery Normal           IMAGING AND PROCEDURES  Imaging and Procedures for 01/08/21  OCT, Retina - OU - Both Eyes       Right Eye Quality was good. Scan locations included subfoveal. Central Foveal Thickness: 329. Progression has been stable. Findings include cystoid macular edema, pigment epithelial detachment, disciform scar, intraretinal fluid, subretinal fluid.   Left Eye Quality was good. Scan locations included subfoveal. Central Foveal Thickness: 291. Progression has been stable. Findings include disciform scar, central retinal atrophy, outer retinal atrophy, inner retinal atrophy.   Notes OD, chronically active yet stable, for over 17 years in this office   Intraretinal fluid OD superotemporally, stable over time due to mature subfoveal fibrotic CNVM.  Subretinal hyper reflective material appears to be correlating with subretinal hemorrhage       Intravitreal Injection, Pharmacologic Agent - OD -  Right Eye       Time Out 01/08/2021. 10:49 AM. Confirmed correct patient, procedure, site, and patient consented.   Anesthesia Topical anesthesia was used. Anesthetic medications included Akten 3.5%.   Procedure Preparation included Ofloxacin . A 30 gauge needle was used.   Injection:  0.5 mg Ranibizumab SOSY (LUCENTIS) 0.5mg /0.2ml prefilled syr   NDC:  [id number], Lot: [id number]   Route: Intravitreal, Site: Right Eye, Waste: 0 mg  Post-op Post injection exam found visual acuity of at least counting fingers. The patient tolerated the procedure well. There were no complications. The patient received written and verbal post procedure care education. Post injection medications were not given.                 ASSESSMENT/PLAN:  Exudative age-related macular degeneration of right eye with active choroidal neovascularization (HCC) Chronic active lesion OD yet controlled on intravitreal Lucentis.  Currently At follow-up interval 5 weeks.  Will repeat injection Lucentis today and examination again in 6 weeks      ICD-10-CM   1. Exudative age-related macular degeneration of right eye with active choroidal neovascularization (HCC)  H35.3211 OCT, Retina - OU - Both Eyes    Intravitreal Injection, Pharmacologic Agent - OD - Right Eye    Ranibizumab SOSY 0.5 mg    1.  Today at 5-week interval, repeat intravitreal Lucentis and examination next in 6 weeks  2.  3.  Ophthalmic Meds Ordered this visit:  Meds ordered this encounter  Medications  . Ranibizumab SOSY 0.5 mg       Return in about 6 weeks (around 02/19/2021) for dilate, OD, LUCENTIS 0.5 OCT.  There are no Patient Instructions on file for this visit.   Explained the diagnoses, plan, and follow up with the patient and they expressed understanding.  Patient expressed understanding of the importance of proper follow up care.   Clent Demark Anni Hocevar M.D. Diseases & Surgery of the Retina and Vitreous [facility] [place] 01/08/21     Abbreviations: M myopia (nearsighted); A astigmatism; H hyperopia (farsighted); P presbyopia; Mrx spectacle prescription;  CTL contact lenses; OD right eye; OS left eye; OU both eyes  XT exotropia; ET esotropia; PEK punctate epithelial keratitis; PEE punctate epithelial erosions; DES dry eye syndrome; MGD meibomian gland dysfunction; ATs  artificial tears; PFAT's preservative free artificial tears; [place] nuclear sclerotic cataract; PSC posterior subcapsular cataract; ERM epi-retinal membrane; PVD posterior vitreous detachment; RD retinal detachment; DM diabetes mellitus; DR diabetic retinopathy; NPDR non-proliferative diabetic retinopathy; PDR proliferative diabetic retinopathy; CSME clinically significant macular edema; DME diabetic macular edema; dbh dot blot hemorrhages; CWS cotton wool spot; POAG primary open angle glaucoma; C/D cup-to-disc ratio; HVF humphrey visual field; GVF goldmann visual field; OCT optical coherence tomography; IOP intraocular pressure; BRVO Branch retinal vein occlusion; CRVO central retinal vein occlusion; CRAO central retinal artery occlusion; BRAO branch retinal artery occlusion; RT retinal tear; SB scleral buckle; PPV pars plana vitrectomy; VH Vitreous hemorrhage; PRP panretinal laser photocoagulation; IVK intravitreal kenalog; VMT vitreomacular traction; MH Macular hole;  NVD neovascularization of the disc; NVE neovascularization elsewhere; AREDS age related eye disease study; ARMD age related macular degeneration; POAG primary open angle glaucoma; EBMD epithelial/anterior basement membrane dystrophy; ACIOL anterior chamber intraocular lens; IOL intraocular lens; PCIOL posterior chamber intraocular lens; Phaco/IOL phacoemulsification with intraocular lens placement; [place] photorefractive keratectomy; LASIK laser assisted in situ keratomileusis; HTN hypertension; DM diabetes mellitus; COPD chronic obstructive pulmonary disease

## 2021-01-16 ENCOUNTER — Other Ambulatory Visit: Payer: Self-pay

## 2021-01-16 ENCOUNTER — Ambulatory Visit (INDEPENDENT_AMBULATORY_CARE_PROVIDER_SITE_OTHER): Payer: Medicare Other | Admitting: *Deleted

## 2021-01-16 DIAGNOSIS — Z5181 Encounter for therapeutic drug level monitoring: Secondary | ICD-10-CM | POA: Diagnosis not present

## 2021-01-16 DIAGNOSIS — Z952 Presence of prosthetic heart valve: Secondary | ICD-10-CM

## 2021-01-16 DIAGNOSIS — Z953 Presence of xenogenic heart valve: Secondary | ICD-10-CM

## 2021-01-16 LAB — POCT INR: INR: 2.7 (ref 2.0–3.0)

## 2021-01-16 NOTE — Patient Instructions (Signed)
Description   Continue taking Warfarin 1 tablet daily except for 1.5 tablets on Sundays and Thursdays. Recheck INR in 8 weeks. Call  Coumadin Clinic [phone] with any concerns,  new medications or if scheduled for any procedures

## 2021-02-10 ENCOUNTER — Other Ambulatory Visit: Payer: Self-pay | Admitting: Interventional Cardiology

## 2021-02-12 ENCOUNTER — Other Ambulatory Visit (INDEPENDENT_AMBULATORY_CARE_PROVIDER_SITE_OTHER): Payer: Self-pay | Admitting: Ophthalmology

## 2021-02-13 ENCOUNTER — Encounter (INDEPENDENT_AMBULATORY_CARE_PROVIDER_SITE_OTHER): Payer: Self-pay | Admitting: Ophthalmology

## 2021-02-13 ENCOUNTER — Ambulatory Visit (INDEPENDENT_AMBULATORY_CARE_PROVIDER_SITE_OTHER): Payer: Medicare Other | Admitting: Ophthalmology

## 2021-02-13 ENCOUNTER — Other Ambulatory Visit: Payer: Self-pay

## 2021-02-13 DIAGNOSIS — H353211 Exudative age-related macular degeneration, right eye, with active choroidal neovascularization: Secondary | ICD-10-CM

## 2021-02-13 DIAGNOSIS — H353113 Nonexudative age-related macular degeneration, right eye, advanced atrophic without subfoveal involvement: Secondary | ICD-10-CM

## 2021-02-13 DIAGNOSIS — E119 Type 2 diabetes mellitus without complications: Secondary | ICD-10-CM

## 2021-02-13 DIAGNOSIS — H353223 Exudative age-related macular degeneration, left eye, with inactive scar: Secondary | ICD-10-CM | POA: Diagnosis not present

## 2021-02-13 MED ORDER — RANIBIZUMAB 0.5 MG/0.05ML IZ SOSY
0.5000 mg | PREFILLED_SYRINGE | INTRAVITREAL | Status: AC | PRN
  Administered 2021-02-13: .5 mg via INTRAVITREAL

## 2021-02-13 NOTE — Assessment & Plan Note (Signed)
Not in the fovea at this time, discussed with patient this could enlarge as there is no treatment currently to prevent this type of vision impacting atrophy

## 2021-02-13 NOTE — Assessment & Plan Note (Signed)
Large chronic lesion, chronically stabilized and controlled at current interval around 5 to 6 weeks.

## 2021-02-13 NOTE — Assessment & Plan Note (Signed)
By OCT nerve encroachment or enlargement of disciform scar

## 2021-02-13 NOTE — Assessment & Plan Note (Signed)
No detectable diabetic retinopathy 

## 2021-02-13 NOTE — Progress Notes (Signed)
02/13/2021     CHIEF COMPLAINT Patient presents for Retina Follow Up (5 Wk F/U OD, poss Luc 0.5 OD//Pt denies noticeable changes to [place] OU since last visit. Pt denies ocular pain, flashes of light, or floaters OU. //)   HISTORY OF PRESENT ILLNESS: Jason [name] is a 87 y.o. male who presents to the clinic today for:   HPI    Retina Follow Up    Diagnosis: Wet AMD   Laterality: right eye   Onset: 5 weeks ago   Severity: mild   Duration: 5 weeks   Course: stable   Comments: 5 Wk F/U OD, poss Luc 0.5 OD  Pt denies noticeable changes to [place] OU since last visit. Pt denies ocular pain, flashes of light, or floaters OU.          Last edited by [name], [place] on 02/13/2021 10:31 AM. (History)      Referring physician: Shirline Frees, MD [address] [place],  [place] [id number]  HISTORICAL INFORMATION:   Selected notes from the MEDICAL RECORD NUMBER    Lab Results  Component Value Date   HGBA1C 5.4 08/11/2013     CURRENT MEDICATIONS: Current Outpatient Medications (Ophthalmic Drugs)  Medication Sig  . ALPHAGAN P 0.1 % SOLN INSTILL 1 DROP INTO BOTH EYES TWICE A DAY   No current facility-administered medications for this visit. (Ophthalmic Drugs)   Current Outpatient Medications (Other)  Medication Sig  . acetaminophen (TYLENOL) 650 MG CR tablet Take 650 mg by mouth 2 (two) times daily.   . Ascorbic Acid (VITAMIN C) 1000 MG tablet Take 1,000 mg by mouth daily.  Marland Kitchen atorvastatin (LIPITOR) 20 MG tablet Take 20 mg by mouth at bedtime.   Marland Kitchen b complex vitamins tablet Take 1 tablet by mouth daily.  Marland Kitchen diltiazem (CARDIZEM CD) 120 MG 24 hr capsule TAKE 1 CAPSULE BY MOUTH EVERY DAY  . furosemide (LASIX) 40 MG tablet 40 mg in the am and 20 mg in the pm  . lisinopril (ZESTRIL) 10 MG tablet Take 10 mg by mouth daily.  . metoprolol succinate (TOPROL-XL) 25 MG 24 hr tablet Take 0.5 tablets (12.5 mg total) by mouth in the morning and at bedtime.  . Multiple  Vitamins-Minerals (PRESERVISION AREDS PO) Take 1 tablet by mouth 2 (two) times daily.   Marland Kitchen warfarin (COUMADIN) 5 MG tablet TAKE AS DIRECTED BY COUMADIN CLINIC   No current facility-administered medications for this visit. (Other)      REVIEW OF SYSTEMS:    ALLERGIES Allergies  Allergen Reactions  . Penicillins Rash and Other (See Comments)    Has patient had a PCN reaction causing immediate rash, facial/tongue/throat swelling, SOB or lightheadedness with hypotension: # # Yes # # Has patient had a PCN reaction causing severe rash involving mucus membranes or skin necrosis: No Has patient had a PCN reaction that required hospitalization: No Has patient had a PCN reaction occurring within the last 10 years: No If all of the above answers are "NO", then may proceed with Cephalosporin use.     PAST MEDICAL HISTORY Past Medical History:  Diagnosis Date  . AK (actinic keratosis)   . Atrial fibrillation, persistent (HCC)    chronic coumadin therapy  . Bifascicular block   . Blindness    left eye  . CKD (chronic kidney disease), stage III ([place])   . Coronary artery disease    20% RCA by 2014 cath  . Elevated PSA   . GERD (  gastroesophageal reflux disease)   . H/O hiatal hernia   . Heart murmur   . History of kidney stones   . Hydrocele    left  . Hyperlipidemia   . Hypertension   . Internal hemorrhoids   . Kidney stones    kidney stones  . Macular degeneration    right eye  . Mitral regurgitation 02/25/2011   Recurrent MR s/p mitral valve repair then bioprosthetic MVR 2014  . OA (osteoarthritis) of neck   . S/P aortic valve replacement with bioprosthetic valve 08/15/2013   63mm Edwards Magna Ease bovine pericardial tissue valve  . S/P mitral valve repair 09/15/2004   Complex valvuloplasty including artificial Goretex neocord placement x4 and 65mm SARP ring annuloplasty via right mini thoracotomy approach - Dr Evelina Dun @ [facility]  . S/P redo mitral valve replacement and aortic  valve replacement with bioprosthetic valves 08/15/2013   42mm Edwards Magna Mitral bovine pericardial tissue valve  . Severe aortic stenosis 11/14   tissue AVR   . Vocal cord paralysis    Past Surgical History:  Procedure Laterality Date  . AORTIC VALVE REPLACEMENT N/A 08/15/2013   Procedure: AORTIC VALVE REPLACEMENT (AVR);  Surgeon: Rexene Alberts, MD;  Location: [place];  Service: Open Heart Surgery;  Laterality: N/A;  . CATARACT EXTRACTION Bilateral 2011   Dr. Bing Plume  . ESOPHAGOGASTRODUODENOSCOPY N/A 08/02/2013   Procedure: ESOPHAGOGASTRODUODENOSCOPY (EGD);  Surgeon: Casandra Doffing, MD;  Location: [facility] ENDOSCOPY;  Service: Cardiovascular;  Laterality: N/A;  . EYE SURGERY Right    infection  . GROIN MASS OPEN BIOPSY  2012  . HEMORRHOID SURGERY     pt denies.  . INGUINAL HERNIA REPAIR  08/2011  . INTRAOPERATIVE TRANSESOPHAGEAL ECHOCARDIOGRAM N/A 08/15/2013   Procedure: INTRAOPERATIVE TRANSESOPHAGEAL ECHOCARDIOGRAM;  Surgeon: Rexene Alberts, MD;  Location: [place];  Service: Open Heart Surgery;  Laterality: N/A;  . LEFT AND RIGHT HEART CATHETERIZATION WITH CORONARY ANGIOGRAM N/A 07/05/2013   Procedure: LEFT AND RIGHT HEART CATHETERIZATION WITH CORONARY ANGIOGRAM;  Surgeon: Jettie Booze, MD;  Location[facility] C[facility] LAB;  Service: Cardiovascular;  Laterality: N/A;  . MASS EXCISION Right 03/30/2018   Procedure: EXCISION RIGHT FLANK  MASS;  Surgeon: Coralie Keens, MD;  Location: [place];  Service: General;  Laterality: Right;  . MITRAL VALVE REPAIR  09/16/2011   @ [facility], Dr Evelina Dun  . MITRAL VALVE REPAIR N/A 08/15/2013   Procedure: REDO MITRAL VALVE (MV) REPLACEMENT;  Surgeon: Rexene Alberts, MD;  Location: [place];  Service: Open Heart Surgery;  Laterality: N/A;  . TEE WITHOUT CARDIOVERSION N/A 07/05/2013   Procedure: TRANSESOPHAGEAL ECHOCARDIOGRAM (TEE);  Surgeon: Casandra Doffing, MD;  Location: [facility] ENDOSCOPY;  Service: Cardiovascular;  Laterality: N/A;  . TEE WITHOUT CARDIOVERSION N/A 08/02/2013    Procedure: TRANSESOPHAGEAL ECHOCARDIOGRAM (TEE);  Surgeon: Casandra Doffing, MD;  Location: [place];  Service: Cardiovascular;  Laterality: N/A;  . UMBILICAL HERNIA REPAIR N/A 03/30/2018   Procedure: HERNIA REPAIR UMBILICAL;  Surgeon: Coralie Keens, MD;  Location: [facility] OR;  Service: General;  Laterality: N/A;    FAMILY HISTORY Family History  Problem Relation Age of Onset  . Cancer Father        colon  . Hypertension Father   . Diabetes Father   . Hyperlipidemia Father   . Stroke Father   . Hypertension Mother   . Diabetes Mother   . Hyperlipidemia Mother   . Stroke Mother   . Heart attack Mother   . Hypertension Brother  MI, S/P MVR    SOCIAL HISTORY Social History   Tobacco Use  . Smoking status: Former Smoker    Years: 8.00    Types: Cigarettes    Quit date: 10/12/1978    Years since quitting: 42.3  . Smokeless tobacco: Never Used  Vaping Use  . Vaping Use: Never used  Substance Use Topics  . Alcohol use: No  . Drug use: No         OPHTHALMIC EXAM: Base Eye Exam    Visual Acuity (ETDRS)      Right Left   Dist cc 20/30 -2 20/400   Dist ph cc 20/25 -1 NI   Correction: Glasses       Tonometry (Tonopen, 10:37 AM)      Right Left   Pressure 21 19       Pupils      Pupils Dark Light Shape React APD   Right PERRL 4 3 Round Brisk None   Left PERRL 4 3 Round Brisk None       Visual Fields (Counting fingers)      Left Right    Full Full       Extraocular Movement      Right Left    Full Full       Neuro/Psych    Oriented x3: Yes   Mood/Affect: Normal       Dilation    Right eye: 1.0% Mydriacyl, 2.5% Phenylephrine @ 10:37 AM        Slit Lamp and Fundus Exam    External Exam      Right Left   External Normal        Slit Lamp Exam      Right Left   Lids/Lashes Normal    Conjunctiva/Sclera White and quiet    Cornea Clear    Anterior Chamber Deep and quiet    Iris Round and reactive    Lens Posterior chamber intraocular lens     Anterior Vitreous Normal        Fundus Exam      Right Left   Posterior Vitreous Posterior vitreous detachment    Disc Peripapillary atrophy    C/D Ratio 0.8    Macula Advanced age related macular degeneration, Atrophy, Retinal pigment epithelial atrophy, Disciform scar, [place], Mottling, Retinal pigment epithelial mottling, Retinal pigment epithelial atrophy - geographic, mature CNVM, now with subretinal hemorrhage ringing the fovea, Geographic atrophy SUP to FAZ    Vessels Normal, , no DR    Periphery Normal           IMAGING AND PROCEDURES  Imaging and Procedures for 02/13/21  OCT, Retina - OU - Both Eyes       Right Eye Quality was good. Scan locations included subfoveal. Central Foveal Thickness: 337. Progression has been stable. Findings include cystoid macular edema, pigment epithelial detachment, disciform scar, intraretinal fluid, subretinal fluid.   Left Eye Quality was good. Scan locations included subfoveal. Central Foveal Thickness: 360. Progression has been stable. Findings include disciform scar, central retinal atrophy, outer retinal atrophy, inner retinal atrophy.   Notes OD, chronically active yet stable, for over 17 years in this office   Intraretinal fluid OD superotemporally, stable over time due to mature subfoveal fibrotic CNVM.  Subretinal hyper reflective material appears to be correlating with subretinal hemorrhage       Intravitreal Injection, Pharmacologic Agent - OD - Right Eye       Time Out 02/13/2021. 11:43 AM. Confirmed correct  patient, procedure, site, and patient consented.   Anesthesia Topical anesthesia was used. Anesthetic medications included Akten 3.5%.   Procedure Preparation included Ofloxacin . A 30 gauge needle was used.   Injection:  0.5 mg Ranibizumab SOSY (LUCENTIS) 0.5mg /0.56ml prefilled syr   NDC: [id number], Lot: [id number]   Route: Intravitreal, Site: Right Eye, Waste: 0 mg  Post-op Post injection exam found  visual acuity of at least counting fingers. The patient tolerated the procedure well. There were no complications. The patient received written and verbal post procedure care education. Post injection medications were not given.                 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  Advanced nonexudative age-related macular degeneration of right eye without subfoveal involvement Not in the fovea at this time, discussed with patient this could enlarge as there is no treatment currently to prevent this type of vision impacting atrophy      ICD-10-CM   1. Exudative age-related macular degeneration of right eye with active choroidal neovascularization (HCC)  H35.3211 OCT, Retina - OU - Both Eyes    Intravitreal Injection, Pharmacologic Agent - OD - Right Eye    Ranibizumab SOSY 0.5 mg  2. Diabetes mellitus without complication ([place])  E52.7   3. Exudative age-related macular degeneration of left eye with inactive scar ([place])  H35.3223   4. Advanced nonexudative age-related macular degeneration of right eye without subfoveal involvement  H35.3113     1.  OD, chronically active CNVM, controlled on intravitreal Lucentis now for some 16 years.  Monocular patient.  Resistant in the past Eylea and Avastin.  Continue today with Lucentis 0.5 mg followed hereafter by follow-up in 5 to 6 weeks  2.  3.  Ophthalmic Meds Ordered this visit:  Meds ordered this encounter  Medications  . Ranibizumab SOSY 0.5 mg       Return in about 6 weeks (around 03/27/2021) for dilate, OD, LUCENTIS 0.5  OCT.  There are no Patient Instructions on file for this visit.   Explained the diagnoses, plan, and follow up with the patient and they expressed understanding.  Patient expressed understanding of the importance of proper follow up care.   Clent Demark Bren Steers M.D. Diseases & Surgery of the Retina and Vitreous [facility] [place] 02/13/21     Abbreviations: M myopia (nearsighted); A astigmatism; H hyperopia (farsighted); P presbyopia; Mrx spectacle prescription;  CTL contact lenses; OD right eye; OS left eye; OU both eyes  XT exotropia; ET esotropia; PEK punctate epithelial keratitis; PEE punctate epithelial erosions; DES dry eye syndrome; MGD meibomian gland dysfunction; ATs artificial tears; PFAT's preservative free artificial tears; [place] nuclear sclerotic cataract; PSC posterior subcapsular cataract; ERM epi-retinal membrane; PVD posterior vitreous detachment; RD retinal detachment; DM diabetes mellitus; DR diabetic retinopathy; NPDR non-proliferative diabetic retinopathy; PDR proliferative diabetic retinopathy; CSME clinically significant macular edema; DME diabetic macular edema; dbh dot blot hemorrhages; CWS cotton wool spot; POAG primary open angle glaucoma; C/D cup-to-disc ratio; HVF humphrey visual field; GVF goldmann visual field; OCT optical coherence tomography; IOP intraocular pressure; BRVO Branch retinal vein occlusion; CRVO central retinal vein occlusion; CRAO central retinal artery occlusion; BRAO  branch retinal artery occlusion; RT retinal tear; SB scleral buckle; PPV pars plana vitrectomy; VH Vitreous hemorrhage; PRP panretinal laser photocoagulation; IVK intravitreal kenalog; VMT vitreomacular traction; MH Macular hole;  NVD neovascularization of the disc; NVE neovascularization elsewhere; AREDS age related eye disease study; ARMD age related macular degeneration; POAG primary open angle glaucoma; EBMD epithelial/anterior basement membrane dystrophy; ACIOL anterior chamber  intraocular lens; IOL intraocular lens; PCIOL posterior chamber intraocular lens; Phaco/IOL phacoemulsification with intraocular lens placement; [place] photorefractive keratectomy; LASIK laser assisted in situ keratomileusis; HTN hypertension; DM diabetes mellitus; COPD chronic obstructive pulmonary disease

## 2021-02-19 DIAGNOSIS — N1831 Chronic kidney disease, stage 3a: Secondary | ICD-10-CM | POA: Diagnosis not present

## 2021-02-19 DIAGNOSIS — M1990 Unspecified osteoarthritis, unspecified site: Secondary | ICD-10-CM | POA: Diagnosis not present

## 2021-02-19 DIAGNOSIS — I10 Essential (primary) hypertension: Secondary | ICD-10-CM | POA: Diagnosis not present

## 2021-02-19 DIAGNOSIS — E782 Mixed hyperlipidemia: Secondary | ICD-10-CM | POA: Diagnosis not present

## 2021-02-19 DIAGNOSIS — I4891 Unspecified atrial fibrillation: Secondary | ICD-10-CM | POA: Diagnosis not present

## 2021-02-25 ENCOUNTER — Other Ambulatory Visit: Payer: Self-pay | Admitting: Interventional Cardiology

## 2021-03-13 ENCOUNTER — Ambulatory Visit (INDEPENDENT_AMBULATORY_CARE_PROVIDER_SITE_OTHER): Payer: Medicare Other | Admitting: *Deleted

## 2021-03-13 ENCOUNTER — Other Ambulatory Visit: Payer: Self-pay

## 2021-03-13 DIAGNOSIS — Z5181 Encounter for therapeutic drug level monitoring: Secondary | ICD-10-CM | POA: Diagnosis not present

## 2021-03-13 DIAGNOSIS — Z953 Presence of xenogenic heart valve: Secondary | ICD-10-CM

## 2021-03-13 DIAGNOSIS — Z952 Presence of prosthetic heart valve: Secondary | ICD-10-CM

## 2021-03-13 LAB — POCT INR: INR: 1.8 — AB (ref 2.0–3.0)

## 2021-03-13 NOTE — Patient Instructions (Signed)
Description   Take 2 tablets of warfarin today and then continue taking Warfarin 1 tablet daily except for 1.5 tablets on Sundays and Thursdays. Recheck INR in 4 weeks. Call  Coumadin Clinic [phone] with any concerns,  new medications or if scheduled for any procedures

## 2021-03-31 ENCOUNTER — Ambulatory Visit (INDEPENDENT_AMBULATORY_CARE_PROVIDER_SITE_OTHER): Payer: Medicare Other | Admitting: Ophthalmology

## 2021-03-31 ENCOUNTER — Other Ambulatory Visit: Payer: Self-pay

## 2021-03-31 ENCOUNTER — Encounter (INDEPENDENT_AMBULATORY_CARE_PROVIDER_SITE_OTHER): Payer: Self-pay | Admitting: Ophthalmology

## 2021-03-31 DIAGNOSIS — H353211 Exudative age-related macular degeneration, right eye, with active choroidal neovascularization: Secondary | ICD-10-CM

## 2021-03-31 MED ORDER — RANIBIZUMAB 0.5 MG/0.05ML IZ SOSY
0.5000 mg | PREFILLED_SYRINGE | INTRAVITREAL | Status: AC | PRN
  Administered 2021-03-31: .5 mg via INTRAVITREAL

## 2021-03-31 NOTE — Assessment & Plan Note (Addendum)
The nature of wet macular degeneration was discussed with the patient.  Forms of therapy reviewed include the use of Anti-VEGF medications injected painlessly into the eye, as well as other possible treatment modalities, including thermal laser therapy. Fellow eye involvement and risks were discussed with the patient. Upon the finding of wet age related macular degeneration, treatment will be offered. The treatment regimen is on a treat as needed basis with the intent to treat if necessary and extend interval of exams when possible. On average 1 out of 6 patients do not need lifetime therapy. However, the risk of recurrent disease is high for a lifetime.  Initially monthly, then periodic, examinations and evaluations will determine whether the next treatment is required on the day of the examination.  OD currently at 6-week 4-day interval follow-up post Lucentis.  Stable acuity no interval change, yet on clinical examination there is an increased amount of subretinal hemorrhage inferior portion of the macula.  We will need to repeat Lucentis today and follow-up next in 5-week

## 2021-03-31 NOTE — Progress Notes (Signed)
03/31/2021     CHIEF COMPLAINT Patient presents for Macular Degeneration   HISTORY OF PRESENT ILLNESS: Jason [name] is a 87 y.o. male who presents to the clinic today for:   HPI   OD, 6-week 4-day follow-up interval post Lucentis 0.5 for wet AMD. Last edited by Hurman Horn, MD on 03/31/2021 10:58 AM.      Referring physician: Shirline Frees, MD [address] [place],  [place] [address]  HISTORICAL INFORMATION:   Selected notes from the MEDICAL RECORD NUMBER    Lab Results  Component Value Date   HGBA1C 5.4 08/11/2013     CURRENT MEDICATIONS: Current Outpatient Medications (Ophthalmic Drugs)  Medication Sig   ALPHAGAN P 0.1 % SOLN INSTILL 1 DROP INTO BOTH EYES TWICE A DAY   No current facility-administered medications for this visit. (Ophthalmic Drugs)   Current Outpatient Medications (Other)  Medication Sig   acetaminophen (TYLENOL) 650 MG CR tablet Take 650 mg by mouth 2 (two) times daily.    Ascorbic Acid (VITAMIN C) 1000 MG tablet Take 1,000 mg by mouth daily.   atorvastatin (LIPITOR) 20 MG tablet Take 20 mg by mouth at bedtime.    b complex vitamins tablet Take 1 tablet by mouth daily.   diltiazem (CARDIZEM CD) 120 MG 24 hr capsule TAKE 1 CAPSULE BY MOUTH EVERY DAY   furosemide (LASIX) 40 MG tablet 40 mg in the am and 20 mg in the pm   lisinopril (ZESTRIL) 10 MG tablet Take 10 mg by mouth daily.   metoprolol succinate (TOPROL-XL) 25 MG 24 hr tablet TAKE 1/2 TABLETS (12.5 MG TOTAL) BY MOUTH IN THE MORNING AND AT BEDTIME.   Multiple Vitamins-Minerals (PRESERVISION AREDS PO) Take 1 tablet by mouth 2 (two) times daily.    warfarin (COUMADIN) 5 MG tablet TAKE AS DIRECTED BY COUMADIN CLINIC   No current facility-administered medications for this visit. (Other)      REVIEW OF SYSTEMS:    ALLERGIES Allergies  Allergen Reactions   Penicillins Rash and Other (See Comments)    Has patient had a PCN reaction causing immediate rash,  facial/tongue/throat swelling, SOB or lightheadedness with hypotension: # # Yes # # Has patient had a PCN reaction causing severe rash involving mucus membranes or skin necrosis: No Has patient had a PCN reaction that required hospitalization: No Has patient had a PCN reaction occurring within the last 10 years: No If all of the above answers are "NO", then may proceed with Cephalosporin use.     PAST MEDICAL HISTORY Past Medical History:  Diagnosis Date   AK (actinic keratosis)    Atrial fibrillation, persistent (HCC)    chronic coumadin therapy   Bifascicular block    Blindness    left eye   CKD (chronic kidney disease), stage III (HCC)    Coronary artery disease    20% RCA by 2014 cath   Elevated PSA    GERD (gastroesophageal reflux disease)    H/O hiatal hernia    Heart murmur    History of kidney stones    Hydrocele    left   Hyperlipidemia    Hypertension    Internal hemorrhoids    Kidney stones    kidney stones   Macular degeneration    right eye   Mitral regurgitation 02/25/2011   Recurrent MR s/p mitral valve repair then bioprosthetic MVR 2014   OA (osteoarthritis) of neck    S/P aortic valve replacement with bioprosthetic valve  08/15/2013   82mm Edwards Magna Ease bovine pericardial tissue valve   S/P mitral valve repair 09/15/2004   Complex valvuloplasty including artificial Goretex neocord placement x4 and 36mm SARP ring annuloplasty via right mini thoracotomy approach - Dr Evelina Dun @ [facility]   S/P redo mitral valve replacement and aortic valve replacement with bioprosthetic valves 08/15/2013   22mm Edwards [facility] Mitral bovine pericardial tissue valve   Severe aortic stenosis 11/14   tissue AVR    Vocal cord paralysis    Past Surgical History:  Procedure Laterality Date   AORTIC VALVE REPLACEMENT N/A 08/15/2013   Procedure: AORTIC VALVE REPLACEMENT (AVR);  Surgeon: Rexene Alberts, MD;  Location: [place];  Service: Open Heart Surgery;  Laterality: N/A;   CATARACT  EXTRACTION Bilateral 2011   Dr. Bing Plume   ESOPHAGOGASTRODUODENOSCOPY N/A 08/02/2013   Procedure: ESOPHAGOGASTRODUODENOSCOPY (EGD);  Surgeon: Casandra Doffing, MD;  Location: [facility] ENDOSCOPY;  Service: Cardiovascular;  Laterality: N/A;   EYE SURGERY Right    infection   GROIN MASS OPEN BIOPSY  2012   HEMORRHOID SURGERY     pt denies.   INGUINAL HERNIA REPAIR  08/2011   INTRAOPERATIVE TRANSESOPHAGEAL ECHOCARDIOGRAM N/A 08/15/2013   Procedure: INTRAOPERATIVE TRANSESOPHAGEAL ECHOCARDIOGRAM;  Surgeon: Rexene Alberts, MD;  Location: [place];  Service: Open Heart Surgery;  Laterality: N/A;   LEFT AND RIGHT HEART CATHETERIZATION WITH CORONARY ANGIOGRAM N/A 07/05/2013   Procedure: LEFT AND RIGHT HEART CATHETERIZATION WITH CORONARY ANGIOGRAM;  Surgeon: Jettie Booze, MD;  Location[facility] CATH LAB;  Service: Cardiovascular;  Laterality: N/A;   MASS EXCISION Right 03/30/2018   Procedure: EXCISION RIGHT FLANK  MASS;  Surgeon: Coralie Keens, MD;  Location: [place];  Service: General;  Laterality: Right;   MITRAL VALVE REPAIR  09/16/2011   @ [facility], Dr Evelina Dun   MITRAL VALVE REPAIR N/A 08/15/2013   Procedure: REDO MITRAL VALVE (MV) REPLACEMENT;  Surgeon: Rexene Alberts, MD;  Location: [place];  Service: Open Heart Surgery;  Laterality: N/A;   TEE WITHOUT CARDIOVERSION N/A 07/05/2013   Procedure: TRANSESOPHAGEAL ECHOCARDIOGRAM (TEE);  Surgeon: Casandra Doffing, MD;  Location: [facility] ENDOSCOPY;  Service: Cardiovascular;  Laterality: N/A;   TEE WITHOUT CARDIOVERSION N/A 08/02/2013   Procedure: TRANSESOPHAGEAL ECHOCARDIOGRAM (TEE);  Surgeon: Casandra Doffing, MD;  Location: [place];  Service: Cardiovascular;  Laterality: N/A;   UMBILICAL HERNIA REPAIR N/A 03/30/2018   Procedure: HERNIA REPAIR UMBILICAL;  Surgeon: Coralie Keens, MD;  Location: [place];  Service: General;  Laterality: N/A;    FAMILY HISTORY Family History  Problem Relation Age of Onset   Cancer Father        colon   Hypertension Father    Diabetes Father     Hyperlipidemia Father    Stroke Father    Hypertension Mother    Diabetes Mother    Hyperlipidemia Mother    Stroke Mother    Heart attack Mother    Hypertension Brother        MI, S/P MVR    SOCIAL HISTORY Social History   Tobacco Use   Smoking status: Former    Years: 8.00    Pack years: 0.00    Types: Cigarettes    Quit date: 10/12/1978    Years since quitting: 42.4   Smokeless tobacco: Never  Vaping Use   Vaping Use: Never used  Substance Use Topics   Alcohol use: No   Drug use: No         OPHTHALMIC EXAM:  Base Eye Exam  Visual Acuity (ETDRS)       Right Left   Dist cc 20/25 -1 20/400         Tonometry (Tonopen, 10:59 AM)       Right Left   Pressure 17 18         Pupils       React APD   Right Brisk None   Left Brisk None         Visual Fields       Left Right    Full    Restrictions  Central scotoma         Dilation     Right eye: 1.0% Mydriacyl, 2.5% Phenylephrine @ 10:59 AM           Slit Lamp and Fundus Exam     External Exam       Right Left   External Normal Normal         Slit Lamp Exam       Right Left   Lids/Lashes Normal Normal   Conjunctiva/Sclera White and quiet White and quiet   Cornea Clear Clear   Anterior Chamber Deep and quiet Deep and quiet   Iris Round and reactive Round and reactive   Lens Posterior chamber intraocular lens Posterior chamber intraocular lens   Anterior Vitreous Normal Normal         Fundus Exam       Right Left   Posterior Vitreous Posterior vitreous detachment    Disc Peripapillary atrophy    C/D Ratio 0.8    Macula Advanced age related macular degeneration, Atrophy, Retinal pigment epithelial atrophy, Disciform scar, Drusen, Mottling, Retinal pigment epithelial mottling, Retinal pigment epithelial atrophy - geographic, mature CNVM, now with subretinal hemorrhage ringing the fovea and inferiorly, Geographic atrophy SUP to FAZ    Vessels Normal, , no DR     Periphery Normal             IMAGING AND PROCEDURES  Imaging and Procedures for 03/31/21  OCT, Retina - OU - Both Eyes       Right Eye Quality was good. Scan locations included subfoveal. Central Foveal Thickness: 315. Progression has been stable. Findings include cystoid macular edema, pigment epithelial detachment, disciform scar, intraretinal fluid, subretinal fluid.   Left Eye Quality was good. Scan locations included subfoveal. Central Foveal Thickness: 360. Progression has been stable. Findings include disciform scar, central retinal atrophy, outer retinal atrophy, inner retinal atrophy.   Notes OD, chronically active yet stable, for over 17 years in this office   Intraretinal fluid OD superotemporally, stable over time due to mature subfoveal fibrotic CNVM.  Subretinal hyper reflective material appears to be correlating with subretinal hemorrhage     Intravitreal Injection, Pharmacologic Agent - OD - Right Eye       Time Out 03/31/2021. 11:22 AM. Confirmed correct patient, procedure, site, and patient consented.   Anesthesia Topical anesthesia was used. Anesthetic medications included Akten 3.5%.   Procedure Preparation included Ofloxacin . A 30 gauge needle was used.   Injection: 0.5 mg Ranibizumab 0.5 MG/0.05ML   Route: Intravitreal   NDC: 16606-301-60, Lot: [id number], Waste: 0 mL   Post-op Post injection exam found visual acuity of at least counting fingers. The patient tolerated the procedure well. There were no complications. The patient received written and verbal post procedure care education. Post injection medications were not given.              ASSESSMENT/PLAN:  Exudative age-related macular  degeneration of right eye with active choroidal neovascularization ([place]) The nature of wet macular degeneration was discussed with the patient.  Forms of therapy reviewed include the use of Anti-VEGF medications injected painlessly into the eye, as well  as other possible treatment modalities, including thermal laser therapy. Fellow eye involvement and risks were discussed with the patient. Upon the finding of wet age related macular degeneration, treatment will be offered. The treatment regimen is on a treat as needed basis with the intent to treat if necessary and extend interval of exams when possible. On average 1 out of 6 patients do not need lifetime therapy. However, the risk of recurrent disease is high for a lifetime.  Initially monthly, then periodic, examinations and evaluations will determine whether the next treatment is required on the day of the examination.  OD currently at 6-week 4-day interval follow-up post Lucentis.  Stable acuity no interval change, yet on clinical examination there is an increased amount of subretinal hemorrhage inferior portion of the macula.  We will need to repeat Lucentis today and follow-up next in 5-week     ICD-10-CM   1. Exudative age-related macular degeneration of right eye with active choroidal neovascularization (HCC)  H35.3211 OCT, Retina - OU - Both Eyes    Intravitreal Injection, Pharmacologic Agent - OD - Right Eye    Ranibizumab SOSY 0.5 mg      1.  Repeat injection intravitreal Lucentis OD today and examination next in 5 weeks due to ongoing clinical findings of SR hemorrhage inferior to the fovea.  Although the OCT thickness centrally continues to be stable or better  2.  3.  Ophthalmic Meds Ordered this visit:  Meds ordered this encounter  Medications   Ranibizumab SOSY 0.5 mg       Return in about 5 weeks (around 05/05/2021) for dilate, OD, LUCENTIS 0.5 OCT.  There are no Patient Instructions on file for this visit.   Explained the diagnoses, plan, and follow up with the patient and they expressed understanding.  Patient expressed understanding of the importance of proper follow up care.   Clent Demark Malini Flemings M.D. Diseases & Surgery of the Retina and Vitreous Retina & Diabetic  [place] 03/31/21     Abbreviations: M myopia (nearsighted); A astigmatism; H hyperopia (farsighted); P presbyopia; Mrx spectacle prescription;  CTL contact lenses; OD right eye; OS left eye; OU both eyes  XT exotropia; ET esotropia; PEK punctate epithelial keratitis; PEE punctate epithelial erosions; DES dry eye syndrome; MGD meibomian gland dysfunction; ATs artificial tears; PFAT's preservative free artificial tears; [place] nuclear sclerotic cataract; PSC posterior subcapsular cataract; ERM epi-retinal membrane; PVD posterior vitreous detachment; RD retinal detachment; DM diabetes mellitus; DR diabetic retinopathy; NPDR non-proliferative diabetic retinopathy; PDR proliferative diabetic retinopathy; CSME clinically significant macular edema; DME diabetic macular edema; dbh dot blot hemorrhages; CWS cotton wool spot; POAG primary open angle glaucoma; C/D cup-to-disc ratio; HVF humphrey visual field; GVF goldmann visual field; OCT optical coherence tomography; IOP intraocular pressure; BRVO Branch retinal vein occlusion; CRVO central retinal vein occlusion; CRAO central retinal artery occlusion; BRAO branch retinal artery occlusion; RT retinal tear; SB scleral buckle; PPV pars plana vitrectomy; VH Vitreous hemorrhage; PRP panretinal laser photocoagulation; IVK intravitreal kenalog; VMT vitreomacular traction; MH Macular hole;  NVD neovascularization of the disc; NVE neovascularization elsewhere; AREDS age related eye disease study; ARMD age related macular degeneration; POAG primary open angle glaucoma; EBMD epithelial/anterior basement membrane dystrophy; ACIOL anterior chamber intraocular lens; IOL intraocular lens; PCIOL posterior chamber intraocular lens;  Phaco/IOL phacoemulsification with intraocular lens placement; Spur photorefractive keratectomy; LASIK laser assisted in situ keratomileusis; HTN hypertension; DM diabetes mellitus; COPD chronic obstructive pulmonary disease

## 2021-04-10 DIAGNOSIS — M1990 Unspecified osteoarthritis, unspecified site: Secondary | ICD-10-CM | POA: Diagnosis not present

## 2021-04-10 DIAGNOSIS — I10 Essential (primary) hypertension: Secondary | ICD-10-CM | POA: Diagnosis not present

## 2021-04-10 DIAGNOSIS — N1831 Chronic kidney disease, stage 3a: Secondary | ICD-10-CM | POA: Diagnosis not present

## 2021-04-10 DIAGNOSIS — E782 Mixed hyperlipidemia: Secondary | ICD-10-CM | POA: Diagnosis not present

## 2021-04-10 DIAGNOSIS — I4891 Unspecified atrial fibrillation: Secondary | ICD-10-CM | POA: Diagnosis not present

## 2021-04-17 ENCOUNTER — Other Ambulatory Visit: Payer: Self-pay

## 2021-04-17 ENCOUNTER — Ambulatory Visit (INDEPENDENT_AMBULATORY_CARE_PROVIDER_SITE_OTHER): Payer: Medicare Other | Admitting: *Deleted

## 2021-04-17 DIAGNOSIS — Z5181 Encounter for therapeutic drug level monitoring: Secondary | ICD-10-CM | POA: Diagnosis not present

## 2021-04-17 DIAGNOSIS — Z952 Presence of prosthetic heart valve: Secondary | ICD-10-CM | POA: Diagnosis not present

## 2021-04-17 DIAGNOSIS — Z953 Presence of xenogenic heart valve: Secondary | ICD-10-CM | POA: Diagnosis not present

## 2021-04-17 LAB — POCT INR: INR: 2.7 (ref 2.0–3.0)

## 2021-04-17 NOTE — Patient Instructions (Signed)
Description   Continue taking Warfarin 1 tablet daily except for 1.5 tablets on Sundays and Thursdays. Recheck INR in 5 weeks. Call  Coumadin Clinic [phone] with any concerns,  new medications or if scheduled for any procedures

## 2021-04-22 DIAGNOSIS — D485 Neoplasm of uncertain behavior of skin: Secondary | ICD-10-CM | POA: Diagnosis not present

## 2021-04-22 DIAGNOSIS — L821 Other seborrheic keratosis: Secondary | ICD-10-CM | POA: Diagnosis not present

## 2021-04-22 DIAGNOSIS — L814 Other melanin hyperpigmentation: Secondary | ICD-10-CM | POA: Diagnosis not present

## 2021-04-22 DIAGNOSIS — L905 Scar conditions and fibrosis of skin: Secondary | ICD-10-CM | POA: Diagnosis not present

## 2021-04-22 DIAGNOSIS — L570 Actinic keratosis: Secondary | ICD-10-CM | POA: Diagnosis not present

## 2021-04-22 DIAGNOSIS — L819 Disorder of pigmentation, unspecified: Secondary | ICD-10-CM | POA: Diagnosis not present

## 2021-04-22 DIAGNOSIS — L812 Freckles: Secondary | ICD-10-CM | POA: Diagnosis not present

## 2021-04-22 DIAGNOSIS — C44712 Basal cell carcinoma of skin of right lower limb, including hip: Secondary | ICD-10-CM | POA: Diagnosis not present

## 2021-04-22 DIAGNOSIS — D229 Melanocytic nevi, unspecified: Secondary | ICD-10-CM | POA: Diagnosis not present

## 2021-04-22 DIAGNOSIS — Z85828 Personal history of other malignant neoplasm of skin: Secondary | ICD-10-CM | POA: Diagnosis not present

## 2021-04-22 DIAGNOSIS — D1801 Hemangioma of skin and subcutaneous tissue: Secondary | ICD-10-CM | POA: Diagnosis not present

## 2021-05-05 ENCOUNTER — Encounter (INDEPENDENT_AMBULATORY_CARE_PROVIDER_SITE_OTHER): Payer: Self-pay | Admitting: Ophthalmology

## 2021-05-05 ENCOUNTER — Other Ambulatory Visit: Payer: Self-pay

## 2021-05-05 ENCOUNTER — Ambulatory Visit (INDEPENDENT_AMBULATORY_CARE_PROVIDER_SITE_OTHER): Payer: Medicare Other | Admitting: Ophthalmology

## 2021-05-05 DIAGNOSIS — H353211 Exudative age-related macular degeneration, right eye, with active choroidal neovascularization: Secondary | ICD-10-CM

## 2021-05-05 MED ORDER — RANIBIZUMAB 0.5 MG/0.05ML IZ SOSY
0.5000 mg | PREFILLED_SYRINGE | INTRAVITREAL | Status: AC | PRN
  Administered 2021-05-05: .5 mg via INTRAVITREAL

## 2021-05-05 NOTE — Progress Notes (Signed)
05/05/2021     CHIEF COMPLAINT Patient presents for Retina Follow Up (5 week fu OD and Lucentis OD/Pt states [facility] OU stable since last visit. Pt denies FOL, floaters, or ocular pain OU. /Pt reports using Alphagan BID OU/)   HISTORY OF PRESENT ILLNESS: Jason [name] is a 87 y.o. male who presents to the clinic today for:   HPI     Retina Follow Up           Diagnosis: Wet AMD   Laterality: right eye   Onset: 5 weeks ago   Severity: mild   Duration: 5 weeks   Course: stable   Comments: 5 week fu OD and Lucentis OD Pt states VA OU stable since last visit. Pt denies FOL, floaters, or ocular pain OU.  Pt reports using Alphagan BID OU        Last edited by Kendra Opitz, COA on 05/05/2021  3:03 PM.      Referring physician: Shirline Frees, MD [place],  [place] [address]  HISTORICAL INFORMATION:   Selected notes from the MEDICAL RECORD NUMBER    Lab Results  Component Value Date   HGBA1C 5.4 08/11/2013     CURRENT MEDICATIONS: Current Outpatient Medications (Ophthalmic Drugs)  Medication Sig   ALPHAGAN P 0.1 % SOLN INSTILL 1 DROP INTO BOTH EYES TWICE A DAY   No current facility-administered medications for this visit. (Ophthalmic Drugs)   Current Outpatient Medications (Other)  Medication Sig   acetaminophen (TYLENOL) 650 MG CR tablet Take 650 mg by mouth 2 (two) times daily.    Ascorbic Acid (VITAMIN C) 1000 MG tablet Take 1,000 mg by mouth daily.   atorvastatin (LIPITOR) 20 MG tablet Take 20 mg by mouth at bedtime.    b complex vitamins tablet Take 1 tablet by mouth daily.   diltiazem (CARDIZEM CD) 120 MG 24 hr capsule TAKE 1 CAPSULE BY MOUTH EVERY DAY   furosemide (LASIX) 40 MG tablet 40 mg in the am and 20 mg in the pm   lisinopril (ZESTRIL) 10 MG tablet Take 10 mg by mouth daily.   metoprolol succinate (TOPROL-XL) 25 MG 24 hr tablet TAKE 1/2 TABLETS (12.5 MG TOTAL) BY MOUTH IN THE MORNING AND AT BEDTIME.   Multiple  Vitamins-Minerals (PRESERVISION AREDS PO) Take 1 tablet by mouth 2 (two) times daily.    warfarin (COUMADIN) 5 MG tablet TAKE AS DIRECTED BY COUMADIN CLINIC   No current facility-administered medications for this visit. (Other)      REVIEW OF SYSTEMS:    ALLERGIES Allergies  Allergen Reactions   Penicillins Rash and Other (See Comments)    Has patient had a PCN reaction causing immediate rash, facial/tongue/throat swelling, SOB or lightheadedness with hypotension: # # Yes # # Has patient had a PCN reaction causing severe rash involving mucus membranes or skin necrosis: No Has patient had a PCN reaction that required hospitalization: No Has patient had a PCN reaction occurring within the last 10 years: No If all of the above answers are "NO", then may proceed with Cephalosporin use.     PAST MEDICAL HISTORY Past Medical History:  Diagnosis Date   AK (actinic keratosis)    Atrial fibrillation, persistent (HCC)    chronic coumadin therapy   Bifascicular block    Blindness    left eye   CKD (chronic kidney disease), stage III (HCC)    Coronary artery disease    20% RCA by 2014 cath  Elevated PSA    GERD (gastroesophageal reflux disease)    H/O hiatal hernia    Heart murmur    History of kidney stones    Hydrocele    left   Hyperlipidemia    Hypertension    Internal hemorrhoids    Kidney stones    kidney stones   Macular degeneration    right eye   Mitral regurgitation 02/25/2011   Recurrent MR s/p mitral valve repair then bioprosthetic MVR 2014   OA (osteoarthritis) of neck    S/P aortic valve replacement with bioprosthetic valve 08/15/2013   25m Edwards Magna Ease bovine pericardial tissue valve   S/P mitral valve repair 09/15/2004   Complex valvuloplasty including artificial Goretex neocord placement x4 and 371mSARP ring annuloplasty via right mini thoracotomy approach - [facility] S/P redo mitral valve replacement and aortic valve replacement with  bioprosthetic valves 08/15/2013   2759mdwards Magna Mitral bovine pericardial tissue valve   Severe aortic stenosis 11/14   tissue AVR    Vocal cord paralysis    Past Surgical History:  Procedure Laterality Date   AORTIC VALVE REPLACEMENT N/A 08/15/2013   Procedure: AORTIC VALVE REPLACEMENT (AVR);  Surgeon: ClaRexene AlbertsD;  [facility];  Laterality: N/A;   CATARACT EXTRACTION Bilateral 2011   Dr. DigBing PlumeESOPHAGOGASTRODUODENOSCOPY N/A 08/02/2013   Procedure: ESOPHAGOGASTRODUODENOSCOPY (EGD);  Surgeon: JayCasandra DoffingD;  [facility];  Service: Cardiovascular;  Laterality: N/A;   EYE SURGERY Right    infection   GROIN MASS OPEN BIOPSY  2012   HEMORRHOID SURGERY     pt denies.   INGUINAL HERNIA REPAIR  08/2011   INTRAOPERATIVE TRANSESOPHAGEAL ECHOCARDIOGRAM N/A 08/15/2013   Procedure: INTRAOPERATIVE TRANSESOPHAGEAL ECHOCARDIOGRAM;  Surgeon: ClaRexene AlbertsD;  [facility];  Laterality: N/A;   LEFT AND RIGHT HEART CATHETERIZATION WITH CORONARY ANGIOGRAM N/A 07/05/2013   Procedure: LEFT AND RIGHT HEART CATHETERIZATION WITH CORONARY ANGIOGRAM;  Surgeon: JayJettie BoozeD;  [facility];  Service: Cardiovascular;  Laterality: N/A;   MASS EXCISION Right 03/30/2018   Procedure: EXCISION RIGHT FLANK  MASS;  Surgeon: BlaCoralie KeensD;  [facility] [place];  Laterality: Right;   MITRAL VALVE REPAIR  09/16/2011   @ [facility], Dr GloEvelina DunMITRAL VALVE REPAIR N/A 08/15/2013   Procedure: REDO MITRAL VALVE (MV) REPLACEMENT;  Surgeon: ClaRexene AlbertsD;  [facility];  Laterality: N/A;   TEE WITHOUT CARDIOVERSION N/A 07/05/2013   Procedure: TRANSESOPHAGEAL ECHOCARDIOGRAM (TEE);  Surgeon: JayCasandra DoffingD;  [facility];  Service: Cardiovascular;  Laterality: N/A;   TEE WITHOUT CARDIOVERSION N/A 08/02/2013   Procedure: TRANSESOPHAGEAL ECHOCARDIOGRAM  (TEE);  Surgeon: JayCasandra DoffingD;  [facility] [place];  Laterality: N/A;   UMBILICAL HERNIA REPAIR N/A 03/30/2018   Procedure: HERNIA REPAIR UMBILICAL;  Surgeon: BlaCoralie KeensD;  [facility] [place];  Laterality: N/A;    FAMILY HISTORY Family History  Problem Relation Age of Onset   Cancer Father        colon   Hypertension Father    Diabetes Father    Hyperlipidemia Father    Stroke Father    Hypertension Mother    Diabetes Mother    Hyperlipidemia Mother    Stroke Mother    Heart attack Mother    Hypertension  Brother        MI, S/P MVR    SOCIAL HISTORY Social History   Tobacco Use   Smoking status: Former    Years: 8.00    Types: Cigarettes    Quit date: 10/12/1978    Years since quitting: 42.5   Smokeless tobacco: Never  Vaping Use   Vaping Use: Never used  Substance Use Topics   Alcohol use: No   Drug use: No         OPHTHALMIC EXAM:  Base Eye Exam     Visual Acuity (Snellen - Linear)       Right Left   Dist cc 20/25 20/400   Dist ph cc  NI    Correction: Glasses         Tonometry (Tonopen, 3:07 PM)       Right Left   Pressure 16 18         Pupils       Pupils Dark Light Shape React APD   Right PERRL 4 3 Round Brisk None   Left PERRL 4 3 Round Brisk None         Visual Fields       Left Right   Restrictions  Partial inner superior temporal, inferior temporal, superior nasal, inferior nasal deficiencies         Extraocular Movement       Right Left    Full Full         Neuro/Psych     Oriented x3: Yes         Dilation     Right eye: 1.0% Mydriacyl, 2.5% Phenylephrine @ 3:07 PM           Slit Lamp and Fundus Exam     External Exam       Right Left   External Normal Normal         Slit Lamp Exam       Right Left   Lids/Lashes Normal Normal   Conjunctiva/Sclera White and quiet White and quiet   Cornea Clear Clear   Anterior Chamber Deep and  quiet Deep and quiet   Iris Round and reactive Round and reactive   Lens Posterior chamber intraocular lens Posterior chamber intraocular lens   Anterior Vitreous Normal Normal         Fundus Exam       Right Left   Posterior Vitreous Posterior vitreous detachment    Disc Peripapillary atrophy    C/D Ratio 0.8    Macula Advanced age related macular degeneration, Atrophy, Retinal pigment epithelial atrophy, Disciform scar, [place], Mottling, Retinal pigment epithelial mottling, Retinal pigment epithelial atrophy - geographic, mature CNVM, now with subretinal hemorrhage ringing the fovea and inferiorly, Geographic atrophy SUP to FAZ    Vessels Normal, , no DR    Periphery Normal             IMAGING AND PROCEDURES  Imaging and Procedures for 05/05/21  OCT, Retina - OU - Both Eyes       Right Eye Quality was good. Scan locations included subfoveal. Central Foveal Thickness: 321. Progression has been stable. Findings include cystoid macular edema, pigment epithelial detachment, disciform scar, intraretinal fluid, subretinal fluid.   Left Eye Quality was good. Scan locations included subfoveal. Central Foveal Thickness: 360. Progression has been stable. Findings include disciform scar, central retinal atrophy, outer retinal atrophy, inner retinal atrophy.   Notes OD, chronically active yet stable, for over 17 years in this  office   Intraretinal fluid OD superotemporally, stable over time due to mature subfoveal fibrotic CNVM.  Subretinal hyper reflective material appears to be correlating with subretinal hemorrhage     Intravitreal Injection, Pharmacologic Agent - OD - Right Eye       Time Out 05/05/2021. 3:30 PM. Confirmed correct patient, procedure, site, and patient consented.   Anesthesia Topical anesthesia was used. Anesthetic medications included Akten 3.5%.   Procedure Preparation included Ofloxacin . A 30 gauge needle was used.   Injection: 0.5 mg  Ranibizumab 0.5 MG/0.05ML   Route: Intravitreal, Site: Right Eye   [id number], Lot: [id number], Waste: 0 mL   Post-op Post injection exam found visual acuity of at least counting fingers. The patient tolerated the procedure well. There were no complications. The patient received written and verbal post procedure care education. Post injection medications were not given.              ASSESSMENT/PLAN:  Exudative age-related macular degeneration of right eye with active choroidal neovascularization (HCC) OD still looks controlled at 5-week interval post Lucentis.  Region temporal to the foveal region with subretinal hemorrhages not in largest still stable     ICD-10-CM   1. Exudative age-related macular degeneration of right eye with active choroidal neovascularization (HCC)  H35.3211 OCT, Retina - OU - Both Eyes    Intravitreal Injection, Pharmacologic Agent - OD - Right Eye    Ranibizumab SOSY 0.5 mg      1.  OD looks great, the foveal region free of CNVM activity.  Area temporal to the fovea with SR heme has not enlarged and is stable and controlled and protected by intravitreal Lucentis currently at 5-week interval.  Repeat today and maintain at 5 weeks with a history of multiple recurrences and thus I  2.  No active disease OS  3.  Ophthalmic Meds Ordered this visit:  Meds ordered this encounter  Medications   Ranibizumab SOSY 0.5 mg       Return in about 5 weeks (around 06/09/2021) for dilate, OD, LUCENTIS 0.5 OCT.  There are no Patient Instructions on file for this visit.   Explained the diagnoses, plan, and follow up with the patient and they expressed understanding.  Patient expressed understanding of the importance of proper follow up care.   Clent Demark Farrie Sann M.D. Diseases & Surgery of the Retina and Vitreous [facility] [place] 05/05/21     Abbreviations: M myopia (nearsighted); A astigmatism; H hyperopia (farsighted); P presbyopia; Mrx  spectacle prescription;  CTL contact lenses; OD right eye; OS left eye; OU both eyes  XT exotropia; ET esotropia; PEK punctate epithelial keratitis; PEE punctate epithelial erosions; DES dry eye syndrome; MGD meibomian gland dysfunction; ATs artificial tears; PFAT's preservative free artificial tears; [place] nuclear sclerotic cataract; PSC posterior subcapsular cataract; ERM epi-retinal membrane; PVD posterior vitreous detachment; RD retinal detachment; DM diabetes mellitus; DR diabetic retinopathy; NPDR non-proliferative diabetic retinopathy; PDR proliferative diabetic retinopathy; CSME clinically significant macular edema; DME diabetic macular edema; dbh dot blot hemorrhages; CWS cotton wool spot; POAG primary open angle glaucoma; C/D cup-to-disc ratio; HVF humphrey visual field; GVF goldmann visual field; OCT optical coherence tomography; IOP intraocular pressure; BRVO Branch retinal vein occlusion; CRVO central retinal vein occlusion; CRAO central retinal artery occlusion; BRAO branch retinal artery occlusion; RT retinal tear; SB scleral buckle; PPV pars plana vitrectomy; VH Vitreous hemorrhage; PRP panretinal laser photocoagulation; IVK intravitreal kenalog; VMT vitreomacular traction; MH Macular hole;  NVD neovascularization of the disc;  NVE neovascularization elsewhere; AREDS age related eye disease study; ARMD age related macular degeneration; POAG primary open angle glaucoma; EBMD epithelial/anterior basement membrane dystrophy; ACIOL anterior chamber intraocular lens; IOL intraocular lens; PCIOL posterior chamber intraocular lens; Phaco/IOL phacoemulsification with intraocular lens placement; [place] photorefractive keratectomy; LASIK laser assisted in situ keratomileusis; HTN hypertension; DM diabetes mellitus; COPD chronic obstructive pulmonary disease

## 2021-05-05 NOTE — Assessment & Plan Note (Signed)
OD still looks controlled at 5-week interval post Lucentis.  Region temporal to the foveal region with subretinal hemorrhages not in largest still stable

## 2021-05-22 ENCOUNTER — Other Ambulatory Visit: Payer: Self-pay

## 2021-05-22 ENCOUNTER — Ambulatory Visit (INDEPENDENT_AMBULATORY_CARE_PROVIDER_SITE_OTHER): Payer: Medicare Other

## 2021-05-22 DIAGNOSIS — Z953 Presence of xenogenic heart valve: Secondary | ICD-10-CM | POA: Diagnosis not present

## 2021-05-22 DIAGNOSIS — I4821 Permanent atrial fibrillation: Secondary | ICD-10-CM | POA: Diagnosis not present

## 2021-05-22 DIAGNOSIS — Z5181 Encounter for therapeutic drug level monitoring: Secondary | ICD-10-CM

## 2021-05-22 DIAGNOSIS — Z952 Presence of prosthetic heart valve: Secondary | ICD-10-CM | POA: Diagnosis not present

## 2021-05-22 LAB — POCT INR: INR: 4 — AB (ref 2.0–3.0)

## 2021-05-22 NOTE — Patient Instructions (Signed)
Description   Hold tonight's dose and then continue taking Warfarin 1 tablet daily except for 1.5 tablets on Sundays and Thursdays. Recheck INR in 2 weeks. Call  Coumadin Clinic [phone] with any concerns,  new medications or if scheduled for any procedures

## 2021-06-04 DIAGNOSIS — E559 Vitamin D deficiency, unspecified: Secondary | ICD-10-CM | POA: Diagnosis not present

## 2021-06-04 DIAGNOSIS — I10 Essential (primary) hypertension: Secondary | ICD-10-CM | POA: Diagnosis not present

## 2021-06-04 DIAGNOSIS — E782 Mixed hyperlipidemia: Secondary | ICD-10-CM | POA: Diagnosis not present

## 2021-06-04 DIAGNOSIS — N1831 Chronic kidney disease, stage 3a: Secondary | ICD-10-CM | POA: Diagnosis not present

## 2021-06-04 DIAGNOSIS — E538 Deficiency of other specified B group vitamins: Secondary | ICD-10-CM | POA: Diagnosis not present

## 2021-06-04 DIAGNOSIS — Z0000 Encounter for general adult medical examination without abnormal findings: Secondary | ICD-10-CM | POA: Diagnosis not present

## 2021-06-04 DIAGNOSIS — I4891 Unspecified atrial fibrillation: Secondary | ICD-10-CM | POA: Diagnosis not present

## 2021-06-06 ENCOUNTER — Ambulatory Visit (INDEPENDENT_AMBULATORY_CARE_PROVIDER_SITE_OTHER): Payer: Medicare Other | Admitting: *Deleted

## 2021-06-06 ENCOUNTER — Other Ambulatory Visit: Payer: Self-pay

## 2021-06-06 DIAGNOSIS — Z952 Presence of prosthetic heart valve: Secondary | ICD-10-CM | POA: Diagnosis not present

## 2021-06-06 DIAGNOSIS — Z5181 Encounter for therapeutic drug level monitoring: Secondary | ICD-10-CM | POA: Diagnosis not present

## 2021-06-06 DIAGNOSIS — Z953 Presence of xenogenic heart valve: Secondary | ICD-10-CM | POA: Diagnosis not present

## 2021-06-06 LAB — POCT INR: INR: 3.7 — AB (ref 2.0–3.0)

## 2021-06-06 NOTE — Patient Instructions (Signed)
Description    Hold warfarin today and then start taking warfarin 1 tablet daily except for 1.5 tablets on Sundays. Recheck INR in 2 weeks. Coumadin Clinic [phone].

## 2021-06-09 ENCOUNTER — Other Ambulatory Visit: Payer: Self-pay

## 2021-06-09 ENCOUNTER — Ambulatory Visit (INDEPENDENT_AMBULATORY_CARE_PROVIDER_SITE_OTHER): Payer: Medicare Other | Admitting: Ophthalmology

## 2021-06-09 ENCOUNTER — Encounter (INDEPENDENT_AMBULATORY_CARE_PROVIDER_SITE_OTHER): Payer: Medicare Other | Admitting: Ophthalmology

## 2021-06-09 ENCOUNTER — Encounter (INDEPENDENT_AMBULATORY_CARE_PROVIDER_SITE_OTHER): Payer: Self-pay | Admitting: Ophthalmology

## 2021-06-09 DIAGNOSIS — H353113 Nonexudative age-related macular degeneration, right eye, advanced atrophic without subfoveal involvement: Secondary | ICD-10-CM

## 2021-06-09 DIAGNOSIS — H353211 Exudative age-related macular degeneration, right eye, with active choroidal neovascularization: Secondary | ICD-10-CM | POA: Diagnosis not present

## 2021-06-09 MED ORDER — RANIBIZUMAB 0.5 MG/0.05ML IZ SOSY
0.5000 mg | PREFILLED_SYRINGE | INTRAVITREAL | Status: AC | PRN
  Administered 2021-06-09: .5 mg via INTRAVITREAL

## 2021-06-09 NOTE — Assessment & Plan Note (Signed)
Mild atrophy not subfoveal

## 2021-06-09 NOTE — Assessment & Plan Note (Signed)
Chronic active disease roughly 17 years duration.,  Stable on Lucentis 0.5 with evidence of chronic disciform subfoveal activity

## 2021-06-09 NOTE — Progress Notes (Signed)
06/09/2021     CHIEF COMPLAINT Patient presents for No chief complaint on file.     HISTORY OF PRESENT ILLNESS: Jason [name] is a 87 y.o. male who presents to the clinic today for:   HPI   5 week fu od oct lucentis od Pt uses alphagan bid ou.  Patient states vision is stable and unchanged since last visit. Denies any new floaters or FOL. Pt states vision fluctuates, "sometimes there is a shadow." OD, no distortions overall this is a shadowing effect likely refractive Last edited by Hurman Horn, MD on 06/09/2021 10:36 AM.      Referring physician: Shirline Frees, MD [place],  [place] [address]  HISTORICAL INFORMATION:   Selected notes from the MEDICAL RECORD NUMBER    Lab Results  Component Value Date   HGBA1C 5.4 08/11/2013     CURRENT MEDICATIONS: Current Outpatient Medications (Ophthalmic Drugs)  Medication Sig   ALPHAGAN P 0.1 % SOLN INSTILL 1 DROP INTO BOTH EYES TWICE A DAY   No current facility-administered medications for this visit. (Ophthalmic Drugs)   Current Outpatient Medications (Other)  Medication Sig   acetaminophen (TYLENOL) 650 MG CR tablet Take 650 mg by mouth 2 (two) times daily.    Ascorbic Acid (VITAMIN C) 1000 MG tablet Take 1,000 mg by mouth daily.   atorvastatin (LIPITOR) 20 MG tablet Take 20 mg by mouth at bedtime.    b complex vitamins tablet Take 1 tablet by mouth daily.   diltiazem (CARDIZEM CD) 120 MG 24 hr capsule TAKE 1 CAPSULE BY MOUTH EVERY DAY   furosemide (LASIX) 40 MG tablet 40 mg in the am and 20 mg in the pm   lisinopril (ZESTRIL) 10 MG tablet Take 10 mg by mouth daily.   metoprolol succinate (TOPROL-XL) 25 MG 24 hr tablet TAKE 1/2 TABLETS (12.5 MG TOTAL) BY MOUTH IN THE MORNING AND AT BEDTIME.   Multiple Vitamins-Minerals (PRESERVISION AREDS PO) Take 1 tablet by mouth 2 (two) times daily.    warfarin (COUMADIN) 5 MG tablet TAKE AS DIRECTED BY COUMADIN CLINIC   No current facility-administered  medications for this visit. (Other)      REVIEW OF SYSTEMS:    ALLERGIES Allergies  Allergen Reactions   Penicillins Rash and Other (See Comments)    Has patient had a PCN reaction causing immediate rash, facial/tongue/throat swelling, SOB or lightheadedness with hypotension: # # Yes # # Has patient had a PCN reaction causing severe rash involving mucus membranes or skin necrosis: No Has patient had a PCN reaction that required hospitalization: No Has patient had a PCN reaction occurring within the last 10 years: No If all of the above answers are "NO", then may proceed with Cephalosporin use.     PAST MEDICAL HISTORY Past Medical History:  Diagnosis Date   AK (actinic keratosis)    Atrial fibrillation, persistent (HCC)    chronic coumadin therapy   Bifascicular block    Blindness    left eye   CKD (chronic kidney disease), stage III (HCC)    Coronary artery disease    20% RCA by 2014 cath   Elevated PSA    GERD (gastroesophageal reflux disease)    H/O hiatal hernia    Heart murmur    History of kidney stones    Hydrocele    left   Hyperlipidemia    Hypertension    Internal hemorrhoids    Kidney stones    kidney  stones   Macular degeneration    right eye   Mitral regurgitation 02/25/2011   Recurrent MR s/p mitral valve repair then bioprosthetic MVR 2014   OA (osteoarthritis) of neck    S/P aortic valve replacement with bioprosthetic valve 08/15/2013   53m Edwards Magna Ease bovine pericardial tissue valve   S/P mitral valve repair 09/15/2004   Complex valvuloplasty including artificial Goretex neocord placement x4 and 388mSARP ring annuloplasty via right mini thoracotomy approach [facility] S/P redo mitral valve replacement and aortic valve replacement with bioprosthetic valves 08/15/2013   [id number]   Severe aortic stenosis 11/14   tissue AVR    Vocal cord paralysis    Past Surgical History:   Procedure Laterality Date   AORTIC VALVE REPLACEMENT N/A 08/15/2013   Procedure: AORTIC VALVE REPLACEMENT (AVR);  Surgeon: ClaRexene AlbertsD;  [facility];  Laterality: N/A;   CATARACT EXTRACTION Bilateral 2011   Dr. DigBing PlumeESOPHAGOGASTRODUODENOSCOPY N/A 08/02/2013   Procedure: ESOPHAGOGASTRODUODENOSCOPY (EGD);  Surgeon: JayCasandra DoffingD;  [facility];  Service: Cardiovascular;  Laterality: N/A;   EYE SURGERY Right    infection   GROIN MASS OPEN BIOPSY  2012   HEMORRHOID SURGERY     pt denies.   INGUINAL HERNIA REPAIR  08/2011   INTRAOPERATIVE TRANSESOPHAGEAL ECHOCARDIOGRAM N/A 08/15/2013   Procedure: INTRAOPERATIVE TRANSESOPHAGEAL ECHOCARDIOGRAM;  Surgeon: ClaRexene AlbertsD;  [facility] [place] [facility];  Laterality: N/A;   LEFT AND RIGHT HEART CATHETERIZATION WITH CORONARY ANGIOGRAM N/A 07/05/2013   Procedure: LEFT AND RIGHT HEART CATHETERIZATION WITH CORONARY ANGIOGRAM;  Surgeon: JayJettie BoozeD;  [facility];  Service: Cardiovascular;  Laterality: N/A;   MASS EXCISION Right 03/30/2018   Procedure: EXCISION RIGHT FLANK  MASS;  Surgeon: BlaCoralie KeensD;  [facility] [place] [place];  Laterality: Right;   MITRAL VALVE REPAIR  09/16/2011   @ [facility], Dr GloEvelina DunMITRAL VALVE REPAIR N/A 08/15/2013   Procedure: REDO MITRAL VALVE (MV) REPLACEMENT;  Surgeon: ClaRexene AlbertsD;  [facility] [place] [place] [facility];  Laterality: N/A;   TEE WITHOUT CARDIOVERSION N/A 07/05/2013   Procedure: TRANSESOPHAGEAL ECHOCARDIOGRAM (TEE);  Surgeon: JayCasandra DoffingD;  [facility];  Service: Cardiovascular;  Laterality: N/A;   TEE WITHOUT CARDIOVERSION N/A 08/02/2013   Procedure: TRANSESOPHAGEAL ECHOCARDIOGRAM (TEE);  Surgeon: JayCasandra DoffingD;  [facility] [place];  Laterality: N/A;   UMBILICAL HERNIA REPAIR N/A 03/30/2018   Procedure: HERNIA REPAIR UMBILICAL;   Surgeon: BlaCoralie KeensD;  [facility] [place] [place];  Laterality: N/A;    FAMILY HISTORY Family History  Problem Relation Age of Onset   Cancer Father        colon   Hypertension Father    Diabetes Father    Hyperlipidemia Father    Stroke Father    Hypertension Mother    Diabetes Mother    Hyperlipidemia Mother    Stroke Mother    Heart attack Mother    Hypertension Brother        MI, S/P MVR    SOCIAL HISTORY Social History   Tobacco Use   Smoking status: Former    Years: 8.00    Types: Cigarettes    Quit date: 10/12/1978    Years since quitting: 42.6   Smokeless tobacco: Never  Vaping Use   Vaping Use: Never used  Substance Use Topics   Alcohol use: No   Drug use: No         OPHTHALMIC EXAM:  Base Eye Exam     Visual Acuity (ETDRS)       Right Left   Dist cc 20/40 -2 20/400   Dist ph cc NI NI    Correction: Glasses         Tonometry (Tonopen, 10:27 AM)       Right Left   Pressure 13 12         Pupils       Pupils Dark Light APD   Right PERRL 4 3 None   Left PERRL 4 3 None         Neuro/Psych     Oriented x3: Yes   Mood/Affect: Normal         Dilation     Right eye: 1.0% Mydriacyl, 2.5% Phenylephrine @ 10:27 AM           Slit Lamp and Fundus Exam     External Exam       Right Left   External Normal Normal         Slit Lamp Exam       Right Left   Lids/Lashes Normal Normal   Conjunctiva/Sclera White and quiet White and quiet   Cornea Clear Clear   Anterior Chamber Deep and quiet Deep and quiet   Iris Round and reactive Round and reactive   Lens Posterior chamber intraocular lens Posterior chamber intraocular lens   Anterior Vitreous Normal Normal         Fundus Exam       Right Left   Posterior Vitreous Posterior vitreous detachment    Disc Peripapillary atrophy    C/D Ratio 0.8    Macula Advanced age related macular degeneration, Atrophy, Retinal pigment epithelial atrophy,  Disciform scar, [place], Mottling, Retinal pigment epithelial mottling, Retinal pigment epithelial atrophy - geographic, mature CNVM, now with subretinal hemorrhage ringing the fovea and inferiorly, Geographic atrophy SUP to FAZ    Vessels Normal, , no DR    Periphery Normal             IMAGING AND PROCEDURES  Imaging and Procedures for 06/09/21  OCT, Retina - OU - Both Eyes       Right Eye Quality was good. Scan locations included subfoveal. Central Foveal Thickness: 340. Progression has been stable. Findings include cystoid macular edema, pigment epithelial detachment, disciform scar, intraretinal fluid, subretinal fluid.   Left Eye Quality was good. Scan locations included subfoveal. Central Foveal Thickness: 308. Progression has been stable. Findings include disciform scar, central retinal atrophy, outer retinal atrophy, inner retinal atrophy.   Notes OD, chronically active yet stable, for over 18 years in this office   Intraretinal fluid OD superotemporally, stable over time due to mature subfoveal fibrotic CNVM.  Subretinal hyper reflective material appears to be correlating with subretinal hemorrhage     Intravitreal Injection, Pharmacologic Agent - OD - Right Eye       Time Out 06/09/2021. 10:37 AM. Confirmed correct patient, procedure, site, and patient consented.   Anesthesia Topical anesthesia was used. Anesthetic medications included Akten 3.5%.   Procedure Preparation included 10% betadine to eyelids, 5% betadine to ocular surface. A 30 gauge needle was used.   Injection: 0.5 mg Ranibizumab 0.5 MG/0.05ML   Route: Intravitreal, Site: Right Eye   NDC: [id number], Lot: [id number], Waste: 0  mL   Post-op Post injection exam found visual acuity of at least counting fingers. The patient tolerated the procedure well. There were no complications. The patient received written and verbal post procedure care education. Post injection medications were not given.               ASSESSMENT/PLAN:  Advanced nonexudative age-related macular degeneration of right eye without subfoveal involvement Mild atrophy not subfoveal  Exudative age-related macular degeneration of right eye with active choroidal neovascularization (HCC) Chronic active disease roughly 17 years duration.,  Stable on Lucentis 0.5 with evidence of chronic disciform subfoveal activity     ICD-10-CM   1. Exudative age-related macular degeneration of right eye with active choroidal neovascularization (HCC)  H35.3211 OCT, Retina - OU - Both Eyes    Intravitreal Injection, Pharmacologic Agent - OD - Right Eye    Ranibizumab SOSY 0.5 mg    2. Advanced nonexudative age-related macular degeneration of right eye without subfoveal involvement  H35.3113       1.  OD much less active disease with less hemorrhage OD, repeat injection today and again evaluate in 6 weeks  2.  Stable acuity remains OD.  3.  Please note history of resistance to Avastin and Eylea OD  Ophthalmic Meds Ordered this visit:  Meds ordered this encounter  Medications   Ranibizumab SOSY 0.5 mg       Return in about 6 weeks (around 07/21/2021) for dilate, OD, LUCENTIS 0.5 OCT.  There are no Patient Instructions on file for this visit.   Explained the diagnoses, plan, and follow up with the patient and they expressed understanding.  Patient expressed understanding of the importance of proper follow up care.   Clent Demark Valaria Kohut M.D. Diseases & Surgery of the Retina and Vitreous [facility] [place] 06/09/21     Abbreviations: M myopia (nearsighted); A astigmatism; H hyperopia (farsighted); P presbyopia; Mrx spectacle prescription;  CTL contact lenses; OD right eye; OS left eye; OU both eyes  XT exotropia; ET esotropia; PEK punctate epithelial keratitis; PEE punctate epithelial erosions; DES dry eye syndrome; MGD meibomian gland dysfunction; ATs artificial tears; PFAT's preservative free artificial tears;  [place] nuclear sclerotic cataract; PSC posterior subcapsular cataract; ERM epi-retinal membrane; PVD posterior vitreous detachment; RD retinal detachment; DM diabetes mellitus; DR diabetic retinopathy; NPDR non-proliferative diabetic retinopathy; PDR proliferative diabetic retinopathy; CSME clinically significant macular edema; DME diabetic macular edema; dbh dot blot hemorrhages; CWS cotton wool spot; POAG primary open angle glaucoma; C/D cup-to-disc ratio; HVF humphrey visual field; GVF goldmann visual field; OCT optical coherence tomography; IOP intraocular pressure; BRVO Branch retinal vein occlusion; CRVO central retinal vein occlusion; CRAO central retinal artery occlusion; BRAO branch retinal artery occlusion; RT retinal tear; SB scleral buckle; PPV pars plana vitrectomy; VH Vitreous hemorrhage; PRP panretinal laser photocoagulation; IVK intravitreal kenalog; VMT vitreomacular traction; MH Macular hole;  NVD neovascularization of the disc; NVE neovascularization elsewhere; AREDS age related eye disease study; ARMD age related macular degeneration; POAG primary open angle glaucoma; EBMD epithelial/anterior basement membrane dystrophy; ACIOL anterior chamber intraocular lens; IOL intraocular lens; PCIOL posterior chamber intraocular lens; Phaco/IOL phacoemulsification with intraocular lens placement; [place] photorefractive keratectomy; LASIK laser assisted in situ keratomileusis; HTN hypertension; DM diabetes mellitus; COPD chronic obstructive pulmonary disease

## 2021-06-20 ENCOUNTER — Ambulatory Visit (INDEPENDENT_AMBULATORY_CARE_PROVIDER_SITE_OTHER): Payer: Medicare Other | Admitting: *Deleted

## 2021-06-20 ENCOUNTER — Other Ambulatory Visit: Payer: Self-pay

## 2021-06-20 DIAGNOSIS — Z5181 Encounter for therapeutic drug level monitoring: Secondary | ICD-10-CM | POA: Diagnosis not present

## 2021-06-20 DIAGNOSIS — Z952 Presence of prosthetic heart valve: Secondary | ICD-10-CM | POA: Diagnosis not present

## 2021-06-20 DIAGNOSIS — I4821 Permanent atrial fibrillation: Secondary | ICD-10-CM

## 2021-06-20 DIAGNOSIS — Z953 Presence of xenogenic heart valve: Secondary | ICD-10-CM

## 2021-06-20 LAB — POCT INR: INR: 3.5 — AB (ref 2.0–3.0)

## 2021-06-20 NOTE — Patient Instructions (Signed)
Description   Hold warfarin today and then start taking warfarin 1 tablet daily. Recheck INR in 2 weeks. Coumadin Clinic [phone].

## 2021-07-04 ENCOUNTER — Other Ambulatory Visit: Payer: Self-pay

## 2021-07-04 ENCOUNTER — Ambulatory Visit (INDEPENDENT_AMBULATORY_CARE_PROVIDER_SITE_OTHER): Payer: Medicare Other

## 2021-07-04 DIAGNOSIS — Z953 Presence of xenogenic heart valve: Secondary | ICD-10-CM | POA: Diagnosis not present

## 2021-07-04 DIAGNOSIS — Z5181 Encounter for therapeutic drug level monitoring: Secondary | ICD-10-CM

## 2021-07-04 DIAGNOSIS — Z952 Presence of prosthetic heart valve: Secondary | ICD-10-CM

## 2021-07-04 DIAGNOSIS — I4821 Permanent atrial fibrillation: Secondary | ICD-10-CM | POA: Diagnosis not present

## 2021-07-04 LAB — POCT INR: INR: 2 (ref 2.0–3.0)

## 2021-07-04 NOTE — Patient Instructions (Signed)
Description   Start taking warfarin 1 tablet daily except 1.5 tablets on Sundays. Recheck INR in 3 weeks. Coumadin Clinic [phone].

## 2021-07-16 DIAGNOSIS — Z23 Encounter for immunization: Secondary | ICD-10-CM | POA: Diagnosis not present

## 2021-07-21 ENCOUNTER — Encounter (INDEPENDENT_AMBULATORY_CARE_PROVIDER_SITE_OTHER): Payer: Medicare Other | Admitting: Ophthalmology

## 2021-07-22 ENCOUNTER — Ambulatory Visit (INDEPENDENT_AMBULATORY_CARE_PROVIDER_SITE_OTHER): Payer: Medicare Other | Admitting: Ophthalmology

## 2021-07-22 ENCOUNTER — Encounter (INDEPENDENT_AMBULATORY_CARE_PROVIDER_SITE_OTHER): Payer: Self-pay | Admitting: Ophthalmology

## 2021-07-22 ENCOUNTER — Other Ambulatory Visit: Payer: Self-pay

## 2021-07-22 DIAGNOSIS — H353211 Exudative age-related macular degeneration, right eye, with active choroidal neovascularization: Secondary | ICD-10-CM | POA: Diagnosis not present

## 2021-07-22 MED ORDER — RANIBIZUMAB 0.5 MG/0.05ML IZ SOSY
0.5000 mg | PREFILLED_SYRINGE | INTRAVITREAL | Status: AC | PRN
  Administered 2021-07-22: .5 mg via INTRAVITREAL

## 2021-07-22 NOTE — Assessment & Plan Note (Signed)
Chronic active CNVM best controlled at 6-week interval only on Lucentis.  Repeat injection today

## 2021-07-22 NOTE — Progress Notes (Signed)
07/22/2021     CHIEF COMPLAINT Patient presents for  Chief Complaint  Patient presents with   Retina Follow Up    5 week fu OD and Lucentis OD Pt states VA OU stable since last visit. Pt denies FOL, floaters, or ocular pain OU.  Pt reports using Alphagan BID OU       HISTORY OF PRESENT ILLNESS: Jason [name] is a 87 y.o. male who presents to the clinic today for:   HPI     Retina Follow Up   Patient presents with  Wet AMD.  In right eye.  This started 6 weeks ago.  Severity is mild.  Duration of 6 weeks.  Since onset it is stable. Additional comments: 5 week fu OD and Lucentis OD Pt states VA OU stable since last visit. Pt denies FOL, floaters, or ocular pain OU.  Pt reports using Alphagan BID OU         Comments   6 week fu OD oct lucentis OD. Patient states vision is stable and unchanged since last visit. Denies any new floaters or FOL. 5 week fu od oct lucentis od Pt uses alphagan bid ou. Pt takes AREDS twice a day. Patient states vision is stable and unchanged since last visit. Denies any new floaters or FOL. Pt states vision fluctuates in the right eye, "sometimes there is a shadow."         Last edited by Laurin Coder on 07/22/2021 10:47 AM.      Referring physician: Shirline Frees, MD [address],  [place] [id number]  HISTORICAL INFORMATION:   Selected notes from the MEDICAL RECORD NUMBER    Lab Results  Component Value Date   HGBA1C 5.4 08/11/2013     CURRENT MEDICATIONS: Current Outpatient Medications (Ophthalmic Drugs)  Medication Sig   ALPHAGAN P 0.1 % SOLN INSTILL 1 DROP INTO BOTH EYES TWICE A DAY   No current facility-administered medications for this visit. (Ophthalmic Drugs)   Current Outpatient Medications (Other)  Medication Sig   acetaminophen (TYLENOL) 650 MG CR tablet Take 650 mg by mouth 2 (two) times daily.    Ascorbic Acid (VITAMIN C) 1000 MG tablet Take 1,000 mg by mouth daily.   atorvastatin  (LIPITOR) 20 MG tablet Take 20 mg by mouth at bedtime.    b complex vitamins tablet Take 1 tablet by mouth daily.   diltiazem (CARDIZEM CD) 120 MG 24 hr capsule TAKE 1 CAPSULE BY MOUTH EVERY DAY   furosemide (LASIX) 40 MG tablet 40 mg in the am and 20 mg in the pm   lisinopril (ZESTRIL) 10 MG tablet Take 10 mg by mouth daily.   metoprolol succinate (TOPROL-XL) 25 MG 24 hr tablet TAKE 1/2 TABLETS (12.5 MG TOTAL) BY MOUTH IN THE MORNING AND AT BEDTIME.   Multiple Vitamins-Minerals (PRESERVISION AREDS PO) Take 1 tablet by mouth 2 (two) times daily.    warfarin (COUMADIN) 5 MG tablet TAKE AS DIRECTED BY COUMADIN CLINIC   No current facility-administered medications for this visit. (Other)      REVIEW OF SYSTEMS:    ALLERGIES Allergies  Allergen Reactions   Penicillins Rash and Other (See Comments)    Has patient had a PCN reaction causing immediate rash, facial/tongue/throat swelling, SOB or lightheadedness with hypotension: # # Yes # # Has patient had a PCN reaction causing severe rash involving mucus membranes or skin necrosis: No Has patient had a PCN reaction that required hospitalization: No Has  patient had a PCN reaction occurring within the last 10 years: No If all of the above answers are "NO", then may proceed with Cephalosporin use.     PAST MEDICAL HISTORY Past Medical History:  Diagnosis Date   AK (actinic keratosis)    Atrial fibrillation, persistent (HCC)    chronic coumadin therapy   Bifascicular block    Blindness    left eye   CKD (chronic kidney disease), stage III (HCC)    Coronary artery disease    20% RCA by 2014 cath   Elevated PSA    GERD (gastroesophageal reflux disease)    H/O hiatal hernia    Heart murmur    History of kidney stones    Hydrocele    left   Hyperlipidemia    Hypertension    Internal hemorrhoids    Kidney stones    kidney stones   Macular degeneration    right eye   Mitral regurgitation 02/25/2011   Recurrent MR s/p mitral  valve repair then bioprosthetic MVR 2014   OA (osteoarthritis) of neck    S/P aortic valve replacement with bioprosthetic valve 08/15/2013   61mm Edwards Magna Ease bovine pericardial tissue valve   S/P mitral valve repair 09/15/2004   Complex valvuloplasty including artificial Goretex neocord placement x4 and 85mm SARP ring annuloplasty via right mini thoracotomy approach - Dr Evelina Dun @ [facility]   S/P redo mitral valve replacement and aortic valve replacement with bioprosthetic valves 08/15/2013   67mm Edwards Magna Mitral bovine pericardial tissue valve   Severe aortic stenosis 11/14   tissue AVR    Vocal cord paralysis    Past Surgical History:  Procedure Laterality Date   AORTIC VALVE REPLACEMENT N/A 08/15/2013   Procedure: AORTIC VALVE REPLACEMENT (AVR);  Surgeon: Rexene Alberts, MD;  Location: [place];  Service: Open Heart Surgery;  Laterality: N/A;   CATARACT EXTRACTION Bilateral 2011   Dr. Bing Plume   ESOPHAGOGASTRODUODENOSCOPY N/A 08/02/2013   Procedure: ESOPHAGOGASTRODUODENOSCOPY (EGD);  Surgeon: Casandra Doffing, MD;  Location: [facility] ENDOSCOPY;  Service: Cardiovascular;  Laterality: N/A;   EYE SURGERY Right    infection   GROIN MASS OPEN BIOPSY  2012   HEMORRHOID SURGERY     pt denies.   INGUINAL HERNIA REPAIR  08/2011   INTRAOPERATIVE TRANSESOPHAGEAL ECHOCARDIOGRAM N/A 08/15/2013   Procedure: INTRAOPERATIVE TRANSESOPHAGEAL ECHOCARDIOGRAM;  Surgeon: Rexene Alberts, MD;  Location: [place];  Service: Open Heart Surgery;  Laterality: N/A;   LEFT AND RIGHT HEART CATHETERIZATION WITH CORONARY ANGIOGRAM N/A 07/05/2013   Procedure: LEFT AND RIGHT HEART CATHETERIZATION WITH CORONARY ANGIOGRAM;  Surgeon: Jettie Booze, MD;  Location: [facility] CATH LAB;  Service: Cardiovascular;  Laterality: N/A;   MASS EXCISION Right 03/30/2018   Procedure: EXCISION RIGHT FLANK  MASS;  Surgeon: Coralie Keens, MD;  Location: [facility];  Service: General;  Laterality: Right;   MITRAL VALVE REPAIR  09/16/2011   @ [facility], Dr  Evelina Dun   MITRAL VALVE REPAIR N/A 08/15/2013   Procedure: REDO MITRAL VALVE (MV) REPLACEMENT;  Surgeon: Rexene Alberts, MD;  Location: [facility];  Service: Open Heart Surgery;  Laterality: N/A;   TEE WITHOUT CARDIOVERSION N/A 07/05/2013   Procedure: TRANSESOPHAGEAL ECHOCARDIOGRAM (TEE);  Surgeon: Casandra Doffing, MD;  Location: [facility] ENDOSCOPY;  Service: Cardiovascular;  Laterality: N/A;   TEE WITHOUT CARDIOVERSION N/A 08/02/2013   Procedure: TRANSESOPHAGEAL ECHOCARDIOGRAM (TEE);  Surgeon: Casandra Doffing, MD;  Location: [place];  Service: Cardiovascular;  Laterality: N/A;   UMBILICAL HERNIA REPAIR N/A 03/30/2018  Procedure: HERNIA REPAIR UMBILICAL;  Surgeon: Coralie Keens, MD;  Location: [facility];  Service: General;  Laterality: N/A;    FAMILY HISTORY Family History  Problem Relation Age of Onset   Cancer Father        colon   Hypertension Father    Diabetes Father    Hyperlipidemia Father    Stroke Father    Hypertension Mother    Diabetes Mother    Hyperlipidemia Mother    Stroke Mother    Heart attack Mother    Hypertension Brother        MI, S/P MVR    SOCIAL HISTORY Social History   Tobacco Use   Smoking status: Former    Years: 8.00    Types: Cigarettes    Quit date: 10/12/1978    Years since quitting: 42.8   Smokeless tobacco: Never  Vaping Use   Vaping Use: Never used  Substance Use Topics   Alcohol use: No   Drug use: No         OPHTHALMIC EXAM:  Base Eye Exam     Visual Acuity (ETDRS)       Right Left   Dist cc 20/30 +2 20/400   Dist ph cc  NI    Correction: Glasses         Tonometry (Tonopen, 10:50 AM)       Right Left   Pressure 19 18         Pupils       Pupils Dark Light Shape React APD   Right PERRL 4 3 Round Brisk None   Left PERRL 4 3 Round Brisk None         Extraocular Movement       Right Left    Full Full         Neuro/Psych     Oriented x3: Yes   Mood/Affect: Normal         Dilation     Right eye: 1.0%  Mydriacyl, 2.5% Phenylephrine @ 10:50 AM           Slit Lamp and Fundus Exam     External Exam       Right Left   External Normal Normal         Slit Lamp Exam       Right Left   Lids/Lashes Normal Normal   Conjunctiva/Sclera White and quiet White and quiet   Cornea Clear Clear   Anterior Chamber Deep and quiet Deep and quiet   Iris Round and reactive Round and reactive   Lens Posterior chamber intraocular lens Posterior chamber intraocular lens   Anterior Vitreous Normal Normal         Fundus Exam       Right Left   Posterior Vitreous Posterior vitreous detachment    Disc Peripapillary atrophy    C/D Ratio 0.8    Macula Advanced age related macular degeneration, Atrophy, Retinal pigment epithelial atrophy, Disciform scar, [place], Mottling, Retinal pigment epithelial mottling, Retinal pigment epithelial atrophy - geographic, mature CNVM, now with subretinal hemorrhage ringing the fovea and inferiorly, Geographic atrophy SUP to FAZ    Vessels Normal, , no DR    Periphery Normal             IMAGING AND PROCEDURES  Imaging and Procedures for 07/22/21  Intravitreal Injection, Pharmacologic Agent - OD - Right Eye       Time Out 07/22/2021. 11:24 AM. Confirmed correct patient, procedure, site, and patient  consented.   Anesthesia Topical anesthesia was used. Anesthetic medications included Akten 3.5%.   Procedure Preparation included 10% betadine to eyelids, 5% betadine to ocular surface. A 30 gauge needle was used.   Injection: 0.5 mg Ranibizumab 0.5 MG/0.05ML   Route: Intravitreal, Site: Right Eye   NDC: [id number], Lot: [id number], Waste: 0 mL   Post-op Post injection exam found visual acuity of at least counting fingers. The patient tolerated the procedure well. There were no complications. The patient received written and verbal post procedure care education. Post injection medications included ocuflox.      OCT, Retina - OU - Both Eyes        Right Eye Quality was good. Scan locations included subfoveal. Central Foveal Thickness: 326. Progression has been stable. Findings include cystoid macular edema, pigment epithelial detachment, disciform scar, intraretinal fluid, subretinal fluid.   Left Eye Quality was good. Scan locations included subfoveal. Central Foveal Thickness: 308. Progression has been stable. Findings include disciform scar, central retinal atrophy, outer retinal atrophy, inner retinal atrophy.   Notes OD, chronically active yet stable, for over 15 years in this office   Intraretinal fluid OD superotemporally, stable over time due to mature subfoveal fibrotic CNVM.  Subretinal hyper reflective material appears to be correlating with subretinal hemorrhage             ASSESSMENT/PLAN:  Exudative age-related macular degeneration of right eye with active choroidal neovascularization (HCC) Chronic active CNVM best controlled at 6-week interval only on Lucentis.  Repeat injection today     ICD-10-CM   1. Exudative age-related macular degeneration of right eye with active choroidal neovascularization (HCC)  H35.3211 Intravitreal Injection, Pharmacologic Agent - OD - Right Eye    OCT, Retina - OU - Both Eyes    Ranibizumab SOSY 0.5 mg      1.  OD with chronic active CNVM now for 17 years or so on Lucentis OD.  Stabilized and has had recurrences in the past on longer intervals.  Monocular status.  Repeat injection today  2.  Dilate OD next 6-week  3.  Ophthalmic Meds Ordered this visit:  Meds ordered this encounter  Medications   Ranibizumab SOSY 0.5 mg       Return in about 6 weeks (around 09/02/2021) for dilate, OD, LUCENTIS 0.5 OCT.  There are no Patient Instructions on file for this visit.   Explained the diagnoses, plan, and follow up with the patient and they expressed understanding.  Patient expressed understanding of the importance of proper follow up care.   Clent Demark Sara Keys  M.D. Diseases & Surgery of the Retina and Vitreous Retina & Diabetic [place] 07/22/21     Abbreviations: M myopia (nearsighted); A astigmatism; H hyperopia (farsighted); P presbyopia; Mrx spectacle prescription;  CTL contact lenses; OD right eye; OS left eye; OU both eyes  XT exotropia; ET esotropia; PEK punctate epithelial keratitis; PEE punctate epithelial erosions; DES dry eye syndrome; MGD meibomian gland dysfunction; ATs artificial tears; PFAT's preservative free artificial tears; [place] nuclear sclerotic cataract; PSC posterior subcapsular cataract; ERM epi-retinal membrane; PVD posterior vitreous detachment; RD retinal detachment; DM diabetes mellitus; DR diabetic retinopathy; NPDR non-proliferative diabetic retinopathy; PDR proliferative diabetic retinopathy; CSME clinically significant macular edema; DME diabetic macular edema; dbh dot blot hemorrhages; CWS cotton wool spot; POAG primary open angle glaucoma; C/D cup-to-disc ratio; HVF humphrey visual field; GVF goldmann visual field; OCT optical coherence tomography; IOP intraocular pressure; BRVO Branch retinal vein occlusion; CRVO central retinal vein occlusion; CRAO central  retinal artery occlusion; BRAO branch retinal artery occlusion; RT retinal tear; SB scleral buckle; PPV pars plana vitrectomy; VH Vitreous hemorrhage; PRP panretinal laser photocoagulation; IVK intravitreal kenalog; VMT vitreomacular traction; MH Macular hole;  NVD neovascularization of the disc; NVE neovascularization elsewhere; AREDS age related eye disease study; ARMD age related macular degeneration; POAG primary open angle glaucoma; EBMD epithelial/anterior basement membrane dystrophy; ACIOL anterior chamber intraocular lens; IOL intraocular lens; PCIOL posterior chamber intraocular lens; Phaco/IOL phacoemulsification with intraocular lens placement; [place] photorefractive keratectomy; LASIK laser assisted in situ keratomileusis; HTN hypertension; DM diabetes mellitus; COPD  chronic obstructive pulmonary disease

## 2021-07-25 ENCOUNTER — Other Ambulatory Visit: Payer: Self-pay

## 2021-07-25 ENCOUNTER — Ambulatory Visit (INDEPENDENT_AMBULATORY_CARE_PROVIDER_SITE_OTHER): Payer: Medicare Other | Admitting: *Deleted

## 2021-07-25 DIAGNOSIS — I4821 Permanent atrial fibrillation: Secondary | ICD-10-CM | POA: Diagnosis not present

## 2021-07-25 DIAGNOSIS — Z5181 Encounter for therapeutic drug level monitoring: Secondary | ICD-10-CM | POA: Diagnosis not present

## 2021-07-25 DIAGNOSIS — Z953 Presence of xenogenic heart valve: Secondary | ICD-10-CM

## 2021-07-25 DIAGNOSIS — Z952 Presence of prosthetic heart valve: Secondary | ICD-10-CM

## 2021-07-25 LAB — POCT INR: INR: 3.2 — AB (ref 2.0–3.0)

## 2021-07-25 NOTE — Patient Instructions (Signed)
Description   Today take 1/2 tablet the continue taking warfarin 1 tablet daily except 1.5 tablets on Sundays. Recheck INR in 3 weeks. Coumadin Clinic [phone].

## 2021-07-30 ENCOUNTER — Other Ambulatory Visit (INDEPENDENT_AMBULATORY_CARE_PROVIDER_SITE_OTHER): Payer: Self-pay | Admitting: Ophthalmology

## 2021-08-15 ENCOUNTER — Other Ambulatory Visit: Payer: Self-pay

## 2021-08-15 ENCOUNTER — Ambulatory Visit (INDEPENDENT_AMBULATORY_CARE_PROVIDER_SITE_OTHER): Payer: Medicare Other

## 2021-08-15 DIAGNOSIS — Z953 Presence of xenogenic heart valve: Secondary | ICD-10-CM

## 2021-08-15 DIAGNOSIS — I4821 Permanent atrial fibrillation: Secondary | ICD-10-CM | POA: Diagnosis not present

## 2021-08-15 DIAGNOSIS — Z5181 Encounter for therapeutic drug level monitoring: Secondary | ICD-10-CM

## 2021-08-15 DIAGNOSIS — Z952 Presence of prosthetic heart valve: Secondary | ICD-10-CM

## 2021-08-15 LAB — POCT INR: INR: 3.6 — AB (ref 2.0–3.0)

## 2021-08-15 NOTE — Patient Instructions (Signed)
-   skip warfarin tonight,, then - continue taking warfarin 1 tablet daily except 1.5 tablets on Sundays.  - Recheck INR in 3 weeks.  Coumadin Clinic [phone].

## 2021-09-02 ENCOUNTER — Other Ambulatory Visit: Payer: Self-pay

## 2021-09-02 ENCOUNTER — Encounter (INDEPENDENT_AMBULATORY_CARE_PROVIDER_SITE_OTHER): Payer: Self-pay | Admitting: Ophthalmology

## 2021-09-02 ENCOUNTER — Ambulatory Visit (INDEPENDENT_AMBULATORY_CARE_PROVIDER_SITE_OTHER): Payer: Medicare Other | Admitting: Ophthalmology

## 2021-09-02 DIAGNOSIS — H353211 Exudative age-related macular degeneration, right eye, with active choroidal neovascularization: Secondary | ICD-10-CM

## 2021-09-02 DIAGNOSIS — H353113 Nonexudative age-related macular degeneration, right eye, advanced atrophic without subfoveal involvement: Secondary | ICD-10-CM

## 2021-09-02 DIAGNOSIS — H353223 Exudative age-related macular degeneration, left eye, with inactive scar: Secondary | ICD-10-CM

## 2021-09-02 MED ORDER — RANIBIZUMAB 0.5 MG/0.05ML IZ SOSY
0.5000 mg | PREFILLED_SYRINGE | INTRAVITREAL | Status: AC | PRN
  Administered 2021-09-02: .5 mg via INTRAVITREAL

## 2021-09-02 NOTE — Assessment & Plan Note (Signed)
Subfoveal scarring left eye by OCT confirms the reason for poor acuity, no disease activity

## 2021-09-02 NOTE — Assessment & Plan Note (Signed)
Chronic active subfoveal disease even disciform scar now supplying the foveal region with preserved good acuity, stable only at 6-week interval.  Repeat injection Lucentis today and follow-up again in 6 weeks

## 2021-09-02 NOTE — Progress Notes (Signed)
09/02/2021     CHIEF COMPLAINT Patient presents for  Chief Complaint  Patient presents with   Retina Follow Up    5 week fu OD and Lucentis OD Pt states VA OU stable since last visit. Pt denies FOL, floaters, or ocular pain OU.  Pt reports using Alphagan BID OU       HISTORY OF PRESENT ILLNESS: Jason [name] is a 87 y.o. male who presents to the clinic today for:   HPI     Retina Follow Up   Patient presents with  Wet AMD.  In right eye.  This started 6 weeks ago.  Severity is mild.  Duration of 6 weeks.  Since onset it is stable. Additional comments: 5 week fu OD and Lucentis OD Pt states VA OU stable since last visit. Pt denies FOL, floaters, or ocular pain OU.  Pt reports using Alphagan BID OU         Comments   6 week fu OD oct lucentis OD. Patient states vision is stable and unchanged since last visit. Denies any new floaters or FOL. Pt uses Alphagan BID OU.      Last edited by Laurin Coder on 09/02/2021 10:10 AM.      Referring physician: Shirline Frees, MD [address] [place],  [place] [id number]  HISTORICAL INFORMATION:   Selected notes from the MEDICAL RECORD NUMBER    Lab Results  Component Value Date   HGBA1C 5.4 08/11/2013     CURRENT MEDICATIONS: Current Outpatient Medications (Ophthalmic Drugs)  Medication Sig   ALPHAGAN P 0.1 % SOLN INSTILL 1 DROP INTO BOTH EYES TWICE A DAY   No current facility-administered medications for this visit. (Ophthalmic Drugs)   Current Outpatient Medications (Other)  Medication Sig   acetaminophen (TYLENOL) 650 MG CR tablet Take 650 mg by mouth 2 (two) times daily.    Ascorbic Acid (VITAMIN C) 1000 MG tablet Take 1,000 mg by mouth daily.   atorvastatin (LIPITOR) 20 MG tablet Take 20 mg by mouth at bedtime.    b complex vitamins tablet Take 1 tablet by mouth daily.   diltiazem (CARDIZEM CD) 120 MG 24 hr capsule TAKE 1 CAPSULE BY MOUTH EVERY DAY   furosemide (LASIX) 40 MG tablet 40 mg  in the am and 20 mg in the pm   lisinopril (ZESTRIL) 10 MG tablet Take 10 mg by mouth daily.   metoprolol succinate (TOPROL-XL) 25 MG 24 hr tablet TAKE 1/2 TABLETS (12.5 MG TOTAL) BY MOUTH IN THE MORNING AND AT BEDTIME.   Multiple Vitamins-Minerals (PRESERVISION AREDS PO) Take 1 tablet by mouth 2 (two) times daily.    warfarin (COUMADIN) 5 MG tablet TAKE AS DIRECTED BY COUMADIN CLINIC   No current facility-administered medications for this visit. (Other)      REVIEW OF SYSTEMS:    ALLERGIES Allergies  Allergen Reactions   Penicillins Rash and Other (See Comments)    Has patient had a PCN reaction causing immediate rash, facial/tongue/throat swelling, SOB or lightheadedness with hypotension: # # Yes # # Has patient had a PCN reaction causing severe rash involving mucus membranes or skin necrosis: No Has patient had a PCN reaction that required hospitalization: No Has patient had a PCN reaction occurring within the last 10 years: No If all of the above answers are "NO", then may proceed with Cephalosporin use.     PAST MEDICAL HISTORY Past Medical History:  Diagnosis Date   AK (actinic keratosis)  Atrial fibrillation, persistent (HCC)    chronic coumadin therapy   Bifascicular block    Blindness    left eye   CKD (chronic kidney disease), stage III (HCC)    Coronary artery disease    20% RCA by 2014 cath   Elevated PSA    GERD (gastroesophageal reflux disease)    H/O hiatal hernia    Heart murmur    History of kidney stones    Hydrocele    left   Hyperlipidemia    Hypertension    Internal hemorrhoids    Kidney stones    kidney stones   Macular degeneration    right eye   Mitral regurgitation 02/25/2011   Recurrent MR s/p mitral valve repair then bioprosthetic MVR 2014   OA (osteoarthritis) of neck    S/P aortic valve replacement with bioprosthetic valve 08/15/2013   56mm Edwards Magna Ease bovine pericardial tissue valve   S/P mitral valve repair 09/15/2004    Complex valvuloplasty including artificial Goretex neocord placement x4 and 13mm SARP ring annuloplasty via right mini thoracotomy approach - Dr Evelina Dun @ [facility]   S/P redo mitral valve replacement and aortic valve replacement with bioprosthetic valves 08/15/2013   60mm Edwards Magna Mitral bovine pericardial tissue valve   Severe aortic stenosis 11/14   tissue AVR    Vocal cord paralysis    Past Surgical History:  Procedure Laterality Date   AORTIC VALVE REPLACEMENT N/A 08/15/2013   Procedure: AORTIC VALVE REPLACEMENT (AVR);  Surgeon: Rexene Alberts, MD;  Location: [place];  Service: Open Heart Surgery;  Laterality: N/A;   CATARACT EXTRACTION Bilateral 2011   Dr. Bing Plume   ESOPHAGOGASTRODUODENOSCOPY N/A 08/02/2013   Procedure: ESOPHAGOGASTRODUODENOSCOPY (EGD);  Surgeon: Casandra Doffing, MD;  Location: [facility] ENDOSCOPY;  Service: Cardiovascular;  Laterality: N/A;   EYE SURGERY Right    infection   GROIN MASS OPEN BIOPSY  2012   HEMORRHOID SURGERY     pt denies.   INGUINAL HERNIA REPAIR  08/2011   INTRAOPERATIVE TRANSESOPHAGEAL ECHOCARDIOGRAM N/A 08/15/2013   Procedure: INTRAOPERATIVE TRANSESOPHAGEAL ECHOCARDIOGRAM;  Surgeon: Rexene Alberts, MD;  Location: [place];  Service: Open Heart Surgery;  Laterality: N/A;   LEFT AND RIGHT HEART CATHETERIZATION WITH CORONARY ANGIOGRAM N/A 07/05/2013   Procedure: LEFT AND RIGHT HEART CATHETERIZATION WITH CORONARY ANGIOGRAM;  Surgeon: Jettie Booze, MD;  Location: [facility] CATH LAB;  Service: Cardiovascular;  Laterality: N/A;   MASS EXCISION Right 03/30/2018   Procedure: EXCISION RIGHT FLANK  MASS;  Surgeon: Coralie Keens, MD;  Location: [place];  Service: General;  Laterality: Right;   MITRAL VALVE REPAIR  09/16/2011   @ [facility], Dr Evelina Dun   MITRAL VALVE REPAIR N/A 08/15/2013   Procedure: REDO MITRAL VALVE (MV) REPLACEMENT;  Surgeon: Rexene Alberts, MD;  Location: [place];  Service: Open Heart Surgery;  Laterality: N/A;   TEE WITHOUT CARDIOVERSION N/A 07/05/2013    Procedure: TRANSESOPHAGEAL ECHOCARDIOGRAM (TEE);  Surgeon: Casandra Doffing, MD;  Location: [facility] ENDOSCOPY;  Service: Cardiovascular;  Laterality: N/A;   TEE WITHOUT CARDIOVERSION N/A 08/02/2013   Procedure: TRANSESOPHAGEAL ECHOCARDIOGRAM (TEE);  Surgeon: Casandra Doffing, MD;  Location: [place];  Service: Cardiovascular;  Laterality: N/A;   UMBILICAL HERNIA REPAIR N/A 03/30/2018   Procedure: HERNIA REPAIR UMBILICAL;  Surgeon: Coralie Keens, MD;  Location: [place];  Service: General;  Laterality: N/A;    FAMILY HISTORY Family History  Problem Relation Age of Onset   Cancer Father        colon  Hypertension Father    Diabetes Father    Hyperlipidemia Father    Stroke Father    Hypertension Mother    Diabetes Mother    Hyperlipidemia Mother    Stroke Mother    Heart attack Mother    Hypertension Brother        MI, S/P MVR    SOCIAL HISTORY Social History   Tobacco Use   Smoking status: Former    Years: 8.00    Types: Cigarettes    Quit date: 10/12/1978    Years since quitting: 42.9   Smokeless tobacco: Never  Vaping Use   Vaping Use: Never used  Substance Use Topics   Alcohol use: No   Drug use: No         OPHTHALMIC EXAM:  Base Eye Exam     Visual Acuity (ETDRS)       Right Left   Dist cc 20/25 20/400   Dist ph cc  NI         Tonometry (Tonopen, 10:15 AM)       Right Left   Pressure 17 13         Pupils       Pupils Dark Light Shape APD   Right PERRL 3 3 Round None   Left PERRL 3 3 Round None         Extraocular Movement       Right Left    Full Full         Neuro/Psych     Oriented x3: Yes   Mood/Affect: Normal         Dilation     Both eyes: 1.0% Mydriacyl, 2.5% Phenylephrine @ 10:15 AM           Slit Lamp and Fundus Exam     External Exam       Right Left   External Normal Normal         Slit Lamp Exam       Right Left   Lids/Lashes Normal Normal   Conjunctiva/Sclera White and quiet White and quiet    Cornea Clear Clear   Anterior Chamber Deep and quiet Deep and quiet   Iris Round and reactive Round and reactive   Lens Posterior chamber intraocular lens Posterior chamber intraocular lens   Anterior Vitreous Normal Normal         Fundus Exam       Right Left   Posterior Vitreous Posterior vitreous detachment    Disc Peripapillary atrophy    C/D Ratio 0.8    Macula Advanced age related macular degeneration, Atrophy, Retinal pigment epithelial atrophy, Disciform scar, Drusen, Mottling, Retinal pigment epithelial mottling, Retinal pigment epithelial atrophy - geographic, mature CNVM, now with subretinal hemorrhage ringing the fovea and inferiorly, Geographic atrophy SUP to FAZ    Vessels Normal, , no DR    Periphery Normal             IMAGING AND PROCEDURES  Imaging and Procedures for 09/02/21  Intravitreal Injection, Pharmacologic Agent - OD - Right Eye       Time Out 09/02/2021. 10:28 AM. Confirmed correct patient, procedure, site, and patient consented.   Anesthesia Topical anesthesia was used. Anesthetic medications included Lidocaine 4%.   Procedure Preparation included 10% betadine to eyelids, 5% betadine to ocular surface. A 30 gauge needle was used.   Injection: 0.5 mg Ranibizumab 0.5 MG/0.05ML   Route: Intravitreal, Site: Right Eye   NDC: [id number], Lot: [id number], Waste:  0 mL   Post-op Post injection exam found visual acuity of at least counting fingers. The patient tolerated the procedure well. There were no complications. The patient received written and verbal post procedure care education. Post injection medications included ocuflox.      OCT, Retina - OU - Both Eyes       Right Eye Quality was good. Scan locations included subfoveal. Central Foveal Thickness: 323. Progression has been stable. Findings include cystoid macular edema, pigment epithelial detachment, disciform scar, intraretinal fluid, subretinal fluid.   Left Eye Quality was good.  Scan locations included subfoveal. Central Foveal Thickness: 308. Progression has been stable. Findings include disciform scar, central retinal atrophy, outer retinal atrophy, inner retinal atrophy.   Notes OD, chronically active yet stable, for over 16 years in this office currently at 6-week interval with resistant to prior medications  Intraretinal fluid OD superotemporally, stable over time due to mature subfoveal fibrotic CNVM.  Subretinal hyper reflective material appears to be correlating with subretinal hemorrhage             ASSESSMENT/PLAN:  Exudative age-related macular degeneration of right eye with active choroidal neovascularization (HCC) Chronic active subfoveal disease even disciform scar now supplying the foveal region with preserved good acuity, stable only at 6-week interval.  Repeat injection Lucentis today and follow-up again in 6 weeks  Exudative age-related macular degeneration of left eye with inactive scar (HCC) Subfoveal scarring left eye by OCT confirms the reason for poor acuity, no disease activity  Advanced nonexudative age-related macular degeneration of right eye without subfoveal involvement Perifoveal disease     ICD-10-CM   1. Exudative age-related macular degeneration of right eye with active choroidal neovascularization (HCC)  H35.3211 Intravitreal Injection, Pharmacologic Agent - OD - Right Eye    OCT, Retina - OU - Both Eyes    Ranibizumab SOSY 0.5 mg    2. Exudative age-related macular degeneration of left eye with inactive scar ([place])  H35.3223     3. Advanced nonexudative age-related macular degeneration of right eye without subfoveal involvement  H35.3113       1.  Chronic active subfoveal disciform scar with preserved acuity OD.  Treat resistance to Eylea and Avastin in the past.  Chronically controlled and no reactivation only on Lucentis 0.5 mg every 6 weeks.  Repeat injection today  2.  3.  Ophthalmic Meds Ordered this visit:   Meds ordered this encounter  Medications   Ranibizumab SOSY 0.5 mg       Return in about 6 weeks (around 10/14/2021) for DILATE OU, LUCENTIS 0.5 OCT, OD.  There are no Patient Instructions on file for this visit.   Explained the diagnoses, plan, and follow up with the patient and they expressed understanding.  Patient expressed understanding of the importance of proper follow up care.   Clent Demark Sandon Yoho M.D. Diseases & Surgery of the Retina and Vitreous [facility] 09/02/21     Abbreviations: M myopia (nearsighted); A astigmatism; H hyperopia (farsighted); P presbyopia; Mrx spectacle prescription;  CTL contact lenses; OD right eye; OS left eye; OU both eyes  XT exotropia; ET esotropia; PEK punctate epithelial keratitis; PEE punctate epithelial erosions; DES dry eye syndrome; MGD meibomian gland dysfunction; ATs artificial tears; PFAT's preservative free artificial tears; [place] nuclear sclerotic cataract; PSC posterior subcapsular cataract; ERM epi-retinal membrane; PVD posterior vitreous detachment; RD retinal detachment; DM diabetes mellitus; DR diabetic retinopathy; NPDR non-proliferative diabetic retinopathy; PDR proliferative diabetic retinopathy; CSME clinically significant macular edema;  DME diabetic macular edema; dbh dot blot hemorrhages; CWS cotton wool spot; POAG primary open angle glaucoma; C/D cup-to-disc ratio; HVF humphrey visual field; GVF goldmann visual field; OCT optical coherence tomography; IOP intraocular pressure; BRVO Branch retinal vein occlusion; CRVO central retinal vein occlusion; CRAO central retinal artery occlusion; BRAO branch retinal artery occlusion; RT retinal tear; SB scleral buckle; PPV pars plana vitrectomy; VH Vitreous hemorrhage; PRP panretinal laser photocoagulation; IVK intravitreal kenalog; VMT vitreomacular traction; MH Macular hole;  NVD neovascularization of the disc; NVE neovascularization elsewhere; AREDS age related eye disease  study; ARMD age related macular degeneration; POAG primary open angle glaucoma; EBMD epithelial/anterior basement membrane dystrophy; ACIOL anterior chamber intraocular lens; IOL intraocular lens; PCIOL posterior chamber intraocular lens; Phaco/IOL phacoemulsification with intraocular lens placement; [place] photorefractive keratectomy; LASIK laser assisted in situ keratomileusis; HTN hypertension; DM diabetes mellitus; COPD chronic obstructive pulmonary disease

## 2021-09-02 NOTE — Assessment & Plan Note (Signed)
Perifoveal disease

## 2021-09-09 ENCOUNTER — Other Ambulatory Visit: Payer: Self-pay

## 2021-09-09 ENCOUNTER — Ambulatory Visit (INDEPENDENT_AMBULATORY_CARE_PROVIDER_SITE_OTHER): Payer: Medicare Other | Admitting: *Deleted

## 2021-09-09 DIAGNOSIS — Z952 Presence of prosthetic heart valve: Secondary | ICD-10-CM | POA: Diagnosis not present

## 2021-09-09 DIAGNOSIS — Z5181 Encounter for therapeutic drug level monitoring: Secondary | ICD-10-CM | POA: Diagnosis not present

## 2021-09-09 DIAGNOSIS — I4821 Permanent atrial fibrillation: Secondary | ICD-10-CM | POA: Diagnosis not present

## 2021-09-09 DIAGNOSIS — Z953 Presence of xenogenic heart valve: Secondary | ICD-10-CM | POA: Diagnosis not present

## 2021-09-09 LAB — POCT INR: INR: 3.7 — AB (ref 2.0–3.0)

## 2021-09-09 NOTE — Patient Instructions (Signed)
Description   Do not take any Warfarin today then start taking Warfarin 1 tablet daily. Recheck INR in 3 weeks. Coumadin Clinic [phone].

## 2021-10-01 ENCOUNTER — Other Ambulatory Visit: Payer: Self-pay

## 2021-10-01 ENCOUNTER — Ambulatory Visit (INDEPENDENT_AMBULATORY_CARE_PROVIDER_SITE_OTHER): Payer: Medicare Other

## 2021-10-01 DIAGNOSIS — Z5181 Encounter for therapeutic drug level monitoring: Secondary | ICD-10-CM | POA: Diagnosis not present

## 2021-10-01 DIAGNOSIS — Z953 Presence of xenogenic heart valve: Secondary | ICD-10-CM

## 2021-10-01 DIAGNOSIS — I4821 Permanent atrial fibrillation: Secondary | ICD-10-CM

## 2021-10-01 DIAGNOSIS — Z952 Presence of prosthetic heart valve: Secondary | ICD-10-CM

## 2021-10-01 LAB — POCT INR: INR: 2.5 (ref 2.0–3.0)

## 2021-10-01 NOTE — Patient Instructions (Signed)
Description   Continue taking Warfarin 1 tablet daily. Recheck INR in 5 weeks.  Coumadin Clinic [phone]      

## 2021-10-14 ENCOUNTER — Encounter (INDEPENDENT_AMBULATORY_CARE_PROVIDER_SITE_OTHER): Payer: Medicare Other | Admitting: Ophthalmology

## 2021-10-15 ENCOUNTER — Other Ambulatory Visit: Payer: Self-pay

## 2021-10-15 ENCOUNTER — Ambulatory Visit (INDEPENDENT_AMBULATORY_CARE_PROVIDER_SITE_OTHER): Payer: Medicare Other | Admitting: Ophthalmology

## 2021-10-15 ENCOUNTER — Encounter (INDEPENDENT_AMBULATORY_CARE_PROVIDER_SITE_OTHER): Payer: Self-pay | Admitting: Ophthalmology

## 2021-10-15 DIAGNOSIS — H353211 Exudative age-related macular degeneration, right eye, with active choroidal neovascularization: Secondary | ICD-10-CM

## 2021-10-15 DIAGNOSIS — H353113 Nonexudative age-related macular degeneration, right eye, advanced atrophic without subfoveal involvement: Secondary | ICD-10-CM | POA: Diagnosis not present

## 2021-10-15 DIAGNOSIS — H353223 Exudative age-related macular degeneration, left eye, with inactive scar: Secondary | ICD-10-CM | POA: Diagnosis not present

## 2021-10-15 MED ORDER — RANIBIZUMAB 0.5 MG/0.05ML IZ SOSY
0.5000 mg | PREFILLED_SYRINGE | INTRAVITREAL | Status: AC | PRN
  Administered 2021-10-15: .5 mg via INTRAVITREAL

## 2021-10-15 NOTE — Assessment & Plan Note (Signed)
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

## 2021-10-15 NOTE — Assessment & Plan Note (Signed)
No change 

## 2021-10-15 NOTE — Progress Notes (Signed)
10/15/2021     CHIEF COMPLAINT Patient presents for  Chief Complaint  Patient presents with   Retina Follow Up    5 week fu OD and Lucentis OD Pt states VA OU stable since last visit. Pt denies FOL, floaters, or ocular pain OU.  Pt reports using Alphagan BID OU       HISTORY OF PRESENT ILLNESS: Jason [name] is a 88 y.o. male who presents to the clinic today for:   HPI     Retina Follow Up           Diagnosis: Wet AMD   Laterality: right eye   Onset: 6 weeks ago   Severity: mild   Duration: 6 weeks   Course: stable   Comments: 5 week fu OD and Lucentis OD Pt states VA OU stable since last visit. Pt denies FOL, floaters, or ocular pain OU.  Pt reports using Alphagan BID OU          Comments   6 week fu Dilate OU, lucentis 0.5 OD, OCT. Patient states vision is stable and unchanged since last visit. Denies any new floaters or FOL.       Last edited by Laurin Coder on 10/15/2021 10:43 AM.      Referring physician: Shirline Frees, MD [place],  [place] [id number]  HISTORICAL INFORMATION:   Selected notes from the MEDICAL RECORD NUMBER    Lab Results  Component Value Date   HGBA1C 5.4 08/11/2013     CURRENT MEDICATIONS: Current Outpatient Medications (Ophthalmic Drugs)  Medication Sig   ALPHAGAN P 0.1 % SOLN INSTILL 1 DROP INTO BOTH EYES TWICE A DAY   No current facility-administered medications for this visit. (Ophthalmic Drugs)   Current Outpatient Medications (Other)  Medication Sig   acetaminophen (TYLENOL) 650 MG CR tablet Take 650 mg by mouth 2 (two) times daily.    Ascorbic Acid (VITAMIN C) 1000 MG tablet Take 1,000 mg by mouth daily.   atorvastatin (LIPITOR) 20 MG tablet Take 20 mg by mouth at bedtime.    b complex vitamins tablet Take 1 tablet by mouth daily.   diltiazem (CARDIZEM CD) 120 MG 24 hr capsule TAKE 1 CAPSULE BY MOUTH EVERY DAY   furosemide (LASIX) 40 MG tablet 40 mg in the am and 20 mg in the pm    lisinopril (ZESTRIL) 10 MG tablet Take 10 mg by mouth daily.   metoprolol succinate (TOPROL-XL) 25 MG 24 hr tablet TAKE 1/2 TABLETS (12.5 MG TOTAL) BY MOUTH IN THE MORNING AND AT BEDTIME.   Multiple Vitamins-Minerals (PRESERVISION AREDS PO) Take 1 tablet by mouth 2 (two) times daily.    warfarin (COUMADIN) 5 MG tablet TAKE AS DIRECTED BY COUMADIN CLINIC   No current facility-administered medications for this visit. (Other)      REVIEW OF SYSTEMS:    ALLERGIES Allergies  Allergen Reactions   Penicillins Rash and Other (See Comments)    Has patient had a PCN reaction causing immediate rash, facial/tongue/throat swelling, SOB or lightheadedness with hypotension: # # Yes # # Has patient had a PCN reaction causing severe rash involving mucus membranes or skin necrosis: No Has patient had a PCN reaction that required hospitalization: No Has patient had a PCN reaction occurring within the last 10 years: No If all of the above answers are "NO", then may proceed with Cephalosporin use.     PAST MEDICAL HISTORY Past Medical History:  Diagnosis Date  AK (actinic keratosis)    Atrial fibrillation, persistent (HCC)    chronic coumadin therapy   Bifascicular block    Blindness    left eye   CKD (chronic kidney disease), stage III (HCC)    Coronary artery disease    20% RCA by 2014 cath   Elevated PSA    GERD (gastroesophageal reflux disease)    H/O hiatal hernia    Heart murmur    History of kidney stones    Hydrocele    left   Hyperlipidemia    Hypertension    Internal hemorrhoids    Kidney stones    kidney stones   Macular degeneration    right eye   Mitral regurgitation 02/25/2011   Recurrent MR s/p mitral valve repair then bioprosthetic MVR 2014   OA (osteoarthritis) of neck    S/P aortic valve replacement with bioprosthetic valve 08/15/2013   32mm Edwards Magna Ease bovine pericardial tissue valve   S/P mitral valve repair 09/15/2004   Complex valvuloplasty  including artificial Goretex neocord placement x4 and 70mm SARP ring annuloplasty via right mini thoracotomy approach - Dr Evelina Dun @ [facility]   S/P redo mitral valve replacement and aortic valve replacement with bioprosthetic valves 08/15/2013   75mm Edwards Magna Mitral bovine pericardial tissue valve   Severe aortic stenosis 11/14   tissue AVR    Vocal cord paralysis    Past Surgical History:  Procedure Laterality Date   AORTIC VALVE REPLACEMENT N/A 08/15/2013   Procedure: AORTIC VALVE REPLACEMENT (AVR);  Surgeon: Rexene Alberts, MD;  Location: [place];  Service: Open Heart Surgery;  Laterality: N/A;   CATARACT EXTRACTION Bilateral 2011   Dr. Bing Plume   ESOPHAGOGASTRODUODENOSCOPY N/A 08/02/2013   Procedure: ESOPHAGOGASTRODUODENOSCOPY (EGD);  Surgeon: Casandra Doffing, MD;  Location: [facility] ENDOSCOPY;  Service: Cardiovascular;  Laterality: N/A;   EYE SURGERY Right    infection   GROIN MASS OPEN BIOPSY  2012   HEMORRHOID SURGERY     pt denies.   INGUINAL HERNIA REPAIR  08/2011   INTRAOPERATIVE TRANSESOPHAGEAL ECHOCARDIOGRAM N/A 08/15/2013   Procedure: INTRAOPERATIVE TRANSESOPHAGEAL ECHOCARDIOGRAM;  Surgeon: Rexene Alberts, MD;  Location: [place];  Service: Open Heart Surgery;  Laterality: N/A;   LEFT AND RIGHT HEART CATHETERIZATION WITH CORONARY ANGIOGRAM N/A 07/05/2013   Procedure: LEFT AND RIGHT HEART CATHETERIZATION WITH CORONARY ANGIOGRAM;  Surgeon: Jettie Booze, MD;  Location: [facility] CATH LAB;  Service: Cardiovascular;  Laterality: N/A;   MASS EXCISION Right 03/30/2018   Procedure: EXCISION RIGHT FLANK  MASS;  Surgeon: Coralie Keens, MD;  Location: [place];  Service: General;  Laterality: Right;   MITRAL VALVE REPAIR  09/16/2011   @ [facility], Dr Evelina Dun   MITRAL VALVE REPAIR N/A 08/15/2013   Procedure: REDO MITRAL VALVE (MV) REPLACEMENT;  Surgeon: Rexene Alberts, MD;  Location: [place];  Service: Open Heart Surgery;  Laterality: N/A;   TEE WITHOUT CARDIOVERSION N/A 07/05/2013   Procedure: TRANSESOPHAGEAL  ECHOCARDIOGRAM (TEE);  Surgeon: Casandra Doffing, MD;  Location[facility];  Service: Cardiovascular;  Laterality: N/A;   TEE WITHOUT CARDIOVERSION N/A 08/02/2013   Procedure: TRANSESOPHAGEAL ECHOCARDIOGRAM (TEE);  Surgeon: Casandra Doffing, MD;  Location: [place];  Service: Cardiovascular;  Laterality: N/A;   UMBILICAL HERNIA REPAIR N/A 03/30/2018   Procedure: HERNIA REPAIR UMBILICAL;  Surgeon: Coralie Keens, MD;  Location: [place];  Service: General;  Laterality: N/A;    FAMILY HISTORY Family History  Problem Relation Age of Onset   Cancer Father  colon   Hypertension Father    Diabetes Father    Hyperlipidemia Father    Stroke Father    Hypertension Mother    Diabetes Mother    Hyperlipidemia Mother    Stroke Mother    Heart attack Mother    Hypertension Brother        MI, S/P MVR    SOCIAL HISTORY Social History   Tobacco Use   Smoking status: Former    Years: 8.00    Types: Cigarettes    Quit date: 10/12/1978    Years since quitting: 43.0   Smokeless tobacco: Never  Vaping Use   Vaping Use: Never used  Substance Use Topics   Alcohol use: No   Drug use: No         OPHTHALMIC EXAM:  Base Eye Exam     Visual Acuity (ETDRS)       Right Left   Dist cc 20/25 20/400   Dist ph cc  NI    Correction: Glasses         Tonometry (Tonopen, 10:47 AM)       Right Left   Pressure 27 22         Tonometry #2 (Tonopen, 10:47 AM)       Right Left   Pressure 26 18         Pupils       Pupils Dark Light APD   Right PERRL 4 3 None   Left PERRL 4 3 None         Extraocular Movement       Right Left    Full Full         Neuro/Psych     Oriented x3: Yes   Mood/Affect: Normal         Dilation     Both eyes: 1.0% Mydriacyl, 2.5% Phenylephrine @ 10:45 AM           Slit Lamp and Fundus Exam     External Exam       Right Left   External Normal Normal         Slit Lamp Exam       Right Left   Lids/Lashes Normal Normal    Conjunctiva/Sclera White and quiet White and quiet   Cornea Clear Clear   Anterior Chamber Deep and quiet Deep and quiet   Iris Round and reactive Round and reactive   Lens Posterior chamber intraocular lens Posterior chamber intraocular lens   Anterior Vitreous Normal Normal         Fundus Exam       Right Left   Posterior Vitreous Posterior vitreous detachment Posterior vitreous detachment   Disc Peripapillary atrophy Peripapillary atrophy   C/D Ratio 0.8 0.8   Macula Advanced age related macular degeneration, Atrophy, Retinal pigment epithelial atrophy, Disciform scar, Drusen, Mottling, Retinal pigment epithelial mottling, Retinal pigment epithelial atrophy - geographic, mature CNVM, now with subretinal hemorrhage ringing the fovea and inferiorly, Geographic atrophy SUP to FAZ Advanced age related macular degeneration, Atrophy, Retinal pigment epithelial atrophy, Disciform scar, Drusen, Mottling, Retinal pigment epithelial mottling, Retinal pigment epithelial atrophy - geographic, Geographic atrophy SUP to FAZ   Vessels Normal, , no DR Normal, , no DR   Periphery Normal Normal            IMAGING AND PROCEDURES  Imaging and Procedures for 10/15/21  Intravitreal Injection, Pharmacologic Agent - OD - Right Eye       Time Out  10/15/2021. 11:37 AM. Confirmed correct patient, procedure, site, and patient consented.   Anesthesia Topical anesthesia was used. Anesthetic medications included Lidocaine 4%.   Procedure Preparation included 10% betadine to eyelids, 5% betadine to ocular surface. A 30 gauge needle was used.   Injection: 0.5 mg Ranibizumab 0.5 MG/0.05ML   Route: Intravitreal, Site: Right Eye   NDC: [id number], Lot: [id number], Waste: 0 mL   Post-op Post injection exam found visual acuity of at least counting fingers. The patient tolerated the procedure well. There were no complications. The patient received written and verbal post procedure care education. Post  injection medications included ocuflox.      OCT, Retina - OU - Both Eyes       Right Eye Quality was good. Scan locations included subfoveal. Central Foveal Thickness: 335. Progression has been stable. Findings include cystoid macular edema, pigment epithelial detachment, disciform scar, subretinal fluid, intraretinal fluid.   Left Eye Quality was good. Scan locations included subfoveal. Central Foveal Thickness: 305. Progression has been stable. Findings include disciform scar, central retinal atrophy, outer retinal atrophy, inner retinal atrophy.   Notes OD, chronically active yet stable, for over 16 years in this office currently at 6-week interval with resistant to prior medications  Intraretinal fluid OD superotemporally stable over time, stable over time due to mature subfoveal fibrotic CNVM.  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  Subfoveal disciform scar.  No active edges clinically yet still by OCT intraretinal fluid superotemporal to FAZ      ICD-10-CM   1. Exudative age-related macular degeneration of right eye with active choroidal neovascularization (HCC)  H35.3211 Intravitreal Injection, Pharmacologic Agent - OD - Right Eye    OCT, Retina - OU - Both Eyes    Ranibizumab SOSY 0.5 mg    2. Advanced nonexudative age-related macular degeneration of right eye without subfoveal involvement  H35.3113     3. Exudative age-related macular degeneration of left eye with inactive scar ([place])   H35.3223       1.  Repeat injection intravitreal Lucentis today, currently at 6-week interval.  Repeat injection again in 6 weeks likely as chronic recurrences in the past without therapy  2.  3.  Ophthalmic Meds Ordered this visit:  Meds ordered this encounter  Medications   Ranibizumab SOSY 0.5 mg       Return in about 6 weeks (around 11/26/2021) for dilate, OD, LUCENTIS 0.5 OCT.  There are no Patient Instructions on file for this visit.   Explained the diagnoses, plan, and follow up with the patient and they expressed understanding.  Patient expressed understanding of the importance of proper follow up care.   Clent Demark Dru Laurel M.D. Diseases & Surgery of the Retina and Vitreous [facility] [place] 10/15/21     Abbreviations: M myopia (nearsighted); A astigmatism; H hyperopia (farsighted); P presbyopia; Mrx spectacle prescription;  CTL contact lenses; OD right eye; OS left eye; OU both eyes  XT exotropia; ET esotropia; PEK punctate epithelial keratitis; PEE punctate epithelial erosions; DES dry eye syndrome; MGD meibomian gland dysfunction; ATs artificial  tears; PFAT's preservative free artificial tears; [place] nuclear sclerotic cataract; PSC posterior subcapsular cataract; ERM epi-retinal membrane; PVD posterior vitreous detachment; RD retinal detachment; DM diabetes mellitus; DR diabetic retinopathy; NPDR non-proliferative diabetic retinopathy; PDR proliferative diabetic retinopathy; CSME clinically significant macular edema; DME diabetic macular edema; dbh dot blot hemorrhages; CWS cotton wool spot; POAG primary open angle glaucoma; C/D cup-to-disc ratio; HVF humphrey visual field; GVF goldmann visual field; OCT optical coherence tomography; IOP intraocular pressure; BRVO Branch retinal vein occlusion; CRVO central retinal vein occlusion; CRAO central retinal artery occlusion; BRAO branch retinal artery occlusion; RT retinal tear; SB scleral buckle; PPV pars plana vitrectomy;  VH Vitreous hemorrhage; PRP panretinal laser photocoagulation; IVK intravitreal kenalog; VMT vitreomacular traction; MH Macular hole;  NVD neovascularization of the disc; NVE neovascularization elsewhere; AREDS age related eye disease study; ARMD age related macular degeneration; POAG primary open angle glaucoma; EBMD epithelial/anterior basement membrane dystrophy; ACIOL anterior chamber intraocular lens; IOL intraocular lens; PCIOL posterior chamber intraocular lens; Phaco/IOL phacoemulsification with intraocular lens placement; [place] photorefractive keratectomy; LASIK laser assisted in situ keratomileusis; HTN hypertension; DM diabetes mellitus; COPD chronic obstructive pulmonary disease

## 2021-10-15 NOTE — Assessment & Plan Note (Signed)
Stable today at 6-week follow-up interval on Lucentis.  Subfoveal disciform scar.  No active edges clinically yet still by OCT intraretinal fluid superotemporal to FAZ

## 2021-11-05 ENCOUNTER — Ambulatory Visit (INDEPENDENT_AMBULATORY_CARE_PROVIDER_SITE_OTHER): Payer: Medicare Other

## 2021-11-05 ENCOUNTER — Other Ambulatory Visit: Payer: Self-pay

## 2021-11-05 DIAGNOSIS — I4821 Permanent atrial fibrillation: Secondary | ICD-10-CM

## 2021-11-05 DIAGNOSIS — Z952 Presence of prosthetic heart valve: Secondary | ICD-10-CM | POA: Diagnosis not present

## 2021-11-05 DIAGNOSIS — Z953 Presence of xenogenic heart valve: Secondary | ICD-10-CM

## 2021-11-05 DIAGNOSIS — Z5181 Encounter for therapeutic drug level monitoring: Secondary | ICD-10-CM | POA: Diagnosis not present

## 2021-11-05 LAB — POCT INR: INR: 3.2 — AB (ref 2.0–3.0)

## 2021-11-05 NOTE — Patient Instructions (Signed)
Description   Skip today's dosage of Warfarin, then resume same dosage of Warfarin 1 tablet daily. Recheck INR in 4 weeks. Coumadin Clinic [phone].

## 2021-11-20 DIAGNOSIS — Z85828 Personal history of other malignant neoplasm of skin: Secondary | ICD-10-CM | POA: Diagnosis not present

## 2021-11-20 DIAGNOSIS — Z08 Encounter for follow-up examination after completed treatment for malignant neoplasm: Secondary | ICD-10-CM | POA: Diagnosis not present

## 2021-11-20 DIAGNOSIS — L821 Other seborrheic keratosis: Secondary | ICD-10-CM | POA: Diagnosis not present

## 2021-11-20 DIAGNOSIS — L814 Other melanin hyperpigmentation: Secondary | ICD-10-CM | POA: Diagnosis not present

## 2021-11-20 DIAGNOSIS — D225 Melanocytic nevi of trunk: Secondary | ICD-10-CM | POA: Diagnosis not present

## 2021-11-20 DIAGNOSIS — L570 Actinic keratosis: Secondary | ICD-10-CM | POA: Diagnosis not present

## 2021-11-20 DIAGNOSIS — L819 Disorder of pigmentation, unspecified: Secondary | ICD-10-CM | POA: Diagnosis not present

## 2021-11-20 DIAGNOSIS — L578 Other skin changes due to chronic exposure to nonionizing radiation: Secondary | ICD-10-CM | POA: Diagnosis not present

## 2021-11-26 ENCOUNTER — Ambulatory Visit (INDEPENDENT_AMBULATORY_CARE_PROVIDER_SITE_OTHER): Payer: Medicare Other | Admitting: Ophthalmology

## 2021-11-26 ENCOUNTER — Other Ambulatory Visit: Payer: Self-pay

## 2021-11-26 DIAGNOSIS — H353211 Exudative age-related macular degeneration, right eye, with active choroidal neovascularization: Secondary | ICD-10-CM

## 2021-11-26 DIAGNOSIS — H353223 Exudative age-related macular degeneration, left eye, with inactive scar: Secondary | ICD-10-CM

## 2021-11-26 MED ORDER — RANIBIZUMAB 0.5 MG/0.05ML IZ SOSY
0.5000 mg | PREFILLED_SYRINGE | INTRAVITREAL | Status: AC | PRN
  Administered 2021-11-26: .5 mg via INTRAVITREAL

## 2021-11-26 NOTE — Progress Notes (Signed)
11/26/2021     CHIEF COMPLAINT Patient presents for  Chief Complaint  Patient presents with   Macular Degeneration      HISTORY OF PRESENT ILLNESS: Jason [name] is a 88 y.o. male who presents to the clinic today for:   HPI   OD currently at 6-week follow-up post injection intravitreal Lucentis for chronic active CNVM with mature vessels and persistent leaking vessels off of therapy.  Acuity preserved Last edited by Hurman Horn, MD on 11/26/2021 10:51 AM.      Referring physician: Shirline Frees, MD [place],  [place] [address]  HISTORICAL INFORMATION:   Selected notes from the MEDICAL RECORD NUMBER    Lab Results  Component Value Date   HGBA1C 5.4 08/11/2013     CURRENT MEDICATIONS: Current Outpatient Medications (Ophthalmic Drugs)  Medication Sig   ALPHAGAN P 0.1 % SOLN INSTILL 1 DROP INTO BOTH EYES TWICE A DAY   No current facility-administered medications for this visit. (Ophthalmic Drugs)   Current Outpatient Medications (Other)  Medication Sig   acetaminophen (TYLENOL) 650 MG CR tablet Take 650 mg by mouth 2 (two) times daily.    Ascorbic Acid (VITAMIN C) 1000 MG tablet Take 1,000 mg by mouth daily.   atorvastatin (LIPITOR) 20 MG tablet Take 20 mg by mouth at bedtime.    b complex vitamins tablet Take 1 tablet by mouth daily.   diltiazem (CARDIZEM CD) 120 MG 24 hr capsule TAKE 1 CAPSULE BY MOUTH EVERY DAY   furosemide (LASIX) 40 MG tablet 40 mg in the am and 20 mg in the pm   lisinopril (ZESTRIL) 10 MG tablet Take 10 mg by mouth daily.   metoprolol succinate (TOPROL-XL) 25 MG 24 hr tablet TAKE 1/2 TABLETS (12.5 MG TOTAL) BY MOUTH IN THE MORNING AND AT BEDTIME.   Multiple Vitamins-Minerals (PRESERVISION AREDS PO) Take 1 tablet by mouth 2 (two) times daily.    warfarin (COUMADIN) 5 MG tablet TAKE AS DIRECTED BY COUMADIN CLINIC   No current facility-administered medications for this visit. (Other)      REVIEW OF  SYSTEMS: ROS   Negative for: Constitutional, Gastrointestinal, Neurological, Skin, Genitourinary, Musculoskeletal, HENT, Endocrine, Cardiovascular, Eyes, Respiratory, Psychiatric, Allergic/Imm, Heme/Lymph Last edited by Hurman Horn, MD on 11/26/2021 10:51 AM.       ALLERGIES Allergies  Allergen Reactions   Penicillins Rash and Other (See Comments)    Has patient had a PCN reaction causing immediate rash, facial/tongue/throat swelling, SOB or lightheadedness with hypotension: # # Yes # # Has patient had a PCN reaction causing severe rash involving mucus membranes or skin necrosis: No Has patient had a PCN reaction that required hospitalization: No Has patient had a PCN reaction occurring within the last 10 years: No If all of the above answers are "NO", then may proceed with Cephalosporin use.     PAST MEDICAL HISTORY Past Medical History:  Diagnosis Date   AK (actinic keratosis)    Atrial fibrillation, persistent (HCC)    chronic coumadin therapy   Bifascicular block    Blindness    left eye   CKD (chronic kidney disease), stage III (HCC)    Coronary artery disease    20% RCA by 2014 cath   Elevated PSA    GERD (gastroesophageal reflux disease)    H/O hiatal hernia    Heart murmur    History of kidney stones    Hydrocele    left   Hyperlipidemia  Hypertension    Internal hemorrhoids    Kidney stones    kidney stones   Macular degeneration    right eye   Mitral regurgitation 02/25/2011   Recurrent MR s/p mitral valve repair then bioprosthetic MVR 2014   OA (osteoarthritis) of neck    S/P aortic valve replacement with bioprosthetic valve 08/15/2013   59mm Edwards Magna Ease bovine pericardial tissue valve   S/P mitral valve repair 09/15/2004   Complex valvuloplasty including artificial Goretex neocord placement x4 and 53mm SARP ring annuloplasty via right mini thoracotomy approach - Dr Evelina Dun @ [facility]   S/P redo mitral valve replacement and aortic valve replacement  with bioprosthetic valves 08/15/2013   56mm Edwards [facility] Mitral bovine pericardial tissue valve   Severe aortic stenosis 11/14   tissue AVR    Vocal cord paralysis    Past Surgical History:  Procedure Laterality Date   AORTIC VALVE REPLACEMENT N/A 08/15/2013   Procedure: AORTIC VALVE REPLACEMENT (AVR);  Surgeon: Rexene Alberts, MD;  Location: [place];  Service: Open Heart Surgery;  Laterality: N/A;   CATARACT EXTRACTION Bilateral 2011   Dr. Bing Plume   ESOPHAGOGASTRODUODENOSCOPY N/A 08/02/2013   Procedure: ESOPHAGOGASTRODUODENOSCOPY (EGD);  Surgeon: Casandra Doffing, MD;  [facility] ENDOSCOPY;  Service: Cardiovascular;  Laterality: N/A;   EYE SURGERY Right    infection   GROIN MASS OPEN BIOPSY  2012   HEMORRHOID SURGERY     pt denies.   INGUINAL HERNIA REPAIR  08/2011   INTRAOPERATIVE TRANSESOPHAGEAL ECHOCARDIOGRAM N/A 08/15/2013   Procedure: INTRAOPERATIVE TRANSESOPHAGEAL ECHOCARDIOGRAM;  Surgeon: Rexene Alberts, MD;  Location: [place];  Service: Open Heart Surgery;  Laterality: N/A;   LEFT AND RIGHT HEART CATHETERIZATION WITH CORONARY ANGIOGRAM N/A 07/05/2013   Procedure: LEFT AND RIGHT HEART CATHETERIZATION WITH CORONARY ANGIOGRAM;  Surgeon: Jettie Booze, MD;  Location: [facility] CATH LAB;  Service: Cardiovascular;  Laterality: N/A;   MASS EXCISION Right 03/30/2018   Procedure: EXCISION RIGHT FLANK  MASS;  Surgeon: Coralie Keens, MD;  Location: [place];  Service: General;  Laterality: Right;   MITRAL VALVE REPAIR  09/16/2011   @ [facility], Dr Evelina Dun   MITRAL VALVE REPAIR N/A 08/15/2013   Procedure: REDO MITRAL VALVE (MV) REPLACEMENT;  Surgeon: Rexene Alberts, MD;  Location: [place];  Service: Open Heart Surgery;  Laterality: N/A;   TEE WITHOUT CARDIOVERSION N/A 07/05/2013   Procedure: TRANSESOPHAGEAL ECHOCARDIOGRAM (TEE);  Surgeon: Casandra Doffing, MD;  Location: [facility] ENDOSCOPY;  Service: Cardiovascular;  Laterality: N/A;   TEE WITHOUT CARDIOVERSION N/A 08/02/2013   Procedure: TRANSESOPHAGEAL  ECHOCARDIOGRAM (TEE);  Surgeon: Casandra Doffing, MD;  Location: [place];  Service: Cardiovascular;  Laterality: N/A;   UMBILICAL HERNIA REPAIR N/A 03/30/2018   Procedure: HERNIA REPAIR UMBILICAL;  Surgeon: Coralie Keens, MD;  Location: [place];  Service: General;  Laterality: N/A;    FAMILY HISTORY Family History  Problem Relation Age of Onset   Cancer Father        colon   Hypertension Father    Diabetes Father    Hyperlipidemia Father    Stroke Father    Hypertension Mother    Diabetes Mother    Hyperlipidemia Mother    Stroke Mother    Heart attack Mother    Hypertension Brother        MI, S/P MVR    SOCIAL HISTORY Social History   Tobacco Use   Smoking status: Former    Years: 8.00    Types: Cigarettes    Quit  date: 10/12/1978    Years since quitting: 43.1   Smokeless tobacco: Never  Vaping Use   Vaping Use: Never used  Substance Use Topics   Alcohol use: No   Drug use: No         OPHTHALMIC EXAM:  Base Eye Exam     Visual Acuity (ETDRS)       Right Left   Dist cc 20/30 -1 20/400   Dist ph cc 20/30 +3     Correction: Glasses         Tonometry (Tonopen, 10:55 AM)       Right Left   Pressure 21 21         Pupils       Pupils   Right PERRL   Left PERRL         Visual Fields       Left Right     Full   Restrictions Partial inner superior temporal, inferior temporal, superior nasal, inferior nasal deficiencies          Extraocular Movement       Right Left    Full, Ortho Full, Ortho         Neuro/Psych     Oriented x3: Yes   Mood/Affect: Normal         Dilation     Right eye: 1.0% Mydriacyl, 2.5% Phenylephrine @ 10:54 AM           Slit Lamp and Fundus Exam     External Exam       Right Left   External Normal Normal         Slit Lamp Exam       Right Left   Lids/Lashes Normal Normal   Conjunctiva/Sclera White and quiet White and quiet   Cornea Clear Clear   Anterior Chamber Deep and quiet Deep  and quiet   Iris Round and reactive Round and reactive   Lens Posterior chamber intraocular lens Posterior chamber intraocular lens   Anterior Vitreous Normal Normal         Fundus Exam       Right Left   Posterior Vitreous Posterior vitreous detachment    Disc Peripapillary atrophy    C/D Ratio 0.8    Macula Advanced age related macular degeneration, Atrophy, Retinal pigment epithelial atrophy, Disciform scar, [place], Mottling, Retinal pigment epithelial mottling, Retinal pigment epithelial atrophy - geographic, mature CNVM, now with no subretinal hemorrhage ringing the fovea and inferiorly remaining, Geographic atrophy SUP to FAZ    Vessels Normal, , no DR    Periphery Normal             IMAGING AND PROCEDURES  Imaging and Procedures for 11/26/21  OCT, Retina - OU - Both Eyes       Right Eye Quality was good. Scan locations included subfoveal. Central Foveal Thickness: 338. Progression has been stable. Findings include cystoid macular edema, pigment epithelial detachment, disciform scar, subretinal fluid, intraretinal fluid.   Left Eye Quality was good. Scan locations included subfoveal. Central Foveal Thickness: 318. Progression has been stable. Findings include disciform scar, central retinal atrophy, outer retinal atrophy, inner retinal atrophy.   Notes OD, chronically active yet stable, for over 16 years in this office currently at 6-week interval with resistant to prior medications, with less intraretinal fluid today  Intraretinal fluid OD superotemporally stable over time, stable over time due to mature subfoveal fibrotic CNVM.  Subretinal hyper reflective material appears to be correlating with subretinal  hemorrhage     Intravitreal Injection, Pharmacologic Agent - OD - Right Eye       Time Out 11/26/2021. 11:35 AM. Confirmed correct patient, procedure, site, and patient consented.   Anesthesia Topical anesthesia was used. Anesthetic medications included  Lidocaine 4%.   Procedure Preparation included 10% betadine to eyelids, 5% betadine to ocular surface. A 30 gauge needle was used.   Injection: 0.5 mg Ranibizumab 0.5 MG/0.05ML   Route: Intravitreal, Site: Right Eye   NDC: [id number], Lot: [id number], Waste: 0 mL   Post-op Post injection exam found visual acuity of at least counting fingers. The patient tolerated the procedure well. There were no complications. The patient received written and verbal post procedure care education. Post injection medications included ocuflox.              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  Remains stable OD, with less intraretinal fluid in the retina overlying the complex lesion subfoveal.  We will repeat injection Lucentis today to maintain     ICD-10-CM   1. Exudative age-related macular degeneration of right eye with active choroidal neovascularization (HCC)  H35.3211 OCT, Retina - OU - Both Eyes    Intravitreal Injection, Pharmacologic Agent - OD - Right Eye    Ranibizumab SOSY 0.5 mg    2. 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  OD with persistent yet still controlled active CNVM complex subfoveal.  Much less intraretinal fluid maintained today at 6-week interval post Lucentis in the past.  2.  Repeat Lucentis OD today and follow-up again in 6 weeks, dilate OU next  3.  Ophthalmic Meds Ordered this visit:  Meds ordered this encounter  Medications   Ranibizumab SOSY 0.5 mg       Return in about 6 weeks (around 01/07/2022) for DILATE OU, LUCENTIS 0.5 OCT, OD.  There are no Patient Instructions on file for this visit.   Explained the diagnoses, plan, and follow up with the patient and they expressed understanding.   Patient expressed understanding of the importance of proper follow up care.   Clent Demark Magaret Justo M.D. Diseases & Surgery of the Retina and Vitreous Retina [facility] Diabetic [place] 11/26/21     Abbreviations: M myopia (nearsighted); A astigmatism; H hyperopia (farsighted); P presbyopia; Mrx spectacle prescription;  CTL contact lenses; OD right eye; OS left eye; OU both eyes  XT exotropia; ET esotropia; PEK punctate epithelial keratitis; PEE punctate epithelial erosions; DES dry eye syndrome; MGD meibomian gland dysfunction; ATs artificial tears; PFAT's preservative free artificial tears; [name] nuclear sclerotic cataract; PSC posterior subcapsular cataract; ERM epi-retinal membrane; PVD posterior vitreous detachment; RD retinal detachment; DM diabetes mellitus; DR diabetic retinopathy; NPDR non-proliferative diabetic retinopathy; PDR proliferative diabetic retinopathy; CSME clinically significant macular edema; DME diabetic macular edema; dbh dot blot hemorrhages; CWS cotton wool spot; POAG primary open angle glaucoma; C/D cup-to-disc ratio; HVF humphrey visual field; GVF goldmann visual field; OCT optical coherence tomography; IOP intraocular pressure; BRVO Branch retinal vein occlusion; CRVO central retinal vein occlusion; CRAO central retinal artery occlusion; BRAO branch retinal artery occlusion; RT retinal tear; SB scleral buckle; PPV pars plana vitrectomy; VH Vitreous hemorrhage; PRP panretinal laser photocoagulation; IVK intravitreal kenalog; VMT vitreomacular traction; MH Macular hole;  NVD neovascularization of the disc; NVE  neovascularization elsewhere; AREDS age related eye disease study; ARMD age related macular degeneration; POAG primary open angle glaucoma; EBMD epithelial/anterior basement membrane dystrophy; ACIOL anterior chamber intraocular lens; IOL intraocular lens; PCIOL posterior chamber intraocular lens; Phaco/IOL phacoemulsification with intraocular lens placement; [place] photorefractive  keratectomy; LASIK laser assisted in situ keratomileusis; HTN hypertension; DM diabetes mellitus; COPD chronic obstructive pulmonary disease

## 2021-11-26 NOTE — Assessment & Plan Note (Signed)
Stable over time. 

## 2021-11-26 NOTE — Assessment & Plan Note (Addendum)
OD, at 6-week interval.  Remains stable OD, with less intraretinal fluid in the retina overlying the complex lesion subfoveal.  We will repeat injection Lucentis today to maintain

## 2021-12-03 ENCOUNTER — Other Ambulatory Visit: Payer: Self-pay

## 2021-12-03 ENCOUNTER — Ambulatory Visit (INDEPENDENT_AMBULATORY_CARE_PROVIDER_SITE_OTHER): Payer: Medicare Other | Admitting: *Deleted

## 2021-12-03 DIAGNOSIS — I4821 Permanent atrial fibrillation: Secondary | ICD-10-CM

## 2021-12-03 DIAGNOSIS — Z953 Presence of xenogenic heart valve: Secondary | ICD-10-CM

## 2021-12-03 DIAGNOSIS — Z5181 Encounter for therapeutic drug level monitoring: Secondary | ICD-10-CM

## 2021-12-03 DIAGNOSIS — Z952 Presence of prosthetic heart valve: Secondary | ICD-10-CM

## 2021-12-03 LAB — POCT INR: INR: 3.3 — AB (ref 2.0–3.0)

## 2021-12-03 NOTE — Patient Instructions (Signed)
Description   Skip today's dosage of Warfarin, then Warfarin 1 tablet daily except 1/2 tablet on Sundays. Recheck INR in 3 weeks. Coumadin Clinic [phone].

## 2021-12-24 ENCOUNTER — Other Ambulatory Visit: Payer: Self-pay

## 2021-12-24 ENCOUNTER — Ambulatory Visit (INDEPENDENT_AMBULATORY_CARE_PROVIDER_SITE_OTHER): Payer: Medicare Other

## 2021-12-24 DIAGNOSIS — I4821 Permanent atrial fibrillation: Secondary | ICD-10-CM | POA: Diagnosis not present

## 2021-12-24 DIAGNOSIS — Z952 Presence of prosthetic heart valve: Secondary | ICD-10-CM | POA: Diagnosis not present

## 2021-12-24 DIAGNOSIS — Z953 Presence of xenogenic heart valve: Secondary | ICD-10-CM | POA: Diagnosis not present

## 2021-12-24 DIAGNOSIS — Z5181 Encounter for therapeutic drug level monitoring: Secondary | ICD-10-CM | POA: Diagnosis not present

## 2021-12-24 LAB — POCT INR: INR: 2.8 (ref 2.0–3.0)

## 2021-12-24 NOTE — Patient Instructions (Signed)
Description   ?Continue taking Warfarin 1 tablet daily except 1/2 tablet on Sundays. Recheck INR in 4 weeks. Coumadin Clinic [phone].  ?  ?   ?

## 2022-01-07 ENCOUNTER — Encounter (INDEPENDENT_AMBULATORY_CARE_PROVIDER_SITE_OTHER): Payer: Self-pay | Admitting: Ophthalmology

## 2022-01-07 ENCOUNTER — Other Ambulatory Visit: Payer: Self-pay

## 2022-01-07 ENCOUNTER — Ambulatory Visit (INDEPENDENT_AMBULATORY_CARE_PROVIDER_SITE_OTHER): Payer: Medicare Other | Admitting: Ophthalmology

## 2022-01-07 DIAGNOSIS — H353113 Nonexudative age-related macular degeneration, right eye, advanced atrophic without subfoveal involvement: Secondary | ICD-10-CM

## 2022-01-07 DIAGNOSIS — H353211 Exudative age-related macular degeneration, right eye, with active choroidal neovascularization: Secondary | ICD-10-CM

## 2022-01-07 DIAGNOSIS — H353223 Exudative age-related macular degeneration, left eye, with inactive scar: Secondary | ICD-10-CM

## 2022-01-07 MED ORDER — RANIBIZUMAB 0.5 MG/0.05ML IZ SOSY
0.5000 mg | PREFILLED_SYRINGE | INTRAVITREAL | Status: AC | PRN
  Administered 2022-01-07: .5 mg via INTRAVITREAL

## 2022-01-07 NOTE — Assessment & Plan Note (Signed)
Chronically active OD yet still stable with preserved central foveal acuity.  Repeat injection today at 6-week interval ?

## 2022-01-07 NOTE — Progress Notes (Signed)
? ? ?01/07/2022 ? ?  ? ?CHIEF COMPLAINT ?Patient presents for  ?Chief Complaint  ?Patient presents with  ? Macular Degeneration  ? ? ? ? ?HISTORY OF PRESENT ILLNESS: ?Jason [name] is a 88 y.o. male who presents to the clinic today for:  ? ?HPI   ?6 weeks dilate OU, Lucentis 0.5 OCT. OD. For history of wet ARMD OD ?Pt states "sometimes my vision is good, then the next day it is bad, then the next day it is good. It goes back and forth, like every third day it seems to change a little bit." ?Pt takes AREDS BID and uses Alphagan BID OU. ?Last edited by Hurman Horn, MD on 01/07/2022 11:37 AM.  ?  ? ? ?Referring physician: ?Shirline Frees, MD ?[address] ?[address] ?[place],  [place] [address] ? ?HISTORICAL INFORMATION:  ? ?Selected notes from the [facility] ?  ? ?Lab Results  ?Component Value Date  ? HGBA1C 5.4 08/11/2013  ?  ? ?CURRENT MEDICATIONS: ?Current Outpatient Medications (Ophthalmic Drugs)  ?Medication Sig  ? ALPHAGAN P 0.1 % SOLN INSTILL 1 DROP INTO BOTH EYES TWICE A DAY  ? ?No current facility-administered medications for this visit. (Ophthalmic Drugs)  ? ?Current Outpatient Medications (Other)  ?Medication Sig  ? acetaminophen (TYLENOL) 650 MG CR tablet Take 650 mg by mouth 2 (two) times daily.   ? Ascorbic Acid (VITAMIN C) 1000 MG tablet Take 1,000 mg by mouth daily.  ? atorvastatin (LIPITOR) 20 MG tablet Take 20 mg by mouth at bedtime.   ? b complex vitamins tablet Take 1 tablet by mouth daily.  ? diltiazem (CARDIZEM CD) 120 MG 24 hr capsule TAKE 1 CAPSULE BY MOUTH EVERY DAY  ? furosemide (LASIX) 40 MG tablet 40 mg in the am and 20 mg in the pm  ? lisinopril (ZESTRIL) 10 MG tablet Take 10 mg by mouth daily.  ? metoprolol succinate (TOPROL-XL) 25 MG 24 hr tablet TAKE 1/2 TABLETS (12.5 MG TOTAL) BY MOUTH IN THE MORNING AND AT BEDTIME.  ? Multiple Vitamins-Minerals (PRESERVISION AREDS PO) Take 1 tablet by mouth 2 (two) times daily.   ? warfarin (COUMADIN) 5 MG tablet TAKE AS DIRECTED BY  COUMADIN CLINIC  ? ?No current facility-administered medications for this visit. (Other)  ? ? ? ? ?REVIEW OF SYSTEMS: ?ROS   ?Negative for: Constitutional, Gastrointestinal, Neurological, Skin, Genitourinary, Musculoskeletal, HENT, Endocrine, Cardiovascular, Eyes, Respiratory, Psychiatric, Allergic/Imm, Heme/Lymph ?Last edited by Hurman Horn, MD on 01/07/2022 11:37 AM.  ?  ? ? ? ?ALLERGIES ?Allergies  ?Allergen Reactions  ? Penicillins Rash and Other (See Comments)  ?  Has patient had a PCN reaction causing immediate rash, facial/tongue/throat swelling, SOB or lightheadedness with hypotension: # # Yes # # ?Has patient had a PCN reaction causing severe rash involving mucus membranes or skin necrosis: No ?Has patient had a PCN reaction that required hospitalization: No ?Has patient had a PCN reaction occurring within the last 10 years: No ?If all of the above answers are "NO", then may proceed with Cephalosporin use. ?  ? ? ?PAST MEDICAL HISTORY ?Past Medical History:  ?Diagnosis Date  ? AK (actinic keratosis)   ? Atrial fibrillation, persistent ([place])   ? chronic coumadin therapy  ? Bifascicular block   ? Blindness   ? left eye  ? CKD (chronic kidney disease), stage III ([place])   ? Coronary artery disease   ? 20% RCA by 2014 cath  ? Elevated PSA   ?  GERD (gastroesophageal reflux disease)   ? H/O hiatal hernia   ? Heart murmur   ? History of kidney stones   ? Hydrocele   ? left  ? Hyperlipidemia   ? Hypertension   ? Internal hemorrhoids   ? Kidney stones   ? kidney stones  ? Macular degeneration   ? right eye  ? Mitral regurgitation 02/25/2011  ? Recurrent MR s/p mitral valve repair then bioprosthetic MVR 2014  ? OA (osteoarthritis) of neck   ? S/P aortic valve replacement with bioprosthetic valve 08/15/2013  ? 93m EQUALCOMMEase bovine pericardial tissue valve  ? S/P mitral valve repair 09/15/2004  ? Complex valvuloplasty including artificial Goretex neocord placement x4 and 352mSARP ring annuloplasty via right  mini thoracotomy approach [place] [facility] [place]? S/P redo mitral valve replacement and aortic valve replacement with bioprosthetic valves 08/15/2013  ? [id number] Magna Mitral bovine pericardial tissue valve  ? Severe aortic stenosis 11/14  ? tissue AVR   ? Vocal cord paralysis   ? ?Past Surgical History:  ?Procedure Laterality Date  ? AORTIC VALVE REPLACEMENT N/A 08/15/2013  ? Procedure: AORTIC VALVE REPLACEMENT (AVR);  Surgeon: ClaRexene AlbertsD;  [facility];  Laterality: N/A;  ? CATARACT EXTRACTION Bilateral 2011  ? Dr. DigBing Plume ESOPHAGOGASTRODUODENOSCOPY N/A 08/02/2013  ? Procedure: ESOPHAGOGASTRODUODENOSCOPY (EGD);  Surgeon: JayCasandra DoffingD;  [facility] [place] [facility];  Laterality: N/A;  ? EYE SURGERY Right   ? infection  ? GROIN MASS OPEN BIOPSY  2012  ? HEMORRHOID SURGERY    ? pt denies.  ? INGUINAL HERNIA REPAIR  08/2011  ? INTRAOPERATIVE TRANSESOPHAGEAL ECHOCARDIOGRAM N/A 08/15/2013  ? Procedure: INTRAOPERATIVE TRANSESOPHAGEAL ECHOCARDIOGRAM;  Surgeon: ClaRexene AlbertsD;  [facility] [place];  Laterality: N/A;  ? LEFT AND RIGHT HEART CATHETERIZATION WITH CORONARY ANGIOGRAM N/A 07/05/2013  ? Procedure: LEFT AND RIGHT HEART CATHETERIZATION WITH CORONARY ANGIOGRAM;  Surgeon: JayJettie BoozeD;  [facility];  Service: Cardiovascular;  Laterality: N/A;  ? MASS EXCISION Right 03/30/2018  ? Procedure: EXCISION RIGHT FLANK  MASS;  Surgeon: BlaCoralie KeensD;  [facility] [place] [place];  Laterality: Right;  ? MITRAL VALVE REPAIR  09/16/2011  ? @ [facility], Dr GloEvelina Dun MITRAL VALVE REPAIR N/A 08/15/2013  ? Procedure: REDO MITRAL VALVE (MV) REPLACEMENT;  Surgeon: ClaRexene AlbertsD;  [facility] [place];  Laterality: N/A;  ? TEE WITHOUT CARDIOVERSION N/A 07/05/2013  ? Procedure: TRANSESOPHAGEAL ECHOCARDIOGRAM (TEE);  Surgeon: JayCasandra DoffingD;  [facility];  Service:  Cardiovascular;  Laterality: N/A;  ? TEE WITHOUT CARDIOVERSION N/A 08/02/2013  ? Procedure: TRANSESOPHAGEAL ECHOCARDIOGRAM (TEE);  Surgeon: JayCasandra DoffingD;  [facility] [place];  Laterality: N/A;  ? UMBILICAL HERNIA REPAIR N/A 03/30/2018  ? Procedure: HERNIA REPAIR UMBILICAL;  Surgeon: BlaCoralie KeensD;  [facility] [place];  Laterality: N/A;  ? ? ?FAMILY HISTORY ?Family History  ?Problem Relation Age of Onset  ? Cancer Father   ?     colon  ? Hypertension Father   ? Diabetes Father   ? Hyperlipidemia Father   ? Stroke Father   ? Hypertension Mother   ? Diabetes Mother   ? Hyperlipidemia Mother   ? Stroke Mother   ? Heart attack Mother   ? Hypertension Brother   ?  MI, S/P MVR  ? ? ?SOCIAL HISTORY ?Social History  ? ?Tobacco Use  ? Smoking status: Former  ?  Years: 8.00  ?  Types: Cigarettes  ?  Quit date: 10/12/1978  ?  Years since quitting: 43.2  ? Smokeless tobacco: Never  ?Vaping Use  ? Vaping Use: Never used  ?Substance Use Topics  ? Alcohol use: No  ? Drug use: No  ? ?  ? ?  ? ?OPHTHALMIC EXAM: ? ?Base Eye Exam   ? ? Visual Acuity (ETDRS)   ? ?   Right Left  ? Dist cc 20/25 -1 20/320  ? Dist ph cc  NI  ? ? Correction: Glasses  ? ?  ?  ? ? Tonometry (Tonopen, 10:38 AM)   ? ?   Right Left  ? Pressure 26 21  ? ?  ?  ? ? Tonometry #2 (Tonopen, 10:39 AM)   ? ?   Right Left  ? Pressure 19 17  ? ?  ?  ? ? Pupils   ? ?   Pupils Dark Light APD  ? Right PERRL 3 3 None  ? Left PERRL 3 3 None  ? ?  ?  ? ? Extraocular Movement   ? ?   Right Left  ?  Full Full  ? ?  ?  ? ? Neuro/Psych   ? ? Oriented x3: Yes  ? Mood/Affect: Normal  ? ?  ?  ? ? Dilation   ? ? Both eyes: 1.0% Mydriacyl @ 10:38 AM  ? ?  ?  ? ?  ? ?Slit Lamp and Fundus Exam   ? ? External Exam   ? ?   Right Left  ? External Normal Normal  ? ?  ?  ? ? Slit Lamp Exam   ? ?   Right Left  ? Lids/Lashes Normal Normal  ? Conjunctiva/Sclera White and quiet White and quiet  ? Cornea Clear Clear  ? Anterior Chamber Deep  and quiet Deep and quiet  ? Iris Round and reactive Round and reactive  ? Lens Posterior chamber intraocular lens Posterior chamber intraocular lens  ? Anterior Vitreous Normal Normal  ? ?  ?  ? ? Fundus Exam   ? ?

## 2022-01-07 NOTE — Assessment & Plan Note (Signed)
Geographic atrophy encroaching near the [place] OD, ?

## 2022-01-07 NOTE — Assessment & Plan Note (Signed)
No active CNVM OS by OCT ?

## 2022-01-21 ENCOUNTER — Ambulatory Visit (INDEPENDENT_AMBULATORY_CARE_PROVIDER_SITE_OTHER): Payer: Medicare Other | Admitting: *Deleted

## 2022-01-21 DIAGNOSIS — Z952 Presence of prosthetic heart valve: Secondary | ICD-10-CM | POA: Diagnosis not present

## 2022-01-21 DIAGNOSIS — Z5181 Encounter for therapeutic drug level monitoring: Secondary | ICD-10-CM | POA: Diagnosis not present

## 2022-01-21 DIAGNOSIS — Z953 Presence of xenogenic heart valve: Secondary | ICD-10-CM

## 2022-01-21 LAB — POCT INR: INR: 2.3 (ref 2.0–3.0)

## 2022-01-21 NOTE — Patient Instructions (Signed)
Description   ?Continue taking Warfarin 1 tablet daily except 1/2 tablet on Sundays. Recheck INR in 5 weeks. Coumadin Clinic [phone].  ?  ? ? ?

## 2022-01-26 ENCOUNTER — Other Ambulatory Visit (INDEPENDENT_AMBULATORY_CARE_PROVIDER_SITE_OTHER): Payer: Self-pay | Admitting: Ophthalmology

## 2022-02-10 ENCOUNTER — Other Ambulatory Visit: Payer: Self-pay | Admitting: Interventional Cardiology

## 2022-02-16 NOTE — Progress Notes (Signed)
Cardiology Office Note   Date:  02/17/2022   ID:  Jason [name], DOB [birth year], MRN [id number]  PCP:  Johny Blamer, MD    No chief complaint on file.  AFib, s/p MVR, AVR  Wt Readings from Last 3 Encounters:  02/17/22 157 lb (71.2 kg)  12/16/20 169 lb 9.6 oz (76.9 kg)  11/29/19 174 lb (78.9 kg)       History of Present Illness: Jason [name] is a 88 y.o. male   who has had valvular heart disease and atrial fibrillation. He underwent aortic valve and mitral valve replacement in 11/14.   He was initially supposed to have a Maze procedure along with his valve surgery. Due to scar tissue from his initial surgery 9 year prior at [facility], he did not have a maze done. He has been rate controlled I the past, and anticoagulated.    He was told his kidney function was abnormal in 2016.     He has intentionally lost weight in 2016 through portion control and exercise.   He had a lipoma removed and a hernia repair in 2019.  No cardiac issues.     In 2021, "He has had some ringing in the ears, and headache; 2 episodes.  Monitor was prescribed to detect arrhythmia in those situations. "  Wife passed away in 2021/04/13.  She had memory issues that progressed rapidly.  She had some type of seizure- she did not wake up for days.  She had a brain tumor and lived only a few weeks.   Today, he is well.    He walks on days it does not rain for 30 minutes.    Denies : Chest pain. Dizziness. Leg edema. Nitroglycerin use. Orthopnea. Palpitations. Paroxysmal nocturnal dyspnea. Shortness of breath. Syncope.        Past Medical History:  Diagnosis Date   AK (actinic keratosis)    Atrial fibrillation, persistent (HCC)    chronic coumadin therapy   Bifascicular block    Blindness    left eye   CKD (chronic kidney disease), stage III (HCC)    Coronary artery disease    20% RCA by 2014 cath   Elevated PSA    GERD (gastroesophageal reflux disease)    H/O hiatal hernia    Heart murmur     History of kidney stones    Hydrocele    left   Hyperlipidemia    Hypertension    Internal hemorrhoids    Kidney stones    kidney stones   Macular degeneration    right eye   Mitral regurgitation 02/25/2011   Recurrent MR s/p mitral valve repair then bioprosthetic MVR 2014   OA (osteoarthritis) of neck    S/P aortic valve replacement with bioprosthetic valve 08/15/2013   23mm Edwards Magna Ease bovine pericardial tissue valve   S/P mitral valve repair 09/15/2004   Complex valvuloplasty including artificial Goretex neocord placement x4 and 36mm SARP ring annuloplasty via right mini thoracotomy approach - Dr Silvestre Mesi @ [facility]   S/P redo mitral valve replacement and aortic valve replacement with bioprosthetic valves 08/15/2013   27mm Edwards Magna Mitral bovine pericardial tissue valve   Severe aortic stenosis 11/14   tissue AVR    Vocal cord paralysis     Past Surgical History:  Procedure Laterality Date   AORTIC VALVE REPLACEMENT N/A 08/15/2013   Procedure: AORTIC VALVE REPLACEMENT (AVR);  Surgeon: Purcell Nails, MD;  Location: [facility] OR;  Service: Open  Heart Surgery;  Laterality: N/A;   CATARACT EXTRACTION Bilateral 2011   Dr. Hazle Quant   ESOPHAGOGASTRODUODENOSCOPY N/A 08/02/2013   Procedure: ESOPHAGOGASTRODUODENOSCOPY (EGD);  Surgeon: Everette Rank, MD;  Location: [facility] ENDOSCOPY;  Service: Cardiovascular;  Laterality: N/A;   EYE SURGERY Right    infection   GROIN MASS OPEN BIOPSY  2012   HEMORRHOID SURGERY     pt denies.   INGUINAL HERNIA REPAIR  08/2011   INTRAOPERATIVE TRANSESOPHAGEAL ECHOCARDIOGRAM N/A 08/15/2013   Procedure: INTRAOPERATIVE TRANSESOPHAGEAL ECHOCARDIOGRAM;  Surgeon: Purcell Nails, MD;  Location: [facility] OR;  Service: Open Heart Surgery;  Laterality: N/A;   LEFT AND RIGHT HEART CATHETERIZATION WITH CORONARY ANGIOGRAM N/A 07/05/2013   Procedure: LEFT AND RIGHT HEART CATHETERIZATION WITH CORONARY ANGIOGRAM;  Surgeon: Corky Crafts, MD;  Location: [facility] CATH LAB;  Service:  Cardiovascular;  Laterality: N/A;   MASS EXCISION Right 03/30/2018   Procedure: EXCISION RIGHT FLANK  MASS;  Surgeon: Abigail Miyamoto, MD;  Location: [facility] OR;  Service: General;  Laterality: Right;   MITRAL VALVE REPAIR  09/16/2011   @ [facility], Dr Silvestre Mesi   MITRAL VALVE REPAIR N/A 08/15/2013   Procedure: REDO MITRAL VALVE (MV) REPLACEMENT;  Surgeon: Purcell Nails, MD;  [facility] OR;  Service: Open Heart Surgery;  Laterality: N/A;   TEE WITHOUT CARDIOVERSION N/A 07/05/2013   Procedure: TRANSESOPHAGEAL ECHOCARDIOGRAM (TEE);  Surgeon: Everette Rank, MD;  Location: [facility] ENDOSCOPY;  Service: Cardiovascular;  Laterality: N/A;   TEE WITHOUT CARDIOVERSION N/A 08/02/2013   Procedure: TRANSESOPHAGEAL ECHOCARDIOGRAM (TEE);  Surgeon: Everette Rank, MD;  Location: [facility] ENDOSCOPY;  Service: Cardiovascular;  Laterality: N/A;   UMBILICAL HERNIA REPAIR N/A 03/30/2018   Procedure: HERNIA REPAIR UMBILICAL;  Surgeon: Abigail Miyamoto, MD;  Location: [facility] OR;  Service: General;  Laterality: N/A;     Current Outpatient Medications  Medication Sig Dispense Refill   acetaminophen (TYLENOL) 650 MG CR tablet Take 650 mg by mouth 2 (two) times daily.      ALPHAGAN P 0.1 % SOLN INSTILL 1 DROP INTO BOTH EYES TWICE A DAY 5 mL 5   Ascorbic Acid (VITAMIN C) 1000 MG tablet Take 1,000 mg by mouth daily.     atorvastatin (LIPITOR) 20 MG tablet Take 20 mg by mouth at bedtime.      b complex vitamins tablet Take 1 tablet by mouth daily.     diltiazem (CARDIZEM CD) 120 MG 24 hr capsule TAKE 1 CAPSULE BY MOUTH EVERY DAY 30 capsule 0   furosemide (LASIX) 40 MG tablet 40 mg in the am and 20 mg in the pm 30 tablet    lisinopril (ZESTRIL) 10 MG tablet Take 10 mg by mouth daily.     metoprolol succinate (TOPROL-XL) 25 MG 24 hr tablet TAKE 1/2 TABLETS (12.5 MG TOTAL) BY MOUTH IN THE MORNING AND AT BEDTIME. 90 tablet 3   Multiple Vitamins-Minerals (PRESERVISION AREDS PO) Take 1 tablet by mouth 2 (two) times daily.      warfarin (COUMADIN) 5 MG  tablet TAKE AS DIRECTED BY COUMADIN CLINIC 120 tablet 1   No current facility-administered medications for this visit.    Allergies:   Penicillins    Social History:  The patient  reports that he quit smoking about 43 years ago. His smoking use included cigarettes. He has never used smokeless tobacco. He reports that he does not drink alcohol and does not use drugs.   Family History:  The patient's family history includes Cancer in his father; Diabetes in his father and  mother; Heart attack in his mother; Hyperlipidemia in his father and mother; Hypertension in his brother, father, and mother; Stroke in his father and mother.    ROS:  Please see the history of present illness.   Otherwise, review of systems are positive for dietary indiscretion.   All other systems are reviewed and negative.    PHYSICAL EXAM: VS:  BP 128/68   Pulse 72   Ht 5\' 8"  (1.727 m)   Wt 157 lb (71.2 kg)   SpO2 96%   BMI 23.87 kg/m  , BMI Body mass index is 23.87 kg/m. GEN: Well nourished, well developed, in no acute distress HEENT: normal Neck: no JVD, carotid bruits, or masses Cardiac: RRR; 2/6 systolic murmur, no rubs, or gallops,no edema  Respiratory:  clear to auscultation bilaterally, normal work of breathing GI: soft, nontender, nondistended, + BS MS: no deformity or atrophy Skin: warm and dry, no rash Neuro:  Strength and sensation are intact Psych: euthymic mood, full affect   EKG:   The ekg ordered today demonstrates AFib    Recent Labs: No results found for requested labs within last 8760 hours.   Lipid Panel    Component Value Date/Time   CHOL 145 11/29/2019 1045   TRIG 116 11/29/2019 1045   HDL 51 11/29/2019 1045   CHOLHDL 2.8 11/29/2019 1045   LDLCALC 73 11/29/2019 1045     Other studies Reviewed: Additional studies/ records that were reviewed today with results demonstrating: labs reviewed.   ASSESSMENT AND PLAN:  AFib: rate controlled.  Coumadin for stroke prevention.  Continue diltiazem.  Anticoagulated:  No bleeding problems. Followed in Coumadin clinic.  Mitral regurgitation: s/p MVR.  No CHF.  Euvolemic on Lasix.  Aortic stenosis: s/p AVR. SBE prophylaxis. CKD3: Cr 1.1 in 8/22. Avoid nephrotoxins.  Hyperlipidemia: LDL 53 in 05/2021.  Diet limitations since he does not cook.    Current medicines are reviewed at length with the patient today.  The patient concerns regarding his medicines were addressed.  The following changes have been made:  No change  Labs/ tests ordered today include:  No orders of the defined types were placed in this encounter.   Recommend 150 minutes/week of aerobic exercise Low fat, low carb, high fiber diet recommended  Disposition:   FU in 1 year   Signed, Lance Muss, MD  02/17/2022 4:52 PM    [facility] [address] [place], [place], [place]  [address] Phone: [phone]; Fax: [phone]

## 2022-02-17 ENCOUNTER — Ambulatory Visit (INDEPENDENT_AMBULATORY_CARE_PROVIDER_SITE_OTHER): Payer: Medicare Other | Admitting: Interventional Cardiology

## 2022-02-17 VITALS — BP 128/68 | HR 72 | Ht 68.0 in | Wt 157.0 lb

## 2022-02-17 DIAGNOSIS — I4821 Permanent atrial fibrillation: Secondary | ICD-10-CM | POA: Diagnosis not present

## 2022-02-17 DIAGNOSIS — I10 Essential (primary) hypertension: Secondary | ICD-10-CM | POA: Diagnosis not present

## 2022-02-17 DIAGNOSIS — Z952 Presence of prosthetic heart valve: Secondary | ICD-10-CM | POA: Diagnosis not present

## 2022-02-17 MED ORDER — DILTIAZEM HCL ER COATED BEADS 120 MG PO CP24: ORAL_CAPSULE | ORAL | 3 refills | Status: DC

## 2022-02-17 NOTE — Patient Instructions (Signed)

## 2022-02-18 ENCOUNTER — Ambulatory Visit (INDEPENDENT_AMBULATORY_CARE_PROVIDER_SITE_OTHER): Payer: Medicare Other | Admitting: Ophthalmology

## 2022-02-18 ENCOUNTER — Encounter (INDEPENDENT_AMBULATORY_CARE_PROVIDER_SITE_OTHER): Payer: Self-pay | Admitting: Ophthalmology

## 2022-02-18 DIAGNOSIS — H353223 Exudative age-related macular degeneration, left eye, with inactive scar: Secondary | ICD-10-CM

## 2022-02-18 DIAGNOSIS — H353211 Exudative age-related macular degeneration, right eye, with active choroidal neovascularization: Secondary | ICD-10-CM

## 2022-02-18 MED ORDER — RANIBIZUMAB 0.5 MG/0.05ML IZ SOSY
0.5000 mg | PREFILLED_SYRINGE | INTRAVITREAL | Status: AC | PRN
  Administered 2022-02-18: .5 mg via INTRAVITREAL

## 2022-02-18 NOTE — Assessment & Plan Note (Signed)
No sign of worsening OS by  OCT ?

## 2022-02-18 NOTE — Assessment & Plan Note (Signed)
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  Controlled on Lucentis.  Proven resistance to Avastin and Eylea in the past. ? ?Proven recurrences and worsening at longer intervals of follow-up.  We will repeat injection Lucentis today has maintained stable acuity and macular findings ?

## 2022-02-18 NOTE — Progress Notes (Signed)
? ? ?02/18/2022 ? ?  ? ?CHIEF COMPLAINT ?Patient presents for  ?Chief Complaint  ?Patient presents with  ? Macular Degeneration  ? ? ? ? ?HISTORY OF PRESENT ILLNESS: ?Jason [name] is a 88 y.o. male who presents to the clinic today for:  ? ?HPI   ?6 weeks for DILATE, OD. ?Pt denies floaters and FOL. ?Pt stated no changes in vision. ? ?Last edited by Silvestre Moment on 02/18/2022 10:57 AM.  ?  ? ? ?Referring physician: ?Shirline Frees, MD ?[address] ?[address] ?[place],  [place] [address] ? ?HISTORICAL INFORMATION:  ? ?Selected notes from the [facility] ?  ? ?Lab Results  ?Component Value Date  ? HGBA1C 5.4 08/11/2013  ?  ? ?CURRENT MEDICATIONS: ?Current Outpatient Medications (Ophthalmic Drugs)  ?Medication Sig  ? ALPHAGAN P 0.1 % SOLN INSTILL 1 DROP INTO BOTH EYES TWICE A DAY  ? ?No current facility-administered medications for this visit. (Ophthalmic Drugs)  ? ?Current Outpatient Medications (Other)  ?Medication Sig  ? acetaminophen (TYLENOL) 650 MG CR tablet Take 650 mg by mouth 2 (two) times daily.   ? Ascorbic Acid (VITAMIN C) 1000 MG tablet Take 1,000 mg by mouth daily.  ? atorvastatin (LIPITOR) 20 MG tablet Take 20 mg by mouth at bedtime.   ? b complex vitamins tablet Take 1 tablet by mouth daily.  ? diltiazem (CARDIZEM CD) 120 MG 24 hr capsule TAKE 1 CAPSULE BY MOUTH EVERY DAY  ? furosemide (LASIX) 40 MG tablet 40 mg in the am and 20 mg in the pm  ? lisinopril (ZESTRIL) 10 MG tablet Take 10 mg by mouth daily.  ? metoprolol succinate (TOPROL-XL) 25 MG 24 hr tablet TAKE 1/2 TABLETS (12.5 MG TOTAL) BY MOUTH IN THE MORNING AND AT BEDTIME.  ? Multiple Vitamins-Minerals (PRESERVISION AREDS PO) Take 1 tablet by mouth 2 (two) times daily.   ? warfarin (COUMADIN) 5 MG tablet TAKE AS DIRECTED BY COUMADIN CLINIC  ? ?No current facility-administered medications for this visit. (Other)  ? ? ? ? ?REVIEW OF SYSTEMS: ?ROS   ?Negative for: Constitutional, Gastrointestinal, Neurological, Skin, Genitourinary,  Musculoskeletal, HENT, Endocrine, Cardiovascular, Eyes, Respiratory, Psychiatric, Allergic/Imm, Heme/Lymph ?Last edited by Silvestre Moment on 02/18/2022 10:57 AM.  ?  ? ? ? ?ALLERGIES ?Allergies  ?Allergen Reactions  ? Penicillins Rash and Other (See Comments)  ?  Has patient had a PCN reaction causing immediate rash, facial/tongue/throat swelling, SOB or lightheadedness with hypotension: # # Yes # # ?Has patient had a PCN reaction causing severe rash involving mucus membranes or skin necrosis: No ?Has patient had a PCN reaction that required hospitalization: No ?Has patient had a PCN reaction occurring within the last 10 years: No ?If all of the above answers are "NO", then may proceed with Cephalosporin use. ?  ? ? ?PAST MEDICAL HISTORY ?Past Medical History:  ?Diagnosis Date  ? AK (actinic keratosis)   ? Atrial fibrillation, persistent ([place])   ? chronic coumadin therapy  ? Bifascicular block   ? Blindness   ? left eye  ? CKD (chronic kidney disease), stage III ([place])   ? Coronary artery disease   ? 20% RCA by 2014 cath  ? Elevated PSA   ? GERD (gastroesophageal reflux disease)   ? H/O hiatal hernia   ? Heart murmur   ? History of kidney stones   ? Hydrocele   ? left  ? Hyperlipidemia   ? Hypertension   ? Internal hemorrhoids   ? Kidney stones   ?  kidney stones  ? Macular degeneration   ? right eye  ? Mitral regurgitation 02/25/2011  ? Recurrent MR s/p mitral valve repair then bioprosthetic MVR 2014  ? OA (osteoarthritis) of neck   ? S/P aortic valve replacement with bioprosthetic valve 08/15/2013  ? 69m EQUALCOMMEase bovine pericardial tissue valve  ? S/P mitral valve repair 09/15/2004  ? Complex valvuloplasty including artificial Goretex neocord placement x4 and 32mSARP ring annuloplasty via right mini thoracotomy approach [place]? S/P redo mitral valve replacement and aortic valve replacement with bioprosthetic valves 08/15/2013  ? 272mdwards Magna Mitral bovine pericardial tissue valve  ? Severe  aortic stenosis 11/14  ? tissue AVR   ? Vocal cord paralysis   ? ?Past Surgical History:  ?Procedure Laterality Date  ? AORTIC VALVE REPLACEMENT N/A 08/15/2013  ? Procedure: AORTIC VALVE REPLACEMENT (AVR);  Surgeon: ClaRexene AlbertsD;  [facility] [place];  Laterality: N/A;  ? CATARACT EXTRACTION Bilateral 2011  ? Dr. DigBing Plume ESOPHAGOGASTRODUODENOSCOPY N/A 08/02/2013  ? Procedure: ESOPHAGOGASTRODUODENOSCOPY (EGD);  Surgeon: JayCasandra DoffingD;  [facility] [place];  Laterality: N/A;  ? EYE SURGERY Right   ? infection  ? GROIN MASS OPEN BIOPSY  2012  ? HEMORRHOID SURGERY    ? pt denies.  ? INGUINAL HERNIA REPAIR  08/2011  ? INTRAOPERATIVE TRANSESOPHAGEAL ECHOCARDIOGRAM N/A 08/15/2013  ? Procedure: INTRAOPERATIVE TRANSESOPHAGEAL ECHOCARDIOGRAM;  Surgeon: ClaRexene AlbertsD;  [facility] [place];  Laterality: N/A;  ? LEFT AND RIGHT HEART CATHETERIZATION WITH CORONARY ANGIOGRAM N/A 07/05/2013  ? Procedure: LEFT AND RIGHT HEART CATHETERIZATION WITH CORONARY ANGIOGRAM;  Surgeon: JayJettie BoozeD;  [facility];  Service: Cardiovascular;  Laterality: N/A;  ? MASS EXCISION Right 03/30/2018  ? Procedure: EXCISION RIGHT FLANK  MASS;  Surgeon: BlaCoralie KeensD;  [facility] [place];  Laterality: Right;  ? MITRAL VALVE REPAIR  09/16/2011  ? @ [facility], Dr GloEvelina Dun MITRAL VALVE REPAIR N/A 08/15/2013  ? Procedure: REDO MITRAL VALVE (MV) REPLACEMENT;  Surgeon: ClaRexene AlbertsD;  [facility] [place] [place];  Laterality: N/A;  ? TEE WITHOUT CARDIOVERSION N/A 07/05/2013  ? Procedure: TRANSESOPHAGEAL ECHOCARDIOGRAM (TEE);  Surgeon: JayCasandra DoffingD;  [facility];  Service: Cardiovascular;  Laterality: N/A;  ? TEE WITHOUT CARDIOVERSION N/A 08/02/2013  ? Procedure: TRANSESOPHAGEAL ECHOCARDIOGRAM (TEE);  Surgeon: JayCasandra DoffingD;  [facility] [place];  Laterality: N/A;   ? UMBILICAL HERNIA REPAIR N/A 03/30/2018  ? Procedure: HERNIA REPAIR UMBILICAL;  Surgeon: BlaCoralie KeensD;  [facility] [place];  Laterality: N/A;  ? ? ?FAMILY HISTORY ?Family History  ?Problem Relation Age of Onset  ? Cancer Father   ?     colon  ? Hypertension Father   ? Diabetes Father   ? Hyperlipidemia Father   ? Stroke Father   ? Hypertension Mother   ? Diabetes Mother   ? Hyperlipidemia Mother   ? Stroke Mother   ? Heart attack Mother   ? Hypertension Brother   ?     MI, S/P MVR  ? ? ?SOCIAL HISTORY ?Social History  ? ?Tobacco Use  ? Smoking status: Former  ?  Years: 8.00  ?  Types: Cigarettes  ?  Quit date: 10/12/1978  ?  Years since quitting: 43.3  ? Smokeless tobacco: Never  ?  Vaping Use  ? Vaping Use: Never used  ?Substance Use Topics  ? Alcohol use: No  ? Drug use: No  ? ?  ? ?  ? ?OPHTHALMIC EXAM: ? ?Base Eye Exam   ? ? Visual Acuity (ETDRS)   ? ?   Right Left  ? Dist cc 20/25 -2 20/400  ? Dist ph cc  NI  ? ? Correction: Glasses  ? ?  ?  ? ? Tonometry (Tonopen, 11:00 AM)   ? ?   Right Left  ? Pressure 24 20  ? ?  ?  ? ? Pupils   ? ?   Pupils APD  ? Right PERRL None  ? Left PERRL None  ? ?  ?  ? ? Visual Fields   ? ?   Left Right  ? Restrictions Partial inner superior temporal, inferior temporal, superior nasal, inferior nasal deficiencies   ? ?  ?  ? ? Extraocular Movement   ? ?   Right Left  ?  Full Full  ? ?  ?  ? ? Neuro/Psych   ? ? Oriented x3: Yes  ? Mood/Affect: Normal  ? ?  ?  ? ? Dilation   ? ? Right eye: 2.5% Phenylephrine, 1.0% Mydriacyl @ 11:00 AM  ? ?  ?  ? ?  ? ?Slit Lamp and Fundus Exam   ? ? External Exam   ? ?   Right Left  ? External Normal Normal  ? ?  ?  ? ? Slit Lamp Exam   ? ?   Right Left  ? Lids/Lashes Normal Normal  ? Conjunctiva/Sclera White and quiet White and quiet  ? Cornea Clear Clear  ? Anterior Chamber Deep and quiet Deep and quiet  ? Iris Round and reactive Round and reactive  ? Lens Posterior chamber intraocular lens Posterior chamber intraocular lens   ? Anterior Vitreous Normal Normal  ? ?  ?  ? ? Fundus Exam   ? ?   Right Left  ? Posterior Vitreous  Posterior vitreous detachment  ? Disc  Peripapillary atrophy  ? C/D Ratio  0.8  ? Macula  Advanced age related macul

## 2022-02-19 ENCOUNTER — Other Ambulatory Visit: Payer: Self-pay | Admitting: Interventional Cardiology

## 2022-02-25 ENCOUNTER — Ambulatory Visit (INDEPENDENT_AMBULATORY_CARE_PROVIDER_SITE_OTHER): Payer: Medicare Other | Admitting: *Deleted

## 2022-02-25 DIAGNOSIS — Z5181 Encounter for therapeutic drug level monitoring: Secondary | ICD-10-CM | POA: Diagnosis not present

## 2022-02-25 DIAGNOSIS — I4821 Permanent atrial fibrillation: Secondary | ICD-10-CM | POA: Diagnosis not present

## 2022-02-25 DIAGNOSIS — Z953 Presence of xenogenic heart valve: Secondary | ICD-10-CM

## 2022-02-25 DIAGNOSIS — Z952 Presence of prosthetic heart valve: Secondary | ICD-10-CM | POA: Diagnosis not present

## 2022-02-25 LAB — POCT INR: INR: 2.1 (ref 2.0–3.0)

## 2022-02-25 NOTE — Patient Instructions (Signed)
Description   Continue taking Warfarin 1 tablet daily except 1/2 tablet on Sundays. Recheck INR in 6 weeks. Coumadin Clinic [phone].       

## 2022-04-01 ENCOUNTER — Ambulatory Visit (INDEPENDENT_AMBULATORY_CARE_PROVIDER_SITE_OTHER): Payer: Medicare Other | Admitting: Ophthalmology

## 2022-04-01 ENCOUNTER — Encounter (INDEPENDENT_AMBULATORY_CARE_PROVIDER_SITE_OTHER): Payer: Self-pay | Admitting: Ophthalmology

## 2022-04-01 DIAGNOSIS — H353113 Nonexudative age-related macular degeneration, right eye, advanced atrophic without subfoveal involvement: Secondary | ICD-10-CM

## 2022-04-01 DIAGNOSIS — H353223 Exudative age-related macular degeneration, left eye, with inactive scar: Secondary | ICD-10-CM | POA: Diagnosis not present

## 2022-04-01 DIAGNOSIS — H353211 Exudative age-related macular degeneration, right eye, with active choroidal neovascularization: Secondary | ICD-10-CM | POA: Diagnosis not present

## 2022-04-01 MED ORDER — RANIBIZUMAB 0.5 MG/0.05ML IZ SOSY
0.5000 mg | PREFILLED_SYRINGE | INTRAVITREAL | Status: AC | PRN
  Administered 2022-04-01: .5 mg via INTRAVITREAL

## 2022-04-01 NOTE — Assessment & Plan Note (Signed)
Chronic active, stabilized remains on Lucentis every 6-week evaluation and exam

## 2022-04-01 NOTE — Assessment & Plan Note (Signed)
Geographic atrophy nearing the FAZ

## 2022-04-01 NOTE — Progress Notes (Signed)
04/01/2022     CHIEF COMPLAINT Patient presents for  Chief Complaint  Patient presents with   Macular Degeneration      HISTORY OF PRESENT ILLNESS: Jason [name] is a 88 y.o. male who presents to the clinic today for:   HPI   6 weeks dilate OU, Lucentis 0.5 OCT, OD. Patient states vision is stable and unchanged since last visit. Denies any new floaters or FOL. Patient reports he uses Alphagan BID OU.   Last edited by Laurin Coder on 04/01/2022 10:51 AM.      Referring physician: Shirline Frees, MD [address] [place],  [place] [id number]  HISTORICAL INFORMATION:   Selected notes from the MEDICAL RECORD NUMBER    Lab Results  Component Value Date   HGBA1C 5.4 08/11/2013     CURRENT MEDICATIONS: Current Outpatient Medications (Ophthalmic Drugs)  Medication Sig   ALPHAGAN P 0.1 % SOLN INSTILL 1 DROP INTO BOTH EYES TWICE A DAY   No current facility-administered medications for this visit. (Ophthalmic Drugs)   Current Outpatient Medications (Other)  Medication Sig   acetaminophen (TYLENOL) 650 MG CR tablet Take 650 mg by mouth 2 (two) times daily.    Ascorbic Acid (VITAMIN C) 1000 MG tablet Take 1,000 mg by mouth daily.   atorvastatin (LIPITOR) 20 MG tablet Take 20 mg by mouth at bedtime.    b complex vitamins tablet Take 1 tablet by mouth daily.   diltiazem (CARDIZEM CD) 120 MG 24 hr capsule TAKE 1 CAPSULE BY MOUTH EVERY DAY   furosemide (LASIX) 40 MG tablet 40 mg in the am and 20 mg in the pm   lisinopril (ZESTRIL) 10 MG tablet Take 10 mg by mouth daily.   metoprolol succinate (TOPROL-XL) 25 MG 24 hr tablet TAKE 1/2 TABLETS (12.5 MG TOTAL) BY MOUTH IN THE MORNING AND AT BEDTIME.   Multiple Vitamins-Minerals (PRESERVISION AREDS PO) Take 1 tablet by mouth 2 (two) times daily.    warfarin (COUMADIN) 5 MG tablet TAKE AS DIRECTED BY COUMADIN CLINIC   No current facility-administered medications for this visit. (Other)      REVIEW OF  SYSTEMS: ROS   Negative for: Constitutional, Gastrointestinal, Neurological, Skin, Genitourinary, Musculoskeletal, HENT, Endocrine, Cardiovascular, Eyes, Respiratory, Psychiatric, Allergic/Imm, Heme/Lymph Last edited by Hurman Horn, MD on 04/01/2022 11:27 AM.       ALLERGIES Allergies  Allergen Reactions   Penicillins Rash and Other (See Comments)    Has patient had a PCN reaction causing immediate rash, facial/tongue/throat swelling, SOB or lightheadedness with hypotension: # # Yes # # Has patient had a PCN reaction causing severe rash involving mucus membranes or skin necrosis: No Has patient had a PCN reaction that required hospitalization: No Has patient had a PCN reaction occurring within the last 10 years: No If all of the above answers are "NO", then may proceed with Cephalosporin use.     PAST MEDICAL HISTORY Past Medical History:  Diagnosis Date   AK (actinic keratosis)    Atrial fibrillation, persistent (HCC)    chronic coumadin therapy   Bifascicular block    Blindness    left eye   CKD (chronic kidney disease), stage III (HCC)    Coronary artery disease    20% RCA by 2014 cath   Elevated PSA    GERD (gastroesophageal reflux disease)    H/O hiatal hernia    Heart murmur    History of kidney stones    Hydrocele  left   Hyperlipidemia    Hypertension    Internal hemorrhoids    Kidney stones    kidney stones   Macular degeneration    right eye   Mitral regurgitation 02/25/2011   Recurrent MR s/p mitral valve repair then bioprosthetic MVR 2014   OA (osteoarthritis) of neck    S/P aortic valve replacement with bioprosthetic valve 08/15/2013   73m Edwards Magna Ease bovine pericardial tissue valve   S/P mitral valve repair 09/15/2004   Complex valvuloplasty including artificial Goretex neocord placement x4 and 319mSARP ring annuloplasty via right mini thoracotomy approach [facility] redo mitral valve replacement and aortic valve replacement  with bioprosthetic valves 08/15/2013   [id number]   Severe aortic stenosis 11/14   tissue AVR    Vocal cord paralysis    Past Surgical History:  Procedure Laterality Date   AORTIC VALVE REPLACEMENT N/A 08/15/2013   Procedure: AORTIC VALVE REPLACEMENT (AVR);  Surgeon: ClaRexene AlbertsD;  [facility];  Laterality: N/A;   CATARACT EXTRACTION Bilateral 2011   Dr. DigBing PlumeESOPHAGOGASTRODUODENOSCOPY N/A 08/02/2013   Procedure: ESOPHAGOGASTRODUODENOSCOPY (EGD);  Surgeon: JayCasandra DoffingD;  [facility];  Service: Cardiovascular;  Laterality: N/A;   EYE SURGERY Right    infection   GROIN MASS OPEN BIOPSY  2012   HEMORRHOID SURGERY     pt denies.   INGUINAL HERNIA REPAIR  08/2011   INTRAOPERATIVE TRANSESOPHAGEAL ECHOCARDIOGRAM N/A 08/15/2013   Procedure: INTRAOPERATIVE TRANSESOPHAGEAL ECHOCARDIOGRAM;  Surgeon: ClaRexene AlbertsD;  [facility];  Laterality: N/A;   LEFT AND RIGHT HEART CATHETERIZATION WITH CORONARY ANGIOGRAM N/A 07/05/2013   Procedure: LEFT AND RIGHT HEART CATHETERIZATION WITH CORONARY ANGIOGRAM;  Surgeon: JayJettie BoozeD;  [facility];  Service: Cardiovascular;  Laterality: N/A;   MASS EXCISION Right 03/30/2018   Procedure: EXCISION RIGHT FLANK  MASS;  Surgeon: BlaCoralie KeensD;  [facility];  Laterality: Right;   MITRAL VALVE REPAIR  09/16/2011   @ [facility], Dr GloEvelina DunMITRAL VALVE REPAIR N/A 08/15/2013   Procedure: REDO MITRAL VALVE (MV) REPLACEMENT;  Surgeon: ClaRexene AlbertsD;  [facility];  Laterality: N/A;   TEE WITHOUT CARDIOVERSION N/A 07/05/2013   Procedure: TRANSESOPHAGEAL ECHOCARDIOGRAM (TEE);  Surgeon: JayCasandra DoffingD;  [facility];  Service: Cardiovascular;  Laterality: N/A;   TEE WITHOUT CARDIOVERSION N/A 08/02/2013   Procedure: TRANSESOPHAGEAL  ECHOCARDIOGRAM (TEE);  Surgeon: JayCasandra DoffingD;  [facility] [place];  Laterality: N/A;   UMBILICAL HERNIA REPAIR N/A 03/30/2018   Procedure: HERNIA REPAIR UMBILICAL;  Surgeon: BlaCoralie KeensD;  [facility];  Laterality: N/A;    FAMILY HISTORY Family History  Problem Relation Age of Onset   Cancer Father        colon   Hypertension Father    Diabetes Father    Hyperlipidemia Father    Stroke Father    Hypertension Mother    Diabetes Mother    Hyperlipidemia Mother    Stroke Mother    Heart attack Mother    Hypertension Brother        MI, S/P MVR    SOCIAL HISTORY Social History   Tobacco Use   Smoking status: Former    Years: 8.00  Types: Cigarettes    Quit date: 10/12/1978    Years since quitting: 43.4   Smokeless tobacco: Never  Vaping Use   Vaping Use: Never used  Substance Use Topics   Alcohol use: No   Drug use: No         OPHTHALMIC EXAM:  Base Eye Exam     Visual Acuity (ETDRS)       Right Left   Dist cc 20/25 -2 20/400    Correction: Glasses         Tonometry (Tonopen, 10:55 AM)       Right Left   Pressure 14 12         Pupils       Pupils Dark Light React APD   Right PERRL 4 3 Brisk None   Left PERRL 4 3 Brisk None         Extraocular Movement       Right Left    Full Full         Neuro/Psych     Oriented x3: Yes   Mood/Affect: Normal         Dilation     Both eyes: 1.0% Mydriacyl, 2.5% Phenylephrine @ 10:55 AM           Slit Lamp and Fundus Exam     External Exam       Right Left   External Normal Normal         Slit Lamp Exam       Right Left   Lids/Lashes Normal Normal   Conjunctiva/Sclera White and quiet White and quiet   Cornea Clear Clear   Anterior Chamber Deep and quiet Deep and quiet   Iris Round and reactive Round and reactive   Lens Posterior chamber intraocular lens Posterior chamber intraocular lens   Anterior Vitreous  Normal Normal         Fundus Exam       Right Left   Posterior Vitreous Posterior vitreous detachment Posterior vitreous detachment   Disc Peripapillary atrophy Peripapillary atrophy   C/D Ratio 0.8 0.8   Macula Advanced age related macular degeneration, Atrophy, Retinal pigment epithelial atrophy, Disciform scar, [place], Mottling, Retinal pigment epithelial mottling, Retinal pigment epithelial atrophy - geographic, mature CNVM, Geographic atrophy SUP to FAZ Advanced age related macular degeneration, Atrophy, Retinal pigment epithelial atrophy, Disciform scar, [place], Mottling, Retinal pigment epithelial mottling, Retinal pigment epithelial atrophy - geographic, Geographic atrophy SUP to FAZ   Vessels Normal, , no DR Normal, , no DR   Periphery Normal Normal            IMAGING AND PROCEDURES  Imaging and Procedures for 04/01/22  OCT, Retina - OU - Both Eyes       Right Eye Quality was good. Scan locations included subfoveal. Central Foveal Thickness: 343. Progression has been stable. Findings include cystoid macular edema, disciform scar, intraretinal fluid, pigment epithelial detachment, subretinal fluid.   Left Eye Quality was good. Scan locations included subfoveal. Central Foveal Thickness: 307. Progression has been stable. Findings include disciform scar, central retinal atrophy, inner retinal atrophy, outer retinal atrophy.   Notes OD, chronically active yet stable, for over 17 years in this office currently at 6-week interval with resistant to prior medications, with less intraretinal fluid today  Intraretinal fluid OD superotemporally stable over time, stable over time due to mature subfoveal fibrotic CNVM.  Subretinal hyper reflective material appears to be correlating with subretinal hemorrhage      Intravitreal  Injection, Pharmacologic Agent - OD - Right Eye       Time Out 04/01/2022. 11:28 AM. Confirmed correct patient, procedure, site, and patient consented.    Anesthesia Topical anesthesia was used. Anesthetic medications included Lidocaine 4%.   Procedure Preparation included 5% betadine to ocular surface, 10% betadine to eyelids. A 30 gauge needle was used.   Injection: 0.5 mg Ranibizumab 0.5 MG/0.05ML   Route: Intravitreal, Site: Right Eye   NDC: [id number], Lot: [id number], Waste: 0 mL   Post-op Post injection exam found visual acuity of at least counting fingers. The patient tolerated the procedure well. There were no complications. The patient received written and verbal post procedure care education. Post injection medications included ocuflox.              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 Exudative age-related macular degeneration of right eye with active choroidal neovascularization (HCC)  H35.3211 OCT, Retina - OU - Both Eyes    Intravitreal Injection, Pharmacologic Agent - OD - Right Eye    Ranibizumab SOSY 0.5 mg    2. Advanced nonexudative age-related macular degeneration of right eye without subfoveal involvement  H35.3113     3. Exudative age-related macular degeneration of left eye with inactive scar ([place])  H35.3223       1.  OD, subfoveal chronic disciform scar with stable acuity and controlled intraretinal fluid inferotemporal to FAZ. maintain good acuity.  On chronic use of Lucentis.  Resistant to Eylea and Avastin in the past.  2.  Option in the future to change to Vabysmo, but unknown effect on progression of geographic atrophy.  3.  Possible candidate for Syfovre in the  future  Ophthalmic Meds Ordered this visit:  Meds ordered this encounter  Medications   Ranibizumab SOSY 0.5 mg       Return in about 6 weeks (around 05/13/2022) for dilate, OD, LUCENTIS 0.5 OCT.  There are no Patient Instructions on file for this visit.   Explained the diagnoses, plan, and follow up with the patient and they expressed understanding.  Patient expressed understanding of the importance of proper follow up care.   Clent Demark Sarahy Creedon M.D. Diseases & Surgery of the Retina and Vitreous [facility] [place] 04/01/22     Abbreviations: M myopia (nearsighted); A astigmatism; H hyperopia (farsighted); P presbyopia; Mrx spectacle prescription;  CTL contact lenses; OD right eye; OS left eye; OU both eyes  XT exotropia; ET esotropia; PEK punctate epithelial keratitis; PEE punctate epithelial erosions; DES dry eye syndrome; MGD meibomian gland dysfunction; ATs artificial tears; PFAT's preservative free artificial tears; [place] nuclear sclerotic cataract; PSC posterior subcapsular cataract; ERM epi-retinal membrane; PVD posterior vitreous detachment; RD retinal detachment; DM diabetes mellitus; DR diabetic retinopathy; NPDR non-proliferative diabetic retinopathy; PDR proliferative diabetic retinopathy; CSME clinically significant macular edema; DME diabetic macular edema; dbh dot blot hemorrhages; CWS cotton wool spot; POAG primary open angle glaucoma; C/D cup-to-disc ratio; HVF humphrey visual field; GVF goldmann visual field; OCT optical coherence tomography; IOP intraocular pressure; BRVO Branch retinal vein occlusion; CRVO central retinal vein occlusion; CRAO central retinal artery occlusion; BRAO branch retinal artery occlusion; RT retinal tear;  SB scleral buckle; PPV pars plana vitrectomy; VH Vitreous hemorrhage; PRP panretinal laser photocoagulation; IVK intravitreal kenalog; VMT vitreomacular traction; MH Macular hole;  NVD neovascularization of the disc; NVE neovascularization  elsewhere; AREDS age related eye disease study; ARMD age related macular degeneration; POAG primary open angle glaucoma; EBMD epithelial/anterior basement membrane dystrophy; ACIOL anterior chamber intraocular lens; IOL intraocular lens; PCIOL posterior chamber intraocular lens; Phaco/IOL phacoemulsification with intraocular lens placement; [place] photorefractive keratectomy; LASIK laser assisted in situ keratomileusis; HTN hypertension; DM diabetes mellitus; COPD chronic obstructive pulmonary disease

## 2022-04-01 NOTE — Assessment & Plan Note (Signed)
No change by OCT

## 2022-04-08 ENCOUNTER — Ambulatory Visit (INDEPENDENT_AMBULATORY_CARE_PROVIDER_SITE_OTHER): Payer: Medicare Other | Admitting: *Deleted

## 2022-04-08 DIAGNOSIS — Z5181 Encounter for therapeutic drug level monitoring: Secondary | ICD-10-CM

## 2022-04-08 DIAGNOSIS — Z952 Presence of prosthetic heart valve: Secondary | ICD-10-CM

## 2022-04-08 DIAGNOSIS — Z953 Presence of xenogenic heart valve: Secondary | ICD-10-CM | POA: Diagnosis not present

## 2022-04-08 DIAGNOSIS — I4821 Permanent atrial fibrillation: Secondary | ICD-10-CM

## 2022-04-08 LAB — POCT INR: INR: 2.4 (ref 2.0–3.0)

## 2022-04-08 NOTE — Patient Instructions (Signed)
Description   Continue taking Warfarin 1 tablet daily except 1/2 tablet on Sundays. Recheck INR in 6 weeks. Coumadin Clinic [phone].       

## 2022-05-13 ENCOUNTER — Ambulatory Visit (INDEPENDENT_AMBULATORY_CARE_PROVIDER_SITE_OTHER): Payer: Medicare Other | Admitting: Ophthalmology

## 2022-05-13 ENCOUNTER — Encounter (INDEPENDENT_AMBULATORY_CARE_PROVIDER_SITE_OTHER): Payer: Self-pay | Admitting: Ophthalmology

## 2022-05-13 DIAGNOSIS — H353113 Nonexudative age-related macular degeneration, right eye, advanced atrophic without subfoveal involvement: Secondary | ICD-10-CM

## 2022-05-13 DIAGNOSIS — H353211 Exudative age-related macular degeneration, right eye, with active choroidal neovascularization: Secondary | ICD-10-CM

## 2022-05-13 DIAGNOSIS — H353223 Exudative age-related macular degeneration, left eye, with inactive scar: Secondary | ICD-10-CM

## 2022-05-13 MED ORDER — RANIBIZUMAB 0.5 MG/0.05ML IZ SOSY
0.5000 mg | PREFILLED_SYRINGE | INTRAVITREAL | Status: AC | PRN
  Administered 2022-05-13: .5 mg via INTRAVITREAL

## 2022-05-13 NOTE — Assessment & Plan Note (Signed)
No signs of recurrence accounts for acuity

## 2022-05-13 NOTE — Progress Notes (Signed)
05/13/2022     CHIEF COMPLAINT Patient presents for  Chief Complaint  Patient presents with   Macular Degeneration      HISTORY OF PRESENT ILLNESS: Jason [name] is a 88 y.o. male who presents to the clinic today for:   HPI   6 weeks for DILATE OD, LUCENTIS 0.5 OCT. Pt stated no changes in vision since last visit.  Last edited by Silvestre Moment on 05/13/2022 10:31 AM.      Referring physician: Shirline Frees, MD [place],  [place] [id number]  HISTORICAL INFORMATION:   Selected notes from the MEDICAL RECORD NUMBER    Lab Results  Component Value Date   HGBA1C 5.4 08/11/2013     CURRENT MEDICATIONS: Current Outpatient Medications (Ophthalmic Drugs)  Medication Sig   ALPHAGAN P 0.1 % SOLN INSTILL 1 DROP INTO BOTH EYES TWICE A DAY   No current facility-administered medications for this visit. (Ophthalmic Drugs)   Current Outpatient Medications (Other)  Medication Sig   acetaminophen (TYLENOL) 650 MG CR tablet Take 650 mg by mouth 2 (two) times daily.    Ascorbic Acid (VITAMIN C) 1000 MG tablet Take 1,000 mg by mouth daily.   atorvastatin (LIPITOR) 20 MG tablet Take 20 mg by mouth at bedtime.    b complex vitamins tablet Take 1 tablet by mouth daily.   diltiazem (CARDIZEM CD) 120 MG 24 hr capsule TAKE 1 CAPSULE BY MOUTH EVERY DAY   furosemide (LASIX) 40 MG tablet 40 mg in the am and 20 mg in the pm   lisinopril (ZESTRIL) 10 MG tablet Take 10 mg by mouth daily.   metoprolol succinate (TOPROL-XL) 25 MG 24 hr tablet TAKE 1/2 TABLETS (12.5 MG TOTAL) BY MOUTH IN THE MORNING AND AT BEDTIME.   Multiple Vitamins-Minerals (PRESERVISION AREDS PO) Take 1 tablet by mouth 2 (two) times daily.    warfarin (COUMADIN) 5 MG tablet TAKE AS DIRECTED BY COUMADIN CLINIC   No current facility-administered medications for this visit. (Other)      REVIEW OF SYSTEMS: ROS   Negative for: Constitutional, Gastrointestinal, Neurological, Skin, Genitourinary,  Musculoskeletal, HENT, Endocrine, Cardiovascular, Eyes, Respiratory, Psychiatric, Allergic/Imm, Heme/Lymph Last edited by Silvestre Moment on 05/13/2022 10:31 AM.       ALLERGIES Allergies  Allergen Reactions   Penicillins Rash and Other (See Comments)    Has patient had a PCN reaction causing immediate rash, facial/tongue/throat swelling, SOB or lightheadedness with hypotension: # # Yes # # Has patient had a PCN reaction causing severe rash involving mucus membranes or skin necrosis: No Has patient had a PCN reaction that required hospitalization: No Has patient had a PCN reaction occurring within the last 10 years: No If all of the above answers are "NO", then may proceed with Cephalosporin use.     PAST MEDICAL HISTORY Past Medical History:  Diagnosis Date   AK (actinic keratosis)    Atrial fibrillation, persistent (HCC)    chronic coumadin therapy   Bifascicular block    Blindness    left eye   CKD (chronic kidney disease), stage III (HCC)    Coronary artery disease    20% RCA by 2014 cath   Elevated PSA    GERD (gastroesophageal reflux disease)    H/O hiatal hernia    Heart murmur    History of kidney stones    Hydrocele    left   Hyperlipidemia    Hypertension    Internal hemorrhoids    Kidney  stones    kidney stones   Macular degeneration    right eye   Mitral regurgitation 02/25/2011   Recurrent MR s/p mitral valve repair then bioprosthetic MVR 2014   OA (osteoarthritis) of neck    S/P aortic valve replacement with bioprosthetic valve 08/15/2013   61m Edwards Magna Ease bovine pericardial tissue valve   S/P mitral valve repair 09/15/2004   Complex valvuloplasty including artificial Goretex neocord placement x4 and 320mSARP ring annuloplasty via right mini thoracotomy approach [facility] S/P redo mitral valve replacement and aortic valve replacement with bioprosthetic valves 08/15/2013   [id number] [facility] bovine pericardial tissue valve   Severe  aortic stenosis 11/14   tissue AVR    Vocal cord paralysis    Past Surgical History:  Procedure Laterality Date   AORTIC VALVE REPLACEMENT N/A 08/15/2013   Procedure: AORTIC VALVE REPLACEMENT (AVR);  Surgeon: ClaRexene AlbertsD;  [facility];  Laterality: N/A;   CATARACT EXTRACTION Bilateral 2011   Dr. DigBing PlumeESOPHAGOGASTRODUODENOSCOPY N/A 08/02/2013   Procedure: ESOPHAGOGASTRODUODENOSCOPY (EGD);  Surgeon: JayCasandra DoffingD;  [facility];  Service: Cardiovascular;  Laterality: N/A;   EYE SURGERY Right    infection   GROIN MASS OPEN BIOPSY  2012   HEMORRHOID SURGERY     pt denies.   INGUINAL HERNIA REPAIR  08/2011   INTRAOPERATIVE TRANSESOPHAGEAL ECHOCARDIOGRAM N/A 08/15/2013   Procedure: INTRAOPERATIVE TRANSESOPHAGEAL ECHOCARDIOGRAM;  Surgeon: ClaRexene AlbertsD;  [facility] [place] [facility];  Laterality: N/A;   LEFT AND RIGHT HEART CATHETERIZATION WITH CORONARY ANGIOGRAM N/A 07/05/2013   Procedure: LEFT AND RIGHT HEART CATHETERIZATION WITH CORONARY ANGIOGRAM;  Surgeon: JayJettie BoozeD;  [facility];  Service: Cardiovascular;  Laterality: N/A;   MASS EXCISION Right 03/30/2018   Procedure: EXCISION RIGHT FLANK  MASS;  Surgeon: BlaCoralie KeensD;  [facility];  Laterality: Right;   MITRAL VALVE REPAIR  09/16/2011   @ [facility], Dr GloEvelina DunMITRAL VALVE REPAIR N/A 08/15/2013   Procedure: REDO MITRAL VALVE (MV) REPLACEMENT;  Surgeon: ClaRexene AlbertsD;  [facility] [place];  Laterality: N/A;   TEE WITHOUT CARDIOVERSION N/A 07/05/2013   Procedure: TRANSESOPHAGEAL ECHOCARDIOGRAM (TEE);  Surgeon: JayCasandra DoffingD;  [facility];  Service: Cardiovascular;  Laterality: N/A;   TEE WITHOUT CARDIOVERSION N/A 08/02/2013   Procedure: TRANSESOPHAGEAL ECHOCARDIOGRAM (TEE);  Surgeon: JayCasandra DoffingD;  [facility] [place];  Laterality: N/A;    UMBILICAL HERNIA REPAIR N/A 03/30/2018   Procedure: HERNIA REPAIR UMBILICAL;  Surgeon: BlaCoralie KeensD;  [facility] [place] [place];  Laterality: N/A;    FAMILY HISTORY Family History  Problem Relation Age of Onset   Cancer Father        colon   Hypertension Father    Diabetes Father    Hyperlipidemia Father    Stroke Father    Hypertension Mother    Diabetes Mother    Hyperlipidemia Mother    Stroke Mother    Heart attack Mother    Hypertension Brother        MI, S/P MVR    SOCIAL HISTORY Social History   Tobacco Use   Smoking status: Former    Years: 8.00    Types: Cigarettes    Quit date: 10/12/1978    Years since quitting: 43.80  Smokeless tobacco: Never  Vaping Use   Vaping Use: Never used  Substance Use Topics   Alcohol use: No   Drug use: No         OPHTHALMIC EXAM:  Base Eye Exam     Visual Acuity (ETDRS)       Right Left   Dist cc 20/25 -2 20/400   Dist ph cc  NI    Correction: Glasses         Tonometry (Tonopen, 10:35 AM)       Right Left   Pressure 16 17         Pupils       Pupils APD   Right PERRL None   Left PERRL None         Visual Fields       Left Right     Full   Restrictions Partial inner superior temporal, inferior temporal, superior nasal, inferior nasal deficiencies          Extraocular Movement       Right Left    Full, Ortho Full, Ortho         Neuro/Psych     Oriented x3: Yes   Mood/Affect: Normal         Dilation     Right eye: 1.0% Mydriacyl, 2.5% Phenylephrine @ 10:35 AM           Slit Lamp and Fundus Exam     External Exam       Right Left   External Normal Normal         Slit Lamp Exam       Right Left   Lids/Lashes Normal Normal   Conjunctiva/Sclera White and quiet White and quiet   Cornea Clear Clear   Anterior Chamber Deep and quiet Deep and quiet   Iris Round and reactive Round and reactive   Lens Posterior chamber intraocular lens Posterior  chamber intraocular lens   Anterior Vitreous Normal Normal         Fundus Exam       Right Left   Posterior Vitreous Posterior vitreous detachment    Disc Peripapillary atrophy    C/D Ratio 0.8    Macula Advanced age related macular degeneration, Atrophy, Retinal pigment epithelial atrophy, Disciform scar, [place], Mottling, Retinal pigment epithelial mottling, Retinal pigment epithelial atrophy - geographic, mature CNVM, Geographic atrophy SUP to FAZ    Vessels Normal, , no DR    Periphery Normal             IMAGING AND PROCEDURES  Imaging and Procedures for 05/13/22  OCT, Retina - OU - Both Eyes       Right Eye Quality was good. Scan locations included subfoveal. Central Foveal Thickness: 343. Progression has been stable. Findings include cystoid macular edema, disciform scar, intraretinal fluid, pigment epithelial detachment, subretinal fluid.   Left Eye Quality was borderline. Scan locations included subfoveal. Central Foveal Thickness: 307. Progression has been stable. Findings include disciform scar, central retinal atrophy, inner retinal atrophy, outer retinal atrophy.   Notes OD, chronically active yet stable, for over 18 years in this office currently at 6-week interval with resistant to prior medications, with less intraretinal fluid today  Intraretinal fluid OD superotemporally stable over time, stable over time due to mature subfoveal fibrotic CNVM.  Subretinal hyper reflective material appears to be correlating with subretinal hemorrhage      Intravitreal Injection, Pharmacologic Agent - OD - Right Eye  Time Out 05/13/2022. 10:59 AM. Confirmed correct patient, procedure, site, and patient consented.   Anesthesia Topical anesthesia was used. Anesthetic medications included Lidocaine 4%.   Procedure Preparation included 5% betadine to ocular surface, 10% betadine to eyelids. A 30 gauge needle was used.   Injection: 0.5 mg Ranibizumab 0.5  MG/0.05ML   Route: Intravitreal, Site: Right Eye   NDC: 50932-671-24, Lot: [id number], Expiration date: 09/12/2023, Waste: 0 mL   Post-op Post injection exam found visual acuity of at least counting fingers. The patient tolerated the procedure well. There were no complications. The patient received written and verbal post procedure care education. Post injection medications included ocuflox.              ASSESSMENT/PLAN:  Advanced nonexudative age-related macular degeneration of right eye without subfoveal involvement Perifoveal geographic atrophy stable  Exudative age-related macular degeneration of right eye with active choroidal neovascularization (HCC) Chronic CNVM subfoveal is the new blood supply to the macula with excellent acuity maintained at 6-week interval post Lucentis resistant to other medications.  Exudative age-related macular degeneration of left eye with inactive scar ([place]) No signs of recurrence accounts for acuity     ICD-10-CM   1. Exudative age-related macular degeneration of right eye with active choroidal neovascularization (HCC)  H35.3211 OCT, Retina - OU - Both Eyes    Intravitreal Injection, Pharmacologic Agent - OD - Right Eye    Ranibizumab SOSY 0.5 mg    2. Advanced nonexudative age-related macular degeneration of right eye without subfoveal involvement  H35.3113     3. Exudative age-related macular degeneration of left eye with inactive scar ([place])  H35.3223       1.  OD doing well currently on 6-week interval post Lucentis injection.  With no significant impact on geographic atrophy progression.  We will thus continue with Lucentis and try to avoid Vabysmo less resistance develops this medication  2.  3.  Ophthalmic Meds Ordered this visit:  Meds ordered this encounter  Medications   Ranibizumab SOSY 0.5 mg       Return in about 6 weeks (around 06/24/2022) for dilate, OD, LUCENTIS 0.5 OCT.  There are no Patient Instructions on file for  this visit.   Explained the diagnoses, plan, and follow up with the patient and they expressed understanding.  Patient expressed understanding of the importance of proper follow up care.   Clent Demark Lichelle Viets M.D. Diseases & Surgery of the Retina and Vitreous [facility] [place] 05/13/22     Abbreviations: M myopia (nearsighted); A astigmatism; H hyperopia (farsighted); P presbyopia; Mrx spectacle prescription;  CTL contact lenses; OD right eye; OS left eye; OU both eyes  XT exotropia; ET esotropia; PEK punctate epithelial keratitis; PEE punctate epithelial erosions; DES dry eye syndrome; MGD meibomian gland dysfunction; ATs artificial tears; PFAT's preservative free artificial tears; [place] nuclear sclerotic cataract; PSC posterior subcapsular cataract; ERM epi-retinal membrane; PVD posterior vitreous detachment; RD retinal detachment; DM diabetes mellitus; DR diabetic retinopathy; NPDR non-proliferative diabetic retinopathy; PDR proliferative diabetic retinopathy; CSME clinically significant macular edema; DME diabetic macular edema; dbh dot blot hemorrhages; CWS cotton wool spot; POAG primary open angle glaucoma; C/D cup-to-disc ratio; HVF humphrey visual field; GVF goldmann visual field; OCT optical coherence tomography; IOP intraocular pressure; BRVO Branch retinal vein occlusion; CRVO central retinal vein occlusion; CRAO central retinal artery occlusion; BRAO branch retinal artery occlusion; RT retinal tear; SB scleral buckle; PPV pars plana vitrectomy; VH Vitreous hemorrhage; PRP panretinal laser photocoagulation; IVK intravitreal kenalog; VMT  vitreomacular traction; MH Macular hole;  NVD neovascularization of the disc; NVE neovascularization elsewhere; AREDS age related eye disease study; ARMD age related macular degeneration; POAG primary open angle glaucoma; EBMD epithelial/anterior basement membrane dystrophy; ACIOL anterior chamber intraocular lens; IOL intraocular lens; PCIOL posterior  chamber intraocular lens; Phaco/IOL phacoemulsification with intraocular lens placement; [place] photorefractive keratectomy; LASIK laser assisted in situ keratomileusis; HTN hypertension; DM diabetes mellitus; COPD chronic obstructive pulmonary disease

## 2022-05-13 NOTE — Assessment & Plan Note (Signed)
Perifoveal geographic atrophy stable

## 2022-05-13 NOTE — Assessment & Plan Note (Signed)
Chronic CNVM subfoveal is the new blood supply to the macula with excellent acuity maintained at 6-week interval post Lucentis resistant to other medications.

## 2022-05-20 ENCOUNTER — Ambulatory Visit (INDEPENDENT_AMBULATORY_CARE_PROVIDER_SITE_OTHER): Payer: Medicare Other | Admitting: *Deleted

## 2022-05-20 DIAGNOSIS — L578 Other skin changes due to chronic exposure to nonionizing radiation: Secondary | ICD-10-CM | POA: Diagnosis not present

## 2022-05-20 DIAGNOSIS — L570 Actinic keratosis: Secondary | ICD-10-CM | POA: Diagnosis not present

## 2022-05-20 DIAGNOSIS — I4821 Permanent atrial fibrillation: Secondary | ICD-10-CM | POA: Diagnosis not present

## 2022-05-20 DIAGNOSIS — L821 Other seborrheic keratosis: Secondary | ICD-10-CM | POA: Diagnosis not present

## 2022-05-20 DIAGNOSIS — Z953 Presence of xenogenic heart valve: Secondary | ICD-10-CM

## 2022-05-20 DIAGNOSIS — D2261 Melanocytic nevi of right upper limb, including shoulder: Secondary | ICD-10-CM | POA: Diagnosis not present

## 2022-05-20 DIAGNOSIS — Z5181 Encounter for therapeutic drug level monitoring: Secondary | ICD-10-CM | POA: Diagnosis not present

## 2022-05-20 DIAGNOSIS — Z952 Presence of prosthetic heart valve: Secondary | ICD-10-CM

## 2022-05-20 DIAGNOSIS — D2262 Melanocytic nevi of left upper limb, including shoulder: Secondary | ICD-10-CM | POA: Diagnosis not present

## 2022-05-20 LAB — POCT INR: INR: 2.5 (ref 2.0–3.0)

## 2022-05-20 NOTE — Patient Instructions (Signed)
Description   Continue taking Warfarin 1 tablet daily except 1/2 tablet on Sundays. Recheck INR in 6 weeks. Coumadin Clinic [phone].       

## 2022-05-22 DIAGNOSIS — H6692 Otitis media, unspecified, left ear: Secondary | ICD-10-CM | POA: Diagnosis not present

## 2022-05-22 DIAGNOSIS — K1230 Oral mucositis (ulcerative), unspecified: Secondary | ICD-10-CM | POA: Diagnosis not present

## 2022-06-11 DIAGNOSIS — Z0000 Encounter for general adult medical examination without abnormal findings: Secondary | ICD-10-CM | POA: Diagnosis not present

## 2022-06-11 DIAGNOSIS — E782 Mixed hyperlipidemia: Secondary | ICD-10-CM | POA: Diagnosis not present

## 2022-06-11 DIAGNOSIS — Z23 Encounter for immunization: Secondary | ICD-10-CM | POA: Diagnosis not present

## 2022-06-11 DIAGNOSIS — H6980 Other specified disorders of Eustachian tube, unspecified ear: Secondary | ICD-10-CM | POA: Diagnosis not present

## 2022-06-11 DIAGNOSIS — I10 Essential (primary) hypertension: Secondary | ICD-10-CM | POA: Diagnosis not present

## 2022-06-11 DIAGNOSIS — Z8679 Personal history of other diseases of the circulatory system: Secondary | ICD-10-CM | POA: Diagnosis not present

## 2022-06-11 DIAGNOSIS — Z952 Presence of prosthetic heart valve: Secondary | ICD-10-CM | POA: Diagnosis not present

## 2022-06-24 ENCOUNTER — Ambulatory Visit (INDEPENDENT_AMBULATORY_CARE_PROVIDER_SITE_OTHER): Payer: Medicare Other | Admitting: Ophthalmology

## 2022-06-24 ENCOUNTER — Encounter (INDEPENDENT_AMBULATORY_CARE_PROVIDER_SITE_OTHER): Payer: Self-pay | Admitting: Ophthalmology

## 2022-06-24 DIAGNOSIS — H353211 Exudative age-related macular degeneration, right eye, with active choroidal neovascularization: Secondary | ICD-10-CM

## 2022-06-24 DIAGNOSIS — H353223 Exudative age-related macular degeneration, left eye, with inactive scar: Secondary | ICD-10-CM | POA: Diagnosis not present

## 2022-06-24 MED ORDER — RANIBIZUMAB 0.5 MG/0.05ML IZ SOSY
0.5000 mg | PREFILLED_SYRINGE | INTRAVITREAL | Status: AC | PRN
  Administered 2022-06-24: .5 mg via INTRAVITREAL

## 2022-06-24 NOTE — Assessment & Plan Note (Signed)
Accounts for acuity, no change over time with atrophic changes macula centrally

## 2022-06-24 NOTE — Progress Notes (Signed)
06/24/2022     CHIEF COMPLAINT Patient presents for  Chief Complaint  Patient presents with   Macular Degeneration      HISTORY OF PRESENT ILLNESS: Jason [name] is a 88 y.o. male who presents to the clinic today for:   HPI   6 weeks for DILATE, OD, LUCENTIS 0.5 OCT. Pt stated vision has remained stable.  Last edited by Silvestre Moment on 06/24/2022 10:51 AM.      Referring physician: Shirline Frees, MD [place] [place],  [place] [address]  HISTORICAL INFORMATION:   Selected notes from the MEDICAL RECORD NUMBER    Lab Results  Component Value Date   HGBA1C 5.4 08/11/2013     CURRENT MEDICATIONS: Current Outpatient Medications (Ophthalmic Drugs)  Medication Sig   ALPHAGAN P 0.1 % SOLN INSTILL 1 DROP INTO BOTH EYES TWICE A DAY   No current facility-administered medications for this visit. (Ophthalmic Drugs)   Current Outpatient Medications (Other)  Medication Sig   acetaminophen (TYLENOL) 650 MG CR tablet Take 650 mg by mouth 2 (two) times daily.    Ascorbic Acid (VITAMIN C) 1000 MG tablet Take 1,000 mg by mouth daily.   atorvastatin (LIPITOR) 20 MG tablet Take 20 mg by mouth at bedtime.    b complex vitamins tablet Take 1 tablet by mouth daily.   diltiazem (CARDIZEM CD) 120 MG 24 hr capsule TAKE 1 CAPSULE BY MOUTH EVERY DAY   furosemide (LASIX) 40 MG tablet 40 mg in the am and 20 mg in the pm   lisinopril (ZESTRIL) 10 MG tablet Take 10 mg by mouth daily.   metoprolol succinate (TOPROL-XL) 25 MG 24 hr tablet TAKE 1/2 TABLETS (12.5 MG TOTAL) BY MOUTH IN THE MORNING AND AT BEDTIME.   Multiple Vitamins-Minerals (PRESERVISION AREDS PO) Take 1 tablet by mouth 2 (two) times daily.    warfarin (COUMADIN) 5 MG tablet TAKE AS DIRECTED BY COUMADIN CLINIC   No current facility-administered medications for this visit. (Other)      REVIEW OF SYSTEMS: ROS   Negative for: Constitutional, Gastrointestinal, Neurological, Skin, Genitourinary, Musculoskeletal,  HENT, Endocrine, Cardiovascular, Eyes, Respiratory, Psychiatric, Allergic/Imm, Heme/Lymph Last edited by Silvestre Moment on 06/24/2022 10:51 AM.       ALLERGIES Allergies  Allergen Reactions   Penicillins Rash and Other (See Comments)    Has patient had a PCN reaction causing immediate rash, facial/tongue/throat swelling, SOB or lightheadedness with hypotension: # # Yes # # Has patient had a PCN reaction causing severe rash involving mucus membranes or skin necrosis: No Has patient had a PCN reaction that required hospitalization: No Has patient had a PCN reaction occurring within the last 10 years: No If all of the above answers are "NO", then may proceed with Cephalosporin use.     PAST MEDICAL HISTORY Past Medical History:  Diagnosis Date   AK (actinic keratosis)    Atrial fibrillation, persistent (HCC)    chronic coumadin therapy   Bifascicular block    Blindness    left eye   CKD (chronic kidney disease), stage III (HCC)    Coronary artery disease    20% RCA by 2014 cath   Elevated PSA    GERD (gastroesophageal reflux disease)    H/O hiatal hernia    Heart murmur    History of kidney stones    Hydrocele    left   Hyperlipidemia    Hypertension    Internal hemorrhoids    Kidney stones  kidney stones   Macular degeneration    right eye   Mitral regurgitation 02/25/2011   Recurrent MR s/p mitral valve repair then bioprosthetic MVR 2014   OA (osteoarthritis) of neck    S/P aortic valve replacement with bioprosthetic valve 08/15/2013   65m Edwards Magna Ease bovine pericardial tissue valve   S/P mitral valve repair 09/15/2004   Complex valvuloplasty including artificial Goretex neocord placement x4 and 367mSARP ring annuloplasty via right mini thoracotomy approach [facility] S/P redo mitral valve replacement and aortic valve replacement with bioprosthetic valves 08/15/2013   2762mdwards [facility] bovine pericardial tissue valve   Severe aortic stenosis 11/14    tissue AVR    Vocal cord paralysis    Past Surgical History:  Procedure Laterality Date   AORTIC VALVE REPLACEMENT N/A 08/15/2013   Procedure: AORTIC VALVE REPLACEMENT (AVR);  Surgeon: ClaRexene AlbertsD;  [facility];  Laterality: N/A;   CATARACT EXTRACTION Bilateral 2011   Dr. DigBing PlumeESOPHAGOGASTRODUODENOSCOPY N/A 08/02/2013   Procedure: ESOPHAGOGASTRODUODENOSCOPY (EGD);  Surgeon: JayCasandra DoffingD;  [facility];  Service: Cardiovascular;  Laterality: N/A;   EYE SURGERY Right    infection   GROIN MASS OPEN BIOPSY  2012   HEMORRHOID SURGERY     pt denies.   INGUINAL HERNIA REPAIR  08/2011   INTRAOPERATIVE TRANSESOPHAGEAL ECHOCARDIOGRAM N/A 08/15/2013   Procedure: INTRAOPERATIVE TRANSESOPHAGEAL ECHOCARDIOGRAM;  Surgeon: ClaRexene AlbertsD;  [facility] [place];  Laterality: N/A;   LEFT AND RIGHT HEART CATHETERIZATION WITH CORONARY ANGIOGRAM N/A 07/05/2013   Procedure: LEFT AND RIGHT HEART CATHETERIZATION WITH CORONARY ANGIOGRAM;  Surgeon: JayJettie BoozeD;  [facility];  Service: Cardiovascular;  Laterality: N/A;   MASS EXCISION Right 03/30/2018   Procedure: EXCISION RIGHT FLANK  MASS;  Surgeon: BlaCoralie KeensD;  [facility] [place];  Laterality: Right;   MITRAL VALVE REPAIR  09/16/2011   @ [facility], Dr GloEvelina DunMITRAL VALVE REPAIR N/A 08/15/2013   Procedure: REDO MITRAL VALVE (MV) REPLACEMENT;  Surgeon: ClaRexene AlbertsD;  [facility] [place] [place] [facility];  Laterality: N/A;   TEE WITHOUT CARDIOVERSION N/A 07/05/2013   Procedure: TRANSESOPHAGEAL ECHOCARDIOGRAM (TEE);  Surgeon: JayCasandra DoffingD;  [facility];  Service: Cardiovascular;  Laterality: N/A;   TEE WITHOUT CARDIOVERSION N/A 08/02/2013   Procedure: TRANSESOPHAGEAL ECHOCARDIOGRAM (TEE);  Surgeon: JayCasandra DoffingD;  [facility] [place];  Laterality: N/A;   UMBILICAL HERNIA  REPAIR N/A 03/30/2018   Procedure: HERNIA REPAIR UMBILICAL;  Surgeon: BlaCoralie KeensD;  [facility] [place];  Laterality: N/A;    FAMILY HISTORY Family History  Problem Relation Age of Onset   Cancer Father        colon   Hypertension Father    Diabetes Father    Hyperlipidemia Father    Stroke Father    Hypertension Mother    Diabetes Mother    Hyperlipidemia Mother    Stroke Mother    Heart attack Mother    Hypertension Brother        MI, S/P MVR    SOCIAL HISTORY Social History   Tobacco Use   Smoking status: Former    Years: 8.00    Types: Cigarettes    Quit date: 10/12/1978    Years since quitting: 43.7   Smokeless tobacco: Never  Vaping Use   Vaping Use: Never used  Substance Use Topics   Alcohol use: No   Drug use: No         OPHTHALMIC EXAM:  Base Eye Exam     Visual Acuity (ETDRS)       Right Left   Dist [place] 20/30 20/400   Dist ph [place] 20/25 -1 NI    Correction: Glasses         Tonometry (Tonopen, 10:55 AM)       Right Left   Pressure 11 14         Pupils       Pupils APD   Right PERRL None   Left PERRL None         Visual Fields       Left Right   Restrictions Partial inner superior temporal, inferior temporal, superior nasal, inferior nasal deficiencies          Extraocular Movement       Right Left    Full Full         Neuro/Psych     Oriented x3: Yes   Mood/Affect: Normal         Dilation     Right eye: 2.5% Phenylephrine, 1.0% Mydriacyl @ 10:55 AM           Slit Lamp and Fundus Exam     External Exam       Right Left   External Normal Normal         Slit Lamp Exam       Right Left   Lids/Lashes Normal Normal   Conjunctiva/Sclera White and quiet White and quiet   Cornea Clear Clear   Anterior Chamber Deep and quiet Deep and quiet   Iris Round and reactive Round and reactive   Lens Posterior chamber intraocular lens Posterior chamber intraocular lens   Anterior  Vitreous Normal Normal         Fundus Exam       Right Left   Posterior Vitreous Posterior vitreous detachment    Disc Peripapillary atrophy    C/D Ratio 0.8    Macula Advanced age related macular degeneration, Atrophy, Retinal pigment epithelial atrophy, Disciform scar, [place], Mottling, Retinal pigment epithelial mottling, Retinal pigment epithelial atrophy - geographic, mature CNVM, Geographic atrophy SUP to FAZ    Vessels Normal, , no DR    Periphery Normal             IMAGING AND PROCEDURES  Imaging and Procedures for 06/24/22  OCT, Retina - OU - Both Eyes       Right Eye Quality was good. Scan locations included subfoveal. Central Foveal Thickness: 330. Progression has been stable. Findings include cystoid macular edema, disciform scar, intraretinal fluid, pigment epithelial detachment, subretinal fluid.   Left Eye Quality was borderline. Scan locations included subfoveal. Central Foveal Thickness: 307. Progression has been stable. Findings include disciform scar, central retinal atrophy, inner retinal atrophy, outer retinal atrophy.   Notes OD, chronically active yet stable, for over 18 years in this office currently at 6-week interval with resistant to prior medications, with less intraretinal fluid today  Intraretinal fluid OD superotemporally stable over time, stable over time due to mature subfoveal fibrotic CNVM.  Subretinal hyper reflective material appears to be correlating with subretinal hemorrhage     Intravitreal Injection, Pharmacologic Agent - OD - Right Eye       Time Out 06/24/2022. 11:39 AM. Confirmed correct patient, procedure, site, and  patient consented.   Anesthesia Topical anesthesia was used. Anesthetic medications included Lidocaine 4%.   Procedure Preparation included 5% betadine to ocular surface, 10% betadine to eyelids. A 30 gauge needle was used.   Injection: 0.5 mg Ranibizumab 0.5 MG/0.05ML   Route: Intravitreal, Site: Right  Eye   NDC: [id number], Lot: [id number], Waste: 0 mL   Post-op Post injection exam found visual acuity of at least counting fingers. The patient tolerated the procedure well. There were no complications. The patient received written and verbal post procedure care education. Post injection medications included ocuflox.              ASSESSMENT/PLAN:  Exudative age-related macular degeneration of right eye with active choroidal neovascularization ([place]) Vastly improved OD over time.  Stabilized with excellent acuity maintain current 6-week interval.  Repeat injection Lucentis today reevaluate again in 6 to 7 weeks  Exudative age-related macular degeneration of left eye with inactive scar ([place]) Accounts for acuity, no change over time with atrophic changes macula centrally     ICD-10-CM   1. Exudative age-related macular degeneration of right eye with active choroidal neovascularization (HCC)  H35.3211 OCT, Retina - OU - Both Eyes    Intravitreal Injection, Pharmacologic Agent - OD - Right Eye    Ranibizumab SOSY 0.5 mg    2. Exudative age-related macular degeneration of left eye with inactive scar ([place])  H35.3223       1.  Slight retention of good acuity centrally with subfoveal mature disciform scar OD.  Stabilized and reactivation controlled by Lucentis only OD.  Repeat injection today at current 6-week interval exam.  Reevaluate next in 6 weeks  2.  3.  Ophthalmic Meds Ordered this visit:  Meds ordered this encounter  Medications   Ranibizumab SOSY 0.5 mg       Return in about 6 weeks (around 08/05/2022) for dilate, OD, LUCENTIS 0.5 OCT.  There are no Patient Instructions on file for this visit.   Explained the diagnoses, plan, and follow up with the patient and they expressed understanding.  Patient expressed understanding of the importance of proper follow up care.   Clent Demark Shaquavia Whisonant M.D. Diseases & Surgery of the Retina and Vitreous [facility]ina [facility] Diabetic [place] 06/24/22     Abbreviations: M myopia (nearsighted); A astigmatism; H hyperopia (farsighted); P presbyopia; Mrx spectacle prescription;  CTL contact lenses; OD right eye; OS left eye; OU both eyes  XT exotropia; ET esotropia; PEK punctate epithelial keratitis; PEE punctate epithelial erosions; DES dry eye syndrome; MGD meibomian gland dysfunction; ATs artificial tears; PFAT's preservative free artificial tears; [place] nuclear sclerotic cataract; PSC posterior subcapsular cataract; ERM epi-retinal membrane; PVD posterior vitreous detachment; RD retinal detachment; DM diabetes mellitus; DR diabetic retinopathy; NPDR non-proliferative diabetic retinopathy; PDR proliferative diabetic retinopathy; CSME clinically significant macular edema; DME diabetic macular edema; dbh dot blot hemorrhages; CWS cotton wool spot; POAG primary open angle glaucoma; C/D cup-to-disc ratio; HVF humphrey visual field; GVF goldmann visual field; OCT optical coherence tomography; IOP intraocular pressure; BRVO Branch retinal vein occlusion; CRVO central retinal vein occlusion; CRAO central retinal artery occlusion; BRAO branch retinal artery occlusion; RT retinal tear; SB scleral buckle; PPV pars plana vitrectomy; VH Vitreous hemorrhage; PRP panretinal laser photocoagulation; IVK intravitreal kenalog; VMT vitreomacular traction; MH Macular hole;  NVD neovascularization of the disc; NVE neovascularization elsewhere; AREDS age related eye disease study; ARMD age related macular degeneration; POAG primary open angle glaucoma; EBMD epithelial/anterior basement membrane dystrophy; ACIOL anterior chamber intraocular lens; IOL  intraocular lens; PCIOL posterior chamber intraocular lens; Phaco/IOL phacoemulsification with intraocular lens placement; [place] photorefractive keratectomy; LASIK laser assisted in situ keratomileusis; HTN hypertension; DM diabetes mellitus; COPD chronic obstructive pulmonary disease

## 2022-06-24 NOTE — Assessment & Plan Note (Signed)
Vastly improved OD over time.  Stabilized with excellent acuity maintain current 6-week interval.  Repeat injection Lucentis today reevaluate again in 6 to 7 weeks

## 2022-07-01 ENCOUNTER — Ambulatory Visit: Payer: Medicare Other | Attending: Cardiovascular Disease

## 2022-07-01 DIAGNOSIS — Z952 Presence of prosthetic heart valve: Secondary | ICD-10-CM | POA: Diagnosis not present

## 2022-07-01 DIAGNOSIS — Z953 Presence of xenogenic heart valve: Secondary | ICD-10-CM | POA: Diagnosis not present

## 2022-07-01 DIAGNOSIS — Z5181 Encounter for therapeutic drug level monitoring: Secondary | ICD-10-CM | POA: Insufficient documentation

## 2022-07-01 DIAGNOSIS — I4821 Permanent atrial fibrillation: Secondary | ICD-10-CM | POA: Diagnosis not present

## 2022-07-01 LAB — POCT INR: INR: 2.5 (ref 2.0–3.0)

## 2022-07-01 NOTE — Patient Instructions (Signed)
Description   Continue taking Warfarin 1 tablet daily except 1/2 tablet on Sundays.  Recheck INR in 7 weeks. Coumadin Clinic [phone].       

## 2022-08-05 ENCOUNTER — Encounter (INDEPENDENT_AMBULATORY_CARE_PROVIDER_SITE_OTHER): Payer: Medicare Other | Admitting: Ophthalmology

## 2022-08-05 DIAGNOSIS — H353113 Nonexudative age-related macular degeneration, right eye, advanced atrophic without subfoveal involvement: Secondary | ICD-10-CM | POA: Diagnosis not present

## 2022-08-05 DIAGNOSIS — H353211 Exudative age-related macular degeneration, right eye, with active choroidal neovascularization: Secondary | ICD-10-CM | POA: Diagnosis not present

## 2022-08-05 DIAGNOSIS — H40013 Open angle with borderline findings, low risk, bilateral: Secondary | ICD-10-CM | POA: Diagnosis not present

## 2022-08-05 DIAGNOSIS — H353222 Exudative age-related macular degeneration, left eye, with inactive choroidal neovascularization: Secondary | ICD-10-CM | POA: Diagnosis not present

## 2022-08-05 DIAGNOSIS — H35351 Cystoid macular degeneration, right eye: Secondary | ICD-10-CM | POA: Diagnosis not present

## 2022-08-05 DIAGNOSIS — H18423 Band keratopathy, bilateral: Secondary | ICD-10-CM | POA: Diagnosis not present

## 2022-08-19 ENCOUNTER — Ambulatory Visit: Payer: Medicare Other | Attending: Interventional Cardiology | Admitting: *Deleted

## 2022-08-19 DIAGNOSIS — Z952 Presence of prosthetic heart valve: Secondary | ICD-10-CM | POA: Diagnosis not present

## 2022-08-19 DIAGNOSIS — I4821 Permanent atrial fibrillation: Secondary | ICD-10-CM

## 2022-08-19 DIAGNOSIS — Z953 Presence of xenogenic heart valve: Secondary | ICD-10-CM

## 2022-08-19 DIAGNOSIS — Z5181 Encounter for therapeutic drug level monitoring: Secondary | ICD-10-CM

## 2022-08-19 LAB — POCT INR: INR: 3.5 — AB (ref 2.0–3.0)

## 2022-08-19 NOTE — Patient Instructions (Signed)
Description   Do not take any warfarin today then continue taking Warfarin 1 tablet daily except 1/2 tablet on Sundays. Recheck INR in 5 weeks. Coumadin Clinic [phone].

## 2022-09-16 DIAGNOSIS — H35351 Cystoid macular degeneration, right eye: Secondary | ICD-10-CM | POA: Diagnosis not present

## 2022-09-16 DIAGNOSIS — H18423 Band keratopathy, bilateral: Secondary | ICD-10-CM | POA: Diagnosis not present

## 2022-09-16 DIAGNOSIS — H401132 Primary open-angle glaucoma, bilateral, moderate stage: Secondary | ICD-10-CM | POA: Diagnosis not present

## 2022-09-16 DIAGNOSIS — H353113 Nonexudative age-related macular degeneration, right eye, advanced atrophic without subfoveal involvement: Secondary | ICD-10-CM | POA: Diagnosis not present

## 2022-09-16 DIAGNOSIS — H353222 Exudative age-related macular degeneration, left eye, with inactive choroidal neovascularization: Secondary | ICD-10-CM | POA: Diagnosis not present

## 2022-09-16 DIAGNOSIS — H353211 Exudative age-related macular degeneration, right eye, with active choroidal neovascularization: Secondary | ICD-10-CM | POA: Diagnosis not present

## 2022-09-23 ENCOUNTER — Ambulatory Visit: Payer: Medicare Other | Attending: Cardiovascular Disease

## 2022-09-23 DIAGNOSIS — Z952 Presence of prosthetic heart valve: Secondary | ICD-10-CM

## 2022-09-23 DIAGNOSIS — Z5181 Encounter for therapeutic drug level monitoring: Secondary | ICD-10-CM | POA: Diagnosis not present

## 2022-09-23 DIAGNOSIS — Z953 Presence of xenogenic heart valve: Secondary | ICD-10-CM | POA: Diagnosis not present

## 2022-09-23 LAB — POCT INR: INR: 2.1 (ref 2.0–3.0)

## 2022-09-23 NOTE — Patient Instructions (Signed)
Description   Continue taking Warfarin 1 tablet daily except 1/2 tablet on Sundays. Recheck INR in 6 weeks. Coumadin Clinic [phone].

## 2022-11-02 DIAGNOSIS — H35351 Cystoid macular degeneration, right eye: Secondary | ICD-10-CM | POA: Diagnosis not present

## 2022-11-02 DIAGNOSIS — H401132 Primary open-angle glaucoma, bilateral, moderate stage: Secondary | ICD-10-CM | POA: Diagnosis not present

## 2022-11-02 DIAGNOSIS — H353113 Nonexudative age-related macular degeneration, right eye, advanced atrophic without subfoveal involvement: Secondary | ICD-10-CM | POA: Diagnosis not present

## 2022-11-02 DIAGNOSIS — H40013 Open angle with borderline findings, low risk, bilateral: Secondary | ICD-10-CM | POA: Diagnosis not present

## 2022-11-02 DIAGNOSIS — H353222 Exudative age-related macular degeneration, left eye, with inactive choroidal neovascularization: Secondary | ICD-10-CM | POA: Diagnosis not present

## 2022-11-02 DIAGNOSIS — H18423 Band keratopathy, bilateral: Secondary | ICD-10-CM | POA: Diagnosis not present

## 2022-11-02 DIAGNOSIS — H353211 Exudative age-related macular degeneration, right eye, with active choroidal neovascularization: Secondary | ICD-10-CM | POA: Diagnosis not present

## 2022-11-04 ENCOUNTER — Ambulatory Visit: Payer: Medicare Other

## 2022-11-12 ENCOUNTER — Ambulatory Visit: Payer: Medicare Other | Attending: Internal Medicine

## 2022-11-12 DIAGNOSIS — Z953 Presence of xenogenic heart valve: Secondary | ICD-10-CM | POA: Diagnosis not present

## 2022-11-12 DIAGNOSIS — Z5181 Encounter for therapeutic drug level monitoring: Secondary | ICD-10-CM | POA: Insufficient documentation

## 2022-11-12 DIAGNOSIS — Z952 Presence of prosthetic heart valve: Secondary | ICD-10-CM

## 2022-11-12 LAB — POCT INR: INR: 2.6 (ref 2.0–3.0)

## 2022-11-12 NOTE — Patient Instructions (Signed)
Description   Continue taking Warfarin 1 tablet daily except 1/2 tablet on Sundays.  Recheck INR in 7 weeks. Coumadin Clinic [phone].

## 2022-11-23 DIAGNOSIS — Z85828 Personal history of other malignant neoplasm of skin: Secondary | ICD-10-CM | POA: Diagnosis not present

## 2022-11-23 DIAGNOSIS — C44319 Basal cell carcinoma of skin of other parts of face: Secondary | ICD-10-CM | POA: Diagnosis not present

## 2022-11-23 DIAGNOSIS — L821 Other seborrheic keratosis: Secondary | ICD-10-CM | POA: Diagnosis not present

## 2022-11-23 DIAGNOSIS — Z08 Encounter for follow-up examination after completed treatment for malignant neoplasm: Secondary | ICD-10-CM | POA: Diagnosis not present

## 2022-11-23 DIAGNOSIS — L814 Other melanin hyperpigmentation: Secondary | ICD-10-CM | POA: Diagnosis not present

## 2022-11-23 DIAGNOSIS — L570 Actinic keratosis: Secondary | ICD-10-CM | POA: Diagnosis not present

## 2022-11-23 DIAGNOSIS — D225 Melanocytic nevi of trunk: Secondary | ICD-10-CM | POA: Diagnosis not present

## 2022-11-23 DIAGNOSIS — D492 Neoplasm of unspecified behavior of bone, soft tissue, and skin: Secondary | ICD-10-CM | POA: Diagnosis not present

## 2022-12-08 DIAGNOSIS — H9193 Unspecified hearing loss, bilateral: Secondary | ICD-10-CM | POA: Diagnosis not present

## 2022-12-14 DIAGNOSIS — H353222 Exudative age-related macular degeneration, left eye, with inactive choroidal neovascularization: Secondary | ICD-10-CM | POA: Diagnosis not present

## 2022-12-14 DIAGNOSIS — H353211 Exudative age-related macular degeneration, right eye, with active choroidal neovascularization: Secondary | ICD-10-CM | POA: Diagnosis not present

## 2022-12-14 DIAGNOSIS — H401132 Primary open-angle glaucoma, bilateral, moderate stage: Secondary | ICD-10-CM | POA: Diagnosis not present

## 2022-12-14 DIAGNOSIS — H18423 Band keratopathy, bilateral: Secondary | ICD-10-CM | POA: Diagnosis not present

## 2022-12-16 DIAGNOSIS — C44319 Basal cell carcinoma of skin of other parts of face: Secondary | ICD-10-CM | POA: Diagnosis not present

## 2022-12-16 DIAGNOSIS — L308 Other specified dermatitis: Secondary | ICD-10-CM | POA: Diagnosis not present

## 2022-12-31 ENCOUNTER — Ambulatory Visit: Payer: Medicare Other | Attending: Cardiology | Admitting: *Deleted

## 2022-12-31 DIAGNOSIS — Z953 Presence of xenogenic heart valve: Secondary | ICD-10-CM | POA: Insufficient documentation

## 2022-12-31 DIAGNOSIS — Z5181 Encounter for therapeutic drug level monitoring: Secondary | ICD-10-CM | POA: Diagnosis not present

## 2022-12-31 DIAGNOSIS — Z952 Presence of prosthetic heart valve: Secondary | ICD-10-CM | POA: Insufficient documentation

## 2022-12-31 DIAGNOSIS — I4821 Permanent atrial fibrillation: Secondary | ICD-10-CM | POA: Diagnosis not present

## 2022-12-31 LAB — POCT INR: INR: 2.7 (ref 2.0–3.0)

## 2022-12-31 NOTE — Patient Instructions (Signed)
Description   Continue taking Warfarin 1 tablet daily except 1/2 tablet on Sundays.  Recheck INR in 8 weeks. Coumadin Clinic [phone].

## 2023-01-21 DIAGNOSIS — H903 Sensorineural hearing loss, bilateral: Secondary | ICD-10-CM | POA: Diagnosis not present

## 2023-01-25 DIAGNOSIS — H401132 Primary open-angle glaucoma, bilateral, moderate stage: Secondary | ICD-10-CM | POA: Diagnosis not present

## 2023-01-25 DIAGNOSIS — H40013 Open angle with borderline findings, low risk, bilateral: Secondary | ICD-10-CM | POA: Diagnosis not present

## 2023-01-25 DIAGNOSIS — H353211 Exudative age-related macular degeneration, right eye, with active choroidal neovascularization: Secondary | ICD-10-CM | POA: Diagnosis not present

## 2023-01-25 DIAGNOSIS — H353222 Exudative age-related macular degeneration, left eye, with inactive choroidal neovascularization: Secondary | ICD-10-CM | POA: Diagnosis not present

## 2023-01-25 DIAGNOSIS — H353113 Nonexudative age-related macular degeneration, right eye, advanced atrophic without subfoveal involvement: Secondary | ICD-10-CM | POA: Diagnosis not present

## 2023-01-25 DIAGNOSIS — H35351 Cystoid macular degeneration, right eye: Secondary | ICD-10-CM | POA: Diagnosis not present

## 2023-02-05 DIAGNOSIS — N400 Enlarged prostate without lower urinary tract symptoms: Secondary | ICD-10-CM | POA: Diagnosis not present

## 2023-02-05 DIAGNOSIS — N433 Hydrocele, unspecified: Secondary | ICD-10-CM | POA: Diagnosis not present

## 2023-02-18 ENCOUNTER — Other Ambulatory Visit: Payer: Self-pay | Admitting: Interventional Cardiology

## 2023-02-22 DIAGNOSIS — I38 Endocarditis, valve unspecified: Secondary | ICD-10-CM | POA: Insufficient documentation

## 2023-02-22 NOTE — Progress Notes (Unsigned)
Cardiology Office Note:    Date:  02/23/2023  ID:  [name], DOB [birth year], MRN [id number] PCP: Johny Blamer, MD  [facility] Providers Cardiologist:  Lance Muss, MD          Patient Profile:   Valvular heart disease (mitral regurgitation, aortic stenosis) Hx mitral valve repair in 09/2004 S/p bioprosthetic AVR and bioprosthetic MVR in 08/2013 TTE 12/27/2018: EF 55-60, normal RVSF, severe LAE, normal function of mitral valve and aortic valve prosthesis Persistent atrial fibrillation  Monitor 09/2019: multiple pauses >> saw EP (Dr. Taylor)>>Diltiazem reduced  Nonobstructive coronary artery disease LHC 07/05/2013: D1 proximal mild atherosclerosis; RCA proximal 20 Carotid Dopplers 08/12/2013: Bilateral ICA 1-39 Chronic kidney disease Hypertension Hyperlipidemia      History of Present Illness:   Jason [name] is a 89 y.o. male who returns for follow-up of valvular heart disease, A-fib.  He was last seen by Dr. Eldridge Dace 02/17/2022. He is here alone. He is doing well w/o chest pain, shortness of breath, syncope, orthopnea. He has noted lower ext edema, worse on the left. This increases throughout the day and resolves with elevation.   Review of Systems  Gastrointestinal:  Negative for hematochezia and melena.  Genitourinary:  Negative for hematuria.   See HPI    Studies Reviewed:    EKG:  atrial fibrillation, HR 65, LAFB, Right Bundle Branch Block, QTc 470 ms, no changes  Risk Assessment/Calculations:    CHA2DS2-VASc Score = 5   This indicates a 7.2% annual risk of stroke. The patient's score is based upon: CHF History: 1 HTN History: 1 Diabetes History: 0 Stroke History: 0 Vascular Disease History: 1 Age Score: 2 Gender Score: 0            Physical Exam:   VS:  BP 130/70   Pulse 65   Ht 5\' 7"  (1.702 m)   Wt 155 lb (70.3 kg)   SpO2 92%   BMI 24.28 kg/m    Wt Readings from Last 3 Encounters:  02/23/23 155 lb (70.3 kg)  02/17/22 157 lb (71.2  kg)  12/16/20 169 lb 9.6 oz (76.9 kg)    Constitutional:      Appearance: Healthy appearance. Not in distress.  Pulmonary:     Breath sounds: Normal breath sounds. No wheezing. No rales.  Cardiovascular:     Normal rate. Irregularly irregular rhythm.     Murmurs: There is a grade 2/6 systolic murmur at the URSB.  Edema:    Peripheral edema present.    Ankle: bilateral trace edema of the ankle. Abdominal:     Palpations: Abdomen is soft.       ASSESSMENT AND PLAN:   Atrial fibrillation, persistent Rate is controlled. He has preferred to remain on Warfarin for anticoagulation. He is tolerating anticoagulation. Continue f/u with Coumadin clinic as planned. Continue Diltiazem 120 mg once daily, Metoprolol succinate 12.5 mg twice daily. F/u 1 year.   Valvular heart disease S/p bioprosthetic MVR, AVR in 2014. I suspect his lower ext edema is all related to venous insufficiency. However, since it has been 4 years since his last echocardiogram, will get a f/u 2D echocardiogram. Continue SBE prophylaxis. F/u with Dr. Eldridge Dace in 1 year.   HTN (hypertension) BP is well controlled. Continue Diltiazem 120 mg once daily, Metoprolol succinate 12.5 mg twice daily, Lasix 40 mg in am and 20 mg in pm, Lisinopril 10 mg once daily. Labs from primary care reviewed via KPN. Creatinine and potassium were normal in 05/2022.  Pure hypercholesterolemia Labs from PCP reviewed via KPN. LDL optimal at 44 in 05/2022. He remains on Atorvastatin 20 mg once daily.     Dispo:  Return in about 1 year (around 02/23/2024) for Routine Follow Up w/ Dr. Eldridge Dace .  Signed, Tereso Newcomer, PA-C

## 2023-02-23 ENCOUNTER — Ambulatory Visit: Payer: Medicare Other | Attending: Physician Assistant | Admitting: Physician Assistant

## 2023-02-23 ENCOUNTER — Encounter: Payer: Self-pay | Admitting: Physician Assistant

## 2023-02-23 VITALS — BP 130/70 | HR 65 | Ht 67.0 in | Wt 155.0 lb

## 2023-02-23 DIAGNOSIS — Z952 Presence of prosthetic heart valve: Secondary | ICD-10-CM | POA: Diagnosis not present

## 2023-02-23 DIAGNOSIS — I4819 Other persistent atrial fibrillation: Secondary | ICD-10-CM | POA: Diagnosis not present

## 2023-02-23 DIAGNOSIS — E7800 Pure hypercholesterolemia, unspecified: Secondary | ICD-10-CM

## 2023-02-23 DIAGNOSIS — I10 Essential (primary) hypertension: Secondary | ICD-10-CM

## 2023-02-23 DIAGNOSIS — I38 Endocarditis, valve unspecified: Secondary | ICD-10-CM | POA: Diagnosis not present

## 2023-02-23 NOTE — Assessment & Plan Note (Signed)
Rate is controlled. He has preferred to remain on Warfarin for anticoagulation. He is tolerating anticoagulation. Continue f/u with Coumadin clinic as planned. Continue Diltiazem 120 mg once daily, Metoprolol succinate 12.5 mg twice daily. F/u 1 year.

## 2023-02-23 NOTE — Assessment & Plan Note (Signed)
Labs from PCP reviewed via KPN. LDL optimal at 44 in 05/2022. He remains on Atorvastatin 20 mg once daily.

## 2023-02-23 NOTE — Assessment & Plan Note (Signed)
S/p bioprosthetic MVR, AVR in 2014. I suspect his lower ext edema is all related to venous insufficiency. However, since it has been 4 years since his last echocardiogram, will get a f/u 2D echocardiogram. Continue SBE prophylaxis. F/u with Dr. Eldridge Dace in 1 year.

## 2023-02-23 NOTE — Patient Instructions (Signed)
Medication Instructions:  Your physician recommends that you continue on your current medications as directed. Please refer to the Current Medication list given to you today.  *If you need a refill on your cardiac medications before your next appointment, please call your pharmacy*   Lab Work: None ordered  If you have labs (blood work) drawn today and your tests are completely normal, you will receive your results only by: MyChart Message (if you have MyChart) OR A paper copy in the mail If you have any lab test that is abnormal or we need to change your treatment, we will call you to review the results.   Testing/Procedures: Your physician has requested that you have an echocardiogram. Echocardiography is a painless test that uses sound waves to create images of your heart. It provides your doctor with information about the size and shape of your heart and how well your heart's chambers and valves are working. This procedure takes approximately one hour. There are no restrictions for this procedure. Please do NOT wear cologne, perfume, aftershave, or lotions (deodorant is allowed). Please arrive 15 minutes prior to your appointment time.    Follow-Up: At [facility], you and your health needs are our priority.  As part of our continuing mission to provide you with exceptional heart care, we have created designated Provider Care Teams.  These Care Teams include your primary Cardiologist (physician) and Advanced Practice Providers (APPs -  Physician Assistants and Nurse Practitioners) who all work together to provide you with the care you need, when you need it.  We recommend signing up for the patient portal called "MyChart".  Sign up information is provided on this After Visit Summary.  MyChart is used to connect with patients for Virtual Visits (Telemedicine).  Patients are able to view lab/test results, encounter notes, upcoming appointments, etc.  Non-urgent messages can be  sent to your provider as well.   To learn more about what you can do with MyChart, go to [email or web address].    Your next appointment:   1 year(s)  Provider:   Lance Muss, MD     Other Instructions

## 2023-02-23 NOTE — Assessment & Plan Note (Signed)
BP is well controlled. Continue Diltiazem 120 mg once daily, Metoprolol succinate 12.5 mg twice daily, Lasix 40 mg in am and 20 mg in pm, Lisinopril 10 mg once daily. Labs from primary care reviewed via KPN. Creatinine and potassium were normal in 05/2022.

## 2023-02-25 ENCOUNTER — Ambulatory Visit: Payer: Medicare Other | Attending: Internal Medicine | Admitting: *Deleted

## 2023-02-25 DIAGNOSIS — I4821 Permanent atrial fibrillation: Secondary | ICD-10-CM | POA: Diagnosis not present

## 2023-02-25 DIAGNOSIS — Z952 Presence of prosthetic heart valve: Secondary | ICD-10-CM | POA: Insufficient documentation

## 2023-02-25 DIAGNOSIS — Z5181 Encounter for therapeutic drug level monitoring: Secondary | ICD-10-CM | POA: Insufficient documentation

## 2023-02-25 DIAGNOSIS — Z953 Presence of xenogenic heart valve: Secondary | ICD-10-CM | POA: Diagnosis not present

## 2023-02-25 LAB — POCT INR: INR: 2.4 (ref 2.0–3.0)

## 2023-02-25 NOTE — Patient Instructions (Signed)
Description   Continue taking Warfarin 1 tablet daily except 1/2 tablet on Sundays.  Recheck INR in 8 weeks. Coumadin Clinic [phone].      

## 2023-03-06 ENCOUNTER — Other Ambulatory Visit: Payer: Self-pay | Admitting: Interventional Cardiology

## 2023-03-09 DIAGNOSIS — H35351 Cystoid macular degeneration, right eye: Secondary | ICD-10-CM | POA: Diagnosis not present

## 2023-03-09 DIAGNOSIS — H353211 Exudative age-related macular degeneration, right eye, with active choroidal neovascularization: Secondary | ICD-10-CM | POA: Diagnosis not present

## 2023-03-09 DIAGNOSIS — H4051X0 Glaucoma secondary to other eye disorders, right eye, stage unspecified: Secondary | ICD-10-CM | POA: Diagnosis not present

## 2023-03-09 DIAGNOSIS — H401132 Primary open-angle glaucoma, bilateral, moderate stage: Secondary | ICD-10-CM | POA: Diagnosis not present

## 2023-03-09 DIAGNOSIS — H18423 Band keratopathy, bilateral: Secondary | ICD-10-CM | POA: Diagnosis not present

## 2023-03-09 DIAGNOSIS — H40013 Open angle with borderline findings, low risk, bilateral: Secondary | ICD-10-CM | POA: Diagnosis not present

## 2023-03-09 DIAGNOSIS — H353113 Nonexudative age-related macular degeneration, right eye, advanced atrophic without subfoveal involvement: Secondary | ICD-10-CM | POA: Diagnosis not present

## 2023-03-16 ENCOUNTER — Other Ambulatory Visit: Payer: Self-pay | Admitting: Interventional Cardiology

## 2023-03-26 ENCOUNTER — Ambulatory Visit (HOSPITAL_COMMUNITY): Payer: Medicare Other | Attending: Cardiology

## 2023-03-26 DIAGNOSIS — I38 Endocarditis, valve unspecified: Secondary | ICD-10-CM

## 2023-03-26 DIAGNOSIS — Z952 Presence of prosthetic heart valve: Secondary | ICD-10-CM | POA: Diagnosis not present

## 2023-03-26 DIAGNOSIS — I4819 Other persistent atrial fibrillation: Secondary | ICD-10-CM | POA: Diagnosis not present

## 2023-03-26 LAB — ECHOCARDIOGRAM COMPLETE
AR max vel: 1.1 cm2
AV Area VTI: 1.24 cm2
AV Area mean vel: 1.17 cm2
AV Mean grad: 13.8 mmHg
AV Peak grad: 26.5 mmHg
Ao pk vel: 2.58 m/s
Area-P 1/2: 1.14 cm2
MV VTI: 0.8 cm2
S' Lateral: 3 cm

## 2023-04-01 ENCOUNTER — Telehealth: Payer: Self-pay | Admitting: Physician Assistant

## 2023-04-01 NOTE — Telephone Encounter (Signed)
Pt is returning your call after receiving your message and is requesting a callback. Please advise

## 2023-04-01 NOTE — Telephone Encounter (Signed)
Call returned. Echocardiogram results discussed. See result note from echocardiogram. Tereso Newcomer, PA-C    04/01/2023 10:54 AM

## 2023-04-02 ENCOUNTER — Telehealth: Payer: Self-pay | Admitting: *Deleted

## 2023-04-02 DIAGNOSIS — I050 Rheumatic mitral stenosis: Secondary | ICD-10-CM

## 2023-04-02 NOTE — Telephone Encounter (Signed)
-----   Message from Loa Socks, LPN sent at 7/82/9562  8:30 AM EDT -----  ----- Message ----- From: Kennon Rounds Sent: 04/01/2023   8:44 AM EDT To: [address] Triage  Results sent to [name] via MyChart. See MyChart comments below. I will send a copy to PCP as FYI PLAN:  -Please refer to structural heart clinic (Dr. Excell Seltzer or Dr. Lynnette Caffey) for prosthetic mitral valve stenosis. -I left him a message to call and have sent a MyChart message. Send me a message if he wants me to call him back.  Mr. Lofgren  Your echocardiogram shows normal ejection fraction (heart function). Your mitral valve replacement has thickened and become stiff. I asked Dr. Eldridge Dace to look at this as well. We would like you to see one of our structural heart doctors to better evaluate this (Dr. Excell Seltzer or Dr. Lynnette Caffey). Our office will contact you.  Tereso Newcomer, PA-C

## 2023-04-13 DIAGNOSIS — H353211 Exudative age-related macular degeneration, right eye, with active choroidal neovascularization: Secondary | ICD-10-CM | POA: Diagnosis not present

## 2023-04-13 DIAGNOSIS — H40013 Open angle with borderline findings, low risk, bilateral: Secondary | ICD-10-CM | POA: Diagnosis not present

## 2023-04-13 DIAGNOSIS — H353113 Nonexudative age-related macular degeneration, right eye, advanced atrophic without subfoveal involvement: Secondary | ICD-10-CM | POA: Diagnosis not present

## 2023-04-13 DIAGNOSIS — H35351 Cystoid macular degeneration, right eye: Secondary | ICD-10-CM | POA: Diagnosis not present

## 2023-04-13 DIAGNOSIS — H353222 Exudative age-related macular degeneration, left eye, with inactive choroidal neovascularization: Secondary | ICD-10-CM | POA: Diagnosis not present

## 2023-04-13 DIAGNOSIS — H401132 Primary open-angle glaucoma, bilateral, moderate stage: Secondary | ICD-10-CM | POA: Diagnosis not present

## 2023-04-16 ENCOUNTER — Encounter: Payer: Self-pay | Admitting: Internal Medicine

## 2023-04-16 ENCOUNTER — Ambulatory Visit: Payer: Medicare Other | Attending: Internal Medicine | Admitting: Internal Medicine

## 2023-04-16 VITALS — BP 164/62 | HR 61 | Resp 98 | Ht 68.0 in | Wt 148.0 lb

## 2023-04-16 DIAGNOSIS — I10 Essential (primary) hypertension: Secondary | ICD-10-CM | POA: Diagnosis not present

## 2023-04-16 DIAGNOSIS — I050 Rheumatic mitral stenosis: Secondary | ICD-10-CM | POA: Diagnosis not present

## 2023-04-16 DIAGNOSIS — Z952 Presence of prosthetic heart valve: Secondary | ICD-10-CM | POA: Diagnosis not present

## 2023-04-16 DIAGNOSIS — I4819 Other persistent atrial fibrillation: Secondary | ICD-10-CM | POA: Diagnosis not present

## 2023-04-16 LAB — CBC
Hemoglobin: 12.8 g/dL — ABNORMAL LOW (ref 13.0–17.7)
Platelets: 229 10*3/uL (ref 150–450)
RDW: 13.2 % (ref 11.6–15.4)
WBC: 7.1 10*3/uL (ref 3.4–10.8)

## 2023-04-16 LAB — COMPREHENSIVE METABOLIC PANEL

## 2023-04-16 LAB — PROTIME-INR

## 2023-04-16 NOTE — Patient Instructions (Signed)
Medication Instructions:  Your physician recommends that you continue on your current medications as directed. Please refer to the Current Medication list given to you today.  *If you need a refill on your cardiac medications before your next appointment, please call your pharmacy*   Lab Work: Lab work to be done today--BNP, CBC, CMET, PT/INR If you have labs (blood work) drawn today and your tests are completely normal, you will receive your results only by: MyChart Message (if you have MyChart) OR A paper copy in the mail If you have any lab test that is abnormal or we need to change your treatment, we will call you to review the results.   Testing/Procedures: Your physician has requested that you have an exercise tolerance test. For further information please visit [email or web address] Please also follow instruction sheet, as given.  Your physician has requested that you have a cardiac catheterization. Cardiac catheterization is used to diagnose and/or treat various heart conditions. Doctors may recommend this procedure for a number of different reasons. The most common reason is to evaluate chest pain. Chest pain can be a symptom of coronary artery disease (CAD), and cardiac catheterization can show whether plaque is narrowing or blocking your heart's arteries. This procedure is also used to evaluate the valves, as well as measure the blood flow and oxygen levels in different parts of your heart. For further information please visit [email or web address] Please follow instruction sheet, as given. Scheduled for July 29   Follow-Up: At [facility], you and your health needs are our priority.  As part of our continuing mission to provide you with exceptional heart care, we have created designated Provider Care Teams.  These Care Teams include your primary Cardiologist (physician) and Advanced Practice Providers (APPs -  Physician Assistants and Nurse Practitioners) who all work  together to provide you with the care you need, when you need it.  We recommend signing up for the patient portal called "MyChart".  Sign up information is provided on this After Visit Summary.  MyChart is used to connect with patients for Virtual Visits (Telemedicine).  Patients are able to view lab/test results, encounter notes, upcoming appointments, etc.  Non-urgent messages can be sent to your provider as well.   To learn more about what you can do with MyChart, go to [email or web address].    Your next appointment:   To be arranged after procedure  Provider:   Alverda Skeans, MD    Other Instructions  [name] [address] [place], [place] [address] [id number] [facility] [name] [address] Dept: [phone] Loc: [phone]  Jason [name]  04/16/2023  You are scheduled for a Cardiac Catheterization on Monday, July 29 with Dr. Lance Muss.  1. Please arrive at the [facility] (Main Entrance A) at [facility]: [address] [place], [place] [address] at 7:00 AM (This time is 2 hour(s) before your procedure to ensure your preparation). Free valet parking service is available. You will check in at ADMITTING. The support person will be asked to wait in the waiting room.  It is OK to have someone drop you off and come back when you are ready to be discharged.    Special note: Every effort is made to have your procedure done on time. Please understand that emergencies sometimes delay scheduled procedures.  2. Diet: Do not eat solid foods after midnight.  The patient may have clear liquids until 5am  upon the day of the procedure.  3. Labs: done in office on 7/5.  4. Medication instructions in preparation for your procedure:   Contrast Allergy: No    Stop taking Coumadin (Warfarin) on Tuesday, July 23.   Do not take furosemide the morning of the procedure  On the morning of your  procedure, take Aspirin 81 mg and any morning medicines NOT listed above.  You may use sips of water.  5. Plan to go home the same day, you will only stay overnight if medically necessary. 6. Bring a current list of your medications and current insurance cards. 7. You MUST have a responsible person to drive you home. 8. Someone MUST be with you the first 24 hours after you arrive home or your discharge will be delayed. 9. Please wear clothes that are easy to get on and off and wear slip-on shoes.  Thank you for allowing [place] to care for you!   -- [name]

## 2023-04-16 NOTE — Progress Notes (Signed)
Patient ID: Jason [name] MRN: [id number] DOB/AGE: 1932/09/20 89 y.o.  Primary Care Physician:[name], Tawni Pummel, MD Primary Cardiologist: Everette Rank, MD   FOCUSED CARDIOVASCULAR PROBLEM LIST:   1.  History of minimally invasive mitral valve repair 2005  2.  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  Atrial fibrillation on Coumadin 4.  Hypertension 5.  Hyperlipidemia 6.  Mild obstructive coronary artery disease on preoperative cardiac catheterization 2014 7.  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 male with the indicated medical history here for recommendations regarding bioprosthetic structural valve degeneration of his mitral bioprosthesis.  On review of the records the patient had undergone concomitant aortic and mitral valve replacements in 2014 due to aortic stenosis and severe degenerative mitral valve regurgitation with a Barlow's type valve (this was after the patient had undergone a minimally invasive mitral valve repair about 9 years prior at [facility]).  The patient was to have undergone maze at the same time but due to dense adhesions this was not performed.  Coronary angiography prior to the procedure demonstrated no significant obstructive disease.  He last saw Dr. Eldridge Dace in May 2023 and was doing well without any issues with bleeding while on Coumadin.  He was seen by Tereso Newcomer a few months ago.  Peripheral edema was noted.  It has been sometime since he had an echocardiogram so one was ordered which demonstrated moderate RV dysfunction and severe bioprosthetic degeneration of the patient's mitral bioprosthesis with  good aortic valve function.    The patient is here with his family.  He is doing very well.  He denies any symptoms of right-sided heart failure including early satiety, abdominal distention, or severe peripheral edema.  He does have unilateral left lower extremity dependent edema which resolves with recumbency.  He denies any shortness of breath, chest pain, presyncope, or syncope.  He has been tolerating Coumadin well without any serious bleeding episodes.  He is able to walk every day and do his activities of daily living without any issues.  He lives by himself.  He sees a Education officer, community and has cleanings every 6 months.   Past Medical History:  Diagnosis Date   AK (actinic keratosis)    Atrial fibrillation, persistent (HCC)    chronic coumadin therapy   Bifascicular block    Blindness    left eye   CKD (chronic kidney disease), stage III (HCC)    Coronary artery disease    20% RCA by 2014 cath   Elevated PSA    GERD (gastroesophageal reflux disease)    H/O hiatal hernia    Heart murmur    History of kidney stones    Hydrocele    left   Hyperlipidemia    Hypertension    Internal hemorrhoids    Kidney stones    kidney stones   Macular degeneration    right eye   Mitral regurgitation 02/25/2011   Recurrent MR s/p mitral valve repair then bioprosthetic MVR 2014   OA (osteoarthritis) of neck    S/P aortic valve replacement with bioprosthetic valve 08/15/2013   23mm Edwards Magna Ease bovine pericardial tissue valve   S/P mitral  valve repair 09/15/2004   Complex valvuloplasty including artificial Goretex neocord placement x4 and 36mm SARP ring annuloplasty via right mini thoracotomy approach - Dr Silvestre Mesi @ [facility]   S/P redo mitral valve replacement and aortic valve replacement with bioprosthetic valves 08/15/2013   27mm Edwards [facility] Mitral bovine pericardial tissue valve   Severe aortic stenosis 11/14   tissue AVR    Vocal cord paralysis     Past Surgical History:  Procedure Laterality  Date   AORTIC VALVE REPLACEMENT N/A 08/15/2013   Procedure: AORTIC VALVE REPLACEMENT (AVR);  Surgeon: Purcell Nails, MD;  Location: [facility] OR;  Service: Open Heart Surgery;  Laterality: N/A;   CATARACT EXTRACTION Bilateral 2011   Dr. Hazle Quant   ESOPHAGOGASTRODUODENOSCOPY N/A 08/02/2013   Procedure: ESOPHAGOGASTRODUODENOSCOPY (EGD);  Surgeon: Everette Rank, MD;  Location: [facility] ENDOSCOPY;  Service: Cardiovascular;  Laterality: N/A;   EYE SURGERY Right    infection   GROIN MASS OPEN BIOPSY  2012   HEMORRHOID SURGERY     pt denies.   INGUINAL HERNIA REPAIR  08/2011   INTRAOPERATIVE TRANSESOPHAGEAL ECHOCARDIOGRAM N/A 08/15/2013   Procedure: INTRAOPERATIVE TRANSESOPHAGEAL ECHOCARDIOGRAM;  Surgeon: Purcell Nails, MD;  Location: [facility] OR;  Service: Open Heart Surgery;  Laterality: N/A;   LEFT AND RIGHT HEART CATHETERIZATION WITH CORONARY ANGIOGRAM N/A 07/05/2013   Procedure: LEFT AND RIGHT HEART CATHETERIZATION WITH CORONARY ANGIOGRAM;  Surgeon: Corky Crafts, MD;  Location: [facility] CATH LAB;  Service: Cardiovascular;  Laterality: N/A;   MASS EXCISION Right 03/30/2018   Procedure: EXCISION RIGHT FLANK  MASS;  Surgeon: Abigail Miyamoto, MD;  Location: [facility] OR;  Service: General;  Laterality: Right;   MITRAL VALVE REPAIR  09/16/2011   @ [facility], Dr Silvestre Mesi   MITRAL VALVE REPAIR N/A 08/15/2013   Procedure: REDO MITRAL VALVE (MV) REPLACEMENT;  Surgeon: Purcell Nails, MD;  Location: [facility] OR;  Service: Open Heart Surgery;  Laterality: N/A;   TEE WITHOUT CARDIOVERSION N/A 07/05/2013   Procedure: TRANSESOPHAGEAL ECHOCARDIOGRAM (TEE);  Surgeon: Everette Rank, MD;  Location: [facility] ENDOSCOPY;  Service: Cardiovascular;  Laterality: N/A;   TEE WITHOUT CARDIOVERSION N/A 08/02/2013   Procedure: TRANSESOPHAGEAL ECHOCARDIOGRAM (TEE);  Surgeon: Everette Rank, MD;  Location: [facility] ENDOSCOPY;  Service: Cardiovascular;  Laterality: N/A;   UMBILICAL HERNIA REPAIR N/A 03/30/2018   Procedure: HERNIA REPAIR UMBILICAL;  Surgeon: Abigail Miyamoto,  MD;  Location: [facility] OR;  Service: General;  Laterality: N/A;    Family History  Problem Relation Age of Onset   Cancer Father        colon   Hypertension Father    Diabetes Father    Hyperlipidemia Father    Stroke Father    Hypertension Mother    Diabetes Mother    Hyperlipidemia Mother    Stroke Mother    Heart attack Mother    Hypertension Brother        MI, S/P MVR    Social History   Socioeconomic History   Marital status: Married    Spouse name: Not on file   Number of children: 0   Years of education: Not on file   Highest education level: Not on file  Occupational History   Occupation: retired  Tobacco Use   Smoking status: Former    Years: 8    Types: Cigarettes    Quit date: 10/12/1978    Years since quitting: 44.5   Smokeless tobacco: Never  Vaping Use   Vaping Use: Never used  Substance and Sexual Activity   Alcohol  use: No   Drug use: No   Sexual activity: Not on file  Other Topics Concern   Not on file  Social History Narrative   Not on file   Social Determinants of Health   Financial Resource Strain: Not on file  Food Insecurity: Not on file  Transportation Needs: Not on file  Physical Activity: Not on file  Stress: Not on file  Social Connections: Not on file  Intimate Partner Violence: Not on file     Prior to Admission medications   Medication Sig Start Date End Date Taking? Authorizing Provider  acetaminophen (TYLENOL) 650 MG CR tablet Take 650 mg by mouth 2 (two) times daily.     [provider]  Ascorbic Acid (VITAMIN C) 1000 MG tablet Take 1,000 mg by mouth daily.    [provider]  atorvastatin (LIPITOR) 20 MG tablet Take 20 mg by mouth at bedtime.  06/05/11   [provider]  b complex vitamins tablet Take 1 tablet by mouth daily.    [provider]  brimonidine (ALPHAGAN) 0.2 % ophthalmic solution Place 1 drop into both eyes 2 (two) times daily. 02/11/23   [provider]  diltiazem  (CARDIZEM CD) 120 MG 24 hr capsule TAKE 1 CAPSULE BY MOUTH EVERY DAY 03/09/23   Corky Crafts, MD  furosemide (LASIX) 40 MG tablet 40 mg in the am and 20 mg in the pm 06/19/14   Corky Crafts, MD  lisinopril (ZESTRIL) 10 MG tablet Take 10 mg by mouth daily. 08/14/19   [provider]  metoprolol succinate (TOPROL-XL) 25 MG 24 hr tablet TAKE 1/2 TABLETS BY MOUTH IN THE MORNING AND AT BEDTIME. 03/16/23   Corky Crafts, MD  Multiple Vitamins-Minerals (PRESERVISION AREDS PO) Take 1 tablet by mouth 2 (two) times daily.     [provider]  warfarin (COUMADIN) 5 MG tablet TAKE AS DIRECTED BY COUMADIN CLINIC 07/15/16   Corky Crafts, MD    Allergies  Allergen Reactions   Penicillins Other (See Comments) and Rash    Has patient had a PCN reaction causing immediate rash, facial/tongue/throat swelling, SOB or lightheadedness with hypotension: # # Yes # #  Has patient had a PCN reaction causing severe rash involving mucus membranes or skin necrosis: No  Has patient had a PCN reaction that required hospitalization: No  Has patient had a PCN reaction occurring within the last 10 years: No  If all of the above answers are "NO", then may proceed with Cephalosporin use.  Other Reaction(s): Other (See Comments), Unknown  Has patient had a PCN reaction causing immediate rash, facial/tongue/throat swelling, SOB or lightheadedness with hypotension: # # Yes # # Has patient had a PCN reaction causing severe rash involving mucus membranes or skin necrosis: No Has patient had a PCN reaction that required hospitalization: No Has patient had a PCN reaction occurring within the last 10 years: No If all of the above answers are "NO", then may proceed with Cephalosporin use.    REVIEW OF SYSTEMS:  General: no fevers/chills/night sweats Eyes: no blurry vision, diplopia, or amaurosis ENT: no sore throat or hearing loss Resp: no cough, wheezing, or hemoptysis CV: no edema or  palpitations GI: no abdominal pain, nausea, vomiting, diarrhea, or constipation GU: no dysuria, frequency, or hematuria Skin: no rash Neuro: no headache, numbness, tingling, or weakness of extremities Musculoskeletal: no joint pain or swelling Heme: no bleeding, DVT, or easy bruising Endo: no polydipsia or polyuria  BP Marland Kitchen)  164/62   Pulse 61   Resp (!) 98   Ht 5\' 8"  (1.727 m)   Wt 148 lb (67.1 kg)   BMI 22.50 kg/m   PHYSICAL EXAM: GEN:  AO x 3 in no acute distress HEENT: normal Dentition: Normal Neck: JVP normal. +2 carotid upstrokes without bruits. No thyromegaly. Lungs: equal expansion, clear bilaterally CV: Apex is discrete and nondisplaced, irregular RR with 2/6 SEM Abd: soft, non-tender, non-distended; no bruit; positive bowel sounds Ext: no edema, ecchymoses, or cyanosis Vascular: 2+ femoral pulses, 2+ radial pulses       Skin: warm and dry without rash Neuro: CN II-XII grossly intact; motor and sensory grossly intact    DATA AND STUDIES:  EKG: Atrial fibrillation with a right bundle branch block and left anterior fascicular block  2D ECHO: June 2024   1. Left ventricular ejection fraction, by estimation, is 55 to 60%. The left ventricle has normal function. The left ventricle has no regional wall motion abnormalities. Left ventricular diastolic parameters are indeterminate.  2. Right ventricular systolic function is moderately reduced. The right ventricular size is moderately enlarged. There is moderately elevated pulmonary artery systolic pressure. The estimated right ventricular systolic pressure is 54.0 mmHg.  3. Left atrial size was severely dilated.  4. Right atrial size was severely dilated.  5. The MV bioprosthesis has thickened leaflets. Gradients and calcualted MVA c/w moderate to severe prosthetic MV stenosis. mean MVG . MVA 1.1 cm2. The mitral valve has been repaired/replaced. No evidence of mitral valve regurgitation. Moderate to  severe  mitral stenosis. The mean mitral valve gradient is 10.7 mmHg.  6. Tricuspid valve regurgitation is mild to moderate.  7. The aortic valve has been repaired/replaced. There is mild calcification of the aortic valve. Aortic valve regurgitation is not visualized. No aortic stenosis is present. Echo findings are consistent with normal structure and function of the aortic valve prosthesis. Aortic valve area, by VTI measures 1.24 cm. Aortic valve mean gradient measures 13.8 mmHg. Aortic valve Vmax measures 2.58 m/s.  8. Aortic dilatation noted. There is mild dilatation of the aortic root, measuring 41 mm.  9. The inferior vena cava is dilated in size with <50% respiratory variability, suggesting right atrial pressure of 15 mmHg.    CARDIAC CATH: n/a  STS RISK CALCULATOR: pending  NHYA CLASS: 1    ASSESSMENT AND PLAN:   Mitral valve stenosis, unspecified etiology - Plan: EKG 12-Lead, Pro b natriuretic peptide, CBC, Comp Met (CMET), Protime-INR, EXERCISE TOLERANCE TEST (ETT)  S/P MVR (mitral valve replacement) - Plan: Pro b natriuretic peptide, CBC, Comp Met (CMET), Protime-INR, EXERCISE TOLERANCE TEST (ETT)  S/P AVR (aortic valve replacement) - Plan: Pro b natriuretic peptide, CBC, Comp Met (CMET), Protime-INR, EXERCISE TOLERANCE TEST (ETT)  Atrial fibrillation, persistent (HCC) - Plan: Pro b natriuretic peptide, CBC, Comp Met (CMET), Protime-INR, EXERCISE TOLERANCE TEST (ETT)  Primary hypertension - Plan: Pro b natriuretic peptide, CBC, Comp Met (CMET), Protime-INR, EXERCISE TOLERANCE TEST (ETT)  I reviewed the patient's echocardiogram done in 2020 which demonstrated mild RV dilatation with relatively preserved function.  The patient's echocardiogram most recently demonstrates a TAPSE of 1.6, moderate bioprosthetic aortic valve degeneration (mean gradient ), severe mitral bioprosthetic valve degeneration, and biatrial enlargement with a left atrium being very very large.  The  patient is doing very well.  She denies any symptoms that I can attribute to his mitral disorder including right-sided heart failure symptoms or significant shortness of breath.  I am concerned about increasing  RV dimensions and a TAPSE that is mildly diminished.  His estimated RV systolic pressure on the echocardiogram was also 54 mmHg.  I had a long conversation with the patient about his situation.  I think we need more information.  I compared the echocardiogram from 2020 to the one from 2024 and the right ventricular dimensions are very similar.  Certainly this measurement is subject to air.  The development of RV dysfunction and elevated pulmonary pressures is however concerning.  I think we need more information.  I will refer the patient for coronary angiography and right heart catheterization with the latter being what I am most interested in, particularly his PA pressures and pulmonary artery pulsatility index.  I will also refer him for an exercise treadmill stress test to assess his exercise capacity.  I suspect he will do fairly well on this as I do not see any bowing of the ventricular septum.  Certainly if his PA pressures are elevated consistent with pulmonary venous hypertension we will have to make a decision about whether transcatheter mitral valve replacement should be pursued and when.  mL reluctant to pursue an intervention when the patient feels relatively well.  Certainly decreasing RV function or development of pulmonary hypertension would be an indication for intervention.  After discussing this with the patient we decided to obtain at CT scan as well.  On my review of the echocardiogram it looks like the valve is canted towards the left ventricular outflow tract owing to the extremely wide and large left atrium.  Typically mitral valve in valve procedures after a failed bioprosthetic valve are not at high risk for left ventricular outflow tract obstruction.  Once all data is available  for review further recommendations will be issued.  We will check laboratories in preparation for his catheterization procedure and I will check a BNP as well.   I have personally reviewed the patients imaging data as summarized above.   Total time spent with patient today 60 minutes. This includes reviewing records, evaluating the patient and coordinating care.   Orbie Pyo, MD  04/16/2023 12:16 PM    [facility] [address] [place], [place], [place]  [address] Phone: [phone]; Fax: [phone]

## 2023-04-16 NOTE — Progress Notes (Signed)
Pre Surgical Assessment: 5 M Walk Test  31M=16.54ft  5 Meter Walk Test- trial 1: 4.45 seconds 5 Meter Walk Test- trial 2: 4.48 seconds 5 Meter Walk Test- trial 3: 4.52 seconds 5 Meter Walk Test Average: 4.48 seconds

## 2023-04-17 LAB — PRO B NATRIURETIC PEPTIDE: NT-Pro BNP: 2768 pg/mL — ABNORMAL HIGH (ref 0–486)

## 2023-04-17 LAB — CBC
Hematocrit: 39.1 % (ref 37.5–51.0)
MCH: 28.6 pg (ref 26.6–33.0)
MCHC: 32.7 g/dL (ref 31.5–35.7)
MCV: 87 fL (ref 79–97)
RBC: 4.48 x10E6/uL (ref 4.14–5.80)

## 2023-04-17 LAB — COMPREHENSIVE METABOLIC PANEL
ALT: 15 IU/L (ref 0–44)
AST: 23 IU/L (ref 0–40)
Alkaline Phosphatase: 166 IU/L — ABNORMAL HIGH (ref 44–121)
Bilirubin Total: 0.5 mg/dL (ref 0.0–1.2)
CO2: 25 mmol/L (ref 20–29)
Calcium: 9.8 mg/dL (ref 8.6–10.2)
Chloride: 100 mmol/L (ref 96–106)
Creatinine, Ser: 1.23 mg/dL (ref 0.76–1.27)
Potassium: 4.2 mmol/L (ref 3.5–5.2)
Sodium: 140 mmol/L (ref 134–144)
eGFR: 56 mL/min/{1.73_m2} — ABNORMAL LOW (ref >59)

## 2023-04-17 LAB — PROTIME-INR: INR: 1.9 — ABNORMAL HIGH (ref 0.9–1.2)

## 2023-04-22 ENCOUNTER — Ambulatory Visit: Payer: Medicare Other | Attending: Internal Medicine

## 2023-04-22 DIAGNOSIS — Z953 Presence of xenogenic heart valve: Secondary | ICD-10-CM | POA: Insufficient documentation

## 2023-04-22 DIAGNOSIS — Z5181 Encounter for therapeutic drug level monitoring: Secondary | ICD-10-CM | POA: Insufficient documentation

## 2023-04-22 DIAGNOSIS — I4821 Permanent atrial fibrillation: Secondary | ICD-10-CM | POA: Diagnosis not present

## 2023-04-22 DIAGNOSIS — Z952 Presence of prosthetic heart valve: Secondary | ICD-10-CM

## 2023-04-22 LAB — POCT INR: INR: 1.8 — AB (ref 2.0–3.0)

## 2023-04-22 NOTE — Patient Instructions (Signed)
Description   Take 1.5 tablets today, then resume same dosage of Warfarin 1 tablet daily except 1/2 tablet on Sundays. Per MD instruction hold Warfarin 5 days prior to procedure on 05/10/23, then resume your previous Warfarin dosage after your procedure on 05/10/23 Recheck INR in 1 week after your procedure. Coumadin Clinic [phone].

## 2023-04-23 ENCOUNTER — Ambulatory Visit: Payer: Medicare Other | Attending: Internal Medicine | Admitting: Internal Medicine

## 2023-04-23 ENCOUNTER — Encounter: Payer: Self-pay | Admitting: Internal Medicine

## 2023-04-23 VITALS — BP 112/62 | HR 71 | Ht 68.0 in | Wt 150.0 lb

## 2023-04-23 DIAGNOSIS — R911 Solitary pulmonary nodule: Secondary | ICD-10-CM | POA: Diagnosis not present

## 2023-04-23 DIAGNOSIS — Z952 Presence of prosthetic heart valve: Secondary | ICD-10-CM | POA: Insufficient documentation

## 2023-04-23 DIAGNOSIS — I050 Rheumatic mitral stenosis: Secondary | ICD-10-CM | POA: Insufficient documentation

## 2023-04-23 DIAGNOSIS — I4821 Permanent atrial fibrillation: Secondary | ICD-10-CM | POA: Diagnosis not present

## 2023-04-23 NOTE — Progress Notes (Signed)
Patient ID: Jason [name] MRN: [id number] DOB/AGE: 15-Oct-1931 89 y.o.  Primary Care Physician:[name], Tawni Pummel, MD Primary Cardiologist: Everette Rank, MD  FOCUSED CARDIOVASCULAR PROBLEM LIST:   1.  History of minimally invasive mitral valve repair 2005  2.  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  Atrial fibrillation on Coumadin 4.  Hypertension 5.  Hyperlipidemia 6.  Mild obstructive coronary artery disease on preoperative cardiac catheterization 2014 7.  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 male with the indicated medical history here for recommendations regarding bioprosthetic structural valve degeneration of his mitral bioprosthesis.  On review of the records the patient had undergone concomitant aortic and mitral valve replacements in 2014 due to aortic stenosis and severe degenerative mitral valve regurgitation with a Barlow's type valve (this was after the patient had undergone a minimally invasive mitral valve repair about 9 years prior at [facility]).  The patient was to have undergone maze at the same time but due to dense adhesions this was not performed.  Coronary angiography prior to the procedure demonstrated no significant obstructive disease.  He last saw Dr. Eldridge Dace in May 2023 and was doing well without any issues with bleeding while on Coumadin.  He was seen by Tereso Newcomer a few months ago.  Peripheral edema was noted.  It has been sometime since he had an echocardiogram so one was ordered which demonstrated moderate RV dysfunction and severe bioprosthetic degeneration of the patient's mitral  bioprosthesis with good aortic valve function.    The patient is here with his family.  He is doing very well.  He denies any symptoms of right-sided heart failure including early satiety, abdominal distention, or severe peripheral edema.  He does have unilateral left lower extremity dependent edema which resolves with recumbency.  He denies any shortness of breath, chest pain, presyncope, or syncope.  He has been tolerating Coumadin well without any serious bleeding episodes.  He is able to walk every day and do his activities of daily living without any issues.  He lives by himself.  He sees a Education officer, community and has cleanings every 6 months.  Plan: Review echocardiographic data with imaging core, check BNP, check exercise treadmill test, refer for right and left heart catheterization.  Today: In the interim I was able to review the patient's serial echocardiograms with her imaging core which showed no significant change in RV function over last several years.  Given his advanced age and lack of symptoms consensus opinion was to monitor for now.  The patient is here to discuss further.  The patient continues to do well without any evidence of right sided heart failure, dyspnea, angina, presyncope, or syncope.  He is able to do everything he needs to do in a day without any limitations.   Past Medical History:  Diagnosis Date   AK (actinic keratosis)    Atrial fibrillation, persistent (HCC)    chronic coumadin therapy   Bifascicular block    Blindness    left eye   CKD (chronic kidney disease), stage III (HCC)    Coronary artery disease    20% RCA by 2014 cath  Elevated PSA    GERD (gastroesophageal reflux disease)    H/O hiatal hernia    Heart murmur    History of kidney stones    Hydrocele    left   Hyperlipidemia    Hypertension    Internal hemorrhoids    Kidney stones    kidney stones   Macular degeneration    right eye   Mitral regurgitation 02/25/2011   Recurrent MR s/p mitral  valve repair then bioprosthetic MVR 2014   OA (osteoarthritis) of neck    S/P aortic valve replacement with bioprosthetic valve 08/15/2013   23mm Edwards Magna Ease bovine pericardial tissue valve   S/P mitral valve repair 09/15/2004   Complex valvuloplasty including artificial Goretex neocord placement x4 and 36mm SARP ring annuloplasty via right mini thoracotomy approach - Dr Silvestre Mesi @ [facility]   S/P redo mitral valve replacement and aortic valve replacement with bioprosthetic valves 08/15/2013   27mm Edwards Magna Mitral bovine pericardial tissue valve   Severe aortic stenosis 11/14   tissue AVR    Vocal cord paralysis     Past Surgical History:  Procedure Laterality Date   AORTIC VALVE REPLACEMENT N/A 08/15/2013   Procedure: AORTIC VALVE REPLACEMENT (AVR);  Surgeon: Purcell Nails, MD;  Location: [facility] OR;  Service: Open Heart Surgery;  Laterality: N/A;   CATARACT EXTRACTION Bilateral 2011   Dr. Hazle Quant   ESOPHAGOGASTRODUODENOSCOPY N/A 08/02/2013   Procedure: ESOPHAGOGASTRODUODENOSCOPY (EGD);  Surgeon: Everette Rank, MD;  Location: [facility] ENDOSCOPY;  Service: Cardiovascular;  Laterality: N/A;   EYE SURGERY Right    infection   GROIN MASS OPEN BIOPSY  2012   HEMORRHOID SURGERY     pt denies.   INGUINAL HERNIA REPAIR  08/2011   INTRAOPERATIVE TRANSESOPHAGEAL ECHOCARDIOGRAM N/A 08/15/2013   Procedure: INTRAOPERATIVE TRANSESOPHAGEAL ECHOCARDIOGRAM;  Surgeon: Purcell Nails, MD;  Location: [facility] OR;  Service: Open Heart Surgery;  Laterality: N/A;   LEFT AND RIGHT HEART CATHETERIZATION WITH CORONARY ANGIOGRAM N/A 07/05/2013   Procedure: LEFT AND RIGHT HEART CATHETERIZATION WITH CORONARY ANGIOGRAM;  Surgeon: Corky Crafts, MD;  Location: [facility] CATH LAB;  Service: Cardiovascular;  Laterality: N/A;   MASS EXCISION Right 03/30/2018   Procedure: EXCISION RIGHT FLANK  MASS;  Surgeon: Abigail Miyamoto, MD;  Location: [facility] OR;  Service: General;  Laterality: Right;   MITRAL VALVE REPAIR  09/16/2011   @ [facility], Dr  Silvestre Mesi   MITRAL VALVE REPAIR N/A 08/15/2013   Procedure: REDO MITRAL VALVE (MV) REPLACEMENT;  Surgeon: Purcell Nails, MD;  Location: [facility] OR;  Service: Open Heart Surgery;  Laterality: N/A;   TEE WITHOUT CARDIOVERSION N/A 07/05/2013   Procedure: TRANSESOPHAGEAL ECHOCARDIOGRAM (TEE);  Surgeon: Everette Rank, MD;  Location: [facility] ENDOSCOPY;  Service: Cardiovascular;  Laterality: N/A;   TEE WITHOUT CARDIOVERSION N/A 08/02/2013   Procedure: TRANSESOPHAGEAL ECHOCARDIOGRAM (TEE);  Surgeon: Everette Rank, MD;  Location: [facility] ENDOSCOPY;  Service: Cardiovascular;  Laterality: N/A;   UMBILICAL HERNIA REPAIR N/A 03/30/2018   Procedure: HERNIA REPAIR UMBILICAL;  Surgeon: Abigail Miyamoto, MD;  Location: [facility] OR;  Service: General;  Laterality: N/A;    Family History  Problem Relation Age of Onset   Cancer Father        colon   Hypertension Father    Diabetes Father    Hyperlipidemia Father    Stroke Father    Hypertension Mother    Diabetes Mother    Hyperlipidemia Mother    Stroke Mother    Heart attack Mother    Hypertension Brother  MI, S/P MVR    Social History   Socioeconomic History   Marital status: Married    Spouse name: Not on file   Number of children: 0   Years of education: Not on file   Highest education level: Not on file  Occupational History   Occupation: retired  Tobacco Use   Smoking status: Former    Current packs/day: 0.00    Types: Cigarettes    Start date: 10/12/1970    Quit date: 10/12/1978    Years since quitting: 44.5   Smokeless tobacco: Never  Vaping Use   Vaping status: Never Used  Substance and Sexual Activity   Alcohol use: No   Drug use: No   Sexual activity: Not on file  Other Topics Concern   Not on file  Social History Narrative   Not on file   Social Determinants of Health   Financial Resource Strain: Not on file  Food Insecurity: Not on file  Transportation Needs: Not on file  Physical Activity: Not on file  Stress: Not on file  Social  Connections: Not on file  Intimate Partner Violence: Not on file     Prior to Admission medications   Medication Sig Start Date End Date Taking? Authorizing Provider  acetaminophen (TYLENOL) 650 MG CR tablet Take 650 mg by mouth 2 (two) times daily.     [provider]  Ascorbic Acid (VITAMIN C) 1000 MG tablet Take 1,000 mg by mouth daily.    [provider]  atorvastatin (LIPITOR) 20 MG tablet Take 20 mg by mouth at bedtime.  06/05/11   [provider]  b complex vitamins tablet Take 1 tablet by mouth daily.    [provider]  brimonidine (ALPHAGAN) 0.2 % ophthalmic solution Place 1 drop into both eyes 2 (two) times daily. 02/11/23   [provider]  diltiazem (CARDIZEM CD) 120 MG 24 hr capsule TAKE 1 CAPSULE BY MOUTH EVERY DAY 03/09/23   Corky Crafts, MD  furosemide (LASIX) 40 MG tablet 40 mg in the am and 20 mg in the pm 06/19/14   Corky Crafts, MD  lisinopril (ZESTRIL) 10 MG tablet Take 10 mg by mouth daily. 08/14/19   [provider]  metoprolol succinate (TOPROL-XL) 25 MG 24 hr tablet TAKE 1/2 TABLETS BY MOUTH IN THE MORNING AND AT BEDTIME. 03/16/23   Corky Crafts, MD  Multiple Vitamins-Minerals (PRESERVISION AREDS PO) Take 1 tablet by mouth 2 (two) times daily.     [provider]  warfarin (COUMADIN) 5 MG tablet TAKE AS DIRECTED BY COUMADIN CLINIC 07/15/16   Corky Crafts, MD    Allergies  Allergen Reactions   Penicillins Other (See Comments) and Rash    Has patient had a PCN reaction causing immediate rash, facial/tongue/throat swelling, SOB or lightheadedness with hypotension: # # Yes # #  Has patient had a PCN reaction causing severe rash involving mucus membranes or skin necrosis: No  Has patient had a PCN reaction that required hospitalization: No  Has patient had a PCN reaction occurring within the last 10 years: No  If all of the above answers are "NO", then may proceed with Cephalosporin  use.  Other Reaction(s): Other (See Comments), Unknown  Has patient had a PCN reaction causing immediate rash, facial/tongue/throat swelling, SOB or lightheadedness with hypotension: # # Yes # # Has patient had a PCN reaction causing severe rash involving mucus membranes or skin necrosis: No Has patient had a PCN reaction  that required hospitalization: No Has patient had a PCN reaction occurring within the last 10 years: No If all of the above answers are "NO", then may proceed with Cephalosporin use.    REVIEW OF SYSTEMS:  General: no fevers/chills/night sweats Eyes: no blurry vision, diplopia, or amaurosis ENT: no sore throat or hearing loss Resp: no cough, wheezing, or hemoptysis CV: no edema or palpitations GI: no abdominal pain, nausea, vomiting, diarrhea, or constipation GU: no dysuria, frequency, or hematuria Skin: no rash Neuro: no headache, numbness, tingling, or weakness of extremities Musculoskeletal: no joint pain or swelling Heme: no bleeding, DVT, or easy bruising Endo: no polydipsia or polyuria  BP 112/62   Pulse 71   Ht 5\' 8"  (1.727 m)   Wt 150 lb (68 kg)   SpO2 96%   BMI 22.81 kg/m   PHYSICAL EXAM: GEN:  AO x 3 in no acute distress HEENT: normal Dentition: Normal Neck: JVP normal. +2 carotid upstrokes without bruits. No thyromegaly. Lungs: equal expansion, clear bilaterally CV: Apex is discrete and nondisplaced, irregular RR with 2/6 SEM Abd: soft, non-tender, non-distended; no bruit; positive bowel sounds Ext: no edema, ecchymoses, or cyanosis Vascular: 2+ femoral pulses, 2+ radial pulses       Skin: warm and dry without rash Neuro: CN II-XII grossly intact; motor and sensory grossly intact    DATA AND STUDIES:  EKG: Atrial fibrillation with a right bundle branch block and left anterior fascicular block  2D ECHO: June 2024   1. Left ventricular ejection fraction, by estimation, is 55 to 60%. The left ventricle has normal function. The left  ventricle has no regional wall motion abnormalities. Left ventricular diastolic parameters are indeterminate.  2. Right ventricular systolic function is moderately reduced. The right ventricular size is moderately enlarged. There is moderately elevated pulmonary artery systolic pressure. The estimated right ventricular systolic pressure is 54.0 mmHg.  3. Left atrial size was severely dilated.  4. Right atrial size was severely dilated.  5. The MV bioprosthesis has thickened leaflets. Gradients and calcualted MVA c/w moderate to severe prosthetic MV stenosis. mean MVG . MVA 1.1 cm2. The mitral valve has been repaired/replaced. No evidence of mitral valve regurgitation. Moderate to  severe mitral stenosis. The mean mitral valve gradient is 10.7 mmHg.  6. Tricuspid valve regurgitation is mild to moderate.  7. The aortic valve has been repaired/replaced. There is mild calcification of the aortic valve. Aortic valve regurgitation is not visualized. No aortic stenosis is present. Echo findings are consistent with normal structure and function of the aortic valve prosthesis. Aortic valve area, by VTI measures 1.24 cm. Aortic valve mean gradient measures 13.8 mmHg. Aortic valve Vmax measures 2.58 m/s.  8. Aortic dilatation noted. There is mild dilatation of the aortic root, measuring 41 mm.  9. The inferior vena cava is dilated in size with <50% respiratory variability, suggesting right atrial pressure of 15 mmHg.    CARDIAC CATH: n/a  STS RISK CALCULATOR: pending  NHYA CLASS: 1    ASSESSMENT AND PLAN:   Mitral valve stenosis, unspecified etiology  S/P MVR (mitral valve replacement)  S/P AVR (aortic valve replacement)  Atrial fibrillation [I48.91]  Pulmonary nodule  The patient is doing very well.  After the long discussion and weighing the patient's advanced age and lack of symptoms I think the best thing to do is to monitor him for intolerable symptoms or signs of  right heart failure.  I will see him back in 9 months to discuss further.  We will cancel his exercise treadmill stress test and heart catheterization procedure.  The patient agrees with this approach.  I reviewed his prior imaging studies which demonstrated nodules in the lungs on CXR and CT.  I did discuss this with the patient.  We will obtain a noncontrast CT of the chest to evaluate further.    Orbie Pyo, MD  04/23/2023 3:46 PM    [facility] [address] [place], [place], [place]  [address] Phone: [phone]; Fax: [phone]

## 2023-04-23 NOTE — Patient Instructions (Addendum)
Medication Instructions:  Your physician recommends that you continue on your current medications as directed. Please refer to the Current Medication list given to you today.  *If you need a refill on your cardiac medications before your next appointment, please call your pharmacy*    Testing/Procedures: Cancel Stress test and cardiac Cath  CT of chest for lung nodules    Follow-Up: At [facility], you and your health needs are our priority.  As part of our continuing mission to provide you with exceptional heart care, we have created designated Provider Care Teams.  These Care Teams include your primary Cardiologist (physician) and Advanced Practice Providers (APPs -  Physician Assistants and Nurse Practitioners) who all work together to provide you with the care you need, when you need it.   Your next appointment:   9 month(s)  Provider:   Lynnette Caffey

## 2023-05-04 ENCOUNTER — Ambulatory Visit: Payer: Medicare Other

## 2023-05-05 ENCOUNTER — Ambulatory Visit (HOSPITAL_COMMUNITY)
Admission: RE | Admit: 2023-05-05 | Discharge: 2023-05-05 | Disposition: A | Discharge status: Home / Self Care | Payer: Medicare Other | Source: Ambulatory Visit | Attending: Internal Medicine | Admitting: Internal Medicine

## 2023-05-05 DIAGNOSIS — R911 Solitary pulmonary nodule: Secondary | ICD-10-CM | POA: Diagnosis not present

## 2023-05-05 DIAGNOSIS — J984 Other disorders of lung: Secondary | ICD-10-CM | POA: Diagnosis not present

## 2023-05-05 DIAGNOSIS — R918 Other nonspecific abnormal finding of lung field: Secondary | ICD-10-CM | POA: Diagnosis not present

## 2023-05-10 ENCOUNTER — Ambulatory Visit (HOSPITAL_COMMUNITY): Admit: 2023-05-10 | Payer: Medicare Other | Admitting: Interventional Cardiology

## 2023-05-10 ENCOUNTER — Encounter (HOSPITAL_COMMUNITY): Payer: Self-pay

## 2023-05-10 SURGERY — RIGHT/LEFT HEART CATH AND CORONARY ANGIOGRAPHY
Anesthesia: LOCAL

## 2023-05-18 ENCOUNTER — Ambulatory Visit: Payer: Medicare Other | Attending: Internal Medicine | Admitting: *Deleted

## 2023-05-18 DIAGNOSIS — Z5181 Encounter for therapeutic drug level monitoring: Secondary | ICD-10-CM | POA: Diagnosis not present

## 2023-05-18 DIAGNOSIS — Z953 Presence of xenogenic heart valve: Secondary | ICD-10-CM | POA: Insufficient documentation

## 2023-05-18 DIAGNOSIS — I4821 Permanent atrial fibrillation: Secondary | ICD-10-CM | POA: Diagnosis not present

## 2023-05-18 DIAGNOSIS — Z952 Presence of prosthetic heart valve: Secondary | ICD-10-CM | POA: Diagnosis not present

## 2023-05-18 LAB — POCT INR: INR: 1.8 — AB (ref 2.0–3.0)

## 2023-05-18 NOTE — Patient Instructions (Addendum)
Description   Take 1.5 tablets of warfarin today, then START Warfarin 1 tablet daily. Recheck INR in 3 weeks. [facility]in Clinic [phone].

## 2023-05-25 DIAGNOSIS — H40013 Open angle with borderline findings, low risk, bilateral: Secondary | ICD-10-CM | POA: Diagnosis not present

## 2023-05-25 DIAGNOSIS — H401132 Primary open-angle glaucoma, bilateral, moderate stage: Secondary | ICD-10-CM | POA: Diagnosis not present

## 2023-05-25 DIAGNOSIS — H353222 Exudative age-related macular degeneration, left eye, with inactive choroidal neovascularization: Secondary | ICD-10-CM | POA: Diagnosis not present

## 2023-05-25 DIAGNOSIS — H18423 Band keratopathy, bilateral: Secondary | ICD-10-CM | POA: Diagnosis not present

## 2023-05-25 DIAGNOSIS — H353113 Nonexudative age-related macular degeneration, right eye, advanced atrophic without subfoveal involvement: Secondary | ICD-10-CM | POA: Diagnosis not present

## 2023-05-25 DIAGNOSIS — H4051X0 Glaucoma secondary to other eye disorders, right eye, stage unspecified: Secondary | ICD-10-CM | POA: Diagnosis not present

## 2023-05-25 DIAGNOSIS — H35351 Cystoid macular degeneration, right eye: Secondary | ICD-10-CM | POA: Diagnosis not present

## 2023-05-25 DIAGNOSIS — H353211 Exudative age-related macular degeneration, right eye, with active choroidal neovascularization: Secondary | ICD-10-CM | POA: Diagnosis not present

## 2023-05-31 DIAGNOSIS — L578 Other skin changes due to chronic exposure to nonionizing radiation: Secondary | ICD-10-CM | POA: Diagnosis not present

## 2023-05-31 DIAGNOSIS — I509 Heart failure, unspecified: Secondary | ICD-10-CM | POA: Diagnosis not present

## 2023-05-31 DIAGNOSIS — L570 Actinic keratosis: Secondary | ICD-10-CM | POA: Diagnosis not present

## 2023-05-31 DIAGNOSIS — Z952 Presence of prosthetic heart valve: Secondary | ICD-10-CM | POA: Diagnosis not present

## 2023-05-31 DIAGNOSIS — I10 Essential (primary) hypertension: Secondary | ICD-10-CM | POA: Diagnosis not present

## 2023-05-31 DIAGNOSIS — E782 Mixed hyperlipidemia: Secondary | ICD-10-CM | POA: Diagnosis not present

## 2023-05-31 DIAGNOSIS — L814 Other melanin hyperpigmentation: Secondary | ICD-10-CM | POA: Diagnosis not present

## 2023-05-31 DIAGNOSIS — D225 Melanocytic nevi of trunk: Secondary | ICD-10-CM | POA: Diagnosis not present

## 2023-05-31 DIAGNOSIS — Z8679 Personal history of other diseases of the circulatory system: Secondary | ICD-10-CM | POA: Diagnosis not present

## 2023-05-31 DIAGNOSIS — Z0000 Encounter for general adult medical examination without abnormal findings: Secondary | ICD-10-CM | POA: Diagnosis not present

## 2023-06-08 ENCOUNTER — Ambulatory Visit: Payer: Medicare Other | Attending: Cardiovascular Disease

## 2023-06-08 DIAGNOSIS — Z953 Presence of xenogenic heart valve: Secondary | ICD-10-CM | POA: Insufficient documentation

## 2023-06-08 DIAGNOSIS — Z952 Presence of prosthetic heart valve: Secondary | ICD-10-CM | POA: Diagnosis not present

## 2023-06-08 DIAGNOSIS — Z5181 Encounter for therapeutic drug level monitoring: Secondary | ICD-10-CM | POA: Diagnosis not present

## 2023-06-08 DIAGNOSIS — I4821 Permanent atrial fibrillation: Secondary | ICD-10-CM | POA: Diagnosis not present

## 2023-06-08 LAB — POCT INR: INR: 2.6 (ref 2.0–3.0)

## 2023-06-08 NOTE — Patient Instructions (Signed)
Continue 1 tablet daily. Recheck INR in 6 weeks. [facility]in [facility] [phone].

## 2023-07-06 DIAGNOSIS — H4051X0 Glaucoma secondary to other eye disorders, right eye, stage unspecified: Secondary | ICD-10-CM | POA: Diagnosis not present

## 2023-07-06 DIAGNOSIS — H40013 Open angle with borderline findings, low risk, bilateral: Secondary | ICD-10-CM | POA: Diagnosis not present

## 2023-07-06 DIAGNOSIS — H35351 Cystoid macular degeneration, right eye: Secondary | ICD-10-CM | POA: Diagnosis not present

## 2023-07-06 DIAGNOSIS — H18423 Band keratopathy, bilateral: Secondary | ICD-10-CM | POA: Diagnosis not present

## 2023-07-06 DIAGNOSIS — H401132 Primary open-angle glaucoma, bilateral, moderate stage: Secondary | ICD-10-CM | POA: Diagnosis not present

## 2023-07-06 DIAGNOSIS — H353113 Nonexudative age-related macular degeneration, right eye, advanced atrophic without subfoveal involvement: Secondary | ICD-10-CM | POA: Diagnosis not present

## 2023-07-06 DIAGNOSIS — H353222 Exudative age-related macular degeneration, left eye, with inactive choroidal neovascularization: Secondary | ICD-10-CM | POA: Diagnosis not present

## 2023-07-06 DIAGNOSIS — H353211 Exudative age-related macular degeneration, right eye, with active choroidal neovascularization: Secondary | ICD-10-CM | POA: Diagnosis not present

## 2023-07-20 ENCOUNTER — Ambulatory Visit: Payer: Medicare Other | Attending: Cardiovascular Disease | Admitting: *Deleted

## 2023-07-20 DIAGNOSIS — Z5181 Encounter for therapeutic drug level monitoring: Secondary | ICD-10-CM | POA: Insufficient documentation

## 2023-07-20 DIAGNOSIS — Z953 Presence of xenogenic heart valve: Secondary | ICD-10-CM | POA: Insufficient documentation

## 2023-07-20 DIAGNOSIS — Z952 Presence of prosthetic heart valve: Secondary | ICD-10-CM | POA: Insufficient documentation

## 2023-07-20 DIAGNOSIS — I4821 Permanent atrial fibrillation: Secondary | ICD-10-CM | POA: Diagnosis not present

## 2023-07-20 LAB — POCT INR: INR: 2.6 (ref 2.0–3.0)

## 2023-07-20 NOTE — Patient Instructions (Signed)
Description   °Continue taking warfarin 1 tablet daily.  Recheck INR in 6 weeks. [facility] [phone].  °  °  °

## 2023-07-23 DIAGNOSIS — Z23 Encounter for immunization: Secondary | ICD-10-CM | POA: Diagnosis not present

## 2023-08-17 DIAGNOSIS — H401132 Primary open-angle glaucoma, bilateral, moderate stage: Secondary | ICD-10-CM | POA: Diagnosis not present

## 2023-08-17 DIAGNOSIS — H18423 Band keratopathy, bilateral: Secondary | ICD-10-CM | POA: Diagnosis not present

## 2023-08-17 DIAGNOSIS — H353211 Exudative age-related macular degeneration, right eye, with active choroidal neovascularization: Secondary | ICD-10-CM | POA: Diagnosis not present

## 2023-08-17 DIAGNOSIS — H353113 Nonexudative age-related macular degeneration, right eye, advanced atrophic without subfoveal involvement: Secondary | ICD-10-CM | POA: Diagnosis not present

## 2023-08-17 DIAGNOSIS — H401131 Primary open-angle glaucoma, bilateral, mild stage: Secondary | ICD-10-CM | POA: Diagnosis not present

## 2023-08-17 DIAGNOSIS — H40013 Open angle with borderline findings, low risk, bilateral: Secondary | ICD-10-CM | POA: Diagnosis not present

## 2023-08-17 DIAGNOSIS — H35351 Cystoid macular degeneration, right eye: Secondary | ICD-10-CM | POA: Diagnosis not present

## 2023-08-17 DIAGNOSIS — H353222 Exudative age-related macular degeneration, left eye, with inactive choroidal neovascularization: Secondary | ICD-10-CM | POA: Diagnosis not present

## 2023-08-17 DIAGNOSIS — H4051X0 Glaucoma secondary to other eye disorders, right eye, stage unspecified: Secondary | ICD-10-CM | POA: Diagnosis not present

## 2023-08-31 ENCOUNTER — Ambulatory Visit: Payer: Medicare Other | Attending: Cardiology

## 2023-08-31 DIAGNOSIS — Z5181 Encounter for therapeutic drug level monitoring: Secondary | ICD-10-CM

## 2023-08-31 DIAGNOSIS — I4821 Permanent atrial fibrillation: Secondary | ICD-10-CM | POA: Diagnosis not present

## 2023-08-31 DIAGNOSIS — Z953 Presence of xenogenic heart valve: Secondary | ICD-10-CM | POA: Diagnosis not present

## 2023-08-31 DIAGNOSIS — Z952 Presence of prosthetic heart valve: Secondary | ICD-10-CM

## 2023-08-31 LAB — POCT INR: INR: 1.8 — AB (ref 2.0–3.0)

## 2023-08-31 NOTE — Patient Instructions (Signed)
TAKE 2 TABLETS TONIGHT ONLY THEN Continue taking warfarin 1 tablet daily. Recheck INR in 6 weeks. [facility]in [facility] [phone].

## 2023-09-27 ENCOUNTER — Other Ambulatory Visit: Payer: Self-pay | Admitting: Physician Assistant

## 2023-09-27 DIAGNOSIS — Z952 Presence of prosthetic heart valve: Secondary | ICD-10-CM

## 2023-09-28 DIAGNOSIS — H18423 Band keratopathy, bilateral: Secondary | ICD-10-CM | POA: Diagnosis not present

## 2023-09-28 DIAGNOSIS — H353222 Exudative age-related macular degeneration, left eye, with inactive choroidal neovascularization: Secondary | ICD-10-CM | POA: Diagnosis not present

## 2023-09-28 DIAGNOSIS — H40013 Open angle with borderline findings, low risk, bilateral: Secondary | ICD-10-CM | POA: Diagnosis not present

## 2023-09-28 DIAGNOSIS — H401131 Primary open-angle glaucoma, bilateral, mild stage: Secondary | ICD-10-CM | POA: Diagnosis not present

## 2023-09-28 DIAGNOSIS — H353113 Nonexudative age-related macular degeneration, right eye, advanced atrophic without subfoveal involvement: Secondary | ICD-10-CM | POA: Diagnosis not present

## 2023-09-28 DIAGNOSIS — H35351 Cystoid macular degeneration, right eye: Secondary | ICD-10-CM | POA: Diagnosis not present

## 2023-09-28 DIAGNOSIS — H353211 Exudative age-related macular degeneration, right eye, with active choroidal neovascularization: Secondary | ICD-10-CM | POA: Diagnosis not present

## 2023-09-28 DIAGNOSIS — H4051X0 Glaucoma secondary to other eye disorders, right eye, stage unspecified: Secondary | ICD-10-CM | POA: Diagnosis not present

## 2023-09-28 DIAGNOSIS — H401132 Primary open-angle glaucoma, bilateral, moderate stage: Secondary | ICD-10-CM | POA: Diagnosis not present

## 2023-10-12 ENCOUNTER — Ambulatory Visit: Payer: Medicare Other | Attending: Cardiovascular Disease | Admitting: *Deleted

## 2023-10-12 DIAGNOSIS — I4821 Permanent atrial fibrillation: Secondary | ICD-10-CM

## 2023-10-12 DIAGNOSIS — Z5181 Encounter for therapeutic drug level monitoring: Secondary | ICD-10-CM

## 2023-10-12 DIAGNOSIS — Z952 Presence of prosthetic heart valve: Secondary | ICD-10-CM | POA: Diagnosis not present

## 2023-10-12 DIAGNOSIS — Z953 Presence of xenogenic heart valve: Secondary | ICD-10-CM | POA: Diagnosis not present

## 2023-10-12 LAB — POCT INR: INR: 2 (ref 2.0–3.0)

## 2023-10-12 NOTE — Patient Instructions (Signed)
Description   TAKE 1.5 TABLETS TONIGHT ONLY THEN Continue taking warfarin 1 tablet daily. Recheck INR in 6 weeks. [facility]in Clinic [phone].

## 2023-11-09 DIAGNOSIS — H401132 Primary open-angle glaucoma, bilateral, moderate stage: Secondary | ICD-10-CM | POA: Diagnosis not present

## 2023-11-09 DIAGNOSIS — H40013 Open angle with borderline findings, low risk, bilateral: Secondary | ICD-10-CM | POA: Diagnosis not present

## 2023-11-09 DIAGNOSIS — H401131 Primary open-angle glaucoma, bilateral, mild stage: Secondary | ICD-10-CM | POA: Diagnosis not present

## 2023-11-09 DIAGNOSIS — H353222 Exudative age-related macular degeneration, left eye, with inactive choroidal neovascularization: Secondary | ICD-10-CM | POA: Diagnosis not present

## 2023-11-09 DIAGNOSIS — H353211 Exudative age-related macular degeneration, right eye, with active choroidal neovascularization: Secondary | ICD-10-CM | POA: Diagnosis not present

## 2023-11-09 DIAGNOSIS — H353113 Nonexudative age-related macular degeneration, right eye, advanced atrophic without subfoveal involvement: Secondary | ICD-10-CM | POA: Diagnosis not present

## 2023-11-09 DIAGNOSIS — H35371 Puckering of macula, right eye: Secondary | ICD-10-CM | POA: Diagnosis not present

## 2023-11-09 DIAGNOSIS — H35351 Cystoid macular degeneration, right eye: Secondary | ICD-10-CM | POA: Diagnosis not present

## 2023-11-09 DIAGNOSIS — H18423 Band keratopathy, bilateral: Secondary | ICD-10-CM | POA: Diagnosis not present

## 2023-11-23 ENCOUNTER — Ambulatory Visit: Payer: Medicare Other | Attending: Cardiovascular Disease | Admitting: *Deleted

## 2023-11-23 DIAGNOSIS — I4821 Permanent atrial fibrillation: Secondary | ICD-10-CM | POA: Diagnosis not present

## 2023-11-23 DIAGNOSIS — Z952 Presence of prosthetic heart valve: Secondary | ICD-10-CM | POA: Diagnosis not present

## 2023-11-23 DIAGNOSIS — Z953 Presence of xenogenic heart valve: Secondary | ICD-10-CM | POA: Insufficient documentation

## 2023-11-23 DIAGNOSIS — Z5181 Encounter for therapeutic drug level monitoring: Secondary | ICD-10-CM | POA: Insufficient documentation

## 2023-11-23 LAB — POCT INR: INR: 1.7 — AB (ref 2.0–3.0)

## 2023-11-23 NOTE — Patient Instructions (Addendum)
Description   TAKE 1.5 TABLETS TONIGHT THEN START taking warfarin 1 tablet daily except 1.5 tablets on Tuesdays. Recheck INR in 4 weeks. [facility] [phone].

## 2023-12-09 DIAGNOSIS — Z85828 Personal history of other malignant neoplasm of skin: Secondary | ICD-10-CM | POA: Diagnosis not present

## 2023-12-09 DIAGNOSIS — L814 Other melanin hyperpigmentation: Secondary | ICD-10-CM | POA: Diagnosis not present

## 2023-12-09 DIAGNOSIS — Z08 Encounter for follow-up examination after completed treatment for malignant neoplasm: Secondary | ICD-10-CM | POA: Diagnosis not present

## 2023-12-09 DIAGNOSIS — L821 Other seborrheic keratosis: Secondary | ICD-10-CM | POA: Diagnosis not present

## 2023-12-09 DIAGNOSIS — L538 Other specified erythematous conditions: Secondary | ICD-10-CM | POA: Diagnosis not present

## 2023-12-09 DIAGNOSIS — C44519 Basal cell carcinoma of skin of other part of trunk: Secondary | ICD-10-CM | POA: Diagnosis not present

## 2023-12-09 DIAGNOSIS — D492 Neoplasm of unspecified behavior of bone, soft tissue, and skin: Secondary | ICD-10-CM | POA: Diagnosis not present

## 2023-12-09 DIAGNOSIS — D225 Melanocytic nevi of trunk: Secondary | ICD-10-CM | POA: Diagnosis not present

## 2023-12-09 DIAGNOSIS — D0461 Carcinoma in situ of skin of right upper limb, including shoulder: Secondary | ICD-10-CM | POA: Diagnosis not present

## 2023-12-18 ENCOUNTER — Other Ambulatory Visit: Payer: Self-pay | Admitting: Interventional Cardiology

## 2023-12-21 DIAGNOSIS — H353113 Nonexudative age-related macular degeneration, right eye, advanced atrophic without subfoveal involvement: Secondary | ICD-10-CM | POA: Diagnosis not present

## 2023-12-21 DIAGNOSIS — H353211 Exudative age-related macular degeneration, right eye, with active choroidal neovascularization: Secondary | ICD-10-CM | POA: Diagnosis not present

## 2023-12-21 DIAGNOSIS — H35371 Puckering of macula, right eye: Secondary | ICD-10-CM | POA: Diagnosis not present

## 2023-12-21 DIAGNOSIS — H40013 Open angle with borderline findings, low risk, bilateral: Secondary | ICD-10-CM | POA: Diagnosis not present

## 2023-12-21 DIAGNOSIS — I509 Heart failure, unspecified: Secondary | ICD-10-CM | POA: Diagnosis not present

## 2023-12-21 DIAGNOSIS — I10 Essential (primary) hypertension: Secondary | ICD-10-CM | POA: Diagnosis not present

## 2023-12-21 DIAGNOSIS — H35351 Cystoid macular degeneration, right eye: Secondary | ICD-10-CM | POA: Diagnosis not present

## 2023-12-21 DIAGNOSIS — H401131 Primary open-angle glaucoma, bilateral, mild stage: Secondary | ICD-10-CM | POA: Diagnosis not present

## 2023-12-21 DIAGNOSIS — H401132 Primary open-angle glaucoma, bilateral, moderate stage: Secondary | ICD-10-CM | POA: Diagnosis not present

## 2023-12-21 DIAGNOSIS — H353222 Exudative age-related macular degeneration, left eye, with inactive choroidal neovascularization: Secondary | ICD-10-CM | POA: Diagnosis not present

## 2023-12-22 ENCOUNTER — Ambulatory Visit: Payer: Medicare Other | Attending: Interventional Cardiology

## 2023-12-22 DIAGNOSIS — I4821 Permanent atrial fibrillation: Secondary | ICD-10-CM | POA: Insufficient documentation

## 2023-12-22 DIAGNOSIS — Z952 Presence of prosthetic heart valve: Secondary | ICD-10-CM | POA: Insufficient documentation

## 2023-12-22 LAB — POCT INR: INR: 2.7 (ref 2.0–3.0)

## 2023-12-22 NOTE — Patient Instructions (Signed)
 Description   Continue taking warfarin 1 tablet daily except 1.5 tablets on Tuesdays.  Recheck INR in 5 weeks.  C[facility]madin Clinic [phone].

## 2024-01-10 DIAGNOSIS — E782 Mixed hyperlipidemia: Secondary | ICD-10-CM | POA: Diagnosis not present

## 2024-01-10 DIAGNOSIS — I10 Essential (primary) hypertension: Secondary | ICD-10-CM | POA: Diagnosis not present

## 2024-01-10 DIAGNOSIS — I509 Heart failure, unspecified: Secondary | ICD-10-CM | POA: Diagnosis not present

## 2024-01-13 ENCOUNTER — Other Ambulatory Visit: Payer: Self-pay | Admitting: Internal Medicine

## 2024-01-17 ENCOUNTER — Ambulatory Visit: Payer: Medicare Other | Admitting: Internal Medicine

## 2024-01-17 ENCOUNTER — Ambulatory Visit (HOSPITAL_COMMUNITY): Payer: Medicare Other | Attending: Internal Medicine

## 2024-01-17 DIAGNOSIS — Z952 Presence of prosthetic heart valve: Secondary | ICD-10-CM | POA: Diagnosis not present

## 2024-01-17 LAB — ECHOCARDIOGRAM COMPLETE
AR max vel: 1.43 cm2
AV Area VTI: 1.38 cm2
AV Area mean vel: 1.38 cm2
AV Mean grad: 19.4 mmHg
AV Peak grad: 33.5 mmHg
Ao pk vel: 2.89 m/s
Area-P 1/2: 0.91 cm2
MV VTI: 1.23 cm2
S' Lateral: 3.61 cm
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
## 2024-01-19 DIAGNOSIS — I509 Heart failure, unspecified: Secondary | ICD-10-CM | POA: Diagnosis not present

## 2024-01-19 DIAGNOSIS — I10 Essential (primary) hypertension: Secondary | ICD-10-CM | POA: Diagnosis not present

## 2024-01-21 ENCOUNTER — Encounter: Payer: Self-pay | Admitting: Internal Medicine

## 2024-01-21 ENCOUNTER — Ambulatory Visit: Attending: Cardiology | Admitting: Internal Medicine

## 2024-01-21 VITALS — BP 126/74 | HR 64 | Ht 68.0 in | Wt 150.4 lb

## 2024-01-21 DIAGNOSIS — N1831 Chronic kidney disease, stage 3a: Secondary | ICD-10-CM | POA: Insufficient documentation

## 2024-01-21 DIAGNOSIS — E785 Hyperlipidemia, unspecified: Secondary | ICD-10-CM | POA: Diagnosis not present

## 2024-01-21 DIAGNOSIS — I700 Atherosclerosis of aorta: Secondary | ICD-10-CM | POA: Diagnosis not present

## 2024-01-21 DIAGNOSIS — I4821 Permanent atrial fibrillation: Secondary | ICD-10-CM | POA: Diagnosis not present

## 2024-01-21 DIAGNOSIS — I10 Essential (primary) hypertension: Secondary | ICD-10-CM | POA: Insufficient documentation

## 2024-01-21 DIAGNOSIS — I050 Rheumatic mitral stenosis: Secondary | ICD-10-CM | POA: Diagnosis not present

## 2024-01-21 DIAGNOSIS — Z952 Presence of prosthetic heart valve: Secondary | ICD-10-CM | POA: Insufficient documentation

## 2024-01-21 DIAGNOSIS — D6869 Other thrombophilia: Secondary | ICD-10-CM | POA: Insufficient documentation

## 2024-01-21 NOTE — Patient Instructions (Signed)
 Medication Instructions:  No changes *If you need a refill on your cardiac medications before your next appointment, please call your pharmacy*  Lab Work: No changes If you have labs (blood work) drawn today and your tests are completely normal, you will receive your results only by: MyChart Message (if you have MyChart) OR A paper copy in the mail If you have any lab test that is abnormal or we need to change your treatment, we will call you to review the results.  Testing/Procedures: ECHO DUE IN 6 MONTHS BEFORE NEXT VISIT Your physician has requested that you have an echocardiogram. Echocardiography is a painless test that uses sound waves to create images of your heart. It provides your doctor with information about the size and shape of your heart and how well your heart's chambers and valves are working. This procedure takes approximately one hour. There are no restrictions for this procedure. Please do NOT wear cologne, perfume, aftershave, or lotions (deodorant is allowed). Please arrive 15 minutes prior to your appointment time.  Please note: We ask at that you not bring children with you during ultrasound (echo/ vascular) testing. Due to room size and safety concerns, children are not allowed in the ultrasound rooms during exams. Our front office staff cannot provide observation of children in our lobby area while testing is being conducted. An adult accompanying a patient to their appointment will only be allowed in the ultrasound room at the discretion of the ultrasound technician under special circumstances. We apologize for any inconvenience.   Follow-Up: At [facility], you and your health needs are our priority.  As part of our continuing mission to provide you with exceptional heart care, our providers are all part of one team.  This team includes your primary Cardiologist (physician) and Advanced Practice Providers or APPs (Physician Assistants and Nurse Practitioners)  who all work together to provide you with the care you need, when you need it.  Your next appointment:   6 month(s) (after echo)  Provider:   Alverda Skeans, MD     1st Floor: - Lobby - Registration  - Pharmacy  - Lab - Cafe  2nd Floor: - PV Lab - Diagnostic Testing (echo, CT, nuclear med)  3rd Floor: - Vacant  4th Floor: - TCTS (cardiothoracic surgery) - AFib Clinic - Structural Heart Clinic - Vascular Surgery  - Vascular Ultrasound  5th Floor: - HeartCare Cardiology (general and EP) - Clinical Pharmacy for coumadin, hypertension, lipid, weight-loss medications, and med management appointments    Valet parking services will be available as well.

## 2024-01-26 ENCOUNTER — Ambulatory Visit: Attending: Interventional Cardiology

## 2024-01-26 DIAGNOSIS — Z952 Presence of prosthetic heart valve: Secondary | ICD-10-CM | POA: Diagnosis not present

## 2024-01-26 DIAGNOSIS — I4821 Permanent atrial fibrillation: Secondary | ICD-10-CM | POA: Diagnosis not present

## 2024-01-26 LAB — POCT INR: INR: 2.9 (ref 2.0–3.0)

## 2024-01-26 NOTE — Patient Instructions (Addendum)
 Description   Continue taking warfarin 1 tablet daily except 1.5 tablets on Tuesdays.  Recheck INR in 6 weeks.  Coumadin Clinic [phone].

## 2024-01-27 ENCOUNTER — Other Ambulatory Visit: Payer: Self-pay | Admitting: *Deleted

## 2024-01-27 DIAGNOSIS — I050 Rheumatic mitral stenosis: Secondary | ICD-10-CM

## 2024-01-27 DIAGNOSIS — Z952 Presence of prosthetic heart valve: Secondary | ICD-10-CM

## 2024-01-27 NOTE — Progress Notes (Signed)
 Order for echo in 6 months per last ov notes.

## 2024-02-01 DIAGNOSIS — H35371 Puckering of macula, right eye: Secondary | ICD-10-CM | POA: Diagnosis not present

## 2024-02-01 DIAGNOSIS — H353211 Exudative age-related macular degeneration, right eye, with active choroidal neovascularization: Secondary | ICD-10-CM | POA: Diagnosis not present

## 2024-02-01 DIAGNOSIS — H353113 Nonexudative age-related macular degeneration, right eye, advanced atrophic without subfoveal involvement: Secondary | ICD-10-CM | POA: Diagnosis not present

## 2024-02-01 DIAGNOSIS — H401132 Primary open-angle glaucoma, bilateral, moderate stage: Secondary | ICD-10-CM | POA: Diagnosis not present

## 2024-02-01 DIAGNOSIS — H18423 Band keratopathy, bilateral: Secondary | ICD-10-CM | POA: Diagnosis not present

## 2024-02-01 DIAGNOSIS — H40013 Open angle with borderline findings, low risk, bilateral: Secondary | ICD-10-CM | POA: Diagnosis not present

## 2024-02-01 DIAGNOSIS — H353222 Exudative age-related macular degeneration, left eye, with inactive choroidal neovascularization: Secondary | ICD-10-CM | POA: Diagnosis not present

## 2024-02-01 DIAGNOSIS — H4051X0 Glaucoma secondary to other eye disorders, right eye, stage unspecified: Secondary | ICD-10-CM | POA: Diagnosis not present

## 2024-02-01 DIAGNOSIS — H35351 Cystoid macular degeneration, right eye: Secondary | ICD-10-CM | POA: Diagnosis not present

## 2024-02-04 DIAGNOSIS — R350 Frequency of micturition: Secondary | ICD-10-CM | POA: Diagnosis not present

## 2024-02-04 DIAGNOSIS — N401 Enlarged prostate with lower urinary tract symptoms: Secondary | ICD-10-CM | POA: Diagnosis not present

## 2024-02-04 DIAGNOSIS — N433 Hydrocele, unspecified: Secondary | ICD-10-CM | POA: Diagnosis not present

## 2024-02-09 DIAGNOSIS — E782 Mixed hyperlipidemia: Secondary | ICD-10-CM | POA: Diagnosis not present

## 2024-02-09 DIAGNOSIS — I10 Essential (primary) hypertension: Secondary | ICD-10-CM | POA: Diagnosis not present

## 2024-02-09 DIAGNOSIS — I509 Heart failure, unspecified: Secondary | ICD-10-CM | POA: Diagnosis not present

## 2024-02-14 ENCOUNTER — Other Ambulatory Visit: Payer: Self-pay | Admitting: Internal Medicine

## 2024-02-18 DIAGNOSIS — I509 Heart failure, unspecified: Secondary | ICD-10-CM | POA: Diagnosis not present

## 2024-02-18 DIAGNOSIS — I10 Essential (primary) hypertension: Secondary | ICD-10-CM | POA: Diagnosis not present

## 2024-03-08 ENCOUNTER — Ambulatory Visit: Attending: Interventional Cardiology

## 2024-03-08 DIAGNOSIS — Z953 Presence of xenogenic heart valve: Secondary | ICD-10-CM | POA: Diagnosis not present

## 2024-03-08 DIAGNOSIS — I4821 Permanent atrial fibrillation: Secondary | ICD-10-CM | POA: Diagnosis not present

## 2024-03-08 DIAGNOSIS — Z952 Presence of prosthetic heart valve: Secondary | ICD-10-CM | POA: Diagnosis not present

## 2024-03-08 DIAGNOSIS — Z5181 Encounter for therapeutic drug level monitoring: Secondary | ICD-10-CM | POA: Diagnosis not present

## 2024-03-08 LAB — POCT INR: INR: 2.6 (ref 2.0–3.0)

## 2024-03-08 NOTE — Patient Instructions (Signed)
 Description   Continue taking warfarin 1 tablet daily except 1.5 tablets on Tuesdays.  Recheck INR in 6 weeks.  Coumadin Clinic [phone].

## 2024-03-11 DIAGNOSIS — E782 Mixed hyperlipidemia: Secondary | ICD-10-CM | POA: Diagnosis not present

## 2024-03-11 DIAGNOSIS — I509 Heart failure, unspecified: Secondary | ICD-10-CM | POA: Diagnosis not present

## 2024-03-11 DIAGNOSIS — I10 Essential (primary) hypertension: Secondary | ICD-10-CM | POA: Diagnosis not present

## 2024-03-15 DIAGNOSIS — H40013 Open angle with borderline findings, low risk, bilateral: Secondary | ICD-10-CM | POA: Diagnosis not present

## 2024-03-15 DIAGNOSIS — H353211 Exudative age-related macular degeneration, right eye, with active choroidal neovascularization: Secondary | ICD-10-CM | POA: Diagnosis not present

## 2024-03-15 DIAGNOSIS — H18423 Band keratopathy, bilateral: Secondary | ICD-10-CM | POA: Diagnosis not present

## 2024-03-15 DIAGNOSIS — H4051X0 Glaucoma secondary to other eye disorders, right eye, stage unspecified: Secondary | ICD-10-CM | POA: Diagnosis not present

## 2024-03-15 DIAGNOSIS — H401132 Primary open-angle glaucoma, bilateral, moderate stage: Secondary | ICD-10-CM | POA: Diagnosis not present

## 2024-03-15 DIAGNOSIS — H353222 Exudative age-related macular degeneration, left eye, with inactive choroidal neovascularization: Secondary | ICD-10-CM | POA: Diagnosis not present

## 2024-03-15 DIAGNOSIS — H35351 Cystoid macular degeneration, right eye: Secondary | ICD-10-CM | POA: Diagnosis not present

## 2024-03-15 DIAGNOSIS — H353113 Nonexudative age-related macular degeneration, right eye, advanced atrophic without subfoveal involvement: Secondary | ICD-10-CM | POA: Diagnosis not present

## 2024-03-15 DIAGNOSIS — H35371 Puckering of macula, right eye: Secondary | ICD-10-CM | POA: Diagnosis not present

## 2024-03-17 ENCOUNTER — Other Ambulatory Visit: Payer: Self-pay | Admitting: Interventional Cardiology

## 2024-03-17 NOTE — Telephone Encounter (Signed)
 I sent the patient a message asking him to clarify how he is taking the medication.

## 2024-03-19 DIAGNOSIS — I509 Heart failure, unspecified: Secondary | ICD-10-CM | POA: Diagnosis not present

## 2024-03-19 DIAGNOSIS — I10 Essential (primary) hypertension: Secondary | ICD-10-CM | POA: Diagnosis not present

## 2024-04-10 DIAGNOSIS — E782 Mixed hyperlipidemia: Secondary | ICD-10-CM | POA: Diagnosis not present

## 2024-04-10 DIAGNOSIS — I10 Essential (primary) hypertension: Secondary | ICD-10-CM | POA: Diagnosis not present

## 2024-04-10 DIAGNOSIS — I509 Heart failure, unspecified: Secondary | ICD-10-CM | POA: Diagnosis not present

## 2024-04-18 DIAGNOSIS — I509 Heart failure, unspecified: Secondary | ICD-10-CM | POA: Diagnosis not present

## 2024-04-18 DIAGNOSIS — I10 Essential (primary) hypertension: Secondary | ICD-10-CM | POA: Diagnosis not present

## 2024-04-19 ENCOUNTER — Ambulatory Visit: Attending: Interventional Cardiology

## 2024-04-19 DIAGNOSIS — I4821 Permanent atrial fibrillation: Secondary | ICD-10-CM | POA: Insufficient documentation

## 2024-04-19 DIAGNOSIS — Z952 Presence of prosthetic heart valve: Secondary | ICD-10-CM | POA: Diagnosis not present

## 2024-04-19 LAB — POCT INR: INR: 2.8 (ref 2.0–3.0)

## 2024-04-19 NOTE — Patient Instructions (Signed)
 Description   Continue taking warfarin 1 tablet daily except 1.5 tablets on Tuesdays.  Recheck INR in 6 weeks.  Coumadin Clinic [phone].

## 2024-04-19 NOTE — Progress Notes (Signed)
Please see anticoagulation encounter.

## 2024-04-27 DIAGNOSIS — H40013 Open angle with borderline findings, low risk, bilateral: Secondary | ICD-10-CM | POA: Diagnosis not present

## 2024-04-27 DIAGNOSIS — H18423 Band keratopathy, bilateral: Secondary | ICD-10-CM | POA: Diagnosis not present

## 2024-04-27 DIAGNOSIS — H4051X0 Glaucoma secondary to other eye disorders, right eye, stage unspecified: Secondary | ICD-10-CM | POA: Diagnosis not present

## 2024-04-27 DIAGNOSIS — H353113 Nonexudative age-related macular degeneration, right eye, advanced atrophic without subfoveal involvement: Secondary | ICD-10-CM | POA: Diagnosis not present

## 2024-04-27 DIAGNOSIS — H353211 Exudative age-related macular degeneration, right eye, with active choroidal neovascularization: Secondary | ICD-10-CM | POA: Diagnosis not present

## 2024-04-27 DIAGNOSIS — H35371 Puckering of macula, right eye: Secondary | ICD-10-CM | POA: Diagnosis not present

## 2024-04-27 DIAGNOSIS — H35351 Cystoid macular degeneration, right eye: Secondary | ICD-10-CM | POA: Diagnosis not present

## 2024-04-27 DIAGNOSIS — H401132 Primary open-angle glaucoma, bilateral, moderate stage: Secondary | ICD-10-CM | POA: Diagnosis not present

## 2024-04-27 DIAGNOSIS — H353222 Exudative age-related macular degeneration, left eye, with inactive choroidal neovascularization: Secondary | ICD-10-CM | POA: Diagnosis not present

## 2024-05-11 DIAGNOSIS — I509 Heart failure, unspecified: Secondary | ICD-10-CM | POA: Diagnosis not present

## 2024-05-11 DIAGNOSIS — I10 Essential (primary) hypertension: Secondary | ICD-10-CM | POA: Diagnosis not present

## 2024-05-11 DIAGNOSIS — E782 Mixed hyperlipidemia: Secondary | ICD-10-CM | POA: Diagnosis not present

## 2024-05-12 DIAGNOSIS — H353231 Exudative age-related macular degeneration, bilateral, with active choroidal neovascularization: Secondary | ICD-10-CM | POA: Diagnosis not present

## 2024-05-12 DIAGNOSIS — H401134 Primary open-angle glaucoma, bilateral, indeterminate stage: Secondary | ICD-10-CM | POA: Diagnosis not present

## 2024-05-12 DIAGNOSIS — Z961 Presence of intraocular lens: Secondary | ICD-10-CM | POA: Diagnosis not present

## 2024-05-12 DIAGNOSIS — H26492 Other secondary cataract, left eye: Secondary | ICD-10-CM | POA: Diagnosis not present

## 2024-05-18 DIAGNOSIS — I10 Essential (primary) hypertension: Secondary | ICD-10-CM | POA: Diagnosis not present

## 2024-05-18 DIAGNOSIS — I509 Heart failure, unspecified: Secondary | ICD-10-CM | POA: Diagnosis not present

## 2024-05-26 DIAGNOSIS — L089 Local infection of the skin and subcutaneous tissue, unspecified: Secondary | ICD-10-CM | POA: Diagnosis not present

## 2024-05-28 ENCOUNTER — Encounter (HOSPITAL_COMMUNITY): Payer: Self-pay

## 2024-05-28 ENCOUNTER — Other Ambulatory Visit: Payer: Self-pay

## 2024-05-28 ENCOUNTER — Emergency Department (HOSPITAL_COMMUNITY)

## 2024-05-28 ENCOUNTER — Observation Stay (HOSPITAL_COMMUNITY)
Admission: EM | Admit: 2024-05-28 | Discharge: 2024-05-31 | Disposition: A | Discharge status: Home / Self Care | Attending: Emergency Medicine | Admitting: Emergency Medicine

## 2024-05-28 DIAGNOSIS — Z880 Allergy status to penicillin: Secondary | ICD-10-CM | POA: Diagnosis not present

## 2024-05-28 DIAGNOSIS — Z953 Presence of xenogenic heart valve: Secondary | ICD-10-CM | POA: Diagnosis not present

## 2024-05-28 DIAGNOSIS — Z7901 Long term (current) use of anticoagulants: Secondary | ICD-10-CM | POA: Insufficient documentation

## 2024-05-28 DIAGNOSIS — N183 Chronic kidney disease, stage 3 unspecified: Secondary | ICD-10-CM | POA: Insufficient documentation

## 2024-05-28 DIAGNOSIS — Z79899 Other long term (current) drug therapy: Secondary | ICD-10-CM | POA: Diagnosis not present

## 2024-05-28 DIAGNOSIS — I499 Cardiac arrhythmia, unspecified: Secondary | ICD-10-CM | POA: Diagnosis not present

## 2024-05-28 DIAGNOSIS — I129 Hypertensive chronic kidney disease with stage 1 through stage 4 chronic kidney disease, or unspecified chronic kidney disease: Secondary | ICD-10-CM | POA: Insufficient documentation

## 2024-05-28 DIAGNOSIS — K439 Ventral hernia without obstruction or gangrene: Secondary | ICD-10-CM | POA: Diagnosis not present

## 2024-05-28 DIAGNOSIS — I10 Essential (primary) hypertension: Secondary | ICD-10-CM | POA: Diagnosis present

## 2024-05-28 DIAGNOSIS — K56609 Unspecified intestinal obstruction, unspecified as to partial versus complete obstruction: Principal | ICD-10-CM | POA: Diagnosis present

## 2024-05-28 DIAGNOSIS — Z87891 Personal history of nicotine dependence: Secondary | ICD-10-CM | POA: Diagnosis not present

## 2024-05-28 DIAGNOSIS — R1013 Epigastric pain: Secondary | ICD-10-CM | POA: Diagnosis not present

## 2024-05-28 DIAGNOSIS — E7800 Pure hypercholesterolemia, unspecified: Secondary | ICD-10-CM | POA: Diagnosis not present

## 2024-05-28 DIAGNOSIS — I4819 Other persistent atrial fibrillation: Secondary | ICD-10-CM | POA: Diagnosis not present

## 2024-05-28 DIAGNOSIS — N179 Acute kidney failure, unspecified: Secondary | ICD-10-CM | POA: Diagnosis not present

## 2024-05-28 DIAGNOSIS — D631 Anemia in chronic kidney disease: Secondary | ICD-10-CM | POA: Diagnosis not present

## 2024-05-28 DIAGNOSIS — R911 Solitary pulmonary nodule: Secondary | ICD-10-CM

## 2024-05-28 DIAGNOSIS — D649 Anemia, unspecified: Secondary | ICD-10-CM | POA: Insufficient documentation

## 2024-05-28 DIAGNOSIS — K449 Diaphragmatic hernia without obstruction or gangrene: Secondary | ICD-10-CM | POA: Diagnosis not present

## 2024-05-28 LAB — COMPREHENSIVE METABOLIC PANEL WITH GFR
ALT: 13 U/L (ref 0–44)
AST: 22 U/L (ref 15–41)
Albumin: 3.9 g/dL (ref 3.5–5.0)
Alkaline Phosphatase: 106 U/L (ref 38–126)
Anion gap: 11 (ref 5–15)
BUN: 15 mg/dL (ref 8–23)
CO2: 26 mmol/L (ref 22–32)
Calcium: 9.9 mg/dL (ref 8.9–10.3)
Chloride: 99 mmol/L (ref 98–111)
Creatinine, Ser: 1.35 mg/dL — ABNORMAL HIGH (ref 0.61–1.24)
GFR, Estimated: 50 mL/min — ABNORMAL LOW (ref >60)
Glucose, Bld: 139 mg/dL — ABNORMAL HIGH (ref 70–99)
Potassium: 4.4 mmol/L (ref 3.5–5.1)
Sodium: 136 mmol/L (ref 135–145)
Total Bilirubin: 1.1 mg/dL (ref 0.0–1.2)
Total Protein: 7 g/dL (ref 6.5–8.1)

## 2024-05-28 LAB — CBC WITH DIFFERENTIAL/PLATELET
Abs Immature Granulocytes: 0.17 K/uL — ABNORMAL HIGH (ref 0.00–0.07)
Basophils Absolute: 0 K/uL (ref 0.0–0.1)
Basophils Relative: 0 %
Eosinophils Absolute: 0 K/uL (ref 0.0–0.5)
Eosinophils Relative: 0 %
HCT: 38.6 % — ABNORMAL LOW (ref 39.0–52.0)
Hemoglobin: 12.6 g/dL — ABNORMAL LOW (ref 13.0–17.0)
Immature Granulocytes: 1 %
Lymphocytes Relative: 4 %
Lymphs Abs: 0.5 K/uL — ABNORMAL LOW (ref 0.7–4.0)
MCH: 29.4 pg (ref 26.0–34.0)
MCHC: 32.6 g/dL (ref 30.0–36.0)
MCV: 90 fL (ref 80.0–100.0)
Monocytes Absolute: 0.6 K/uL (ref 0.1–1.0)
Monocytes Relative: 5 %
Neutro Abs: 11.2 K/uL — ABNORMAL HIGH (ref 1.7–7.7)
Neutrophils Relative %: 90 %
Platelets: 204 K/uL (ref 150–400)
RBC: 4.29 MIL/uL (ref 4.22–5.81)
RDW: 13.3 % (ref 11.5–15.5)
WBC: 12.6 K/uL — ABNORMAL HIGH (ref 4.0–10.5)
nRBC: 0 % (ref 0.0–0.2)

## 2024-05-28 LAB — PROTIME-INR
INR: 2.3 — ABNORMAL HIGH (ref 0.8–1.2)
Prothrombin Time: 26.2 s — ABNORMAL HIGH (ref 11.4–15.2)

## 2024-05-28 LAB — I-STAT CG4 LACTIC ACID, ED: Lactic Acid, Venous: 1.1 mmol/L (ref 0.5–1.9)

## 2024-05-28 LAB — LIPASE, BLOOD: Lipase: 40 U/L (ref 11–51)

## 2024-05-28 MED ORDER — BRIMONIDINE TARTRATE 0.2 % OP SOLN
1.0000 [drp] | Freq: Two times a day (BID) | OPHTHALMIC | Status: DC
  Administered 2024-05-29 – 2024-05-31 (×5): 1 [drp] via OPHTHALMIC
  Filled 2024-05-28: qty 5

## 2024-05-28 MED ORDER — METOPROLOL SUCCINATE ER 25 MG PO TB24
12.5000 mg | ORAL_TABLET | Freq: Every day | ORAL | Status: DC
Start: 2024-05-29 — End: 2024-05-31
  Administered 2024-05-29 – 2024-05-31 (×3): 12.5 mg via ORAL
  Filled 2024-05-28 (×3): qty 1

## 2024-05-28 MED ORDER — SODIUM CHLORIDE 0.9 % IV SOLN
1.0000 g | INTRAVENOUS | Status: DC
  Administered 2024-05-29 – 2024-05-31 (×3): 1 g via INTRAVENOUS
  Filled 2024-05-28 (×3): qty 10

## 2024-05-28 MED ORDER — MORPHINE SULFATE (PF) 2 MG/ML IV SOLN
2.0000 mg | Freq: Once | INTRAVENOUS | Status: AC
  Administered 2024-05-28: 2 mg via INTRAVENOUS
  Filled 2024-05-28: qty 1

## 2024-05-28 MED ORDER — DILTIAZEM HCL ER COATED BEADS 120 MG PO CP24
120.0000 mg | ORAL_CAPSULE | Freq: Every day | ORAL | Status: DC
  Administered 2024-05-29 – 2024-05-31 (×3): 120 mg via ORAL
  Filled 2024-05-28 (×3): qty 1

## 2024-05-28 MED ORDER — ATORVASTATIN CALCIUM 10 MG PO TABS
20.0000 mg | ORAL_TABLET | Freq: Every day | ORAL | Status: DC
  Administered 2024-05-28 – 2024-05-30 (×3): 20 mg via ORAL
  Filled 2024-05-28 (×3): qty 2

## 2024-05-28 MED ORDER — LACTATED RINGERS IV BOLUS
500.0000 mL | Freq: Once | INTRAVENOUS | Status: AC
  Administered 2024-05-28: 500 mL via INTRAVENOUS

## 2024-05-28 MED ORDER — LISINOPRIL 10 MG PO TABS
10.0000 mg | ORAL_TABLET | Freq: Every day | ORAL | Status: DC
Start: 2024-05-29 — End: 2024-05-31
  Administered 2024-05-29 – 2024-05-31 (×3): 10 mg via ORAL
  Filled 2024-05-28 (×3): qty 1

## 2024-05-28 MED ORDER — MORPHINE SULFATE (PF) 2 MG/ML IV SOLN
1.0000 mg | INTRAVENOUS | Status: DC | PRN
  Administered 2024-05-29: 1 mg via INTRAVENOUS
  Filled 2024-05-28: qty 1

## 2024-05-28 MED ORDER — ACETAMINOPHEN 325 MG PO TABS: 650.0000 mg | ORAL_TABLET | Freq: Two times a day (BID) | ORAL | Status: DC

## 2024-05-28 MED ORDER — IOHEXOL 350 MG/ML SOLN
75.0000 mL | Freq: Once | INTRAVENOUS | Status: AC | PRN
  Administered 2024-05-28: 75 mL via INTRAVENOUS

## 2024-05-28 NOTE — ED Notes (Signed)
 CCMD contacted.

## 2024-05-28 NOTE — H&P (Incomplete)
 History and Physical    Jason [name] FMW:[id number] DOB: [birth year] DOA: 05/28/2024  Patient coming from: Home.  Chief Complaint: Abdominal pain.  HPI: Jason [name] is a 89 y.o. 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  Patient states he has having abdominal pain for almost a month but last 24 hours it got acutely worse with no associated nausea or vomiting.  Pain is centered mostly around the abdominal hernia.  Patient has had prior history of umbilical hernia repair and inguinal hernia repair.  Patient was recently placed on antibiotics for toe infection.  Patient takes Coumadin  and has not missed a dose.  ED Course: In the ER CT abdomen pelvis was done shows small bowel obstruction with transition point in the low central pelvis with inflamed small bowel extending from the transition point to the ileocecal wall.  May be due to infectious or inflammatory enteritis.  Ischemic bowel considered less likely but not excluded.  Given the moderate narrowing of the celiac axis and SMA.  Small 0.9 cm fat-containing supraumbilical hernia with associated stranding in the herniated fat.     Surgery was consulted at this time General Surgery feels that pain was secondary to strangulation of the fat in the abdominal hernia no definite clinical signs of obstruction.  Labs show creatinine 1.3 WBC 12.6 lactic acid 1.1 hemoglobin 12.6 platelets 204.    Review of Systems: As per HPI, rest all negative.   Past Medical History:  Diagnosis Date   AK (actinic keratosis)    Atrial fibrillation, persistent (HCC)    chronic coumadin  therapy   Bifascicular block    Blindness    left eye   CKD (chronic  kidney disease), stage III (HCC)    Coronary artery disease    20% RCA by 2014 cath   Elevated PSA    GERD (gastroesophageal reflux disease)    H/O hiatal hernia    Heart murmur    History of kidney stones    Hydrocele    left   Hyperlipidemia    Hypertension    Internal hemorrhoids    Kidney stones    kidney stones   Macular degeneration    right eye   Mitral regurgitation 02/25/2011   Recurrent MR s/p mitral valve repair then bioprosthetic MVR 2014   OA (osteoarthritis) of neck    S/P aortic valve replacement with bioprosthetic valve 08/15/2013   23mm Edwards Magna Ease bovine pericardial tissue valve   S/P mitral valve repair 09/15/2004   Complex valvuloplasty including artificial Goretex neocord placement x4 and 36mm SARP ring annuloplasty via right mini thoracotomy approach - Dr Alford @ [facility]   S/P redo mitral valve replacement and aortic valve replacement with bioprosthetic valves 08/15/2013   27mm Edwards Magna Mitral bovine pericardial tissue valve   Severe aortic stenosis 11/14   tissue AVR    Vocal cord paralysis     Past Surgical History:  Procedure Laterality Date   AORTIC VALVE REPLACEMENT N/A 08/15/2013   Procedure: AORTIC VALVE REPLACEMENT (AVR);  Surgeon: Sudie VEAR Laine, MD;  Location: [facility] OR;  Service: Open Heart Surgery;  Laterality: N/A;   CATARACT EXTRACTION Bilateral 2011   Dr. Camillo   ESOPHAGOGASTRODUODENOSCOPY N/A 08/02/2013   Procedure: ESOPHAGOGASTRODUODENOSCOPY (EGD);  Surgeon: Gordy Reek, MD;  Location: [facility] ENDOSCOPY;  Service: Cardiovascular;  Laterality: N/A;   EYE SURGERY Right    infection   GROIN MASS OPEN BIOPSY  2012   HEMORRHOID SURGERY     pt denies.   INGUINAL HERNIA REPAIR  08/2011   INTRAOPERATIVE TRANSESOPHAGEAL ECHOCARDIOGRAM N/A 08/15/2013   Procedure: INTRAOPERATIVE TRANSESOPHAGEAL ECHOCARDIOGRAM;  Surgeon: Sudie VEAR Laine, MD;  Location: [facility] OR;  Service: Open Heart Surgery;  Laterality: N/A;   LEFT AND RIGHT HEART CATHETERIZATION  WITH CORONARY ANGIOGRAM N/A 07/05/2013   Procedure: LEFT AND RIGHT HEART CATHETERIZATION WITH CORONARY ANGIOGRAM;  Surgeon: Candyce GORMAN Reek, MD;  Location: [facility] CATH LAB;  Service: Cardiovascular;  Laterality: N/A;   MASS EXCISION Right 03/30/2018   Procedure: EXCISION RIGHT FLANK  MASS;  Surgeon: Vernetta Berg, MD;  Location: [facility] OR;  Service: General;  Laterality: Right;   MITRAL VALVE REPAIR  09/16/2011   @ [facility], Dr Alford   MITRAL VALVE REPAIR N/A 08/15/2013   Procedure: REDO MITRAL VALVE (MV) REPLACEMENT;  Surgeon: Sudie VEAR Laine, MD;  Location: [facility] OR;  Service: Open Heart Surgery;  Laterality: N/A;   TEE WITHOUT CARDIOVERSION N/A 07/05/2013   Procedure: TRANSESOPHAGEAL ECHOCARDIOGRAM (TEE);  Surgeon: Gordy Reek, MD;  Location: [facility] ENDOSCOPY;  Service: Cardiovascular;  Laterality: N/A;   TEE WITHOUT CARDIOVERSION N/A 08/02/2013   Procedure: TRANSESOPHAGEAL ECHOCARDIOGRAM (TEE);  Surgeon: Gordy Reek, MD;  Location: [facility] ENDOSCOPY;  Service: Cardiovascular;  Laterality: N/A;   UMBILICAL HERNIA REPAIR N/A 03/30/2018   Procedure: HERNIA REPAIR UMBILICAL;  Surgeon: Vernetta Berg, MD;  Location: [facility] OR;  Service: General;  Laterality: N/A;     reports that he quit smoking about 45 years ago. His smoking use included cigarettes. He started smoking about 53 years ago. He has never used smokeless tobacco. He reports that he does not drink alcohol and does not use drugs.  Allergies  Allergen Reactions   Penicillins Other (See Comments) and Rash    Has patient had a PCN reaction causing immediate rash, facial/tongue/throat swelling, SOB or lightheadedness with hypotension: # # Yes # #  Has patient had a PCN reaction causing severe rash involving mucus membranes or skin necrosis: No  Has patient had a PCN reaction that required hospitalization: No  Has patient had a PCN reaction occurring within the last 10 years: No  If all of the above answers are NO, then may proceed with Cephalosporin  use.  Other Reaction(s): Other (See Comments), Unknown  Has patient had a PCN reaction causing immediate rash, facial/tongue/throat swelling, SOB or lightheadedness with hypotension: # # Yes # # Has patient had a PCN reaction causing severe rash involving mucus membranes or skin necrosis: No Has patient had a PCN reaction that required hospitalization: No Has patient had a PCN reaction occurring within the last 10 years: No If all of the above answers are NO, then may proceed with Cephalosporin use.    Family History  Problem Relation Age of Onset   Cancer Father        colon   Hypertension Father    Diabetes Father    Hyperlipidemia Father    Stroke Father    Hypertension Mother    Diabetes Mother    Hyperlipidemia Mother    Stroke Mother    Heart attack Mother    Hypertension Brother        MI, S/P MVR    Prior to Admission medications   Medication Sig Start Date End Date Taking? Authorizing Provider  acetaminophen  (TYLENOL ) 650  MG CR tablet Take 650 mg by mouth 2 (two) times daily.    Yes [provider]  Ascorbic Acid (VITAMIN C) 1000 MG tablet Take 1,000 mg by mouth daily.   Yes [provider]  atorvastatin  (LIPITOR) 20 MG tablet Take 20 mg by mouth at bedtime.  06/05/11  Yes [provider]  b complex vitamins tablet Take 1 tablet by mouth daily.   Yes [provider]  brimonidine  (ALPHAGAN ) 0.2 % ophthalmic solution Place 1 drop into both eyes 2 (two) times daily. 02/11/23  Yes [provider]  cephALEXin (KEFLEX) 500 MG capsule Take 500 mg by mouth every 6 (six) hours. 05/26/24  Yes [provider]  diltiazem  (CARDIZEM  CD) 120 MG 24 hr capsule TAKE 1 CAPSULE BY MOUTH EVERY DAY 02/15/24  Yes Thukkani, Arun K, MD  furosemide  (LASIX ) 40 MG tablet 40 mg in the am and 20 mg in the pm 06/19/14  Yes Dann Candyce RAMAN, MD  lisinopril  (ZESTRIL ) 10 MG tablet Take 10 mg by mouth daily. 08/14/19  Yes [provider]   metoprolol  succinate (TOPROL -XL) 25 MG 24 hr tablet Take 0.5 tablets (12.5 mg total) by mouth in the morning and at bedtime. 03/20/24  Yes Thukkani, Arun K, MD  Multiple Vitamins-Minerals (PRESERVISION AREDS PO) Take 1 tablet by mouth 2 (two) times daily.    Yes [provider]  warfarin (COUMADIN ) 5 MG tablet TAKE AS DIRECTED BY COUMADIN  CLINIC Patient taking differently: Take 5-7.5 mg by mouth See admin instructions. Take 1 tablet by mouth daily, except every Tuesday take 1.5 tablets or as directed by Coumadin  clinic 07/15/16  Yes Dann Candyce RAMAN, MD    Physical Exam: Constitutional: Moderately built and nourished. Vitals:   05/28/24 2045 05/28/24 2100 05/28/24 2110 05/28/24 2223  BP: (!) 156/58 139/68  (!) 167/84  Pulse: 78 91  81  Resp: 17 14  16   Temp:   98.4 F (36.9 C) 98.4 F (36.9 C)  TempSrc:   Temporal   SpO2: 95% 93%  93%  Weight:      Height:       Eyes: Anicteric no pallor. ENMT: No discharge from the ears eyes nose or mouth. Neck: No mass full.  No neck rigidity. Respiratory: No rhonchi or crepitations. Cardiovascular: S1-S2 heard. Abdomen: Abdominal hernia seen.  No guarding or rigidity. Musculoskeletal: No edema. Skin: No rash. Neurologic: Alert awake oriented time place and person.  Moves all extremities. Psychiatric: Feels normal.  Normal affect.   Labs on Admission: I have personally reviewed following labs and imaging studies  CBC: Recent Labs  Lab 05/28/24 1813  WBC 12.6*  NEUTROABS 11.2*  HGB 12.6*  HCT 38.6*  MCV 90.0  PLT 204   Basic Metabolic Panel: Recent Labs  Lab 05/28/24 1813  NA 136  K 4.4  CL 99  CO2 26  GLUCOSE 139*  BUN 15  CREATININE 1.35*  CALCIUM  9.9   GFR: Estimated Creatinine Clearance: 35.9 mL/min (A) (by C-G formula based on SCr of 1.35 mg/dL (H)). Liver Function Tests: Recent Labs  Lab 05/28/24 1813  AST 22  ALT 13  ALKPHOS 106  BILITOT 1.1  PROT 7.0  ALBUMIN  3.9   Recent Labs  Lab  05/28/24 1813  LIPASE 40   No results for input(s): AMMONIA in the last 168 hours. Coagulation Profile: Recent Labs  Lab 05/28/24 1813  INR 2.3*   Cardiac Enzymes: No results for input(s): CKTOTAL, CKMB, CKMBINDEX, TROPONINI in the last 168  hours. BNP (last 3 results) No results for input(s): PROBNP in the last 8760 hours. HbA1C: No results for input(s): HGBA1C in the last 72 hours. CBG: No results for input(s): GLUCAP in the last 168 hours. Lipid Profile: No results for input(s): CHOL, HDL, LDLCALC, TRIG, CHOLHDL, LDLDIRECT in the last 72 hours. Thyroid  Function Tests: No results for input(s): TSH, T4TOTAL, FREET4, T3FREE, THYROIDAB in the last 72 hours. Anemia Panel: No results for input(s): VITAMINB12, FOLATE, FERRITIN, TIBC, IRON, RETICCTPCT in the last 72 hours. Urine analysis:    Component Value Date/Time   COLORURINE YELLOW 08/11/2013 1552   APPEARANCEUR CLEAR 08/11/2013 1552   LABSPEC 1.014 08/11/2013 1552   PHURINE 5.0 08/11/2013 1552   GLUCOSEU NEGATIVE 08/11/2013 1552   HGBUR NEGATIVE 08/11/2013 1552   BILIRUBINUR NEGATIVE 08/11/2013 1552   KETONESUR NEGATIVE 08/11/2013 1552   PROTEINUR NEGATIVE 08/11/2013 1552   UROBILINOGEN 0.2 08/11/2013 1552   NITRITE NEGATIVE 08/11/2013 1552   LEUKOCYTESUR NEGATIVE 08/11/2013 1552   Sepsis Labs: @LABRCNTIP (procalcitonin:4,lacticidven:4) )No results found for this or any previous visit (from the past 240 hours).   Radiological Exams on Admission: CT ABDOMEN PELVIS W CONTRAST Result Date: 05/28/2024 CLINICAL DATA:  Abdominal wall hernia suspected. Upper abdominal pain rated 10/10. Noticed a mass about a week ago. Palpable mass in upper abdomen in the midline. History of hernia with multiple repairs. EXAM: CT ABDOMEN AND PELVIS WITH CONTRAST TECHNIQUE: Multidetector CT imaging of the abdomen and pelvis was performed using the standard protocol following bolus administration  of intravenous contrast. RADIATION DOSE REDUCTION: This exam was performed according to the departmental dose-optimization program which includes automated exposure control, adjustment of the mA and/or kV according to patient size and/or use of iterative reconstruction technique. CONTRAST:  75mL OMNIPAQUE  IOHEXOL  350 MG/ML SOLN COMPARISON:  CT 07/24/2013 FINDINGS: Lower chest: 1.9 x 1.4 cm irregular nodule in the right middle lobe on series 7, image 21 was not present on 05/05/2023. This is concerning for malignancy and dedicated chest CT is recommended. Cardiomegaly. Hepatobiliary: Unchanged hypoattenuating lesions in the liver compatible with cysts. Cholelithiasis. No evidence of acute cholecystitis. No biliary dilation. Pancreas: Unremarkable. Spleen: Unremarkable. Adrenals/Urinary Tract: Normal adrenal glands. Cortical renal scarring in both kidneys. Nonobstructing left nephrolithiasis no hydronephrosis. Unremarkable bladder. Stomach/Bowel: Small hiatal hernia. The lower esophagus is distended with fluid. Normal appendix. Dilated loops of small bowel throughout the abdomen with abrupt transition point in the low central pelvis immediately distal to a fecalized segment of small bowel (series 3, image [id number]). There is wall thickening and mucosal hyperenhancement of the small bowel immediately distal to the transition point extending to the ileocecal valve. Normal caliber colon without wall thickening. Vascular/Lymphatic: Advanced arterial atherosclerotic calcification including the aorta and its mesenteric, renal, and iliac artery branches. 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 No lymphadenopathy. Reproductive: Enlarged prostate.  Large right hydrocele. Other: Small 0.9 cm fat containing supraumbilical hernia. There is associated stranding in the herniated fat. Correlate for strangulation/incarceration. No free intraperitoneal fluid or air. Musculoskeletal: No acute fracture.  IMPRESSION: 1. Small-bowel obstruction with transition point in the low central pelvis. 2. Inflamed small bowel extends from the transition point to the ileocecal valve. This may be due to infectious or inflammatory enteritis. Ischemic bowel considered less likely though not excluded given at least moderate narrowing of the celiac axis and SMA due to advanced arterial atherosclerosis. 3. Small 0.9 cm fat containing supraumbilical hernia with associated stranding in the herniated fat. Correlate for  strangulation/incarceration. 4. 1.9 cm irregular nodule in the right middle lobe was not present on 05/05/2023. This is concerning for malignancy and dedicated chest CT is recommended. 5. Large right hydrocele. 6. Small hiatal hernia.  Fluid-filled esophagus suggesting reflux. 7. Aortic Atherosclerosis (ICD10-I70.0). Electronically Signed   By: Norman Gatlin M.D.   On: 05/28/2024 19:38      Assessment/Plan Principal Problem:   SBO (small bowel obstruction) (HCC) Active Problems:   Atrial fibrillation, persistent (HCC)   Chronic anticoagulation   S/P redo mitral valve replacement and aortic valve replacement with bioprosthetic valves Aug 15 2013   ARF (acute renal failure) (HCC)    Abdominal pain -    appreciate general surgery consult.  General surgery at this time feels patient's abdominal pain could be from the strangulation of the fat-containing hernia.  There is small bowel dilatation but patient clinically does not have obstruction per surgery.  At this time patient admitted for observation and okay to have clear liquid diet.  General surgery would consider repair of the hernia if it continues to cause pain.  Will need cardiac preoperative risk stratification. Lung nodule concerning for malignancy.  Will get CT chest with contrast. History of A-fib presently rate controlled on Cardizem  metoprolol .  In anticipation of possible surgery patient will be bridged with heparin .  Discussed with  pharmacy. History of bioprosthetic mitral valve replacement with moderate to severe stenosis and history of aortic valve replacement.  Will consult cardiology for preop risk stratification.  Hold Lasix  for now. Hypertension on lisinopril  Cardizem  and metoprolol . Recent toe infection on ceftriaxone .  Used to take Keflex. Chronic kidney disease stage III creatinine at around baseline. Anemia likely from renal disease follow CBC. Hyperlipidemia on statins.  Since patient has abdominal pain with possible hernia causing the pain will need close monitoring further workup and more than 2 midnight stay.   DVT prophylaxis: Heparin  infusion. Code Status: Full code. Family Communication: Discussed with patient. Disposition Plan: Monitored bed. Consults called: General Surgery and cardiology. Admission status: Observation.

## 2024-05-28 NOTE — ED Provider Notes (Signed)
 [place] EMERGENCY DEPARTMENT AT [facility] Provider Note   CSN: [id number] Arrival date & time: 05/28/24  1725     Patient presents with: No chief complaint on file.   Jason [name] is a 89 y.o. male.   HPI 89 year old male presents with abdominal pain and concern for hernia.  He has had what he thinks is a hernia to his upper abdomen for over a year.  For the past week it has been painful and particularly more painful today.  No vomiting but a little bit of nausea.  The pain worsens without any clear cause but is generally present at all times.  No chest pain or shortness of breath.  He is on warfarin for A-fib. Rates the pain as an 8.  Prior to Admission medications   Medication Sig Start Date End Date Taking? Authorizing Provider  acetaminophen  (TYLENOL ) 650 MG CR tablet Take 650 mg by mouth 2 (two) times daily.    Yes [provider]  Ascorbic Acid (VITAMIN C) 1000 MG tablet Take 1,000 mg by mouth daily.   Yes [provider]  atorvastatin  (LIPITOR) 20 MG tablet Take 20 mg by mouth at bedtime.  06/05/11  Yes [provider]  b complex vitamins tablet Take 1 tablet by mouth daily.   Yes [provider]  brimonidine  (ALPHAGAN ) 0.2 % ophthalmic solution Place 1 drop into both eyes 2 (two) times daily. 02/11/23  Yes [provider]  cephALEXin (KEFLEX) 500 MG capsule Take 500 mg by mouth every 6 (six) hours. 05/26/24  Yes [provider]  diltiazem  (CARDIZEM  CD) 120 MG 24 hr capsule TAKE 1 CAPSULE BY MOUTH EVERY DAY 02/15/24  Yes Thukkani, Arun K, MD  furosemide  (LASIX ) 40 MG tablet 40 mg in the am and 20 mg in the pm 06/19/14  Yes Dann Candyce RAMAN, MD  lisinopril  (ZESTRIL ) 10 MG tablet Take 10 mg by mouth daily. 08/14/19  Yes [provider]  metoprolol  succinate (TOPROL -XL) 25 MG 24 hr tablet Take 0.5 tablets (12.5 mg total) by mouth in the morning and at bedtime. 03/20/24  Yes Thukkani, Arun K, MD  Multiple  Vitamins-Minerals (PRESERVISION AREDS PO) Take 1 tablet by mouth 2 (two) times daily.    Yes [provider]  warfarin (COUMADIN ) 5 MG tablet TAKE AS DIRECTED BY COUMADIN  CLINIC Patient taking differently: Take 5-7.5 mg by mouth See admin instructions. Take 1 tablet by mouth daily, except every Tuesday take 1.5 tablets or as directed by Coumadin  clinic 07/15/16  Yes Dann Candyce RAMAN, MD    Allergies: Penicillins    Review of Systems  Respiratory:  Negative for shortness of breath.   Cardiovascular:  Negative for chest pain.  Gastrointestinal:  Positive for abdominal pain. Negative for constipation, diarrhea and vomiting.    Updated Vital Signs BP (!) 167/84 (BP Location: Right Arm)   Pulse 81   Temp 98.4 F (36.9 C)   Resp 16   Ht 5' 8 (1.727 m)   Wt 68.9 kg   SpO2 93%   BMI 23.11 kg/m   Physical Exam Vitals and nursing note reviewed.  Constitutional:      General: He is not in acute distress.    Appearance: He is well-developed. He is not ill-appearing or diaphoretic.  HENT:     Head: Normocephalic and atraumatic.  Cardiovascular:     Rate and Rhythm: Normal rate. Rhythm irregular.     Heart sounds: Normal heart sounds.  Pulmonary:  Effort: Pulmonary effort is normal.     Breath sounds: Normal breath sounds.  Abdominal:     Palpations: Abdomen is soft.     Tenderness: There is abdominal tenderness.     Hernia: A hernia is present.   Skin:    General: Skin is warm and dry.  Neurological:     Mental Status: He is alert.     (all labs ordered are listed, but only abnormal results are displayed) Labs Reviewed  CBC WITH DIFFERENTIAL/PLATELET - Abnormal; Notable for the following components:      Result Value   WBC 12.6 (*)    Hemoglobin 12.6 (*)    HCT 38.6 (*)    Neutro Abs 11.2 (*)    Lymphs Abs 0.5 (*)    Abs Immature Granulocytes 0.17 (*)    All other components within normal limits  COMPREHENSIVE METABOLIC PANEL WITH GFR - Abnormal;  Notable for the following components:   Glucose, Bld 139 (*)    Creatinine, Ser 1.35 (*)    GFR, Estimated 50 (*)    All other components within normal limits  PROTIME-INR - Abnormal; Notable for the following components:   Prothrombin Time 26.2 (*)    INR 2.3 (*)    All other components within normal limits  LIPASE, BLOOD  I-STAT CG4 LACTIC ACID, ED    EKG: None  Radiology: CT ABDOMEN PELVIS W CONTRAST Result Date: 05/28/2024 CLINICAL DATA:  Abdominal wall hernia suspected. Upper abdominal pain rated 10/10. Noticed a mass about a week ago. Palpable mass in upper abdomen in the midline. History of hernia with multiple repairs. EXAM: CT ABDOMEN AND PELVIS WITH CONTRAST TECHNIQUE: Multidetector CT imaging of the abdomen and pelvis was performed using the standard protocol following bolus administration of intravenous contrast. RADIATION DOSE REDUCTION: This exam was performed according to the departmental dose-optimization program which includes automated exposure control, adjustment of the mA and/or kV according to patient size and/or use of iterative reconstruction technique. CONTRAST:  75mL OMNIPAQUE  IOHEXOL  350 MG/ML SOLN COMPARISON:  CT 07/24/2013 FINDINGS: Lower chest: 1.9 x 1.4 cm irregular nodule in the right middle lobe on series 7, image 21 was not present on 05/05/2023. This is concerning for malignancy and dedicated chest CT is recommended. Cardiomegaly. Hepatobiliary: Unchanged hypoattenuating lesions in the liver compatible with cysts. Cholelithiasis. No evidence of acute cholecystitis. No biliary dilation. Pancreas: Unremarkable. Spleen: Unremarkable. Adrenals/Urinary Tract: Normal adrenal glands. Cortical renal scarring in both kidneys. Nonobstructing left nephrolithiasis no hydronephrosis. Unremarkable bladder. Stomach/Bowel: Small hiatal hernia. The lower esophagus is distended with fluid. Normal appendix. Dilated loops of small bowel throughout the abdomen with abrupt transition  point in the low central pelvis immediately distal to a fecalized segment of small bowel (series 3, image 59-64). There is wall thickening and mucosal hyperenhancement of the small bowel immediately distal to the transition point extending to the ileocecal valve. Normal caliber colon without wall thickening. Vascular/Lymphatic: Advanced arterial atherosclerotic calcification including the aorta and its mesenteric, renal, and iliac artery branches. 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 No lymphadenopathy. Reproductive: Enlarged prostate.  Large right hydrocele. Other: Small 0.9 cm fat containing supraumbilical hernia. There is associated stranding in the herniated fat. Correlate for strangulation/incarceration. No free intraperitoneal fluid or air. Musculoskeletal: No acute fracture. IMPRESSION: 1. Small-bowel obstruction with transition point in the low central pelvis. 2. Inflamed small bowel extends from the transition point to the ileocecal valve. This may be due to infectious or  inflammatory enteritis. Ischemic bowel considered less likely though not excluded given at least moderate narrowing of the celiac axis and SMA due to advanced arterial atherosclerosis. 3. Small 0.9 cm fat containing supraumbilical hernia with associated stranding in the herniated fat. Correlate for strangulation/incarceration. 4. 1.9 cm irregular nodule in the right middle lobe was not present on 05/05/2023. This is concerning for malignancy and dedicated chest CT is recommended. 5. Large right hydrocele. 6. Small hiatal hernia.  Fluid-filled esophagus suggesting reflux. 7. Aortic Atherosclerosis (ICD10-I70.0). Electronically Signed   By: Norman Gatlin M.D.   On: 05/28/2024 19:38     Procedures   Medications Ordered in the ED  lactated ringers  bolus 500 mL (0 mLs Intravenous Stopped 05/28/24 1848)  morphine  (PF) 2 MG/ML injection 2 mg (2 mg Intravenous Given 05/28/24 1810)  iohexol  (OMNIPAQUE )  350 MG/ML injection 75 mL (75 mLs Intravenous Contrast Given 05/28/24 1922)                                    Medical Decision Making Amount and/or Complexity of Data Reviewed Labs: ordered.    Details: Mild leukocytosis Radiology: ordered and independent interpretation performed.    Details: Small bowel obstruction  Risk Prescription drug management. Decision regarding hospitalization.   Patient has a small upper abdominal wall hernia.  Mildly bulging and I was able to reduce it at least partially though it still feels like there is a little bit of contents in the hernia.  No significant change in his symptoms.  Still requesting pain meds and was given a small dose of morphine  which has helped.  CT shows small bowel obstruction.  However the transition point is not near the hernia.  I discussed with Dr. Dasie of general surgery who is consulted and feels that at this point it is unclear if this obstruction is even symptomatic.  Recommends observation and I have discussed with Dr. Franky who will admit.  General surgery may consider repairing this hernia if he gets cardiac clearance but for now there is no need for emergent surgery.  I highly doubt that this is ischemic bowel based on presentation, normal lactate, well appearance.     Final diagnoses:  Small bowel obstruction (HCC)  Abdominal wall hernia    ED Discharge Orders     None          Freddi Hamilton, MD 05/28/24 2248

## 2024-05-28 NOTE — ED Notes (Signed)
 Patient transported to CT scan .

## 2024-05-28 NOTE — ED Notes (Signed)
 [facility] notified that patient is being transported to the unit .

## 2024-05-28 NOTE — Progress Notes (Signed)
 PHARMACY - ANTICOAGULATION CONSULT NOTE  Pharmacy Consult for warfarin >> heparin  Indication: atrial fibrillation  Patient Measurements: Height: 5' 8 (172.7 cm) Weight: 68.9 kg (152 lb) IBW/kg (Calculated) : 68.4 HEPARIN  DW (KG): 68.9  Vital Signs: Temp: 98.4 F (36.9 C) (08/17 2223) Temp Source: Temporal (08/17 2110) BP: 167/84 (08/17 2223) Pulse Rate: 81 (08/17 2223)  Labs: Recent Labs    05/28/24 1813  HGB 12.6*  HCT 38.6*  PLT 204  LABPROT 26.2*  INR 2.3*  CREATININE 1.35*    Estimated Creatinine Clearance: 35.9 mL/min (A) (by C-G formula based on SCr of 1.35 mg/dL (H)).   Medical History: Past Medical History:  Diagnosis Date   AK (actinic keratosis)    Atrial fibrillation, persistent (HCC)    chronic coumadin  therapy   Bifascicular block    Blindness    left eye   CKD (chronic kidney disease), stage III (HCC)    Coronary artery disease    20% RCA by 2014 cath   Elevated PSA    GERD (gastroesophageal reflux disease)    H/O hiatal hernia    Heart murmur    History of kidney stones    Hydrocele    left   Hyperlipidemia    Hypertension    Internal hemorrhoids    Kidney stones    kidney stones   Macular degeneration    right eye   Mitral regurgitation 02/25/2011   Recurrent MR s/p mitral valve repair then bioprosthetic MVR 2014   OA (osteoarthritis) of neck    S/P aortic valve replacement with bioprosthetic valve 08/15/2013   23mm Edwards Magna Ease bovine pericardial tissue valve   S/P mitral valve repair 09/15/2004   Complex valvuloplasty including artificial Goretex neocord placement x4 and 36mm SARP ring annuloplasty via right mini thoracotomy approach - Dr Alford @ [facility]   S/P redo mitral valve replacement and aortic valve replacement with bioprosthetic valves 08/15/2013   27mm Edwards Magna Mitral bovine pericardial tissue valve   Severe aortic stenosis 11/14   tissue AVR    Vocal cord paralysis     Medications:  -Warfarin 7.5mg  PO every  Tuesday; 5mg  PO all other days (Last dose: 8/17)  Assessment: 47 yoM presented to ED with pain at site of a known hernia. PMH includes history of afib, bAVR, bMVR, and HTN. Pharmacy consulted to dose heparin  for afib.  -Last INR 2.3 -Hgb 12.6, plts 204  Goal of Therapy:  INR 2-3 Heparin  level 0.3-0.7 units/ml Monitor platelets by anticoagulation protocol: Yes   Plan:  HOLD heparin  until INR <2 Start heparin  infusion at 1000 units/hr once INR <2 Check anti-Xa level in 8 hours and daily while on heparin  Continue to monitor H&H and platelets Follow up if needs repair of hernia if continues to have pain  Follow up transition back to Coumadin    Lynwood Poplar, PharmD, BCPS Clinical Pharmacist 05/29/2024 12:18 AM

## 2024-05-28 NOTE — Consult Note (Signed)
 Jason [name] 06-20-1932  [id number].    Requesting MD: Glendia Breeding, MD Chief Complaint/Reason for Consult: hernia, possible bowel obstruction  HPI:  Jason [name] is an 89 yo male with a history of mitral valve replacement, now with valve stenosis, aortic valve replacement, a-fib and hypertension who presented to the ED with pain at the site of a known hernia. He says he has had the hernia for about a year, but it did not start bothering him until about a month ago. He has had increasing pain at the site of the hernia, and it became very severe today, prompting him to come to the ED. WBC is 12.6. A CT scan shows a small fat-containing epigastric hernia. The scan also noted a small bowel obstruction with a transition in the pelvis, and some inflamed small bowel in the TI. The patient denies abdominal distension, nausea and vomiting and reports he has been having bowel function today.  Prior abdominal surgeries include an umbilical hernia repair and inguinal hernia repair. He is on Coumadin  and INR is 2.3. He reports he does housework regularly at home and denies shortness of breath with activity. His most recent echo in April 2025 showed a LVEF of 55-60%, with moderate to severe mitral stenosis.  ROS: Review of Systems  Constitutional:  Negative for chills and fever.  Gastrointestinal:  Positive for abdominal pain. Negative for constipation, nausea and vomiting.    Family History  Problem Relation Age of Onset   Cancer Father        colon   Hypertension Father    Diabetes Father    Hyperlipidemia Father    Stroke Father    Hypertension Mother    Diabetes Mother    Hyperlipidemia Mother    Stroke Mother    Heart attack Mother    Hypertension Brother        MI, S/P MVR    Past Medical History:  Diagnosis Date   AK (actinic keratosis)    Atrial fibrillation, persistent (HCC)    chronic coumadin  therapy   Bifascicular block    Blindness    left eye   CKD (chronic kidney  disease), stage III (HCC)    Coronary artery disease    20% RCA by 2014 cath   Elevated PSA    GERD (gastroesophageal reflux disease)    H/O hiatal hernia    Heart murmur    History of kidney stones    Hydrocele    left   Hyperlipidemia    Hypertension    Internal hemorrhoids    Kidney stones    kidney stones   Macular degeneration    right eye   Mitral regurgitation 02/25/2011   Recurrent MR s/p mitral valve repair then bioprosthetic MVR 2014   OA (osteoarthritis) of neck    S/P aortic valve replacement with bioprosthetic valve 08/15/2013   23mm Edwards Magna Ease bovine pericardial tissue valve   S/P mitral valve repair 09/15/2004   Complex valvuloplasty including artificial Goretex neocord placement x4 and 36mm SARP ring annuloplasty via right mini thoracotomy approach - Dr Alford @ [facility]   S/P redo mitral valve replacement and aortic valve replacement with bioprosthetic valves 08/15/2013   27mm Edwards Magna Mitral bovine pericardial tissue valve   Severe aortic stenosis 11/14   tissue AVR    Vocal cord paralysis     Past Surgical History:  Procedure Laterality Date   AORTIC VALVE REPLACEMENT N/A 08/15/2013   Procedure: AORTIC VALVE REPLACEMENT (AVR);  Surgeon: Sudie VEAR Laine, MD;  Location: [facility] OR;  Service: Open Heart Surgery;  Laterality: N/A;   CATARACT EXTRACTION Bilateral 2011   Dr. Camillo   ESOPHAGOGASTRODUODENOSCOPY N/A 08/02/2013   Procedure: ESOPHAGOGASTRODUODENOSCOPY (EGD);  Surgeon: Gordy Reek, MD;  Location: [facility] ENDOSCOPY;  Service: Cardiovascular;  Laterality: N/A;   EYE SURGERY Right    infection   GROIN MASS OPEN BIOPSY  2012   HEMORRHOID SURGERY     pt denies.   INGUINAL HERNIA REPAIR  08/2011   INTRAOPERATIVE TRANSESOPHAGEAL ECHOCARDIOGRAM N/A 08/15/2013   Procedure: INTRAOPERATIVE TRANSESOPHAGEAL ECHOCARDIOGRAM;  Surgeon: Sudie VEAR Laine, MD;  Location: [facility] OR;  Service: Open Heart Surgery;  Laterality: N/A;   LEFT AND RIGHT HEART CATHETERIZATION WITH  CORONARY ANGIOGRAM N/A 07/05/2013   Procedure: LEFT AND RIGHT HEART CATHETERIZATION WITH CORONARY ANGIOGRAM;  Surgeon: Candyce GORMAN Reek, MD;  Location: [facility] CATH LAB;  Service: Cardiovascular;  Laterality: N/A;   MASS EXCISION Right 03/30/2018   Procedure: EXCISION RIGHT FLANK  MASS;  Surgeon: Vernetta Berg, MD;  Location: [facility] OR;  Service: General;  Laterality: Right;   MITRAL VALVE REPAIR  09/16/2011   @ [facility], Dr Alford   MITRAL VALVE REPAIR N/A 08/15/2013   Procedure: REDO MITRAL VALVE (MV) REPLACEMENT;  Surgeon: Sudie VEAR Laine, MD;  Location: [facility] OR;  Service: Open Heart Surgery;  Laterality: N/A;   TEE WITHOUT CARDIOVERSION N/A 07/05/2013   Procedure: TRANSESOPHAGEAL ECHOCARDIOGRAM (TEE);  Surgeon: Gordy Reek, MD;  Location: [facility] ENDOSCOPY;  Service: Cardiovascular;  Laterality: N/A;   TEE WITHOUT CARDIOVERSION N/A 08/02/2013   Procedure: TRANSESOPHAGEAL ECHOCARDIOGRAM (TEE);  Surgeon: Gordy Reek, MD;  Location: [facility] ENDOSCOPY;  Service: Cardiovascular;  Laterality: N/A;   UMBILICAL HERNIA REPAIR N/A 03/30/2018   Procedure: HERNIA REPAIR UMBILICAL;  Surgeon: Vernetta Berg, MD;  Location: [facility] OR;  Service: General;  Laterality: N/A;    Social History:  reports that he quit smoking about 45 years ago. His smoking use included cigarettes. He started smoking about 53 years ago. He has never used smokeless tobacco. He reports that he does not drink alcohol and does not use drugs.  Allergies:  Allergies  Allergen Reactions   Penicillins Other (See Comments) and Rash    Has patient had a PCN reaction causing immediate rash, facial/tongue/throat swelling, SOB or lightheadedness with hypotension: # # Yes # #  Has patient had a PCN reaction causing severe rash involving mucus membranes or skin necrosis: No  Has patient had a PCN reaction that required hospitalization: No  Has patient had a PCN reaction occurring within the last 10 years: No  If all of the above answers are NO, then may  proceed with Cephalosporin use.  Other Reaction(s): Other (See Comments), Unknown  Has patient had a PCN reaction causing immediate rash, facial/tongue/throat swelling, SOB or lightheadedness with hypotension: # # Yes # # Has patient had a PCN reaction causing severe rash involving mucus membranes or skin necrosis: No Has patient had a PCN reaction that required hospitalization: No Has patient had a PCN reaction occurring within the last 10 years: No If all of the above answers are NO, then may proceed with Cephalosporin use.    (Not in a hospital admission)    Physical Exam: Blood pressure 139/68, pulse 91, temperature 98.4 F (36.9 C), temperature source Temporal, resp. rate 14, height 5' 8 (1.727 m), weight 68.9 kg, SpO2 93%. General: resting comfortably, appears stated age, no apparent distress Neurological: alert and oriented, no focal deficits, cranial nerves grossly  in tact HEENT: normocephalic, atraumatic CV: RRR, median sternotomy scar Respiratory: normal work of breathing on room air Abdomen: soft, nondistended. Mild focal tenderness at the site of a subcutaneous mass in the midline epigastric area, corresponding to the known hernia; hernia is not reducible. Abdomen is otherwise nontender to palpation. Extremities: warm and well-perfused, no deformities, moving all extremities spontaneously Psychiatric: normal mood and affect Skin: warm and dry, no jaundice, no rashes or lesions   Results for orders placed or performed during the hospital encounter of 05/28/24 (from the past 48 hours)  CBC with Differential     Status: Abnormal   Collection Time: 05/28/24  6:13 PM  Result Value Ref Range   WBC 12.6 (H) 4.0 - 10.5 K/uL   RBC 4.29 4.22 - 5.81 MIL/uL   Hemoglobin 12.6 (L) 13.0 - 17.0 g/dL   HCT 61.3 (L) 60.9 - 47.9 %   MCV 90.0 80.0 - 100.0 fL   MCH 29.4 26.0 - 34.0 pg   MCHC 32.6 30.0 - 36.0 g/dL   RDW 86.6 88.4 - 84.4 %   Platelets 204 150 - 400 K/uL   nRBC 0.0 0.0 -  0.2 %   Neutrophils Relative % 90 %   Neutro Abs 11.2 (H) 1.7 - 7.7 K/uL   Lymphocytes Relative 4 %   Lymphs Abs 0.5 (L) 0.7 - 4.0 K/uL   Monocytes Relative 5 %   Monocytes Absolute 0.6 0.1 - 1.0 K/uL   Eosinophils Relative 0 %   Eosinophils Absolute 0.0 0.0 - 0.5 K/uL   Basophils Relative 0 %   Basophils Absolute 0.0 0.0 - 0.1 K/uL   Immature Granulocytes 1 %   Abs Immature Granulocytes 0.17 (H) 0.00 - 0.07 K/uL    Comment: Performed at [facility], [address], [place], [place] [address]  Comprehensive metabolic panel     Status: Abnormal   Collection Time: 05/28/24  6:13 PM  Result Value Ref Range   Sodium 136 135 - 145 mmol/L   Potassium 4.4 3.5 - 5.1 mmol/L   Chloride 99 98 - 111 mmol/L   CO2 26 22 - 32 mmol/L   Glucose, Bld 139 (H) 70 - 99 mg/dL    Comment: Glucose reference range applies only to samples taken after fasting for at least 8 hours.   BUN 15 8 - 23 mg/dL   Creatinine, Ser 8.64 (H) 0.61 - 1.24 mg/dL   Calcium  9.9 8.9 - 10.3 mg/dL   Total Protein 7.0 6.5 - 8.1 g/dL   Albumin  3.9 3.5 - 5.0 g/dL   AST 22 15 - 41 U/L   ALT 13 0 - 44 U/L   Alkaline Phosphatase 106 38 - 126 U/L   Total Bilirubin 1.1 0.0 - 1.2 mg/dL   GFR, Estimated 50 (L) >60 mL/min    Comment: (NOTE) Calculated using the CKD-EPI Creatinine Equation (2021)    Anion gap 11 5 - 15    Comment: Performed at [facility], [address] [place], [place] [address]  Lipase, blood     Status: None   Collection Time: 05/28/24  6:13 PM  Result Value Ref Range   Lipase 40 11 - 51 U/L    Comment: Performed at [facility], [address], [place], [place] [address]  Protime-INR     Status: Abnormal   Collection Time: 05/28/24  6:13 PM  Result Value Ref Range   Prothrombin Time 26.2 (H) 11.4 - 15.2 seconds  INR 2.3 (H) 0.8 - 1.2    Comment: (NOTE) INR goal varies based on device and disease states. Performed at [facility], [address] [place], [place] [address]    I-Stat Lactic Acid     Status: None   Collection Time: 05/28/24  6:20 PM  Result Value Ref Range   Lactic Acid, Venous 1.1 0.5 - 1.9 mmol/L   CT ABDOMEN PELVIS W CONTRAST Result Date: 05/28/2024 CLINICAL DATA:  Abdominal wall hernia suspected. Upper abdominal pain rated 10/10. Noticed a mass about a week ago. Palpable mass in upper abdomen in the midline. History of hernia with multiple repairs. EXAM: CT ABDOMEN AND PELVIS WITH CONTRAST TECHNIQUE: Multidetector CT imaging of the abdomen and pelvis was performed using the standard protocol following bolus administration of intravenous contrast. RADIATION DOSE REDUCTION: This exam was performed according to the departmental dose-optimization program which includes automated exposure control, adjustment of the mA and/or kV according to patient size and/or use of iterative reconstruction technique. CONTRAST:  75mL OMNIPAQUE  IOHEXOL  350 MG/ML SOLN COMPARISON:  CT 07/24/2013 FINDINGS: Lower chest: 1.9 x 1.4 cm irregular nodule in the right middle lobe on series 7, image 21 was not present on 05/05/2023. This is concerning for malignancy and dedicated chest CT is recommended. Cardiomegaly. Hepatobiliary: Unchanged hypoattenuating lesions in the liver compatible with cysts. Cholelithiasis. No evidence of acute cholecystitis. No biliary dilation. Pancreas: Unremarkable. Spleen: Unremarkable. Adrenals/Urinary Tract: Normal adrenal glands. Cortical renal scarring in both kidneys. Nonobstructing left nephrolithiasis no hydronephrosis. Unremarkable bladder. Stomach/Bowel: Small hiatal hernia. The lower esophagus is distended with fluid. Normal appendix. Dilated loops of small bowel throughout the abdomen with abrupt transition point in the low central pelvis immediately distal to a fecalized segment of small bowel (series 3, image [id number]). There is wall thickening and mucosal hyperenhancement of the small bowel immediately distal to the transition point extending to the  ileocecal valve. Normal caliber colon without wall thickening. Vascular/Lymphatic: Advanced arterial atherosclerotic calcification including the aorta and its mesenteric, renal, and iliac artery branches. 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 No lymphadenopathy. Reproductive: Enlarged prostate.  Large right hydrocele. Other: Small 0.9 cm fat containing supraumbilical hernia. There is associated stranding in the herniated fat. Correlate for strangulation/incarceration. No free intraperitoneal fluid or air. Musculoskeletal: No acute fracture. IMPRESSION: 1. Small-bowel obstruction with transition point in the low central pelvis. 2. Inflamed small bowel extends from the transition point to the ileocecal valve. This may be due to infectious or inflammatory enteritis. Ischemic bowel considered less likely though not excluded given at least moderate narrowing of the celiac axis and SMA due to advanced arterial atherosclerosis. 3. Small 0.9 cm fat containing supraumbilical hernia with associated stranding in the herniated fat. Correlate for strangulation/incarceration. 4. 1.9 cm irregular nodule in the right middle lobe was not present on 05/05/2023. This is concerning for malignancy and dedicated chest CT is recommended. 5. Large right hydrocele. 6. Small hiatal hernia.  Fluid-filled esophagus suggesting reflux. 7. Aortic Atherosclerosis (ICD10-I70.0). Electronically Signed   By: Norman Gatlin M.D.   On: 05/28/2024 19:38      Assessment/Plan 89 yo male with a history of MVR, AVR and a-fib, presenting with pain at the site of an epigastric hernia. I personally reviewed his labs, imaging and notes. CT shows only fat within the hernia, no bowel. The pain is likely secondary to strangulation of the fat, but there is no emergent indication for surgery. There is small bowel  dilation, but the patient is not clinically obstructed, thus would not treat as an obstruction. The apparent  transition zone is in the pelvis, far away from the hernia defect, so it is not likely that this small bowel dilation is related to recent incarceration of small bowel within the hernia. Ok for patient to have clear liquids and advance as tolerated. The patient is being admitted to the hospitalist service. Can consider repair of the hernia if it continues to cause him pain, however he will need preoperative risk stratification by cardiology first.  - Clear liquid diet, advance as tolerated if no nausea, vomiting or abdominal distension - Please hold Coumadin , ok to anticoagulate with heparin  gtt - Will follow while admitted   Leonor Dawn, MD [facility] Surgery General, Hepatobiliary and Pancreatic Surgery 05/28/24 9:36 PM

## 2024-05-28 NOTE — ED Triage Notes (Signed)
 Pt presents to ED via EMS from home with upper abdominal pain rated 10/10. Noticed a mass about a week ago. Palpable mass in upper abdomen midline. Hx of hernia with repairs. Mild nausea, no vomiting or diarrhea. GCS 15.

## 2024-05-29 ENCOUNTER — Observation Stay (HOSPITAL_COMMUNITY)

## 2024-05-29 DIAGNOSIS — R918 Other nonspecific abnormal finding of lung field: Secondary | ICD-10-CM | POA: Diagnosis not present

## 2024-05-29 DIAGNOSIS — I4821 Permanent atrial fibrillation: Secondary | ICD-10-CM | POA: Diagnosis not present

## 2024-05-29 DIAGNOSIS — J432 Centrilobular emphysema: Secondary | ICD-10-CM | POA: Diagnosis not present

## 2024-05-29 DIAGNOSIS — Z7901 Long term (current) use of anticoagulants: Secondary | ICD-10-CM | POA: Diagnosis not present

## 2024-05-29 DIAGNOSIS — I129 Hypertensive chronic kidney disease with stage 1 through stage 4 chronic kidney disease, or unspecified chronic kidney disease: Secondary | ICD-10-CM | POA: Diagnosis not present

## 2024-05-29 DIAGNOSIS — D649 Anemia, unspecified: Secondary | ICD-10-CM | POA: Insufficient documentation

## 2024-05-29 DIAGNOSIS — K439 Ventral hernia without obstruction or gangrene: Secondary | ICD-10-CM | POA: Diagnosis not present

## 2024-05-29 DIAGNOSIS — Z79899 Other long term (current) drug therapy: Secondary | ICD-10-CM | POA: Diagnosis not present

## 2024-05-29 DIAGNOSIS — N179 Acute kidney failure, unspecified: Secondary | ICD-10-CM | POA: Diagnosis not present

## 2024-05-29 DIAGNOSIS — Z880 Allergy status to penicillin: Secondary | ICD-10-CM | POA: Diagnosis not present

## 2024-05-29 DIAGNOSIS — I10 Essential (primary) hypertension: Secondary | ICD-10-CM | POA: Diagnosis not present

## 2024-05-29 DIAGNOSIS — I499 Cardiac arrhythmia, unspecified: Secondary | ICD-10-CM | POA: Diagnosis not present

## 2024-05-29 DIAGNOSIS — I700 Atherosclerosis of aorta: Secondary | ICD-10-CM | POA: Diagnosis not present

## 2024-05-29 DIAGNOSIS — Z87891 Personal history of nicotine dependence: Secondary | ICD-10-CM | POA: Diagnosis not present

## 2024-05-29 DIAGNOSIS — R911 Solitary pulmonary nodule: Secondary | ICD-10-CM | POA: Diagnosis not present

## 2024-05-29 DIAGNOSIS — N183 Chronic kidney disease, stage 3 unspecified: Secondary | ICD-10-CM | POA: Insufficient documentation

## 2024-05-29 DIAGNOSIS — D631 Anemia in chronic kidney disease: Secondary | ICD-10-CM | POA: Diagnosis not present

## 2024-05-29 DIAGNOSIS — Z01810 Encounter for preprocedural cardiovascular examination: Secondary | ICD-10-CM

## 2024-05-29 DIAGNOSIS — K56609 Unspecified intestinal obstruction, unspecified as to partial versus complete obstruction: Secondary | ICD-10-CM | POA: Diagnosis not present

## 2024-05-29 DIAGNOSIS — E7800 Pure hypercholesterolemia, unspecified: Secondary | ICD-10-CM | POA: Diagnosis not present

## 2024-05-29 DIAGNOSIS — I4819 Other persistent atrial fibrillation: Secondary | ICD-10-CM | POA: Diagnosis not present

## 2024-05-29 LAB — CBC
HCT: 34.7 % — ABNORMAL LOW (ref 39.0–52.0)
Hemoglobin: 11.3 g/dL — ABNORMAL LOW (ref 13.0–17.0)
MCH: 28.9 pg (ref 26.0–34.0)
MCHC: 32.6 g/dL (ref 30.0–36.0)
MCV: 88.7 fL (ref 80.0–100.0)
Platelets: 178 K/uL (ref 150–400)
RBC: 3.91 MIL/uL — ABNORMAL LOW (ref 4.22–5.81)
RDW: 13.4 % (ref 11.5–15.5)
WBC: 8.6 K/uL (ref 4.0–10.5)
nRBC: 0 % (ref 0.0–0.2)

## 2024-05-29 LAB — COMPREHENSIVE METABOLIC PANEL WITH GFR
ALT: 12 U/L (ref 0–44)
AST: 18 U/L (ref 15–41)
Albumin: 3.2 g/dL — ABNORMAL LOW (ref 3.5–5.0)
Alkaline Phosphatase: 87 U/L (ref 38–126)
Anion gap: 8 (ref 5–15)
BUN: 14 mg/dL (ref 8–23)
CO2: 26 mmol/L (ref 22–32)
Calcium: 9.5 mg/dL (ref 8.9–10.3)
Chloride: 102 mmol/L (ref 98–111)
Creatinine, Ser: 1.29 mg/dL — ABNORMAL HIGH (ref 0.61–1.24)
GFR, Estimated: 53 mL/min — ABNORMAL LOW (ref >60)
Glucose, Bld: 122 mg/dL — ABNORMAL HIGH (ref 70–99)
Potassium: 4.1 mmol/L (ref 3.5–5.1)
Sodium: 136 mmol/L (ref 135–145)
Total Bilirubin: 0.9 mg/dL (ref 0.0–1.2)
Total Protein: 6.1 g/dL — ABNORMAL LOW (ref 6.5–8.1)

## 2024-05-29 LAB — PROTIME-INR
INR: 2.2 — ABNORMAL HIGH (ref 0.8–1.2)
Prothrombin Time: 25.3 s — ABNORMAL HIGH (ref 11.4–15.2)

## 2024-05-29 MED ORDER — ACETAMINOPHEN 325 MG PO TABS: 650.0000 mg | ORAL_TABLET | Freq: Four times a day (QID) | ORAL | Status: DC | PRN

## 2024-05-29 MED ORDER — IOHEXOL 350 MG/ML SOLN
50.0000 mL | Freq: Once | INTRAVENOUS | Status: AC | PRN
  Administered 2024-05-29: 50 mL via INTRAVENOUS

## 2024-05-29 NOTE — Progress Notes (Signed)
 Progress Note     Subjective: Patient reports no pain. Patient tolerating CLD without nausea or vomiting after intake. Patient reports loose bowel movement this morning without hematochezia. Reports flatulence.  ROS  All negative with the exception of above.  Objective: Vital signs in last 24 hours: Temp:  [98 F (36.7 C)-98.4 F (36.9 C)] 98 F (36.7 C) (08/18 0443) Pulse Rate:  [66-91] 83 (08/18 0443) Resp:  [14-19] 16 (08/18 0443) BP: (136-167)/(54-84) 137/54 (08/18 0443) SpO2:  [92 %-98 %] 98 % (08/18 0443) Weight:  [68.9 kg] 68.9 kg (08/17 1727)    Intake/Output from previous day: No intake/output data recorded. Intake/Output this shift: No intake/output data recorded.  PE: General: Pleasant, WD, male who is laying in bed in NAD. Lungs: Respiratory effort nonlabored Abd: Soft, NT, ND; Small mass palpable to midline epigastric area from known hernia.  Psych: A&Ox3 with an appropriate affect.    Lab Results:  Recent Labs    05/28/24 1813 05/29/24 0253  WBC 12.6* 8.6  HGB 12.6* 11.3*  HCT 38.6* 34.7*  PLT 204 178   BMET Recent Labs    05/28/24 1813 05/29/24 0253  NA 136 136  K 4.4 4.1  CL 99 102  CO2 26 26  GLUCOSE 139* 122*  BUN 15 14  CREATININE 1.35* 1.29*  CALCIUM  9.9 9.5   PT/INR Recent Labs    05/28/24 1813 05/29/24 0253  LABPROT 26.2* 25.3*  INR 2.3* 2.2*   CMP     Component Value Date/Time   NA 136 05/29/2024 0253   NA 140 04/16/2023 1220   K 4.1 05/29/2024 0253   CL 102 05/29/2024 0253   CO2 26 05/29/2024 0253   GLUCOSE 122 (H) 05/29/2024 0253   BUN 14 05/29/2024 0253   BUN 17 04/16/2023 1220   CREATININE 1.29 (H) 05/29/2024 0253   CALCIUM  9.5 05/29/2024 0253   PROT 6.1 (L) 05/29/2024 0253   PROT 7.0 04/16/2023 1220   ALBUMIN  3.2 (L) 05/29/2024 0253   ALBUMIN  4.4 04/16/2023 1220   AST 18 05/29/2024 0253   ALT 12 05/29/2024 0253   ALKPHOS 87 05/29/2024 0253   BILITOT 0.9 05/29/2024 0253   BILITOT 0.5 04/16/2023 1220    GFRNONAA 53 (L) 05/29/2024 0253   GFRAA 62 09/20/2019 1403   Lipase     Component Value Date/Time   LIPASE 40 05/28/2024 1813       Studies/Results: CT CHEST W CONTRAST Result Date: 05/29/2024 CLINICAL DATA:  Pulmonary nodule on x-ray. EXAM: CT CHEST WITH CONTRAST TECHNIQUE: Multidetector CT imaging of the chest was performed during intravenous contrast administration. RADIATION DOSE REDUCTION: This exam was performed according to the departmental dose-optimization program which includes automated exposure control, adjustment of the mA and/or kV according to patient size and/or use of iterative reconstruction technique. CONTRAST:  50mL OMNIPAQUE  IOHEXOL  350 MG/ML SOLN COMPARISON:  Chest CT 05/05/2023 FINDINGS: Cardiovascular: The heart is enlarged. No substantial pericardial effusion. Status post aortic and mitral valve replacement. Moderate atherosclerotic calcification is noted in the wall of the thoracic aorta. Enlargement of the pulmonary outflow tract/main pulmonary arteries suggests pulmonary arterial hypertension. Mediastinum/Nodes: No mediastinal lymphadenopathy. There is no hilar lymphadenopathy. Small hiatal hernia. The esophagus has normal imaging features. There is no axillary lymphadenopathy. Lungs/Pleura: Centrilobular and paraseptal emphysema evident. Calcified granuloma noted posterior left upper lobe and posterior right lower lobe. 1.8 x 1.4 cm irregular nodule is identified in the right middle lobe (image 97/series 4) new in the interval since the prior  study. Bandlike area of consolidative opacity in the posterior right lower lobe is progressive in the interval, presumably atelectatic although pneumonia is not excluded. Interval progression of volume loss in the paraspinal left lower lobe with bandlike atelectasis or scarring posteriorly in the left lower lobe. No substantial pleural effusion. Upper Abdomen: 12 mm low-density lesion inferior right liver cannot be definitively  characterized. 5.9 cm exophytic water density lesion upper pole right kidney is compatible with a simple cyst. No followup imaging is recommended. Cystic lesion in the interpolar left kidney has some apparent internal septation. Musculoskeletal: No worrisome lytic or sclerotic osseous abnormality. Fixation hardware noted anterior right fourth rib. IMPRESSION: 1. 1.8 x 1.4 cm irregular nodule in the right middle lobe, new in the interval since the prior chest CT. Imaging features are concerning for neoplasm. 2. Bandlike area of consolidative opacity in the posterior right lower lobe is progressive in the interval, presumably atelectatic although pneumonia is not excluded. 3. Interval progression of volume loss in the paraspinal left lower lobe with bandlike atelectasis or scarring posteriorly in the left lower lobe. 4. Enlargement of the pulmonary outflow tract/main pulmonary arteries suggests pulmonary arterial hypertension. 5. 12 mm low-density lesion inferior right liver cannot be definitively characterized. Attention on follow-up recommended. 6. Cystic lesion in the interpolar left kidney has some apparent internal septation. See report for recent abdomen/pelvis CT performed yesterday. 7. Aortic Atherosclerosis (ICD10-I70.0) and Emphysema (ICD10-J43.9). Electronically Signed   By: Camellia Candle M.D.   On: 05/29/2024 06:25   CT ABDOMEN PELVIS W CONTRAST Result Date: 05/28/2024 CLINICAL DATA:  Abdominal wall hernia suspected. Upper abdominal pain rated 10/10. Noticed a mass about a week ago. Palpable mass in upper abdomen in the midline. History of hernia with multiple repairs. EXAM: CT ABDOMEN AND PELVIS WITH CONTRAST TECHNIQUE: Multidetector CT imaging of the abdomen and pelvis was performed using the standard protocol following bolus administration of intravenous contrast. RADIATION DOSE REDUCTION: This exam was performed according to the departmental dose-optimization program which includes automated  exposure control, adjustment of the mA and/or kV according to patient size and/or use of iterative reconstruction technique. CONTRAST:  75mL OMNIPAQUE  IOHEXOL  350 MG/ML SOLN COMPARISON:  CT 07/24/2013 FINDINGS: Lower chest: 1.9 x 1.4 cm irregular nodule in the right middle lobe on series 7, image 21 was not present on 05/05/2023. This is concerning for malignancy and dedicated chest CT is recommended. Cardiomegaly. Hepatobiliary: Unchanged hypoattenuating lesions in the liver compatible with cysts. Cholelithiasis. No evidence of acute cholecystitis. No biliary dilation. Pancreas: Unremarkable. Spleen: Unremarkable. Adrenals/Urinary Tract: Normal adrenal glands. Cortical renal scarring in both kidneys. Nonobstructing left nephrolithiasis no hydronephrosis. Unremarkable bladder. Stomach/Bowel: Small hiatal hernia. The lower esophagus is distended with fluid. Normal appendix. Dilated loops of small bowel throughout the abdomen with abrupt transition point in the low central pelvis immediately distal to a fecalized segment of small bowel (series 3, image 59-64). There is wall thickening and mucosal hyperenhancement of the small bowel immediately distal to the transition point extending to the ileocecal valve. Normal caliber colon without wall thickening. Vascular/Lymphatic: Advanced arterial atherosclerotic calcification including the aorta and its mesenteric, renal, and iliac artery branches. 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 No lymphadenopathy. Reproductive: Enlarged prostate.  Large right hydrocele. Other: Small 0.9 cm fat containing supraumbilical hernia. There is associated stranding in the herniated fat. Correlate for strangulation/incarceration. No free intraperitoneal fluid or air. Musculoskeletal: No acute fracture. IMPRESSION: 1. Small-bowel obstruction with transition point in the  low central pelvis. 2. Inflamed small bowel extends from the transition point to the  ileocecal valve. This may be due to infectious or inflammatory enteritis. Ischemic bowel considered less likely though not excluded given at least moderate narrowing of the celiac axis and SMA due to advanced arterial atherosclerosis. 3. Small 0.9 cm fat containing supraumbilical hernia with associated stranding in the herniated fat. Correlate for strangulation/incarceration. 4. 1.9 cm irregular nodule in the right middle lobe was not present on 05/05/2023. This is concerning for malignancy and dedicated chest CT is recommended. 5. Large right hydrocele. 6. Small hiatal hernia.  Fluid-filled esophagus suggesting reflux. 7. Aortic Atherosclerosis (ICD10-I70.0). Electronically Signed   By: Norman Gatlin M.D.   On: 05/28/2024 19:38    Anti-infectives: Anti-infectives (From admission, onward)    Start     Dose/Rate Route Frequency Ordered Stop   05/29/24 0030  cefTRIAXone  (ROCEPHIN ) 1 g in sodium chloride  0.9 % 100 mL IVPB        1 g 200 mL/hr over 30 Minutes Intravenous Every 24 hours 05/28/24 2344          Assessment/Plan Epigastric hernia -CT Abdomen Pelvis w contrast 05/28/2024 showing small-bowel obstruction with transition point in the low central pelvis. Inflamed small bowel extends from the transition point to the ileocecal valve. This may be due to infectious or inflammatory enteritis. Ischemic bowel considered less likely though not excluded given at least moderate narrowing of the celiac axis and SMA due to advanced arterial atherosclerosis. Small 0.9 cm fat containing supraumbilical hernia with associated stranding in the herniated fat. 1.9 cm irregular nodule in the right middle lobe was not present on 05/05/2023. This is concerning for malignancy (Chest CT recommended). -CT Chest w contrast 8/18 showing 1.8 x 1.4 cm irregular nodule in the right middle lobe concerning for neoplasm. Bandlike area of consolidative opacity in the posterior right lower lobe is progressive in the interval,  presumably atelectatic although pneumonia is not excluded. Interval progression of volume loss in the paraspinal left lower lobe with bandlike atelectasis or scarring posteriorly in the left lower lobe. Enlargement of the pulmonary outflow tract/main pulmonary arteries suggests pulmonary arterial hypertension. 12 mm low-density lesion inferior right liver cannot be definitively characterized. - Afebrile.  - Cr 1.29; Will defer to hospital team for IVF. - WBC 8.6 from 12.6 - Patient without pain and having bowel function with flatulence. Although imaging showed small bowel dilation, patient is not clinically concerning for obstruction. Accordingly will advance diet to FLD as he is tolerating CLD.  -Epigastric hernia noted on imaging. No emergent indication for surgical intervention.  -Continue to hold Coumadin  -Cardiology consult in progress -Will continue to follow.   FEN: Full liquid diet; Will defer IVF to hospital team VTE: Heparin  per pharmacy ID: [email or web address]     LOS: 0 days   I reviewed specialist notes, hospitalist notes, last 24 h vitals and pain scores, last 48 h intake and output, last 24 h labs and trends, and last 24 h imaging results.  This care required moderate level of medical decision making.    Marjorie Carlyon Favre, [facility] 05/29/2024, 8:58 AM Please see Amion for pager number during day hours 7:00am-4:30pm

## 2024-05-29 NOTE — Consult Note (Addendum)
 As below, patient seen and examined.  Briefly he is an 89 year old male with past medical history of bioprosthetic aortic and mitral valve replacements, permanent atrial fibrillation, hypertension, hyperlipidemia, nonobstructive coronary disease admitted with small fat-containing epigastric hernia for preoperative evaluation.  Patient developed abdominal pain Saturday night.  Abdominal CT showed small bowel obstruction with transition point in the low central pelvis, small fat-containing supraumbilical hernia with possible strangulation/incarceration, 1.9 cm right middle lobe lung nodule concerning for malignancy.  Patient may require abdominal surgery and cardiology asked to evaluate preoperatively.  Most recent echocardiogram January 17, 2024 showed normal LV function, mild RV dysfunction, severe biatrial enlargement, status post mitral valve replacement with mean gradient 11 mmHg, mitral valve area 1.2 cm felt consistent with moderate to severe mitral stenosis unchanged, trace mitral regurgitation, status post aortic valve replacement with mean gradient 10 mmHg and no aortic insufficiency.  Note prior to this admission he can walk 2 miles daily with no dyspnea, chest pain, palpitations, syncope.  There is no orthopnea, PND, pedal edema or bleeding.  Laboratory showed creatinine 1.29, potassium 4.1, hemoglobin 11.3 and INR 2.2.  Electrocardiogram is pending.  1 preoperative evaluation prior to abdominal surgery-patient has excellent functional capacity and can walk 2 miles without having dyspnea or chest pain.  Most recent echocardiogram with moderate to severe mitral stenosis but no symptoms.  Patient may proceed with abdominal surgery if necessary without further cardiac evaluation.  Coumadin  is on hold.  Begin IV heparin  when INR less than 2.  Hold 12 hours prior to surgery and resume after when hemostasis achieved. 2 permanent atrial fibrillation-continue metoprolol  and Cardizem  for rate control.  Note this  can be transition to IV Cardizem  postoperatively if patient not taking p.o. medications.  Anticoagulation as outlined above. 3 status post aortic and mitral valve replacement-moderate to severe mitral stenosis noted on most recent echocardiogram.  He is scheduled for follow-up in October. 4 lung nodule-patient has a new lung nodule concerning for malignancy.  Further evaluation per primary care. 5 hypertension-patient's blood pressure is elevated.  Will follow and adjust regimen as needed.        Cardiology Consultation   Patient ID: Jason [name] MRN: [id number]; DOB: 1932/05/14  Admit date: 05/28/2024 Date of Consult: 05/29/2024  PCP:  Jason [name] SAUNDERS, MD   [facility] Providers Cardiologist:  Jason MARLA Red, MD    Patient Profile: Jason [name] is a 89 y.o. male with a hx of status post bioprosthetic AVR in MVR 08/2013, permanent atrial fibrillation, nonobstructive CAD, CKD, hypertension, hyperlipidemia, lung nodule, anemia who is being seen 05/29/2024 for the evaluation of palpitation evaluation at the request of Jason [name].  History of Present Illness: Jason [name] has valvular history with remote MV repair 09/2004.  Eventually had bioprosthetic AVR and MVR in November 2014.  Preoperative left heart catheterization demonstrated nonobstructive CAD.  Gradients for the valves have been stable although last echocardiogram 01/2024 demonstrated moderate to severe stenosis in the mitral valve however this was unchanged based off of echocardiogram June 2024.Jason [name]  Also with known history of atrial fibrillation that seems more permanent.  Followed by EP, known history with pauses, chronically managed on Coumadin .  He was last seen in clinic 01/2024 without any complaints.  Currently patient being evaluated for abdominal hernia with signs of strangulation from fat-containing hernia.  There was initial concerns of SBO however general surgery feels that there is no obstruction.   Cardiology asked to preoperatively evaluate patient.  Pharmacy has already  been consulted to help manage INRs, currently 2.2  Patient today has no acute complaints.  He no longer has any abdominal pain.  Reports that he lives at home alone in takes care of himself completely independently and demonstrates excellent functional capacity.  He goes on 2 mile walks every single day with no exertional symptoms of shortness of breath, chest pain, palpitations, fatigue.  He used to golf quite often however only has discontinued this due to lack of scale.  Otherwise also does not demonstrate any peripheral edema, orthopnea, PND.  Hemoglobin 11.3.  Creatinine 1.2.  Creatinine 1.29.  Lactic acid was normal.   Past Medical History:  Diagnosis Date   AK (actinic keratosis)    Atrial fibrillation, persistent (HCC)    chronic coumadin  therapy   Bifascicular block    Blindness    left eye   CKD (chronic kidney disease), stage III (HCC)    Coronary artery disease    20% RCA by 2014 cath   Elevated PSA    GERD (gastroesophageal reflux disease)    H/O hiatal hernia    Heart murmur    History of kidney stones    Hydrocele    left   Hyperlipidemia    Hypertension    Internal hemorrhoids    Kidney stones    kidney stones   Macular degeneration    right eye   Mitral regurgitation 02/25/2011   Recurrent MR s/p mitral valve repair then bioprosthetic MVR 2014   OA (osteoarthritis) of neck    S/P aortic valve replacement with bioprosthetic valve 08/15/2013   23mm Edwards Magna Ease bovine pericardial tissue valve   S/P mitral valve repair 09/15/2004   Complex valvuloplasty including artificial Goretex neocord placement x4 and 36mm SARP ring annuloplasty via right mini thoracotomy approach - Dr Alford @ [facility]   S/P redo mitral valve replacement and aortic valve replacement with bioprosthetic valves 08/15/2013   27mm Edwards Magna Mitral bovine pericardial tissue valve   Severe aortic stenosis 11/14    tissue AVR    Vocal cord paralysis     Past Surgical History:  Procedure Laterality Date   AORTIC VALVE REPLACEMENT N/A 08/15/2013   Procedure: AORTIC VALVE REPLACEMENT (AVR);  Surgeon: Sudie VEAR Laine, MD;  Location: [facility] OR;  Service: Open Heart Surgery;  Laterality: N/A;   CATARACT EXTRACTION Bilateral 2011   Dr. Camillo   ESOPHAGOGASTRODUODENOSCOPY N/A 08/02/2013   Procedure: ESOPHAGOGASTRODUODENOSCOPY (EGD);  Surgeon: Gordy Reek, MD;  Location: [facility] ENDOSCOPY;  Service: Cardiovascular;  Laterality: N/A;   EYE SURGERY Right    infection   GROIN MASS OPEN BIOPSY  2012   HEMORRHOID SURGERY     pt denies.   INGUINAL HERNIA REPAIR  08/2011   INTRAOPERATIVE TRANSESOPHAGEAL ECHOCARDIOGRAM N/A 08/15/2013   Procedure: INTRAOPERATIVE TRANSESOPHAGEAL ECHOCARDIOGRAM;  Surgeon: Sudie VEAR Laine, MD;  Location: [facility] OR;  Service: Open Heart Surgery;  Laterality: N/A;   LEFT AND RIGHT HEART CATHETERIZATION WITH CORONARY ANGIOGRAM N/A 07/05/2013   Procedure: LEFT AND RIGHT HEART CATHETERIZATION WITH CORONARY ANGIOGRAM;  Surgeon: Candyce GORMAN Reek, MD;  Location: [facility] CATH LAB;  Service: Cardiovascular;  Laterality: N/A;   MASS EXCISION Right 03/30/2018   Procedure: EXCISION RIGHT FLANK  MASS;  Surgeon: Vernetta Berg, MD;  Location: [facility] OR;  Service: General;  Laterality: Right;   MITRAL VALVE REPAIR  09/16/2011   @ [facility], Dr Alford   MITRAL VALVE REPAIR N/A 08/15/2013   Procedure: REDO MITRAL VALVE (MV) REPLACEMENT;  Surgeon: Sudie VEAR Laine,  MD;  Location: [facility] OR;  Service: Open Heart Surgery;  Laterality: N/A;   TEE WITHOUT CARDIOVERSION N/A 07/05/2013   Procedure: TRANSESOPHAGEAL ECHOCARDIOGRAM (TEE);  Surgeon: Gordy Reek, MD;  Location: [facility] ENDOSCOPY;  Service: Cardiovascular;  Laterality: N/A;   TEE WITHOUT CARDIOVERSION N/A 08/02/2013   Procedure: TRANSESOPHAGEAL ECHOCARDIOGRAM (TEE);  Surgeon: Gordy Reek, MD;  Location: [facility] ENDOSCOPY;  Service: Cardiovascular;  Laterality: N/A;   UMBILICAL HERNIA  REPAIR N/A 03/30/2018   Procedure: HERNIA REPAIR UMBILICAL;  Surgeon: Vernetta Berg, MD;  Location: [facility] OR;  Service: General;  Laterality: N/A;    Scheduled Meds:  atorvastatin   20 mg Oral QHS   brimonidine   1 drop Both Eyes BID   diltiazem   120 mg Oral Daily   lisinopril   10 mg Oral Daily   metoprolol  succinate  12.5 mg Oral Daily   Continuous Infusions:  cefTRIAXone  (ROCEPHIN )  IV 1 g (05/29/24 0011)   PRN Meds: acetaminophen , morphine  injection  Allergies:    Allergies  Allergen Reactions   Penicillins Other (See Comments) and Rash    Has patient had a PCN reaction causing immediate rash, facial/tongue/throat swelling, SOB or lightheadedness with hypotension: # # Yes # #  Has patient had a PCN reaction causing severe rash involving mucus membranes or skin necrosis: No  Has patient had a PCN reaction that required hospitalization: No  Has patient had a PCN reaction occurring within the last 10 years: No  If all of the above answers are NO, then may proceed with Cephalosporin use.  Other Reaction(s): Other (See Comments), Unknown  Has patient had a PCN reaction causing immediate rash, facial/tongue/throat swelling, SOB or lightheadedness with hypotension: # # Yes # # Has patient had a PCN reaction causing severe rash involving mucus membranes or skin necrosis: No Has patient had a PCN reaction that required hospitalization: No Has patient had a PCN reaction occurring within the last 10 years: No If all of the above answers are NO, then may proceed with Cephalosporin use.    Social History:   Social History   Socioeconomic History   Marital status: Widowed    Spouse name: Not on file   Number of children: 0   Years of education: Not on file   Highest education level: Not on file  Occupational History   Occupation: retired  Tobacco Use   Smoking status: Former    Current packs/day: 0.00    Types: Cigarettes    Start date: 10/12/1970    Quit date: 10/12/1978     Years since quitting: 45.6   Smokeless tobacco: Never  Vaping Use   Vaping status: Never Used  Substance and Sexual Activity   Alcohol use: No   Drug use: No   Sexual activity: Not on file  Other Topics Concern   Not on file  Social History Narrative   Not on file   Social Drivers of Health   Financial Resource Strain: Not on file  Food Insecurity: No Food Insecurity (05/29/2024)   Hunger Vital Sign    Worried About Running Out of Food in the Last Year: Never true    Ran Out of Food in the Last Year: Never true  Transportation Needs: No Transportation Needs (05/29/2024)   PRAPARE - Administrator, Civil Service (Medical): No    Lack of Transportation (Non-Medical): No  Physical Activity: Not on file  Stress: Not on file  Social Connections: Not on file  Intimate Partner Violence: Not At Risk (05/29/2024)  Humiliation, Afraid, Rape, and Kick questionnaire    Fear of Current or Ex-Partner: No    Emotionally Abused: No    Physically Abused: No    Sexually Abused: No    Family History:   Family History  Problem Relation Age of Onset   Cancer Father        colon   Hypertension Father    Diabetes Father    Hyperlipidemia Father    Stroke Father    Hypertension Mother    Diabetes Mother    Hyperlipidemia Mother    Stroke Mother    Heart attack Mother    Hypertension Brother        MI, S/P MVR     ROS:  Please see the history of present illness.  All other ROS reviewed and negative.     Physical Exam/Data: Vitals:   05/28/24 2110 05/28/24 2223 05/29/24 0015 05/29/24 0443  BP:  (!) 167/84 (!) 154/82 (!) 137/54  Pulse:  81 88 83  Resp:  16 16 16   Temp: 98.4 F (36.9 C) 98.4 F (36.9 C) 98.3 F (36.8 C) 98 F (36.7 C)  TempSrc: Temporal  Oral Oral  SpO2:  93% 93% 98%  Weight:      Height:       No intake or output data in the 24 hours ending 05/29/24 0836    05/28/2024    5:27 PM 01/21/2024   11:33 AM 04/23/2023    3:34 PM  Last 3 Weights   Weight (lbs) 152 lb 150 lb 6.4 oz 150 lb  Weight (kg) 68.947 kg 68.221 kg 68.04 kg     Body mass index is 23.11 kg/m.  General:  Well nourished, well developed, in no acute distress HEENT: normal Neck: no JVD Vascular: No carotid bruits; Distal pulses 2+ bilaterally Cardiac: Irregularly irregular, mechanical valve sounds Lungs:  clear to auscultation bilaterally, no wheezing, rhonchi or rales  Abd: soft, nontender, no hepatomegaly  Ext: no edema Musculoskeletal:  No deformities, BUE and BLE strength normal and equal Skin: warm and dry  Neuro:  CNs 2-12 intact, no focal abnormalities noted Psych:  Normal affect   EKG:  The EKG was personally reviewed and demonstrates: Atrial fibrillation heart rate 61.  Bifascicular block with right bundle branch block and left anterior fascicular block.  PVCs. Telemetry:  Telemetry was personally reviewed and demonstrates: Atrial fibrillation heart rates in the 60s.  Pauses noted at most 2 seconds  Relevant CV Studies: Echocardiogram 01/17/2024 1. Left ventricular ejection fraction, by estimation, is 55 to 60%. The  left ventricle has normal function.   2. Right ventricular systolic function is mildly reduced. The right  ventricular size is normal.   3. Left atrial size was severely dilated.   4. Right atrial size was severely dilated.   5. S/p MVR (27 mm Magna bioprosthesis; procedure date 2014). Leaflets  appear thickened. Mean gradient through the valve is 11 mm Hg (HR 64bpm)  MVA (Pt1/2) 1.2 cm2      Overall consistent with moderate to severe stenosis. Unchanged from  echo done in June 2024.Jason [name] The mitral valve has been repaired/replaced.  Trivial mitral valve regurgitation. There is a 27 mm Magna bioprosthetic  valve present in the mitral position.  Procedure Date: 2014.   6. S/p AVR (23 mm Magna valve, prcedure date 2014). Peak and mean  gradients through the valve are 18 and 10 mm Hg AVA (VTI) 1.76 cm2.  Compared to previous echo in 2024.  no significant change . The aortic  valve has been repaired/replaced. Aortic valve  regurgitation is not visualized. There is a 23 mm Magna valve present in  the aortic position. Procedure Date: 2014.   Laboratory Data: High Sensitivity Troponin:  No results for input(s): TROPONINIHS in the last 720 hours.   Chemistry Recent Labs  Lab 05/28/24 1813 05/29/24 0253  NA 136 136  K 4.4 4.1  CL 99 102  CO2 26 26  GLUCOSE 139* 122*  BUN 15 14  CREATININE 1.35* 1.29*  CALCIUM  9.9 9.5  GFRNONAA 50* 53*  ANIONGAP 11 8    Recent Labs  Lab 05/28/24 1813 05/29/24 0253  PROT 7.0 6.1*  ALBUMIN  3.9 3.2*  AST 22 18  ALT 13 12  ALKPHOS 106 87  BILITOT 1.1 0.9   Lipids No results for input(s): CHOL, TRIG, HDL, LABVLDL, LDLCALC, CHOLHDL in the last 168 hours.  Hematology Recent Labs  Lab 05/28/24 1813 05/29/24 0253  WBC 12.6* 8.6  RBC 4.29 3.91*  HGB 12.6* 11.3*  HCT 38.6* 34.7*  MCV 90.0 88.7  MCH 29.4 28.9  MCHC 32.6 32.6  RDW 13.3 13.4  PLT 204 178   Thyroid  No results for input(s): TSH, FREET4 in the last 168 hours.  BNPNo results for input(s): BNP, PROBNP in the last 168 hours.  DDimer No results for input(s): DDIMER in the last 168 hours.  Radiology/Studies:  CT CHEST W CONTRAST Result Date: 05/29/2024 CLINICAL DATA:  Pulmonary nodule on x-ray. EXAM: CT CHEST WITH CONTRAST TECHNIQUE: Multidetector CT imaging of the chest was performed during intravenous contrast administration. RADIATION DOSE REDUCTION: This exam was performed according to the departmental dose-optimization program which includes automated exposure control, adjustment of the mA and/or kV according to patient size and/or use of iterative reconstruction technique. CONTRAST:  50mL OMNIPAQUE  IOHEXOL  350 MG/ML SOLN COMPARISON:  Chest CT 05/05/2023 FINDINGS: Cardiovascular: The heart is enlarged. No substantial pericardial effusion. Status post aortic and mitral valve replacement. Moderate  atherosclerotic calcification is noted in the wall of the thoracic aorta. Enlargement of the pulmonary outflow tract/main pulmonary arteries suggests pulmonary arterial hypertension. Mediastinum/Nodes: No mediastinal lymphadenopathy. There is no hilar lymphadenopathy. Small hiatal hernia. The esophagus has normal imaging features. There is no axillary lymphadenopathy. Lungs/Pleura: Centrilobular and paraseptal emphysema evident. Calcified granuloma noted posterior left upper lobe and posterior right lower lobe. 1.8 x 1.4 cm irregular nodule is identified in the right middle lobe (image 97/series 4) new in the interval since the prior study. Bandlike area of consolidative opacity in the posterior right lower lobe is progressive in the interval, presumably atelectatic although pneumonia is not excluded. Interval progression of volume loss in the paraspinal left lower lobe with bandlike atelectasis or scarring posteriorly in the left lower lobe. No substantial pleural effusion. Upper Abdomen: 12 mm low-density lesion inferior right liver cannot be definitively characterized. 5.9 cm exophytic water density lesion upper pole right kidney is compatible with a simple cyst. No followup imaging is recommended. Cystic lesion in the interpolar left kidney has some apparent internal septation. Musculoskeletal: No worrisome lytic or sclerotic osseous abnormality. Fixation hardware noted anterior right fourth rib. IMPRESSION: 1. 1.8 x 1.4 cm irregular nodule in the right middle lobe, new in the interval since the prior chest CT. Imaging features are concerning for neoplasm. 2. Bandlike area of consolidative opacity in the posterior right lower lobe is progressive in the interval, presumably atelectatic although pneumonia is not excluded. 3. Interval progression of volume loss in the paraspinal  left lower lobe with bandlike atelectasis or scarring posteriorly in the left lower lobe. 4. Enlargement of the pulmonary outflow  tract/main pulmonary arteries suggests pulmonary arterial hypertension. 5. 12 mm low-density lesion inferior right liver cannot be definitively characterized. Attention on follow-up recommended. 6. Cystic lesion in the interpolar left kidney has some apparent internal septation. See report for recent abdomen/pelvis CT performed yesterday. 7. Aortic Atherosclerosis (ICD10-I70.0) and Emphysema (ICD10-J43.9). Electronically Signed   By: Camellia Candle M.D.   On: 05/29/2024 06:25   CT ABDOMEN PELVIS W CONTRAST Result Date: 05/28/2024 CLINICAL DATA:  Abdominal wall hernia suspected. Upper abdominal pain rated 10/10. Noticed a mass about a week ago. Palpable mass in upper abdomen in the midline. History of hernia with multiple repairs. EXAM: CT ABDOMEN AND PELVIS WITH CONTRAST TECHNIQUE: Multidetector CT imaging of the abdomen and pelvis was performed using the standard protocol following bolus administration of intravenous contrast. RADIATION DOSE REDUCTION: This exam was performed according to the departmental dose-optimization program which includes automated exposure control, adjustment of the mA and/or kV according to patient size and/or use of iterative reconstruction technique. CONTRAST:  75mL OMNIPAQUE  IOHEXOL  350 MG/ML SOLN COMPARISON:  CT 07/24/2013 FINDINGS: Lower chest: 1.9 x 1.4 cm irregular nodule in the right middle lobe on series 7, image 21 was not present on 05/05/2023. This is concerning for malignancy and dedicated chest CT is recommended. Cardiomegaly. Hepatobiliary: Unchanged hypoattenuating lesions in the liver compatible with cysts. Cholelithiasis. No evidence of acute cholecystitis. No biliary dilation. Pancreas: Unremarkable. Spleen: Unremarkable. Adrenals/Urinary Tract: Normal adrenal glands. Cortical renal scarring in both kidneys. Nonobstructing left nephrolithiasis no hydronephrosis. Unremarkable bladder. Stomach/Bowel: Small hiatal hernia. The lower esophagus is distended with fluid.  Normal appendix. Dilated loops of small bowel throughout the abdomen with abrupt transition point in the low central pelvis immediately distal to a fecalized segment of small bowel (series 3, image 59-64). There is wall thickening and mucosal hyperenhancement of the small bowel immediately distal to the transition point extending to the ileocecal valve. Normal caliber colon without wall thickening. Vascular/Lymphatic: Advanced arterial atherosclerotic calcification including the aorta and its mesenteric, renal, and iliac artery branches. 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 No lymphadenopathy. Reproductive: Enlarged prostate.  Large right hydrocele. Other: Small 0.9 cm fat containing supraumbilical hernia. There is associated stranding in the herniated fat. Correlate for strangulation/incarceration. No free intraperitoneal fluid or air. Musculoskeletal: No acute fracture. IMPRESSION: 1. Small-bowel obstruction with transition point in the low central pelvis. 2. Inflamed small bowel extends from the transition point to the ileocecal valve. This may be due to infectious or inflammatory enteritis. Ischemic bowel considered less likely though not excluded given at least moderate narrowing of the celiac axis and SMA due to advanced arterial atherosclerosis. 3. Small 0.9 cm fat containing supraumbilical hernia with associated stranding in the herniated fat. Correlate for strangulation/incarceration. 4. 1.9 cm irregular nodule in the right middle lobe was not present on 05/05/2023. This is concerning for malignancy and dedicated chest CT is recommended. 5. Large right hydrocele. 6. Small hiatal hernia.  Fluid-filled esophagus suggesting reflux. 7. Aortic Atherosclerosis (ICD10-I70.0). Electronically Signed   By: Norman Gatlin M.D.   On: 05/28/2024 19:38     Assessment and Plan:  Preoperative evaluation Patient here with concerns of abdominal pain secondary to abdominal hernia  with strangulation from fat-containing hernia.  No obvious planned procedures and currently asymptomatic however patient with history of mitral and aortic valve replacements stable gradients however  echocardiogram 01/2024 demonstrating moderate to severe MS stenosis but unchanged from previous echocardiogram last year.  AVR mean gradient 10.  He has no evidence of CAD and with normal heart cath in 2014.  Demonstrates excellent functional capacity complete greater than 4 METS.  Assuming minimally invasive procedure his RCRI is 0.5%. INRs are being closely monitored by pharmacy, currently 2.2.  They will start heparin  once INR is below 2.  Resume Coumadin  back as able.  Permanent atrial fibrillation He has severe biatrial enlargement.  Fortunately rate controlled and compensated, heart rates in the 60s.  He does have bifascicular block that has been stable with pauses noted on telemetry less than 2 seconds but asymptomatic from this. Resume Coumadin  as able Continue with diltiazem  120 mg and Toprol -XL 12.5 mg.  Valvular disease Status post bioprosthetic MVR/AVR in 2014.  See above.  He has plans for echocardiogram 07/2024. Defer to MD if he feels repeat is necessary prior to procedure. Coumadin  as above, INR goal 2-3.  Hypertension Slightly elevated 140-150. Continue in the context above and lisinopril  10 mg.  Can adjust after procedure if needed.  Risk Assessment/Risk Scores:  CHA2DS2-VASc Score = 3   This indicates a 3.2% annual risk of stroke. The patient's score is based upon: CHF History: 0 HTN History: 1 Diabetes History: 0 Stroke History: 0 Vascular Disease History: 0 Age Score: 2 Gender Score: 0   For questions or updates, please contact [facility] Please consult [email or web address] for contact info under    Signed, Thom LITTIE Sluder, PA-C  05/29/2024 8:36 AM

## 2024-05-29 NOTE — Progress Notes (Signed)
 PROGRESS NOTE    Jason [name]  FMW:[id number] DOB: [birth year] DOA: 05/28/2024 PCP: Arloa [name] SAUNDERS, MD   Brief Narrative:   89 y.o. 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 CT abdomen pelvis was done shows small bowel obstruction with transition point in the low central pelvis with inflamed small bowel extending from the transition point to the ileocecal wall. May be due to infectious or inflammatory enteritis. Ischemic bowel considered less likely but not excluded. Given the moderate narrowing of the celiac axis and SMA. Small 0.9 cm fat-containing supraumbilical hernia with associated stranding in the herniated fat. General surgery consulted and no urgent surgical interventions needed. Cardiology on board for preop clearance. Holding coumadin  and will start heparin  drip once INR is less than 2.  Assessment & Plan:  Principal Problem:   SBO (small bowel obstruction) (HCC) Active Problems:   Atrial fibrillation, persistent (HCC)   Chronic anticoagulation   HTN (hypertension)   Pure hypercholesterolemia   S/P redo mitral valve replacement and aortic valve replacement with bioprosthetic valves Aug 15 2013   S/P aortic valve replacement with bioprosthetic valve   ARF (acute renal failure) (HCC)   CKD (chronic kidney disease), stage III (HCC)   Anemia   Abdominal pain/Epigastric hernia -    appreciate general surgery consult.   General surgery at this time feels patient's abdominal pain could be from the strangulation of the fat-containing hernia.  There is small bowel dilatation but patient clinically does not have obstruction per surgery.   General surgery would consider repair of the  hernia if it continues to cause pain.   On a full liquid diet. Pain improving.  Lung nodule concerning for malignancy,POA:    CT chest w contrast showed evidence of right middle lobe irregular nodule concerning for neoplasm. Out patient follow up with Pulm.  Permanent A-fib: presently rate controlled on Cardizem  and metoprolol .  Holding coumadin  Start heparin  drip once INR is less than 2. Pharmacy consulted.  History of bioprosthetic mitral valve replacement with moderate to severe stenosis and history of aortic valve replacement.   Cardiology on board. Out patient follow up.  Hypertension: on lisinopril , Cardizem  and metoprolol .  Recent toe infection: on ceftriaxone .  Used to take Keflex.  Chronic kidney disease stage III: creatinine at around baseline.  Normocytic Anemia likely from renal disease follow CBC.  Hyperlipidemia: on statins.  Disposition: Home. IADL.   DVT prophylaxis: INR is greater than 2. Will be started on heparin  drip once INR is less than 2.     Code Status: Full Code Family Communication:  None at the bedside Status is: Observation The patient remains OBS appropriate and will d/c before 2 midnights.    Subjective:  Denies abdominal pain this morning. No vomiting. He is having loose Bowel movements. He lives at home by himself and is IADL.   Examination:  General exam: Appears calm and comfortable  Respiratory system: Clear to auscultation. Respiratory effort normal. Cardiovascular system: S1 & S2 heard, RRR. No JVD, murmurs, rubs, gallops or clicks. No pedal edema. Gastrointestinal system: Abdomen is nondistended, soft and nontender. No organomegaly or masses felt. Normal bowel sounds heard. Central nervous system: Alert and oriented. No focal neurological deficits. Extremities: Symmetric 5 x 5 power. Skin: No rashes, lesions or ulcers  Psychiatry: Judgement and insight appear normal. Mood & affect appropriate.       Diet Orders (From  admission, onward)     Start     Ordered   05/29/24 1050  Diet full liquid Room service appropriate? Yes; Fluid consistency: Thin  Diet effective now       Question Answer Comment  Room service appropriate? Yes   Fluid consistency: Thin      05/29/24 1049            Objective: Vitals:   05/28/24 2223 05/29/24 0015 05/29/24 0443 05/29/24 0831  BP: (!) 167/84 (!) 154/82 (!) 137/54 (!) 145/78  Pulse: 81 88 83 81  Resp: 16 16 16    Temp: 98.4 F (36.9 C) 98.3 F (36.8 C) 98 F (36.7 C) 97.8 F (36.6 C)  TempSrc:  Oral Oral Oral  SpO2: 93% 93% 98% 94%  Weight:      Height:       No intake or output data in the 24 hours ending 05/29/24 1121 Filed Weights   05/28/24 1727  Weight: 68.9 kg    Scheduled Meds:  atorvastatin   20 mg Oral QHS   brimonidine   1 drop Both Eyes BID   diltiazem   120 mg Oral Daily   lisinopril   10 mg Oral Daily   metoprolol  succinate  12.5 mg Oral Daily   Continuous Infusions:  cefTRIAXone  (ROCEPHIN )  IV 1 g (05/29/24 0011)    Nutritional status     Body mass index is 23.11 kg/m.  Data Reviewed:   CBC: Recent Labs  Lab 05/28/24 1813 05/29/24 0253  WBC 12.6* 8.6  NEUTROABS 11.2*  --   HGB 12.6* 11.3*  HCT 38.6* 34.7*  MCV 90.0 88.7  PLT 204 178   Basic Metabolic Panel: Recent Labs  Lab 05/28/24 1813 05/29/24 0253  NA 136 136  K 4.4 4.1  CL 99 102  CO2 26 26  GLUCOSE 139* 122*  BUN 15 14  CREATININE 1.35* 1.29*  CALCIUM  9.9 9.5   GFR: Estimated Creatinine Clearance: 37.6 mL/min (A) (by C-G formula based on SCr of 1.29 mg/dL (H)). Liver Function Tests: Recent Labs  Lab 05/28/24 1813 05/29/24 0253  AST 22 18  ALT 13 12  ALKPHOS 106 87  BILITOT 1.1 0.9  PROT 7.0 6.1*  ALBUMIN  3.9 3.2*   Recent Labs  Lab 05/28/24 1813  LIPASE 40   No results for input(s): AMMONIA in the last 168 hours. Coagulation Profile: Recent Labs  Lab 05/28/24 1813 05/29/24 0253  INR 2.3* 2.2*   Cardiac Enzymes: No results for  input(s): CKTOTAL, CKMB, CKMBINDEX, TROPONINI in the last 168 hours. BNP (last 3 results) No results for input(s): PROBNP in the last 8760 hours. HbA1C: No results for input(s): HGBA1C in the last 72 hours. CBG: No results for input(s): GLUCAP in the last 168 hours. Lipid Profile: No results for input(s): CHOL, HDL, LDLCALC, TRIG, CHOLHDL, LDLDIRECT in the last 72 hours. Thyroid  Function Tests: No results for input(s): TSH, T4TOTAL, FREET4, T3FREE, THYROIDAB in the last 72 hours. Anemia Panel: No results for input(s): VITAMINB12, FOLATE, FERRITIN, TIBC, IRON, RETICCTPCT in the last 72 hours. Sepsis Labs: Recent Labs  Lab 05/28/24 1820  LATICACIDVEN 1.1    No results found for this or any previous visit (from the past 240 hours).       Radiology Studies: CT CHEST W CONTRAST Result Date: 05/29/2024 CLINICAL DATA:  Pulmonary nodule on x-ray. EXAM: CT CHEST WITH CONTRAST TECHNIQUE: Multidetector  CT imaging of the chest was performed during intravenous contrast administration. RADIATION DOSE REDUCTION: This exam was performed according to the departmental dose-optimization program which includes automated exposure control, adjustment of the mA and/or kV according to patient size and/or use of iterative reconstruction technique. CONTRAST:  50mL OMNIPAQUE  IOHEXOL  350 MG/ML SOLN COMPARISON:  Chest CT 05/05/2023 FINDINGS: Cardiovascular: The heart is enlarged. No substantial pericardial effusion. Status post aortic and mitral valve replacement. Moderate atherosclerotic calcification is noted in the wall of the thoracic aorta. Enlargement of the pulmonary outflow tract/main pulmonary arteries suggests pulmonary arterial hypertension. Mediastinum/Nodes: No mediastinal lymphadenopathy. There is no hilar lymphadenopathy. Small hiatal hernia. The esophagus has normal imaging features. There is no axillary lymphadenopathy. Lungs/Pleura: Centrilobular and  paraseptal emphysema evident. Calcified granuloma noted posterior left upper lobe and posterior right lower lobe. 1.8 x 1.4 cm irregular nodule is identified in the right middle lobe (image 97/series 4) new in the interval since the prior study. Bandlike area of consolidative opacity in the posterior right lower lobe is progressive in the interval, presumably atelectatic although pneumonia is not excluded. Interval progression of volume loss in the paraspinal left lower lobe with bandlike atelectasis or scarring posteriorly in the left lower lobe. No substantial pleural effusion. Upper Abdomen: 12 mm low-density lesion inferior right liver cannot be definitively characterized. 5.9 cm exophytic water density lesion upper pole right kidney is compatible with a simple cyst. No followup imaging is recommended. Cystic lesion in the interpolar left kidney has some apparent internal septation. Musculoskeletal: No worrisome lytic or sclerotic osseous abnormality. Fixation hardware noted anterior right fourth rib. IMPRESSION: 1. 1.8 x 1.4 cm irregular nodule in the right middle lobe, new in the interval since the prior chest CT. Imaging features are concerning for neoplasm. 2. Bandlike area of consolidative opacity in the posterior right lower lobe is progressive in the interval, presumably atelectatic although pneumonia is not excluded. 3. Interval progression of volume loss in the paraspinal left lower lobe with bandlike atelectasis or scarring posteriorly in the left lower lobe. 4. Enlargement of the pulmonary outflow tract/main pulmonary arteries suggests pulmonary arterial hypertension. 5. 12 mm low-density lesion inferior right liver cannot be definitively characterized. Attention on follow-up recommended. 6. Cystic lesion in the interpolar left kidney has some apparent internal septation. See report for recent abdomen/pelvis CT performed yesterday. 7. Aortic Atherosclerosis (ICD10-I70.0) and Emphysema (ICD10-J43.9).  Electronically Signed   By: Camellia Candle M.D.   On: 05/29/2024 06:25   CT ABDOMEN PELVIS W CONTRAST Result Date: 05/28/2024 CLINICAL DATA:  Abdominal wall hernia suspected. Upper abdominal pain rated 10/10. Noticed a mass about a week ago. Palpable mass in upper abdomen in the midline. History of hernia with multiple repairs. EXAM: CT ABDOMEN AND PELVIS WITH CONTRAST TECHNIQUE: Multidetector CT imaging of the abdomen and pelvis was performed using the standard protocol following bolus administration of intravenous contrast. RADIATION DOSE REDUCTION: This exam was performed according to the departmental dose-optimization program which includes automated exposure control, adjustment of the mA and/or kV according to patient size and/or use of iterative reconstruction technique. CONTRAST:  75mL OMNIPAQUE  IOHEXOL  350 MG/ML SOLN COMPARISON:  CT 07/24/2013 FINDINGS: Lower chest: 1.9 x 1.4 cm irregular nodule in the right middle lobe on series 7, image 21 was not present on 05/05/2023. This is concerning for malignancy and dedicated chest CT is recommended. Cardiomegaly. Hepatobiliary: Unchanged hypoattenuating lesions in the liver compatible with cysts. Cholelithiasis. No evidence of acute cholecystitis. No biliary dilation. Pancreas: Unremarkable. Spleen: Unremarkable. Adrenals/Urinary Tract:  Normal adrenal glands. Cortical renal scarring in both kidneys. Nonobstructing left nephrolithiasis no hydronephrosis. Unremarkable bladder. Stomach/Bowel: Small hiatal hernia. The lower esophagus is distended with fluid. Normal appendix. Dilated loops of small bowel throughout the abdomen with abrupt transition point in the low central pelvis immediately distal to a fecalized segment of small bowel (series 3, image 59-64). There is wall thickening and mucosal hyperenhancement of the small bowel immediately distal to the transition point extending to the ileocecal valve. Normal caliber colon without wall thickening.  Vascular/Lymphatic: Advanced arterial atherosclerotic calcification including the aorta and its mesenteric, renal, and iliac artery branches. 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 No lymphadenopathy. Reproductive: Enlarged prostate.  Large right hydrocele. Other: Small 0.9 cm fat containing supraumbilical hernia. There is associated stranding in the herniated fat. Correlate for strangulation/incarceration. No free intraperitoneal fluid or air. Musculoskeletal: No acute fracture. IMPRESSION: 1. Small-bowel obstruction with transition point in the low central pelvis. 2. Inflamed small bowel extends from the transition point to the ileocecal valve. This may be due to infectious or inflammatory enteritis. Ischemic bowel considered less likely though not excluded given at least moderate narrowing of the celiac axis and SMA due to advanced arterial atherosclerosis. 3. Small 0.9 cm fat containing supraumbilical hernia with associated stranding in the herniated fat. Correlate for strangulation/incarceration. 4. 1.9 cm irregular nodule in the right middle lobe was not present on 05/05/2023. This is concerning for malignancy and dedicated chest CT is recommended. 5. Large right hydrocele. 6. Small hiatal hernia.  Fluid-filled esophagus suggesting reflux. 7. Aortic Atherosclerosis (ICD10-I70.0). Electronically Signed   By: Norman Gatlin M.D.   On: 05/28/2024 19:38       LOS: 0 days   Time spent= 40 mins    Deliliah Room, MD [facility]  If 7PM-7AM, please contact night-coverage  05/29/2024, 11:21 AM

## 2024-05-29 NOTE — Plan of Care (Signed)

## 2024-05-29 NOTE — Care Management Obs Status (Signed)
 MEDICARE OBSERVATION STATUS NOTIFICATION   Patient Details  Name: Jason [name] MRN: [id number] Date of Birth: 1932/09/28   Medicare Observation Status Notification Given:  Yes  Verbally reviewed observation notice with Tod Fair telephonically at [phone]. Will mail a copy to the patiant home address  Claretta Deed 05/29/2024, 2:59 PM

## 2024-05-30 DIAGNOSIS — K439 Ventral hernia without obstruction or gangrene: Secondary | ICD-10-CM | POA: Diagnosis not present

## 2024-05-30 DIAGNOSIS — K56609 Unspecified intestinal obstruction, unspecified as to partial versus complete obstruction: Secondary | ICD-10-CM | POA: Diagnosis not present

## 2024-05-30 LAB — HEPARIN LEVEL (UNFRACTIONATED)
Heparin Unfractionated: <0.1 [IU]/mL — ABNORMAL LOW (ref 0.30–0.70)
Heparin Unfractionated: <0.1 [IU]/mL — ABNORMAL LOW (ref 0.30–0.70)

## 2024-05-30 LAB — PROTIME-INR
INR: 1.6 — ABNORMAL HIGH (ref 0.8–1.2)
Prothrombin Time: 20.4 s — ABNORMAL HIGH (ref 11.4–15.2)

## 2024-05-30 MED ORDER — HEPARIN BOLUS VIA INFUSION
1000.0000 [IU] | Freq: Once | INTRAVENOUS | Status: AC
  Administered 2024-05-30: 1000 [IU] via INTRAVENOUS
  Filled 2024-05-30: qty 1000

## 2024-05-30 MED ORDER — HEPARIN (PORCINE) 25000 UT/250ML-% IV SOLN
1350.0000 [IU]/h | INTRAVENOUS | Status: DC
  Administered 2024-05-30: 1000 [IU]/h via INTRAVENOUS
  Filled 2024-05-30: qty 250

## 2024-05-30 NOTE — Progress Notes (Signed)
 PROGRESS NOTE    Jason [name]  FMW:[id number] DOB: 1932-05-03 DOA: 05/28/2024 PCP: Arloa [name] SAUNDERS, MD   Brief Narrative:   89 y.o. 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 CT abdomen pelvis was done shows small bowel obstruction with transition point in the low central pelvis with inflamed small bowel extending from the transition point to the ileocecal wall. May be due to infectious or inflammatory enteritis. Ischemic bowel considered less likely but not excluded. Given the moderate narrowing of the celiac axis and SMA. Small 0.9 cm fat-containing supraumbilical hernia with associated stranding in the herniated fat. General surgery consulted and no urgent surgical interventions needed. Cardiology on board for preop clearance. Holding coumadin  and will start heparin  drip today. No urgent surgical indications.  Assessment & Plan:  Principal Problem:   SBO (small bowel obstruction) (HCC) Active Problems:   Atrial fibrillation, persistent (HCC)   Chronic anticoagulation   HTN (hypertension)   Pure hypercholesterolemia   S/P redo mitral valve replacement and aortic valve replacement with bioprosthetic valves Aug 15 2013   S/P aortic valve replacement with bioprosthetic valve   ARF (acute renal failure) (HCC)   CKD (chronic kidney disease), stage III (HCC)   Anemia   Abdominal pain/Epigastric hernia -    appreciate general surgery consult.   General surgery at this time feels patient's abdominal pain could be from the strangulation of the fat-containing hernia.  There is small bowel dilatation but patient clinically does not have obstruction per surgery.   No urgent indications for  surgery Advanced diet to a soft diet today. Pain improving.  Lung nodule concerning for malignancy,POA:    CT chest w contrast showed evidence of right middle lobe irregular nodule concerning for neoplasm. Out patient follow up with Pulm.  Permanent A-fib: presently rate controlled on Cardizem  and metoprolol .  Holding coumadin  Will start heparin  drip as INR Is less than 2. Pharmacy consulted.  History of bioprosthetic mitral valve replacement with moderate to severe stenosis and history of aortic valve replacement.   Cardiology on board. Out patient follow up.  Hypertension: on lisinopril , Cardizem  and metoprolol .  Recent toe infection: on ceftriaxone .  Used to take Keflex.  Chronic kidney disease stage III: creatinine at around baseline.  Normocytic Anemia likely from renal disease follow CBC.  Hyperlipidemia: on statins.  Disposition: Home. IADL.   DVT prophylaxis: will start heparin  drip today.     Code Status: Full Code Family Communication:  None at the bedside Status is: Observation The patient remains OBS appropriate and will d/c before 2 midnights.    Subjective:  No abdominal pain. Diet will be advanced to a soft diet today.    Examination:  General exam: Appears calm and comfortable  Respiratory system: Clear to auscultation. Respiratory effort normal. Cardiovascular system: S1 & S2 heard, RRR. No JVD, murmurs, rubs, gallops or clicks. No pedal edema. Gastrointestinal system: Abdomen is nondistended, soft and nontender. No organomegaly or masses felt. Normal bowel sounds heard. Central nervous system: Alert and oriented. No focal neurological deficits. Extremities: Symmetric 5 x 5 power. Skin: No rashes, lesions or ulcers Psychiatry: Judgement and insight appear normal. Mood & affect appropriate.       Diet Orders (From admission, onward)     Start  Ordered   05/30/24 0918  DIET SOFT Room service appropriate? Yes; Fluid consistency: Thin  Diet  effective now       Question Answer Comment  Room service appropriate? Yes   Fluid consistency: Thin      05/30/24 0917            Objective: Vitals:   05/29/24 2032 05/29/24 2330 05/30/24 0520 05/30/24 0915  BP: (!) 141/55 125/64 (!) 156/44 (!) 162/64  Pulse: (!) 55 75 (!) 57 61  Resp: 18   17  Temp: 97.9 F (36.6 C)   97.8 F (36.6 C)  TempSrc:    Oral  SpO2: 95% 95% 95% 97%  Weight:      Height:        Intake/Output Summary (Last 24 hours) at 05/30/2024 0918 Last data filed at 05/30/2024 0914 Gross per 24 hour  Intake 200 ml  Output --  Net 200 ml   Filed Weights   05/28/24 1727  Weight: 68.9 kg    Scheduled Meds:  atorvastatin   20 mg Oral QHS   brimonidine   1 drop Both Eyes BID   diltiazem   120 mg Oral Daily   lisinopril   10 mg Oral Daily   metoprolol  succinate  12.5 mg Oral Daily   Continuous Infusions:  cefTRIAXone  (ROCEPHIN )  IV Stopped (05/29/24 2357)   heparin       Nutritional status     Body mass index is 23.11 kg/m.  Data Reviewed:   CBC: Recent Labs  Lab 05/28/24 1813 05/29/24 0253  WBC 12.6* 8.6  NEUTROABS 11.2*  --   HGB 12.6* 11.3*  HCT 38.6* 34.7*  MCV 90.0 88.7  PLT 204 178   Basic Metabolic Panel: Recent Labs  Lab 05/28/24 1813 05/29/24 0253  NA 136 136  K 4.4 4.1  CL 99 102  CO2 26 26  GLUCOSE 139* 122*  BUN 15 14  CREATININE 1.35* 1.29*  CALCIUM  9.9 9.5   GFR: Estimated Creatinine Clearance: 37.6 mL/min (A) (by C-G formula based on SCr of 1.29 mg/dL (H)). Liver Function Tests: Recent Labs  Lab 05/28/24 1813 05/29/24 0253  AST 22 18  ALT 13 12  ALKPHOS 106 87  BILITOT 1.1 0.9  PROT 7.0 6.1*  ALBUMIN  3.9 3.2*   Recent Labs  Lab 05/28/24 1813  LIPASE 40   No results for input(s): AMMONIA in the last 168 hours. Coagulation Profile: Recent Labs  Lab 05/28/24 1813 05/29/24 0253 05/30/24 0435  INR 2.3* 2.2* 1.6*   Cardiac Enzymes: No results for input(s): CKTOTAL, CKMB, CKMBINDEX,  TROPONINI in the last 168 hours. BNP (last 3 results) No results for input(s): PROBNP in the last 8760 hours. HbA1C: No results for input(s): HGBA1C in the last 72 hours. CBG: No results for input(s): GLUCAP in the last 168 hours. Lipid Profile: No results for input(s): CHOL, HDL, LDLCALC, TRIG, CHOLHDL, LDLDIRECT in the last 72 hours. Thyroid  Function Tests: No results for input(s): TSH, T4TOTAL, FREET4, T3FREE, THYROIDAB in the last 72 hours. Anemia Panel: No results for input(s): VITAMINB12, FOLATE, FERRITIN, TIBC, IRON, RETICCTPCT in the last 72 hours. Sepsis Labs: Recent Labs  Lab 05/28/24 1820  LATICACIDVEN 1.1    No results found for this or any previous visit (from the past 240 hours).       Radiology Studies: CT CHEST W CONTRAST Result Date: 05/29/2024 CLINICAL DATA:  Pulmonary nodule on x-ray. EXAM: CT CHEST WITH CONTRAST TECHNIQUE: Multidetector CT imaging of the chest was performed during intravenous  contrast administration. RADIATION DOSE REDUCTION: This exam was performed according to the departmental dose-optimization program which includes automated exposure control, adjustment of the mA and/or kV according to patient size and/or use of iterative reconstruction technique. CONTRAST:  50mL OMNIPAQUE  IOHEXOL  350 MG/ML SOLN COMPARISON:  Chest CT 05/05/2023 FINDINGS: Cardiovascular: The heart is enlarged. No substantial pericardial effusion. Status post aortic and mitral valve replacement. Moderate atherosclerotic calcification is noted in the wall of the thoracic aorta. Enlargement of the pulmonary outflow tract/main pulmonary arteries suggests pulmonary arterial hypertension. Mediastinum/Nodes: No mediastinal lymphadenopathy. There is no hilar lymphadenopathy. Small hiatal hernia. The esophagus has normal imaging features. There is no axillary lymphadenopathy. Lungs/Pleura: Centrilobular and paraseptal emphysema evident. Calcified  granuloma noted posterior left upper lobe and posterior right lower lobe. 1.8 x 1.4 cm irregular nodule is identified in the right middle lobe (image 97/series 4) new in the interval since the prior study. Bandlike area of consolidative opacity in the posterior right lower lobe is progressive in the interval, presumably atelectatic although pneumonia is not excluded. Interval progression of volume loss in the paraspinal left lower lobe with bandlike atelectasis or scarring posteriorly in the left lower lobe. No substantial pleural effusion. Upper Abdomen: 12 mm low-density lesion inferior right liver cannot be definitively characterized. 5.9 cm exophytic water density lesion upper pole right kidney is compatible with a simple cyst. No followup imaging is recommended. Cystic lesion in the interpolar left kidney has some apparent internal septation. Musculoskeletal: No worrisome lytic or sclerotic osseous abnormality. Fixation hardware noted anterior right fourth rib. IMPRESSION: 1. 1.8 x 1.4 cm irregular nodule in the right middle lobe, new in the interval since the prior chest CT. Imaging features are concerning for neoplasm. 2. Bandlike area of consolidative opacity in the posterior right lower lobe is progressive in the interval, presumably atelectatic although pneumonia is not excluded. 3. Interval progression of volume loss in the paraspinal left lower lobe with bandlike atelectasis or scarring posteriorly in the left lower lobe. 4. Enlargement of the pulmonary outflow tract/main pulmonary arteries suggests pulmonary arterial hypertension. 5. 12 mm low-density lesion inferior right liver cannot be definitively characterized. Attention on follow-up recommended. 6. Cystic lesion in the interpolar left kidney has some apparent internal septation. See report for recent abdomen/pelvis CT performed yesterday. 7. Aortic Atherosclerosis (ICD10-I70.0) and Emphysema (ICD10-J43.9). Electronically Signed   By: Camellia Candle  M.D.   On: 05/29/2024 06:25   CT ABDOMEN PELVIS W CONTRAST Result Date: 05/28/2024 CLINICAL DATA:  Abdominal wall hernia suspected. Upper abdominal pain rated 10/10. Noticed a mass about a week ago. Palpable mass in upper abdomen in the midline. History of hernia with multiple repairs. EXAM: CT ABDOMEN AND PELVIS WITH CONTRAST TECHNIQUE: Multidetector CT imaging of the abdomen and pelvis was performed using the standard protocol following bolus administration of intravenous contrast. RADIATION DOSE REDUCTION: This exam was performed according to the departmental dose-optimization program which includes automated exposure control, adjustment of the mA and/or kV according to patient size and/or use of iterative reconstruction technique. CONTRAST:  75mL OMNIPAQUE  IOHEXOL  350 MG/ML SOLN COMPARISON:  CT 07/24/2013 FINDINGS: Lower chest: 1.9 x 1.4 cm irregular nodule in the right middle lobe on series 7, image 21 was not present on 05/05/2023. This is concerning for malignancy and dedicated chest CT is recommended. Cardiomegaly. Hepatobiliary: Unchanged hypoattenuating lesions in the liver compatible with cysts. Cholelithiasis. No evidence of acute cholecystitis. No biliary dilation. Pancreas: Unremarkable. Spleen: Unremarkable. Adrenals/Urinary Tract: Normal adrenal glands. Cortical renal scarring in both kidneys.  Nonobstructing left nephrolithiasis no hydronephrosis. Unremarkable bladder. Stomach/Bowel: Small hiatal hernia. The lower esophagus is distended with fluid. Normal appendix. Dilated loops of small bowel throughout the abdomen with abrupt transition point in the low central pelvis immediately distal to a fecalized segment of small bowel (series 3, image 59-64). There is wall thickening and mucosal hyperenhancement of the small bowel immediately distal to the transition point extending to the ileocecal valve. Normal caliber colon without wall thickening. Vascular/Lymphatic: Advanced arterial atherosclerotic  calcification including the aorta and its mesenteric, renal, and iliac artery branches. 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 No lymphadenopathy. Reproductive: Enlarged prostate.  Large right hydrocele. Other: Small 0.9 cm fat containing supraumbilical hernia. There is associated stranding in the herniated fat. Correlate for strangulation/incarceration. No free intraperitoneal fluid or air. Musculoskeletal: No acute fracture. IMPRESSION: 1. Small-bowel obstruction with transition point in the low central pelvis. 2. Inflamed small bowel extends from the transition point to the ileocecal valve. This may be due to infectious or inflammatory enteritis. Ischemic bowel considered less likely though not excluded given at least moderate narrowing of the celiac axis and SMA due to advanced arterial atherosclerosis. 3. Small 0.9 cm fat containing supraumbilical hernia with associated stranding in the herniated fat. Correlate for strangulation/incarceration. 4. 1.9 cm irregular nodule in the right middle lobe was not present on 05/05/2023. This is concerning for malignancy and dedicated chest CT is recommended. 5. Large right hydrocele. 6. Small hiatal hernia.  Fluid-filled esophagus suggesting reflux. 7. Aortic Atherosclerosis (ICD10-I70.0). Electronically Signed   By: Norman Gatlin M.D.   On: 05/28/2024 19:38       LOS: 0 days   Time spent= 40 mins    Deliliah Room, MD [facility]  If 7PM-7AM, please contact night-coverage  05/30/2024, 9:18 AM

## 2024-05-30 NOTE — Progress Notes (Signed)
 PHARMACY - ANTICOAGULATION CONSULT NOTE  Pharmacy Consult for warfarin >> heparin  Indication: atrial fibrillation  Patient Measurements: Height: 5' 8 (172.7 cm) Weight: 68.9 kg (152 lb) IBW/kg (Calculated) : 68.4 HEPARIN  DW (KG): 68.9  Vital Signs: Temp: 97.9 F (36.6 C) (08/18 2032) BP: 156/44 (08/19 0520) Pulse Rate: 57 (08/19 0520)  Labs: Recent Labs    05/28/24 1813 05/29/24 0253 05/30/24 0435  HGB 12.6* 11.3*  --   HCT 38.6* 34.7*  --   PLT 204 178  --   LABPROT 26.2* 25.3* 20.4*  INR 2.3* 2.2* 1.6*  CREATININE 1.35* 1.29*  --     Estimated Creatinine Clearance: 37.6 mL/min (A) (by C-G formula based on SCr of 1.29 mg/dL (H)).   Medical History: Past Medical History:  Diagnosis Date   AK (actinic keratosis)    Atrial fibrillation, persistent (HCC)    chronic coumadin  therapy   Bifascicular block    Blindness    left eye   CKD (chronic kidney disease), stage III (HCC)    Coronary artery disease    20% RCA by 2014 cath   Elevated PSA    GERD (gastroesophageal reflux disease)    H/O hiatal hernia    Heart murmur    History of kidney stones    Hydrocele    left   Hyperlipidemia    Hypertension    Internal hemorrhoids    Kidney stones    kidney stones   Macular degeneration    right eye   Mitral regurgitation 02/25/2011   Recurrent MR s/p mitral valve repair then bioprosthetic MVR 2014   OA (osteoarthritis) of neck    S/P aortic valve replacement with bioprosthetic valve 08/15/2013   23mm Edwards Magna Ease bovine pericardial tissue valve   S/P mitral valve repair 09/15/2004   Complex valvuloplasty including artificial Goretex neocord placement x4 and 36mm SARP ring annuloplasty via right mini thoracotomy approach - Dr Alford @ [facility]   S/P redo mitral valve replacement and aortic valve replacement with bioprosthetic valves 08/15/2013   27mm Edwards Magna Mitral bovine pericardial tissue valve   Severe aortic stenosis 11/14   tissue AVR    Vocal cord  paralysis     Medications:  -Warfarin 7.5mg  PO every Tuesday; 5mg  PO all other days (Last dose: 8/17)  Assessment: 62 yoM presented to ED with pain at site of a known hernia. PMH includes history of afib, bAVR, bMVR, and HTN. Pharmacy consulted to dose heparin  for afib.  8/19 AM: INR 1.6, below goal of 2-3 and appropriate to start heparin . Per [facility] note, no plans for operative intervention at this time unless deemed to be absolutely necessary.   Goal of Therapy:  INR 2-3 Heparin  level 0.3-0.7 units/ml Monitor platelets by anticoagulation protocol: Yes   Plan:  Start heparin  infusion at 1000 units/hr  Check anti-Xa level in 8 hours and daily while on heparin  Continue to monitor H&H and platelets Follow up if needs repair of hernia if continues to have pain  Follow up transition back to Coumadin    Massie Fila, PharmD Clinical Pharmacist  05/30/2024 7:59 AM

## 2024-05-30 NOTE — Plan of Care (Signed)
   Problem: Activity: Goal: Risk for activity intolerance will decrease Outcome: Progressing   Problem: Nutrition: Goal: Adequate nutrition will be maintained Outcome: Progressing   Problem: Elimination: Goal: Will not experience complications related to bowel motility Outcome: Progressing   Problem: Skin Integrity: Goal: Risk for impaired skin integrity will decrease Outcome: Progressing

## 2024-05-30 NOTE — Progress Notes (Signed)
 PHARMACY - ANTICOAGULATION CONSULT NOTE  Pharmacy Consult for warfarin >> heparin  Indication: atrial fibrillation  Patient Measurements: Height: 5' 8 (172.7 cm) Weight: 68.9 kg (152 lb) IBW/kg (Calculated) : 68.4 HEPARIN  DW (KG): 68.9  Vital Signs: Temp: 98 F (36.7 C) (08/19 1731) Temp Source: Oral (08/19 0915) BP: 150/74 (08/19 1731) Pulse Rate: 52 (08/19 1731)  Labs: Recent Labs    05/28/24 1813 05/29/24 0253 05/30/24 0435 05/30/24 1821  HGB 12.6* 11.3*  --   --   HCT 38.6* 34.7*  --   --   PLT 204 178  --   --   LABPROT 26.2* 25.3* 20.4*  --   INR 2.3* 2.2* 1.6*  --   HEPARINUNFRC  --   --   --  <0.10*  CREATININE 1.35* 1.29*  --   --     Estimated Creatinine Clearance: 37.6 mL/min (A) (by C-G formula based on SCr of 1.29 mg/dL (H)).   Medical History: Past Medical History:  Diagnosis Date   AK (actinic keratosis)    Atrial fibrillation, persistent (HCC)    chronic coumadin  therapy   Bifascicular block    Blindness    left eye   CKD (chronic kidney disease), stage III (HCC)    Coronary artery disease    20% RCA by 2014 cath   Elevated PSA    GERD (gastroesophageal reflux disease)    H/O hiatal hernia    Heart murmur    History of kidney stones    Hydrocele    left   Hyperlipidemia    Hypertension    Internal hemorrhoids    Kidney stones    kidney stones   Macular degeneration    right eye   Mitral regurgitation 02/25/2011   Recurrent MR s/p mitral valve repair then bioprosthetic MVR 2014   OA (osteoarthritis) of neck    S/P aortic valve replacement with bioprosthetic valve 08/15/2013   23mm Edwards Magna Ease bovine pericardial tissue valve   S/P mitral valve repair 09/15/2004   Complex valvuloplasty including artificial Goretex neocord placement x4 and 36mm SARP ring annuloplasty via right mini thoracotomy approach - Dr Alford @ [facility]   S/P redo mitral valve replacement and aortic valve replacement with bioprosthetic valves 08/15/2013   27mm  Edwards Magna Mitral bovine pericardial tissue valve   Severe aortic stenosis 11/14   tissue AVR    Vocal cord paralysis     Medications:  -Warfarin 7.5mg  PO every Tuesday; 5mg  PO all other days (Last dose: 8/17)  Assessment: 38 yoM presented to ED with pain at site of a known hernia. PMH includes history of afib, bAVR, bMVR, and HTN. Pharmacy consulted to dose heparin  for afib.  8/19 AM: INR 1.6, below goal of 2-3 and appropriate to start heparin . Per [facility] note, no plans for operative intervention at this time unless deemed to be absolutely necessary.   PM: HL < 0.1, per RN - infusion running appropriately at 1000 units/hr. No bleeding reported.  Goal of Therapy:  INR 2-3 Heparin  level 0.3-0.7 units/ml Monitor platelets by anticoagulation protocol: Yes   Plan:  Give heparin  1000 units IV bolus x 1 Increase heparin  infusion at 1150 units/hr  Check anti-Xa level in 8 hours and daily while on heparin  Continue to monitor H&H and platelets Follow up if needs repair of hernia if continues to have pain  Follow up transition back to Coumadin    Rocky Slade, PharmD, BCPS Clinical Pharmacist  05/30/2024 7:44 PM   Please check AMION  for all [facility] Pharmacy phone numbers After 10:00 PM, call Main Pharmacy [phone]

## 2024-05-30 NOTE — Progress Notes (Signed)
 Progress Note     Subjective: Patient denies pain or any new concerns. Reports that he is tolerating FLD. Has not tried soft diet yet. Last bowel movement was yesterday. No hematochezia. No BM today yet. Reports flatulence. Denies nausea and vomiting.   ROS  All negative with the exception of above.  Objective: Vital signs in last 24 hours: Temp:  [97.7 F (36.5 C)-97.9 F (36.6 C)] 97.8 F (36.6 C) (08/19 0915) Pulse Rate:  [49-78] 61 (08/19 0915) Resp:  [15-19] 17 (08/19 0915) BP: (125-162)/(44-77) 162/64 (08/19 0915) SpO2:  [92 %-97 %] 97 % (08/19 0915)    Intake/Output from previous day: No intake/output data recorded. Intake/Output this shift: Total I/O In: 200 [IV Piggyback:200] Out: -   PE: General: Pleasant, WD, male who is laying in bed in NAD. Lungs: Respiratory effort nonlabored Abd: Soft, NT, ND; Small mass palpable to midline epigastric area from known hernia.  Psych: A&Ox3 with an appropriate affect.   Lab Results:  Recent Labs    05/28/24 1813 05/29/24 0253  WBC 12.6* 8.6  HGB 12.6* 11.3*  HCT 38.6* 34.7*  PLT 204 178   BMET Recent Labs    05/28/24 1813 05/29/24 0253  NA 136 136  K 4.4 4.1  CL 99 102  CO2 26 26  GLUCOSE 139* 122*  BUN 15 14  CREATININE 1.35* 1.29*  CALCIUM  9.9 9.5   PT/INR Recent Labs    05/29/24 0253 05/30/24 0435  LABPROT 25.3* 20.4*  INR 2.2* 1.6*   CMP     Component Value Date/Time   NA 136 05/29/2024 0253   NA 140 04/16/2023 1220   K 4.1 05/29/2024 0253   CL 102 05/29/2024 0253   CO2 26 05/29/2024 0253   GLUCOSE 122 (H) 05/29/2024 0253   BUN 14 05/29/2024 0253   BUN 17 04/16/2023 1220   CREATININE 1.29 (H) 05/29/2024 0253   CALCIUM  9.5 05/29/2024 0253   PROT 6.1 (L) 05/29/2024 0253   PROT 7.0 04/16/2023 1220   ALBUMIN  3.2 (L) 05/29/2024 0253   ALBUMIN  4.4 04/16/2023 1220   AST 18 05/29/2024 0253   ALT 12 05/29/2024 0253   ALKPHOS 87 05/29/2024 0253   BILITOT 0.9 05/29/2024 0253   BILITOT  0.5 04/16/2023 1220   GFRNONAA 53 (L) 05/29/2024 0253   GFRAA 62 09/20/2019 1403   Lipase     Component Value Date/Time   LIPASE 40 05/28/2024 1813       Studies/Results: CT CHEST W CONTRAST Result Date: 05/29/2024 CLINICAL DATA:  Pulmonary nodule on x-ray. EXAM: CT CHEST WITH CONTRAST TECHNIQUE: Multidetector CT imaging of the chest was performed during intravenous contrast administration. RADIATION DOSE REDUCTION: This exam was performed according to the departmental dose-optimization program which includes automated exposure control, adjustment of the mA and/or kV according to patient size and/or use of iterative reconstruction technique. CONTRAST:  50mL OMNIPAQUE  IOHEXOL  350 MG/ML SOLN COMPARISON:  Chest CT 05/05/2023 FINDINGS: Cardiovascular: The heart is enlarged. No substantial pericardial effusion. Status post aortic and mitral valve replacement. Moderate atherosclerotic calcification is noted in the wall of the thoracic aorta. Enlargement of the pulmonary outflow tract/main pulmonary arteries suggests pulmonary arterial hypertension. Mediastinum/Nodes: No mediastinal lymphadenopathy. There is no hilar lymphadenopathy. Small hiatal hernia. The esophagus has normal imaging features. There is no axillary lymphadenopathy. Lungs/Pleura: Centrilobular and paraseptal emphysema evident. Calcified granuloma noted posterior left upper lobe and posterior right lower lobe. 1.8 x 1.4 cm irregular nodule is identified in the right middle lobe (image  97/series 4) new in the interval since the prior study. Bandlike area of consolidative opacity in the posterior right lower lobe is progressive in the interval, presumably atelectatic although pneumonia is not excluded. Interval progression of volume loss in the paraspinal left lower lobe with bandlike atelectasis or scarring posteriorly in the left lower lobe. No substantial pleural effusion. Upper Abdomen: 12 mm low-density lesion inferior right liver cannot  be definitively characterized. 5.9 cm exophytic water density lesion upper pole right kidney is compatible with a simple cyst. No followup imaging is recommended. Cystic lesion in the interpolar left kidney has some apparent internal septation. Musculoskeletal: No worrisome lytic or sclerotic osseous abnormality. Fixation hardware noted anterior right fourth rib. IMPRESSION: 1. 1.8 x 1.4 cm irregular nodule in the right middle lobe, new in the interval since the prior chest CT. Imaging features are concerning for neoplasm. 2. Bandlike area of consolidative opacity in the posterior right lower lobe is progressive in the interval, presumably atelectatic although pneumonia is not excluded. 3. Interval progression of volume loss in the paraspinal left lower lobe with bandlike atelectasis or scarring posteriorly in the left lower lobe. 4. Enlargement of the pulmonary outflow tract/main pulmonary arteries suggests pulmonary arterial hypertension. 5. 12 mm low-density lesion inferior right liver cannot be definitively characterized. Attention on follow-up recommended. 6. Cystic lesion in the interpolar left kidney has some apparent internal septation. See report for recent abdomen/pelvis CT performed yesterday. 7. Aortic Atherosclerosis (ICD10-I70.0) and Emphysema (ICD10-J43.9). Electronically Signed   By: Camellia Candle M.D.   On: 05/29/2024 06:25   CT ABDOMEN PELVIS W CONTRAST Result Date: 05/28/2024 CLINICAL DATA:  Abdominal wall hernia suspected. Upper abdominal pain rated 10/10. Noticed a mass about a week ago. Palpable mass in upper abdomen in the midline. History of hernia with multiple repairs. EXAM: CT ABDOMEN AND PELVIS WITH CONTRAST TECHNIQUE: Multidetector CT imaging of the abdomen and pelvis was performed using the standard protocol following bolus administration of intravenous contrast. RADIATION DOSE REDUCTION: This exam was performed according to the departmental dose-optimization program which includes  automated exposure control, adjustment of the mA and/or kV according to patient size and/or use of iterative reconstruction technique. CONTRAST:  75mL OMNIPAQUE  IOHEXOL  350 MG/ML SOLN COMPARISON:  CT 07/24/2013 FINDINGS: Lower chest: 1.9 x 1.4 cm irregular nodule in the right middle lobe on series 7, image 21 was not present on 05/05/2023. This is concerning for malignancy and dedicated chest CT is recommended. Cardiomegaly. Hepatobiliary: Unchanged hypoattenuating lesions in the liver compatible with cysts. Cholelithiasis. No evidence of acute cholecystitis. No biliary dilation. Pancreas: Unremarkable. Spleen: Unremarkable. Adrenals/Urinary Tract: Normal adrenal glands. Cortical renal scarring in both kidneys. Nonobstructing left nephrolithiasis no hydronephrosis. Unremarkable bladder. Stomach/Bowel: Small hiatal hernia. The lower esophagus is distended with fluid. Normal appendix. Dilated loops of small bowel throughout the abdomen with abrupt transition point in the low central pelvis immediately distal to a fecalized segment of small bowel (series 3, image 59-64). There is wall thickening and mucosal hyperenhancement of the small bowel immediately distal to the transition point extending to the ileocecal valve. Normal caliber colon without wall thickening. Vascular/Lymphatic: Advanced arterial atherosclerotic calcification including the aorta and its mesenteric, renal, and iliac artery branches. 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 No lymphadenopathy. Reproductive: Enlarged prostate.  Large right hydrocele. Other: Small 0.9 cm fat containing supraumbilical hernia. There is associated stranding in the herniated fat. Correlate for strangulation/incarceration. No free intraperitoneal fluid or air. Musculoskeletal: No acute fracture.  IMPRESSION: 1. Small-bowel obstruction with transition point in the low central pelvis. 2. Inflamed small bowel extends from the transition point to  the ileocecal valve. This may be due to infectious or inflammatory enteritis. Ischemic bowel considered less likely though not excluded given at least moderate narrowing of the celiac axis and SMA due to advanced arterial atherosclerosis. 3. Small 0.9 cm fat containing supraumbilical hernia with associated stranding in the herniated fat. Correlate for strangulation/incarceration. 4. 1.9 cm irregular nodule in the right middle lobe was not present on 05/05/2023. This is concerning for malignancy and dedicated chest CT is recommended. 5. Large right hydrocele. 6. Small hiatal hernia.  Fluid-filled esophagus suggesting reflux. 7. Aortic Atherosclerosis (ICD10-I70.0). Electronically Signed   By: Norman Gatlin M.D.   On: 05/28/2024 19:38    Anti-infectives: Anti-infectives (From admission, onward)    Start     Dose/Rate Route Frequency Ordered Stop   05/29/24 0030  cefTRIAXone  (ROCEPHIN ) 1 g in sodium chloride  0.9 % 100 mL IVPB        1 g 200 mL/hr over 30 Minutes Intravenous Every 24 hours 05/28/24 2344          Assessment/Plan Epigastric hernia -CT Abdomen Pelvis w contrast 05/28/2024 showing small-bowel obstruction with transition point in the low central pelvis. Inflamed small bowel extends from the transition point to the ileocecal valve. This may be due to infectious or inflammatory enteritis. Ischemic bowel considered less likely though not excluded given at least moderate narrowing of the celiac axis and SMA due to advanced arterial atherosclerosis. Small 0.9 cm fat containing supraumbilical hernia with associated stranding in the herniated fat. 1.9 cm irregular nodule in the right middle lobe was not present on 05/05/2023. This is concerning for malignancy (Chest CT recommended). -CT Chest w contrast 8/18 showing 1.8 x 1.4 cm irregular nodule in the right middle lobe concerning for neoplasm. Bandlike area of consolidative opacity in the posterior right lower lobe is progressive in the  interval, presumably atelectatic although pneumonia is not excluded. Interval progression of volume loss in the paraspinal left lower lobe with bandlike atelectasis or scarring posteriorly in the left lower lobe. Enlargement of the pulmonary outflow tract/main pulmonary arteries suggests pulmonary arterial hypertension. 12 mm low-density lesion inferior right liver cannot be definitively characterized. - Afebrile. Hypertensive. - WBC 8.6 on 8/18; Labs pending. - No pain and having bowel function with flatulence. Continues to not be concerning for obstruction clinically. Patient to trial soft diet.   -Epigastric hernia noted on imaging. No emergent indication for surgical intervention. Cardiology did clear him for surgery. However, patient does not desire surgery for this. -Continue to hold Coumadin  -Will continue to follow.     FEN: Soft; Will defer IVF to hospital team VTE: Heparin  per pharmacy ID: [email or web address]     LOS: 0 days   I reviewed specialist notes, hospitalist notes, last 24 h vitals and pain scores, last 48 h intake and output, last 24 h labs and trends, and last 24 h imaging results.  This care required moderate level of medical decision making.    Marjorie Carlyon Favre, [facility] 05/30/2024, 9:19 AM Please see Amion for pager number during day hours 7:00am-4:30pm

## 2024-05-31 ENCOUNTER — Ambulatory Visit

## 2024-05-31 ENCOUNTER — Other Ambulatory Visit (HOSPITAL_COMMUNITY): Payer: Self-pay

## 2024-05-31 DIAGNOSIS — K439 Ventral hernia without obstruction or gangrene: Secondary | ICD-10-CM | POA: Diagnosis not present

## 2024-05-31 DIAGNOSIS — K56609 Unspecified intestinal obstruction, unspecified as to partial versus complete obstruction: Secondary | ICD-10-CM | POA: Diagnosis not present

## 2024-05-31 LAB — CBC
HCT: 33.4 % — ABNORMAL LOW (ref 39.0–52.0)
Hemoglobin: 11.3 g/dL — ABNORMAL LOW (ref 13.0–17.0)
MCH: 29.6 pg (ref 26.0–34.0)
MCHC: 33.8 g/dL (ref 30.0–36.0)
MCV: 87.4 fL (ref 80.0–100.0)
Platelets: 173 K/uL (ref 150–400)
RBC: 3.82 MIL/uL — ABNORMAL LOW (ref 4.22–5.81)
RDW: 13.2 % (ref 11.5–15.5)
WBC: 8.1 K/uL (ref 4.0–10.5)
nRBC: 0 % (ref 0.0–0.2)

## 2024-05-31 LAB — PROTIME-INR
INR: 1.3 — ABNORMAL HIGH (ref 0.8–1.2)
Prothrombin Time: 17 s — ABNORMAL HIGH (ref 11.4–15.2)

## 2024-05-31 LAB — HEPARIN LEVEL (UNFRACTIONATED): Heparin Unfractionated: 0.11 [IU]/mL — ABNORMAL LOW (ref 0.30–0.70)

## 2024-05-31 MED ORDER — ENOXAPARIN SODIUM 100 MG/ML IJ SOSY
100.0000 mg | PREFILLED_SYRINGE | INTRAMUSCULAR | Status: DC
  Administered 2024-05-31: 100 mg via SUBCUTANEOUS
  Filled 2024-05-31: qty 1

## 2024-05-31 MED ORDER — WARFARIN - PHARMACIST DOSING INPATIENT: Freq: Every day | Status: DC

## 2024-05-31 MED ORDER — WARFARIN SODIUM 7.5 MG PO TABS
7.5000 mg | ORAL_TABLET | Freq: Once | ORAL | Status: DC
  Filled 2024-05-31: qty 1

## 2024-05-31 MED ORDER — ENOXAPARIN (LOVENOX) PATIENT EDUCATION KIT
PACK | Freq: Once | Status: AC
  Filled 2024-05-31: qty 1

## 2024-05-31 MED ORDER — HEPARIN BOLUS VIA INFUSION
1000.0000 [IU] | Freq: Once | INTRAVENOUS | Status: AC
  Administered 2024-05-31: 1000 [IU] via INTRAVENOUS
  Filled 2024-05-31: qty 1000

## 2024-05-31 MED ORDER — ENOXAPARIN SODIUM 100 MG/ML IJ SOSY: 100.0000 mg | PREFILLED_SYRINGE | INTRAMUSCULAR | 0 refills | Status: DC

## 2024-05-31 MED ORDER — ENOXAPARIN SODIUM 100 MG/ML IJ SOSY
100.0000 mg | PREFILLED_SYRINGE | INTRAMUSCULAR | 0 refills | Status: DC
Start: 2024-05-31 — End: 2024-06-26
  Filled 2024-05-31: qty 7, 7d supply, fill #0

## 2024-05-31 NOTE — Progress Notes (Addendum)
 PHARMACY - ANTICOAGULATION CONSULT NOTE  Pharmacy Consult for warfarin >> heparin  Indication: atrial fibrillation  Patient Measurements: Height: 5' 8 (172.7 cm) Weight: 68.9 kg (152 lb) IBW/kg (Calculated) : 68.4 HEPARIN  DW (KG): 68.9  Vital Signs: Temp: 97.7 F (36.5 C) (08/20 0428) BP: 157/66 (08/20 0428) Pulse Rate: 57 (08/20 0428)  Labs: Recent Labs    05/28/24 1813 05/29/24 0253 05/30/24 0435 05/30/24 1821 05/30/24 1932 05/31/24 0254  HGB 12.6* 11.3*  --   --   --  11.3*  HCT 38.6* 34.7*  --   --   --  33.4*  PLT 204 178  --   --   --  173  LABPROT 26.2* 25.3* 20.4*  --   --  17.0*  INR 2.3* 2.2* 1.6*  --   --  1.3*  HEPARINUNFRC  --   --   --  <0.10* <0.10* 0.11*  CREATININE 1.35* 1.29*  --   --   --   --     Estimated Creatinine Clearance: 37.6 mL/min (A) (by C-G formula based on SCr of 1.29 mg/dL (H)).   Medical History: Past Medical History:  Diagnosis Date   AK (actinic keratosis)    Atrial fibrillation, persistent (HCC)    chronic coumadin  therapy   Bifascicular block    Blindness    left eye   CKD (chronic kidney disease), stage III (HCC)    Coronary artery disease    20% RCA by 2014 cath   Elevated PSA    GERD (gastroesophageal reflux disease)    H/O hiatal hernia    Heart murmur    History of kidney stones    Hydrocele    left   Hyperlipidemia    Hypertension    Internal hemorrhoids    Kidney stones    kidney stones   Macular degeneration    right eye   Mitral regurgitation 02/25/2011   Recurrent MR s/p mitral valve repair then bioprosthetic MVR 2014   OA (osteoarthritis) of neck    S/P aortic valve replacement with bioprosthetic valve 08/15/2013   23mm Edwards Magna Ease bovine pericardial tissue valve   S/P mitral valve repair 09/15/2004   Complex valvuloplasty including artificial Goretex neocord placement x4 and 36mm SARP ring annuloplasty via right mini thoracotomy approach - Dr Alford @ [facility]   S/P redo mitral valve replacement  and aortic valve replacement with bioprosthetic valves 08/15/2013   27mm Edwards Magna Mitral bovine pericardial tissue valve   Severe aortic stenosis 11/14   tissue AVR    Vocal cord paralysis     Medications:  -Warfarin 7.5mg  PO every Tuesday; 5mg  PO all other days (Last dose: 8/17)  Assessment: 48 yoM presented to ED with pain at site of a known hernia. PMH includes history of afib, bAVR, bMVR, and HTN. Pharmacy consulted to dose heparin  for afib.  8/19 AM: INR 1.6, below goal of 2-3 and appropriate to start heparin . Per [facility] note, no plans for operative intervention at this time unless deemed to be absolutely necessary.   PM: HL < 0.1, per RN - infusion running appropriately at 1000 units/hr. No bleeding reported.  8/20 AM - HL 0.11 (~7h after rate change) on 1150 units/hr.  No interruptions/infusion issues per RN.   INR 1.3, CBC stable (Hgb 11.3, pltc 163).   Goal of Therapy:  INR 2-3 Heparin  level 0.3-0.7 units/ml Monitor platelets by anticoagulation protocol: Yes   Plan:  Heparin  1000 units IV bolus x 1 Increase heparin  infusion at  1350 units/hr  8h heparin  level Continue to monitor H&H and platelets Follow up if needs repair of hernia if continues to have pain  Follow up transition back to Coumadin  - cleared by surgery 8/19  ADDENDUM: Pharmacy consulted to resume warfarin. INR below goal at 1.3 this AM. Will resume dosing per PTA regimen (to target same total weekly dose) for now without standing doses.    - add warfarin 7.5 mg x1   ADDENDUM: Now consulted for daily lovenox  dosing for patient discharge while attempting to obtain therapeutic INR.   - Add lovenox  100 mg Pingree daily   Massie Fila, PharmD Clinical Pharmacist  05/31/2024 7:49 AM

## 2024-05-31 NOTE — Progress Notes (Signed)
 Transition of Care [facility]) - Inpatient Brief Assessment   Patient Details  Name: Jason [name] MRN: [id number] Date of Birth: [birth year]  [facility] CM[facility]SW Contact:    Rosaline JONELLE Joe, RN Phone Number: 05/31/2024, 12:43 PM   Clinical Narrative: CM met with the patient at the bedside to discuss IP Care needs for home.  Patient admitted to the hospital for Epigastric hernia.  Patient will be discharged home on Lovenox  injections - bedside nursing will teach the patient Injection technique at the bedside today before discharging to home.   Patient lives alone and will need transportation assistance to home today once discharge.  Discharge lounge likely to provide taxi voucher.  DME at the home includes BP monitor.  PCP - Dr. [name] Lesches.  Patient was offered Medicare choice regarding home health and patient did not have a preference.  I called Hedda CHEADLE and Darleene, RNCM with [employer] approved for services.  HH orders for RN placed to be co-signed by MD.  No other IP Care management needs at this time.   Transition of Care Asessment: Insurance and Status: (P) Insurance coverage has been reviewed Patient has primary care physician: (P) Yes Home environment has been reviewed: (P) from home alone Prior level of function:: (P) self Prior/Current Home Services: (P) No current home services (electronic BP monitor at home) Social Drivers of Health Review: (P) SDOH reviewed interventions complete Readmission risk has been reviewed: (P) Yes Transition of care needs: (P) transition of care needs identified, TOC will continue to follow

## 2024-05-31 NOTE — Progress Notes (Signed)
 Progress Note     Subjective: Patient denies new concerns. Denies abdominal pain. He is tolerating soft diet without nausea or vomiting. Patient reports bowel movements. Denies bloody stool. Reports flatulence.  ROS  All negative with the exception of above.  Objective: Vital signs in last 24 hours: Temp:  [97.7 F (36.5 C)-98.2 F (36.8 C)] 98 F (36.7 C) (08/20 0813) Pulse Rate:  [51-66] 60 (08/20 0813) Resp:  [16-18] 17 (08/20 0813) BP: (148-164)/(53-78) 161/60 (08/20 0813) SpO2:  [93 %-98 %] 97 % (08/20 0813)    Intake/Output from previous day: 08/19 0701 - 08/20 0700 In: 200 [IV Piggyback:200] Out: -  Intake/Output this shift: No intake/output data recorded.  PE: General: Pleasant, WD, male who is laying in bed in NAD. Lungs: Respiratory effort nonlabored Abd: Soft, NT, ND; Small mass palpable to midline epigastric area from known hernia.  Psych: A&Ox3 with an appropriate affect.    Lab Results:  Recent Labs    05/29/24 0253 05/31/24 0254  WBC 8.6 8.1  HGB 11.3* 11.3*  HCT 34.7* 33.4*  PLT 178 173   BMET Recent Labs    05/28/24 1813 05/29/24 0253  NA 136 136  K 4.4 4.1  CL 99 102  CO2 26 26  GLUCOSE 139* 122*  BUN 15 14  CREATININE 1.35* 1.29*  CALCIUM  9.9 9.5   PT/INR Recent Labs    05/30/24 0435 05/31/24 0254  LABPROT 20.4* 17.0*  INR 1.6* 1.3*   CMP     Component Value Date/Time   NA 136 05/29/2024 0253   NA 140 04/16/2023 1220   K 4.1 05/29/2024 0253   CL 102 05/29/2024 0253   CO2 26 05/29/2024 0253   GLUCOSE 122 (H) 05/29/2024 0253   BUN 14 05/29/2024 0253   BUN 17 04/16/2023 1220   CREATININE 1.29 (H) 05/29/2024 0253   CALCIUM  9.5 05/29/2024 0253   PROT 6.1 (L) 05/29/2024 0253   PROT 7.0 04/16/2023 1220   ALBUMIN  3.2 (L) 05/29/2024 0253   ALBUMIN  4.4 04/16/2023 1220   AST 18 05/29/2024 0253   ALT 12 05/29/2024 0253   ALKPHOS 87 05/29/2024 0253   BILITOT 0.9 05/29/2024 0253   BILITOT 0.5 04/16/2023 1220   GFRNONAA  53 (L) 05/29/2024 0253   GFRAA 62 09/20/2019 1403   Lipase     Component Value Date/Time   LIPASE 40 05/28/2024 1813       Studies/Results: No results found.  Anti-infectives: Anti-infectives (From admission, onward)    Start     Dose/Rate Route Frequency Ordered Stop   05/29/24 0030  cefTRIAXone  (ROCEPHIN ) 1 g in sodium chloride  0.9 % 100 mL IVPB        1 g 200 mL/hr over 30 Minutes Intravenous Every 24 hours 05/28/24 2344          Assessment/Plan Epigastric hernia -CT Abdomen Pelvis w contrast 05/28/2024 showing small-bowel obstruction with transition point in the low central pelvis. Inflamed small bowel extends from the transition point to the ileocecal valve. This may be due to infectious or inflammatory enteritis. Ischemic bowel considered less likely though not excluded given at least moderate narrowing of the celiac axis and SMA due to advanced arterial atherosclerosis. Small 0.9 cm fat containing supraumbilical hernia with associated stranding in the herniated fat. 1.9 cm irregular nodule in the right middle lobe was not present on 05/05/2023. This is concerning for malignancy (Chest CT recommended). -CT Chest w contrast 8/18 showing 1.8 x 1.4 cm irregular nodule in the  right middle lobe concerning for neoplasm. Bandlike area of consolidative opacity in the posterior right lower lobe is progressive in the interval, presumably atelectatic although pneumonia is not excluded. Interval progression of volume loss in the paraspinal left lower lobe with bandlike atelectasis or scarring posteriorly in the left lower lobe. Enlargement of the pulmonary outflow tract/main pulmonary arteries suggests pulmonary arterial hypertension. 12 mm low-density lesion inferior right liver cannot be definitively characterized. - Afebrile. Hypertensive. - WBC 8.1 - No pain and having bowel function with flatulence. Continues to not be concerning for obstruction clinically. Tolerating soft diet. Can  advance as tolerated. -Epigastric hernia noted on imaging. No emergent indication for surgical intervention. Cardiology did clear him for surgery. However, patient does not desire surgery for this. -Continue resume coumadin  -Stable for discharge once medically cleared. Please call for additional questions or concerns.     FEN: Soft and can advance as tolerated to regular; Will defer IVF to hospital team VTE: Heparin  per pharmacy; Can resume coumadin  ID: [email or web address]     LOS: 0 days   I reviewed specialist notes, hospitalist notes, last 24 h vitals and pain scores, last 48 h intake and output, last 24 h labs and trends, and last 24 h imaging results.  This care required moderate level of medical decision making.    Marjorie Carlyon Favre, [facility] Surgery 05/31/2024, 8:29 AM Please see Amion for pager number during day hours 7:00am-4:30pm

## 2024-05-31 NOTE — Progress Notes (Addendum)
 PHARMACY - ANTICOAGULATION CONSULT NOTE  Pharmacy Consult for warfarin >> heparin  Indication: atrial fibrillation  Patient Measurements: Height: 5' 8 (172.7 cm) Weight: 68.9 kg (152 lb) IBW/kg (Calculated) : 68.4 HEPARIN  DW (KG): 68.9  Vital Signs: Temp: 97.8 F (36.6 C) (08/19 2340) BP: 164/70 (08/19 2340) Pulse Rate: 64 (08/19 2340)  Labs: Recent Labs    05/28/24 1813 05/29/24 0253 05/30/24 0435 05/30/24 1821 05/30/24 1932 05/31/24 0254  HGB 12.6* 11.3*  --   --   --  11.3*  HCT 38.6* 34.7*  --   --   --  33.4*  PLT 204 178  --   --   --  173  LABPROT 26.2* 25.3* 20.4*  --   --  17.0*  INR 2.3* 2.2* 1.6*  --   --  1.3*  HEPARINUNFRC  --   --   --  <0.10* <0.10* 0.11*  CREATININE 1.35* 1.29*  --   --   --   --     Estimated Creatinine Clearance: 37.6 mL/min (A) (by C-G formula based on SCr of 1.29 mg/dL (H)).   Medical History: Past Medical History:  Diagnosis Date   AK (actinic keratosis)    Atrial fibrillation, persistent (HCC)    chronic coumadin  therapy   Bifascicular block    Blindness    left eye   CKD (chronic kidney disease), stage III (HCC)    Coronary artery disease    20% RCA by 2014 cath   Elevated PSA    GERD (gastroesophageal reflux disease)    H/O hiatal hernia    Heart murmur    History of kidney stones    Hydrocele    left   Hyperlipidemia    Hypertension    Internal hemorrhoids    Kidney stones    kidney stones   Macular degeneration    right eye   Mitral regurgitation 02/25/2011   Recurrent MR s/p mitral valve repair then bioprosthetic MVR 2014   OA (osteoarthritis) of neck    S/P aortic valve replacement with bioprosthetic valve 08/15/2013   23mm Edwards Magna Ease bovine pericardial tissue valve   S/P mitral valve repair 09/15/2004   Complex valvuloplasty including artificial Goretex neocord placement x4 and 36mm SARP ring annuloplasty via right mini thoracotomy approach - Dr Alford @ [facility]   S/P redo mitral valve replacement  and aortic valve replacement with bioprosthetic valves 08/15/2013   27mm Edwards Magna Mitral bovine pericardial tissue valve   Severe aortic stenosis 11/14   tissue AVR    Vocal cord paralysis     Medications:  -Warfarin 7.5mg  PO every Tuesday; 5mg  PO all other days (Last dose: 8/17)  Assessment: 57 yoM presented to ED with pain at site of a known hernia. PMH includes history of afib, bAVR, bMVR, and HTN. Pharmacy consulted to dose heparin  for afib.  8/19 AM: INR 1.6, below goal of 2-3 and appropriate to start heparin . Per [facility] note, no plans for operative intervention at this time unless deemed to be absolutely necessary.   PM: HL < 0.1, per RN - infusion running appropriately at 1000 units/hr. No bleeding reported.  8/20 AM - HL 0.11 (~7h after rate change) on 1150 units/hr.  No interruptions/infusion issues per RN.   INR 1.3, CBC stable (Hgb 11.3, pltc 163).   Goal of Therapy:  INR 2-3 Heparin  level 0.3-0.7 units/ml Monitor platelets by anticoagulation protocol: Yes   Plan:  Heparin  1000 units IV bolus x 1 Increase heparin  infusion at  1350 units/hr  8h heparin  level Continue to monitor H&H and platelets Follow up if needs repair of hernia if continues to have pain  Follow up transition back to Coumadin  - cleared by surgery 8/19  Maurilio Fila, PharmD Clinical Pharmacist 05/31/2024  3:54 AM

## 2024-05-31 NOTE — Discharge Summary (Signed)
 Physician Discharge Summary  Jason [name] FMW:[id number] DOB: 11-27-1931 DOA: 05/28/2024  PCP: Arloa [name] SAUNDERS, MD  Admit date: 05/28/2024  Discharge date: 05/31/2024  Admitted From:Home  Disposition:  Home  Recommendations for Outpatient Follow-up:  Follow up with PCP in 1-2 weeks Continue home medications as prior and finish course of antibiotics for recent toe infection with Keflex Follow-up with pulmonology with referral sent regarding lung nodule concerning for malignancy Lovenox  prescribed for bridging while resuming warfarin, follow-up with Coumadin  clinic for INR check Outpatient cardiology follow-up as recommended  Home Health: Yes with home health RN  Equipment/Devices: None  Discharge Condition:Stable  CODE STATUS: Full  Diet recommendation: Heart Healthy  Brief/Interim Summary: 89 y.o. 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 CT abdomen pelvis was done shows small bowel obstruction with transition point in the low central pelvis with inflamed small bowel extending from the transition point to the ileocecal wall. May be due to infectious or inflammatory enteritis. Ischemic bowel considered less likely but not excluded. Given the moderate narrowing of the celiac axis and SMA. Small 0.9 cm fat-containing supraumbilical hernia with associated stranding in the herniated fat. General surgery consulted and no urgent surgical interventions needed. Cardiology on board for preop clearance.  Patient ultimately did not require any intervention and had Coumadin  held and was started on IV heparin  instead.  His INR is currently subtherapeutic and he will be discharged with IV Lovenox   injections for bridging while subtherapeutic and will need close monitoring outpatient.  He was also incidentally noted to have a pulmonary nodule which will require follow-up with pulmonology.  Discharge Diagnoses:  Principal Problem:   SBO (small bowel obstruction) (HCC) Active Problems:   Atrial fibrillation, persistent (HCC)   Chronic anticoagulation   HTN (hypertension)   Pure hypercholesterolemia   S/P redo mitral valve replacement and aortic valve replacement with bioprosthetic valves Aug 15 2013   S/P aortic valve replacement with bioprosthetic valve   ARF (acute renal failure) (HCC)   CKD (chronic kidney disease), stage III (HCC)   Anemia  Principal discharge diagnosis: Abdominal pain with epigastric hernia resolved with conservative measures.  Discharge Instructions  Discharge Instructions     Diet - low sodium heart healthy   Complete by: As directed    Increase activity slowly   Complete by: As directed    Pulmonary Visit   Complete by: As directed    Reason for referral: Lung Mass/Lung Nodule   Does the patient's CT scan have any urgent finds? ("lung mass" (3+cm), suspected "metastatic disease," nodules >32mm with high-risk characteristics such as "spiculation, pleural tenting, adenopathy," or non-calcified nodule >48mm): Yes      Allergies as of 05/31/2024       Reactions   Penicillins Rash        Medication List     TAKE these medications    acetaminophen  650 MG CR tablet Commonly known as: TYLENOL  Take 650 mg by mouth 2 (two) times daily.   atorvastatin  20 MG tablet Commonly known as: LIPITOR Take 20 mg by mouth at bedtime.   b complex vitamins tablet Take 1 tablet by mouth daily.   brimonidine  0.2 % ophthalmic solution Commonly known as: ALPHAGAN  Place 1 drop into both eyes 2 (two) times daily.   cephALEXin  500 MG capsule Commonly known as: KEFLEX Take 500 mg by mouth every 6 (six) hours.   diltiazem  120 MG 24 hr capsule Commonly known  as: CARDIZEM  CD TAKE 1 CAPSULE BY MOUTH EVERY DAY   enoxaparin  100 MG/ML injection Commonly known as: LOVENOX  Inject 1 mL (100 mg total) into the skin daily for 7 days.   furosemide  40 MG tablet Commonly known as: LASIX  40 mg in the am and 20 mg in the pm   lisinopril  10 MG tablet Commonly known as: ZESTRIL  Take 10 mg by mouth daily.   metoprolol  succinate 25 MG 24 hr tablet Commonly known as: TOPROL -XL Take 0.5 tablets (12.5 mg total) by mouth in the morning and at bedtime.   PRESERVISION AREDS PO Take 1 tablet by mouth 2 (two) times daily.   vitamin C 1000 MG tablet Take 1,000 mg by mouth daily.   warfarin 5 MG tablet Commonly known as: COUMADIN  Take as directed. If you are unsure how to take this medication, talk to your nurse or doctor. Original instructions: TAKE AS DIRECTED BY COUMADIN  CLINIC What changed: See the new instructions.        Follow-up Information     Arloa [name] SAUNDERS, MD. Schedule an appointment as soon as possible for a visit in 1 week(s).   Specialty: Family Medicine Contact information: [phone] [address] [place] [address] [phone]                Allergies  Allergen Reactions   Penicillins Rash    Consultations: General Surgery   Procedures/Studies: CT CHEST W CONTRAST Result Date: 05/29/2024 CLINICAL DATA:  Pulmonary nodule on x-ray. EXAM: CT CHEST WITH CONTRAST TECHNIQUE: Multidetector CT imaging of the chest was performed during intravenous contrast administration. RADIATION DOSE REDUCTION: This exam was performed according to the departmental dose-optimization program which includes automated exposure control, adjustment of the mA and/or kV according to patient size and/or use of iterative reconstruction technique. CONTRAST:  50mL OMNIPAQUE  IOHEXOL  350 MG/ML SOLN COMPARISON:  Chest CT 05/05/2023 FINDINGS: Cardiovascular: The heart is enlarged. No substantial pericardial effusion. Status post aortic and mitral  valve replacement. Moderate atherosclerotic calcification is noted in the wall of the thoracic aorta. Enlargement of the pulmonary outflow tract/main pulmonary arteries suggests pulmonary arterial hypertension. Mediastinum/Nodes: No mediastinal lymphadenopathy. There is no hilar lymphadenopathy. Small hiatal hernia. The esophagus has normal imaging features. There is no axillary lymphadenopathy. Lungs/Pleura: Centrilobular and paraseptal emphysema evident. Calcified granuloma noted posterior left upper lobe and posterior right lower lobe. 1.8 x 1.4 cm irregular nodule is identified in the right middle lobe (image 97/series 4) new in the interval since the prior study. Bandlike area of consolidative opacity in the posterior right lower lobe is progressive in the interval, presumably atelectatic although pneumonia is not excluded. Interval progression of volume loss in the paraspinal left lower lobe with bandlike atelectasis or scarring posteriorly in the left lower lobe. No substantial pleural effusion. Upper Abdomen: 12 mm low-density lesion inferior right liver cannot be definitively characterized. 5.9 cm exophytic water density lesion upper pole right kidney is compatible with a simple cyst. No followup imaging is recommended. Cystic lesion in the interpolar left kidney has some apparent internal septation. Musculoskeletal: No worrisome lytic or sclerotic osseous abnormality. Fixation hardware noted anterior right fourth rib. IMPRESSION: 1. 1.8 x 1.4 cm irregular nodule in the right middle lobe, new in the interval since the prior chest CT. Imaging features are concerning for neoplasm. 2. Bandlike area of  consolidative opacity in the posterior right lower lobe is progressive in the interval, presumably atelectatic although pneumonia is not excluded. 3. Interval progression of volume loss in the paraspinal left lower lobe with bandlike atelectasis or scarring posteriorly in the left lower lobe. 4. Enlargement of  the pulmonary outflow tract/main pulmonary arteries suggests pulmonary arterial hypertension. 5. 12 mm low-density lesion inferior right liver cannot be definitively characterized. Attention on follow-up recommended. 6. Cystic lesion in the interpolar left kidney has some apparent internal septation. See report for recent abdomen/pelvis CT performed yesterday. 7. Aortic Atherosclerosis (ICD10-I70.0) and Emphysema (ICD10-J43.9). Electronically Signed   By: Camellia Candle M.D.   On: 05/29/2024 06:25   CT ABDOMEN PELVIS W CONTRAST Result Date: 05/28/2024 CLINICAL DATA:  Abdominal wall hernia suspected. Upper abdominal pain rated 10/10. Noticed a mass about a week ago. Palpable mass in upper abdomen in the midline. History of hernia with multiple repairs. EXAM: CT ABDOMEN AND PELVIS WITH CONTRAST TECHNIQUE: Multidetector CT imaging of the abdomen and pelvis was performed using the standard protocol following bolus administration of intravenous contrast. RADIATION DOSE REDUCTION: This exam was performed according to the departmental dose-optimization program which includes automated exposure control, adjustment of the mA and/or kV according to patient size and/or use of iterative reconstruction technique. CONTRAST:  75mL OMNIPAQUE  IOHEXOL  350 MG/ML SOLN COMPARISON:  CT 07/24/2013 FINDINGS: Lower chest: 1.9 x 1.4 cm irregular nodule in the right middle lobe on series 7, image 21 was not present on 05/05/2023. This is concerning for malignancy and dedicated chest CT is recommended. Cardiomegaly. Hepatobiliary: Unchanged hypoattenuating lesions in the liver compatible with cysts. Cholelithiasis. No evidence of acute cholecystitis. No biliary dilation. Pancreas: Unremarkable. Spleen: Unremarkable. Adrenals/Urinary Tract: Normal adrenal glands. Cortical renal scarring in both kidneys. Nonobstructing left nephrolithiasis no hydronephrosis. Unremarkable bladder. Stomach/Bowel: Small hiatal hernia. The lower esophagus is  distended with fluid. Normal appendix. Dilated loops of small bowel throughout the abdomen with abrupt transition point in the low central pelvis immediately distal to a fecalized segment of small bowel (series 3, image 59-64). There is wall thickening and mucosal hyperenhancement of the small bowel immediately distal to the transition point extending to the ileocecal valve. Normal caliber colon without wall thickening. Vascular/Lymphatic: Advanced arterial atherosclerotic calcification including the aorta and its mesenteric, renal, and iliac artery branches. 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 No lymphadenopathy. Reproductive: Enlarged prostate.  Large right hydrocele. Other: Small 0.9 cm fat containing supraumbilical hernia. There is associated stranding in the herniated fat. Correlate for strangulation/incarceration. No free intraperitoneal fluid or air. Musculoskeletal: No acute fracture. IMPRESSION: 1. Small-bowel obstruction with transition point in the low central pelvis. 2. Inflamed small bowel extends from the transition point to the ileocecal valve. This may be due to infectious or inflammatory enteritis. Ischemic bowel considered less likely though not excluded given at least moderate narrowing of the celiac axis and SMA due to advanced arterial atherosclerosis. 3. Small 0.9 cm fat containing supraumbilical hernia with associated stranding in the herniated fat. Correlate for strangulation/incarceration. 4. 1.9 cm irregular nodule in the right middle lobe was not present on 05/05/2023. This is concerning for malignancy and dedicated chest CT is recommended. 5. Large right hydrocele. 6. Small hiatal hernia.  Fluid-filled esophagus suggesting reflux. 7. Aortic Atherosclerosis (ICD10-I70.0). Electronically Signed   By: Norman Gatlin M.D.   On: 05/28/2024 19:38     Discharge Exam: Vitals:   05/31/24 0813 05/31/24 1224  BP: (!) 161/60 (!) 138/52  Pulse: 60 94   Resp: 17 17  Temp: 98 F (36.7 C) 97.8 F (36.6 C)  SpO2: 97% 95%   Vitals:   05/30/24 2340 05/31/24 0428 05/31/24 0813 05/31/24 1224  BP: (!) 164/70 (!) 157/66 (!) 161/60 (!) 138/52  Pulse: 64 (!) 57 60 94  Resp: 17 17 17 17   Temp: 97.8 F (36.6 C) 97.7 F (36.5 C) 98 F (36.7 C) 97.8 F (36.6 C)  TempSrc:      SpO2: 95% 98% 97% 95%  Weight:      Height:        General: Pt is alert, awake, not in acute distress Cardiovascular: RRR, S1/S2 +, no rubs, no gallops Respiratory: CTA bilaterally, no wheezing, no rhonchi Abdominal: Soft, NT, ND, bowel sounds + Extremities: no edema, no cyanosis    The results of significant diagnostics from this hospitalization (including imaging, microbiology, ancillary and laboratory) are listed below for reference.     Microbiology: No results found for this or any previous visit (from the past 240 hours).   Labs: BNP (last 3 results) No results for input(s): BNP in the last 8760 hours. Basic Metabolic Panel: Recent Labs  Lab 05/28/24 1813 05/29/24 0253  NA 136 136  K 4.4 4.1  CL 99 102  CO2 26 26  GLUCOSE 139* 122*  BUN 15 14  CREATININE 1.35* 1.29*  CALCIUM  9.9 9.5   Liver Function Tests: Recent Labs  Lab 05/28/24 1813 05/29/24 0253  AST 22 18  ALT 13 12  ALKPHOS 106 87  BILITOT 1.1 0.9  PROT 7.0 6.1*  ALBUMIN  3.9 3.2*   Recent Labs  Lab 05/28/24 1813  LIPASE 40   No results for input(s): AMMONIA in the last 168 hours. CBC: Recent Labs  Lab 05/28/24 1813 05/29/24 0253 05/31/24 0254  WBC 12.6* 8.6 8.1  NEUTROABS 11.2*  --   --   HGB 12.6* 11.3* 11.3*  HCT 38.6* 34.7* 33.4*  MCV 90.0 88.7 87.4  PLT 204 178 173   Cardiac Enzymes: No results for input(s): CKTOTAL, CKMB, CKMBINDEX, TROPONINI in the last 168 hours. BNP: Invalid input(s): POCBNP CBG: No results for input(s): GLUCAP in the last 168 hours. D-Dimer No results for input(s): DDIMER in the last 72 hours. Hgb A1c No  results for input(s): HGBA1C in the last 72 hours. Lipid Profile No results for input(s): CHOL, HDL, LDLCALC, TRIG, CHOLHDL, LDLDIRECT in the last 72 hours. Thyroid  function studies No results for input(s): TSH, T4TOTAL, T3FREE, THYROIDAB in the last 72 hours.  Invalid input(s): FREET3 Anemia work up No results for input(s): VITAMINB12, FOLATE, FERRITIN, TIBC, IRON, RETICCTPCT in the last 72 hours. Urinalysis    Component Value Date/Time   COLORURINE YELLOW 08/11/2013 1552   APPEARANCEUR CLEAR 08/11/2013 1552   LABSPEC 1.014 08/11/2013 1552   PHURINE 5.0 08/11/2013 1552   GLUCOSEU NEGATIVE 08/11/2013 1552   HGBUR NEGATIVE 08/11/2013 1552   BILIRUBINUR NEGATIVE 08/11/2013 1552   KETONESUR NEGATIVE 08/11/2013 1552   PROTEINUR NEGATIVE 08/11/2013 1552   UROBILINOGEN 0.2 08/11/2013 1552   NITRITE NEGATIVE 08/11/2013 1552   LEUKOCYTESUR NEGATIVE 08/11/2013 1552   Sepsis Labs Recent Labs  Lab 05/28/24 1813 05/29/24 0253 05/31/24 0254  WBC 12.6* 8.6 8.1   Microbiology No results found for this or any previous visit (from the past 240 hours).   Time coordinating discharge: 35 minutes  SIGNED:   Adron JONETTA Fairly, DO [facility] 05/31/2024, 12:39 PM  If 7PM-7AM, please contact night-coverage [email or web address]

## 2024-05-31 NOTE — Plan of Care (Signed)

## 2024-06-02 DIAGNOSIS — D531 Other megaloblastic anemias, not elsewhere classified: Secondary | ICD-10-CM | POA: Diagnosis not present

## 2024-06-02 DIAGNOSIS — I129 Hypertensive chronic kidney disease with stage 1 through stage 4 chronic kidney disease, or unspecified chronic kidney disease: Secondary | ICD-10-CM | POA: Diagnosis not present

## 2024-06-02 DIAGNOSIS — N183 Chronic kidney disease, stage 3 unspecified: Secondary | ICD-10-CM | POA: Diagnosis not present

## 2024-06-02 DIAGNOSIS — K56699 Other intestinal obstruction unspecified as to partial versus complete obstruction: Secondary | ICD-10-CM | POA: Diagnosis not present

## 2024-06-02 DIAGNOSIS — L089 Local infection of the skin and subcutaneous tissue, unspecified: Secondary | ICD-10-CM | POA: Diagnosis not present

## 2024-06-05 ENCOUNTER — Ambulatory Visit: Attending: Interventional Cardiology

## 2024-06-05 DIAGNOSIS — E782 Mixed hyperlipidemia: Secondary | ICD-10-CM | POA: Diagnosis not present

## 2024-06-05 DIAGNOSIS — Z5181 Encounter for therapeutic drug level monitoring: Secondary | ICD-10-CM | POA: Diagnosis not present

## 2024-06-05 DIAGNOSIS — I4821 Permanent atrial fibrillation: Secondary | ICD-10-CM | POA: Insufficient documentation

## 2024-06-05 DIAGNOSIS — Z8679 Personal history of other diseases of the circulatory system: Secondary | ICD-10-CM | POA: Diagnosis not present

## 2024-06-05 DIAGNOSIS — Z0000 Encounter for general adult medical examination without abnormal findings: Secondary | ICD-10-CM | POA: Diagnosis not present

## 2024-06-05 DIAGNOSIS — Z952 Presence of prosthetic heart valve: Secondary | ICD-10-CM | POA: Diagnosis not present

## 2024-06-05 DIAGNOSIS — H6992 Unspecified Eustachian tube disorder, left ear: Secondary | ICD-10-CM | POA: Diagnosis not present

## 2024-06-05 DIAGNOSIS — I10 Essential (primary) hypertension: Secondary | ICD-10-CM | POA: Diagnosis not present

## 2024-06-05 DIAGNOSIS — Z953 Presence of xenogenic heart valve: Secondary | ICD-10-CM | POA: Diagnosis not present

## 2024-06-05 DIAGNOSIS — I509 Heart failure, unspecified: Secondary | ICD-10-CM | POA: Diagnosis not present

## 2024-06-05 DIAGNOSIS — K56609 Unspecified intestinal obstruction, unspecified as to partial versus complete obstruction: Secondary | ICD-10-CM | POA: Diagnosis not present

## 2024-06-05 DIAGNOSIS — R911 Solitary pulmonary nodule: Secondary | ICD-10-CM | POA: Diagnosis not present

## 2024-06-05 LAB — POCT INR: INR: 1.3 — AB (ref 2.0–3.0)

## 2024-06-05 NOTE — Progress Notes (Signed)
 INR 1.3 Please see anticoagulation encounter Take 2 tablets today and tomorrow only then Continue taking warfarin 1 tablet daily except 1.5 tablets on Tuesdays.  Recheck INR in 2 weeks.  Coumadin  Clinic [phone].

## 2024-06-05 NOTE — Patient Instructions (Signed)
 Take 2 tablets today and tomorrow only then Continue taking warfarin 1 tablet daily except 1.5 tablets on Tuesdays.  Recheck INR in 2 weeks.  Coumadin  Clinic [phone].

## 2024-06-06 DIAGNOSIS — K56699 Other intestinal obstruction unspecified as to partial versus complete obstruction: Secondary | ICD-10-CM | POA: Diagnosis not present

## 2024-06-06 DIAGNOSIS — I129 Hypertensive chronic kidney disease with stage 1 through stage 4 chronic kidney disease, or unspecified chronic kidney disease: Secondary | ICD-10-CM | POA: Diagnosis not present

## 2024-06-07 DIAGNOSIS — Z08 Encounter for follow-up examination after completed treatment for malignant neoplasm: Secondary | ICD-10-CM | POA: Diagnosis not present

## 2024-06-07 DIAGNOSIS — L821 Other seborrheic keratosis: Secondary | ICD-10-CM | POA: Diagnosis not present

## 2024-06-07 DIAGNOSIS — Z85828 Personal history of other malignant neoplasm of skin: Secondary | ICD-10-CM | POA: Diagnosis not present

## 2024-06-07 DIAGNOSIS — D225 Melanocytic nevi of trunk: Secondary | ICD-10-CM | POA: Diagnosis not present

## 2024-06-07 DIAGNOSIS — L570 Actinic keratosis: Secondary | ICD-10-CM | POA: Diagnosis not present

## 2024-06-07 DIAGNOSIS — L814 Other melanin hyperpigmentation: Secondary | ICD-10-CM | POA: Diagnosis not present

## 2024-06-08 DIAGNOSIS — H18423 Band keratopathy, bilateral: Secondary | ICD-10-CM | POA: Diagnosis not present

## 2024-06-08 DIAGNOSIS — H40013 Open angle with borderline findings, low risk, bilateral: Secondary | ICD-10-CM | POA: Diagnosis not present

## 2024-06-08 DIAGNOSIS — H401132 Primary open-angle glaucoma, bilateral, moderate stage: Secondary | ICD-10-CM | POA: Diagnosis not present

## 2024-06-08 DIAGNOSIS — H353211 Exudative age-related macular degeneration, right eye, with active choroidal neovascularization: Secondary | ICD-10-CM | POA: Diagnosis not present

## 2024-06-08 DIAGNOSIS — H35351 Cystoid macular degeneration, right eye: Secondary | ICD-10-CM | POA: Diagnosis not present

## 2024-06-08 DIAGNOSIS — H353222 Exudative age-related macular degeneration, left eye, with inactive choroidal neovascularization: Secondary | ICD-10-CM | POA: Diagnosis not present

## 2024-06-08 DIAGNOSIS — H35371 Puckering of macula, right eye: Secondary | ICD-10-CM | POA: Diagnosis not present

## 2024-06-08 DIAGNOSIS — H353113 Nonexudative age-related macular degeneration, right eye, advanced atrophic without subfoveal involvement: Secondary | ICD-10-CM | POA: Diagnosis not present

## 2024-06-09 DIAGNOSIS — K56699 Other intestinal obstruction unspecified as to partial versus complete obstruction: Secondary | ICD-10-CM | POA: Diagnosis not present

## 2024-06-09 DIAGNOSIS — I129 Hypertensive chronic kidney disease with stage 1 through stage 4 chronic kidney disease, or unspecified chronic kidney disease: Secondary | ICD-10-CM | POA: Diagnosis not present

## 2024-06-11 DIAGNOSIS — I509 Heart failure, unspecified: Secondary | ICD-10-CM | POA: Diagnosis not present

## 2024-06-11 DIAGNOSIS — I10 Essential (primary) hypertension: Secondary | ICD-10-CM | POA: Diagnosis not present

## 2024-06-11 DIAGNOSIS — E782 Mixed hyperlipidemia: Secondary | ICD-10-CM | POA: Diagnosis not present

## 2024-06-13 ENCOUNTER — Telehealth: Payer: Self-pay | Admitting: Internal Medicine

## 2024-06-13 DIAGNOSIS — K56699 Other intestinal obstruction unspecified as to partial versus complete obstruction: Secondary | ICD-10-CM | POA: Diagnosis not present

## 2024-06-13 DIAGNOSIS — I129 Hypertensive chronic kidney disease with stage 1 through stage 4 chronic kidney disease, or unspecified chronic kidney disease: Secondary | ICD-10-CM | POA: Diagnosis not present

## 2024-06-13 NOTE — Telephone Encounter (Signed)
 STAT if HR is under 50 or over 120  (normal HR is 60-100 beats per minute)  What is your heart rate? 44 (in the 40s for about a week according to his bp monitor)  Do you have a log of your heart rate readings (document readings)?    Do you have any other symptoms? No other symptoms   Nurse is not with the patient any longer. Please advise

## 2024-06-13 NOTE — Telephone Encounter (Signed)
 Spoke with pt who reports HR readings have been in the 40's at times over the past week.  Current BP 153/69 with HR-71.  Pt denies current CP, SOB or dizziness.  Pt reports being asymptomatic at the time of HR readings in the 40's.  Pt is taking medications as prescribed.  He is scheduled for an echoand to see Dr Wendel on 07/25/2024. Pt advised to continue monitoring BP and HR.  Reviewed ED precautions. Will forward to Dr Wendel for review and further recommendation.

## 2024-06-14 NOTE — Telephone Encounter (Signed)
 Thukkani, Arun K, MD to Casimir Aldona BRAVO, RN  Daisy, RN    06/14/24  6:46 AM Monitor for now   Left message with advice from MD

## 2024-06-15 ENCOUNTER — Other Ambulatory Visit: Payer: Self-pay | Admitting: Internal Medicine

## 2024-06-17 DIAGNOSIS — I509 Heart failure, unspecified: Secondary | ICD-10-CM | POA: Diagnosis not present

## 2024-06-17 DIAGNOSIS — I10 Essential (primary) hypertension: Secondary | ICD-10-CM | POA: Diagnosis not present

## 2024-06-20 ENCOUNTER — Ambulatory Visit: Attending: Interventional Cardiology | Admitting: *Deleted

## 2024-06-20 DIAGNOSIS — Z953 Presence of xenogenic heart valve: Secondary | ICD-10-CM | POA: Diagnosis not present

## 2024-06-20 DIAGNOSIS — Z952 Presence of prosthetic heart valve: Secondary | ICD-10-CM | POA: Insufficient documentation

## 2024-06-20 DIAGNOSIS — I4821 Permanent atrial fibrillation: Secondary | ICD-10-CM | POA: Insufficient documentation

## 2024-06-20 DIAGNOSIS — Z5181 Encounter for therapeutic drug level monitoring: Secondary | ICD-10-CM | POA: Insufficient documentation

## 2024-06-20 DIAGNOSIS — I129 Hypertensive chronic kidney disease with stage 1 through stage 4 chronic kidney disease, or unspecified chronic kidney disease: Secondary | ICD-10-CM | POA: Diagnosis not present

## 2024-06-20 DIAGNOSIS — K56699 Other intestinal obstruction unspecified as to partial versus complete obstruction: Secondary | ICD-10-CM | POA: Diagnosis not present

## 2024-06-20 LAB — POCT INR: INR: 1.8 — AB (ref 2.0–3.0)

## 2024-06-20 NOTE — Progress Notes (Signed)
 Description   INR-1.8; Today take 2 tablets of warfarin then continue taking warfarin 1 tablet daily except 1.5 tablets on Tuesdays.  Recheck INR in 2 weeks.  Coumadin  Clinic [phone]

## 2024-06-20 NOTE — Patient Instructions (Signed)
 Description   INR-1.8; Today take 2 tablets of warfarin then continue taking warfarin 1 tablet daily except 1.5 tablets on Tuesdays.  Recheck INR in 2 weeks.  Coumadin  Clinic [phone]

## 2024-06-26 ENCOUNTER — Ambulatory Visit

## 2024-06-26 VITALS — BP 172/75 | HR 60 | Temp 97.8°F | Ht 68.0 in | Wt 148.8 lb

## 2024-06-26 DIAGNOSIS — I340 Nonrheumatic mitral (valve) insufficiency: Secondary | ICD-10-CM | POA: Diagnosis not present

## 2024-06-26 DIAGNOSIS — Z7901 Long term (current) use of anticoagulants: Secondary | ICD-10-CM

## 2024-06-26 DIAGNOSIS — R911 Solitary pulmonary nodule: Secondary | ICD-10-CM

## 2024-06-26 NOTE — Progress Notes (Signed)
 Subjective:   PATIENT ID: Jason [name] Fair GENDER: male DOB: 12-Aug-1932, MRN: [id number]   HPI 89 year old male with a past medical history of bioprosthetic aortic and mitral valve, permanent A-fib, hypertension, hyperlipidemia, CAD, epigastric hernia and recent bowel obstruction on Coumadin .  He was recently admitted to the hospital for the bowel obstruction and a CT chest was performed which showed a 1.8 x 1.4 cm irregular nodule in the right middle lobe concerning for neoplasm.  He was sent to pulmonary for further evaluation of this nodule.   Patient denies any family history of lung cancer.  Patient himself does not have a history of malignancy.  He remotely smoked only for 5 years in his 60s and quit in 1965.  He denies any cough or hemoptysis.  He denies any weight loss.  He denies any loss of appetite.  He has no chills or fevers or night sweats. During his hospitalization cardiology was consulted for preop evaluation.  At that time, advised to proceed with surgery if urgently indicated.  He was also switched from Coumadin  to heparin  during his hospitalization but now he is back on Coumadin .  He states that his most recent INR is 1.8.    Past Medical History:  Diagnosis Date   AK (actinic keratosis)    Atrial fibrillation, persistent (HCC)    chronic coumadin  therapy   Bifascicular block    Blindness    left eye   CKD (chronic kidney disease), stage III (HCC)    Coronary artery disease    20% RCA by 2014 cath   Elevated PSA    GERD (gastroesophageal reflux disease)    H/O hiatal hernia    Heart murmur    History of kidney stones    Hydrocele    left   Hyperlipidemia    Hypertension    Internal hemorrhoids    Kidney stones    kidney stones   Macular degeneration    right eye   Mitral regurgitation 02/25/2011   Recurrent MR s/p mitral valve repair then bioprosthetic MVR 2014   OA (osteoarthritis) of neck    S/P aortic valve replacement with bioprosthetic valve  08/15/2013   23mm Edwards Magna Ease bovine pericardial tissue valve   S/P mitral valve repair 09/15/2004   Complex valvuloplasty including artificial Goretex neocord placement x4 and 36mm SARP ring annuloplasty via right mini thoracotomy approach - Dr Alford @ [facility]   S/P redo mitral valve replacement and aortic valve replacement with bioprosthetic valves 08/15/2013   27mm Edwards Magna Mitral bovine pericardial tissue valve   Severe aortic stenosis 11/14   tissue AVR    Vocal cord paralysis      Family History  Problem Relation Age of Onset   Cancer Father        colon   Hypertension Father    Diabetes Father    Hyperlipidemia Father    Stroke Father    Hypertension Mother    Diabetes Mother    Hyperlipidemia Mother    Stroke Mother    Heart attack Mother    Hypertension Brother        MI, S/P MVR     Social History   Socioeconomic History   Marital status: Widowed    Spouse name: Not on file   Number of children: 0   Years of education: Not on file   Highest education level: Not on file  Occupational History   Occupation: retired  Tobacco Use   Smoking status:  Former    Current packs/day: 0.00    Types: Cigarettes    Start date: 10/12/1970    Quit date: 10/12/1978    Years since quitting: 45.7   Smokeless tobacco: Never  Vaping Use   Vaping status: Never Used  Substance and Sexual Activity   Alcohol use: No   Drug use: No   Sexual activity: Not on file  Other Topics Concern   Not on file  Social History Narrative   Not on file   Social Drivers of Health   Financial Resource Strain: Not on file  Food Insecurity: No Food Insecurity (05/29/2024)   Hunger Vital Sign    Worried About Running Out of Food in the Last Year: Never true    Ran Out of Food in the Last Year: Never true  Transportation Needs: No Transportation Needs (05/29/2024)   PRAPARE - Administrator, Civil Service (Medical): No    Lack of Transportation (Non-Medical): No  Physical  Activity: Not on file  Stress: Not on file  Social Connections: Not on file  Intimate Partner Violence: Not At Risk (05/29/2024)   Humiliation, Afraid, Rape, and Kick questionnaire    Fear of Current or Ex-Partner: No    Emotionally Abused: No    Physically Abused: No    Sexually Abused: No     Allergies  Allergen Reactions   Penicillins Rash     Outpatient Medications Prior to Visit  Medication Sig Dispense Refill   acetaminophen  (TYLENOL ) 650 MG CR tablet Take 650 mg by mouth 2 (two) times daily.      Ascorbic Acid (VITAMIN C) 1000 MG tablet Take 1,000 mg by mouth daily.     atorvastatin  (LIPITOR) 20 MG tablet Take 20 mg by mouth at bedtime.      b complex vitamins tablet Take 1 tablet by mouth daily.     brimonidine  (ALPHAGAN ) 0.2 % ophthalmic solution Place 1 drop into both eyes 2 (two) times daily.     diltiazem  (CARDIZEM  CD) 120 MG 24 hr capsule TAKE 1 CAPSULE BY MOUTH EVERY DAY 90 capsule 3   furosemide  (LASIX ) 40 MG tablet 40 mg in the am and 20 mg in the pm 30 tablet    lisinopril  (ZESTRIL ) 10 MG tablet Take 10 mg by mouth daily.     metoprolol  succinate (TOPROL -XL) 25 MG 24 hr tablet TAKE 0.5 TABLETS (12.5 MG TOTAL) BY MOUTH IN THE MORNING AND AT BEDTIME. 90 tablet 1   Multiple Vitamins-Minerals (PRESERVISION AREDS PO) Take 1 tablet by mouth 2 (two) times daily.      warfarin (COUMADIN ) 5 MG tablet TAKE AS DIRECTED BY COUMADIN  CLINIC (Patient taking differently: Take 5-7.5 mg by mouth See admin instructions. Take 1 tablet by mouth daily, except every Tuesday take 1.5 tablets or as directed by Coumadin  clinic) 120 tablet 1   cephALEXin (KEFLEX) 500 MG capsule Take 500 mg by mouth every 6 (six) hours.     enoxaparin  (LOVENOX ) 100 MG/ML injection Inject 1 mL (100 mg total) into the skin daily for 7 days. 7 mL 0   No facility-administered medications prior to visit.    ROS Reviewed all systems and reported negative except as above     Objective:   Vitals:   06/26/24  1023  BP: (!) 172/75  Pulse: 60  Temp: 97.8 F (36.6 C)  TempSrc: Oral  SpO2: 94%  Weight: 148 lb 12.8 oz (67.5 kg)  Height: 5' 8 (1.727 m)  Physical Exam General: Elderly male not in acute distress Chest: Clear to auscultation bilaterally Heart: Systolic and diastolic murmur Abdomen: Soft, nontender, nondistended  Extremities: No peripheral edema    CBC    Component Value Date/Time   WBC 8.1 05/31/2024 0254   RBC 3.82 (L) 05/31/2024 0254   HGB 11.3 (L) 05/31/2024 0254   HGB 12.8 (L) 04/16/2023 1220   HCT 33.4 (L) 05/31/2024 0254   HCT 39.1 04/16/2023 1220   PLT 173 05/31/2024 0254   PLT 229 04/16/2023 1220   MCV 87.4 05/31/2024 0254   MCV 87 04/16/2023 1220   MCH 29.6 05/31/2024 0254   MCHC 33.8 05/31/2024 0254   RDW 13.2 05/31/2024 0254   RDW 13.2 04/16/2023 1220   LYMPHSABS 0.5 (L) 05/28/2024 1813   MONOABS 0.6 05/28/2024 1813   EOSABS 0.0 05/28/2024 1813   BASOSABS 0.0 05/28/2024 1813     Chest imaging:  PFT:     No data to display            Echo:  Echocardiogram from 01/17/2024 with normal LVEF estimated to be 55 to 60%.  Mitral valve bioprosthetic from 2014 with thickened leaflet with findings consistent with moderate to severe stenosis.  Bioprosthetic aortic valve, intact.     Assessment & Plan:   Assessment & Plan Lung nodule Patient is an 89 year old male with moderate severe mitral valve stenosis on Coumadin  presenting with right middle lobe lung nodule concerning for malignancy.  Plan to obtain a PET scan and ultimately will need a robotic navigational biopsy.  This was discussed with the patient.  For now, he would like to proceed with a PET scan to get more information before making a decision on biopsy.  He is currently on Coumadin  and considerations regarding stopping anticoagulation would need to be discussed prior to biopsy.   I will update the patient once his PET scan is performed.  Orders Placed This Encounter  Procedures    NM PET Image Initial (PI) Skull Base To Thigh    Standing Status:   Future    Expiration Date:   06/26/2025    If indicated for the ordered procedure, I authorize the administration of a radiopharmaceutical per Radiology protocol:   Yes    Preferred imaging location?:   Darryle Darra Zola Zaida, MD [facility] Office: [phone]

## 2024-06-26 NOTE — Patient Instructions (Signed)
 It was nice meeting you today in the clinic.  You were sent to the clinic for evaluation of lung nodule on your right lung.  We will get a PET scan to further evaluate your lung nodule and then I will give you a phone call afterwards.  Please call the clinic if you have any questions or concerns

## 2024-06-27 DIAGNOSIS — I129 Hypertensive chronic kidney disease with stage 1 through stage 4 chronic kidney disease, or unspecified chronic kidney disease: Secondary | ICD-10-CM | POA: Diagnosis not present

## 2024-06-27 DIAGNOSIS — K56699 Other intestinal obstruction unspecified as to partial versus complete obstruction: Secondary | ICD-10-CM | POA: Diagnosis not present

## 2024-07-04 ENCOUNTER — Ambulatory Visit: Attending: Interventional Cardiology | Admitting: *Deleted

## 2024-07-04 DIAGNOSIS — Z953 Presence of xenogenic heart valve: Secondary | ICD-10-CM | POA: Insufficient documentation

## 2024-07-04 DIAGNOSIS — Z5181 Encounter for therapeutic drug level monitoring: Secondary | ICD-10-CM | POA: Insufficient documentation

## 2024-07-04 DIAGNOSIS — I4821 Permanent atrial fibrillation: Secondary | ICD-10-CM | POA: Diagnosis not present

## 2024-07-04 DIAGNOSIS — Z952 Presence of prosthetic heart valve: Secondary | ICD-10-CM | POA: Insufficient documentation

## 2024-07-04 LAB — POCT INR: INR: 1.7 — AB (ref 2.0–3.0)

## 2024-07-04 NOTE — Progress Notes (Signed)
 Description   INR-1.7; Today take 2 tablets of warfarin then START taking warfarin 1 tablet daily except 1.5 tablets on Tuesdays and Saturdays.  Recheck INR in 2 weeks.  Coumadin  Clinic [phone]

## 2024-07-04 NOTE — Patient Instructions (Signed)
 Description   INR-1.7; Today take 2 tablets of warfarin then START taking warfarin 1 tablet daily except 1.5 tablets on Tuesdays and Saturdays.  Recheck INR in 2 weeks.  Coumadin  Clinic [phone]

## 2024-07-07 ENCOUNTER — Ambulatory Visit (HOSPITAL_COMMUNITY)
Admission: RE | Admit: 2024-07-07 | Discharge: 2024-07-07 | Disposition: A | Discharge status: Home / Self Care | Source: Ambulatory Visit

## 2024-07-07 DIAGNOSIS — R911 Solitary pulmonary nodule: Secondary | ICD-10-CM | POA: Insufficient documentation

## 2024-07-07 LAB — GLUCOSE, CAPILLARY: Glucose-Capillary: 122 mg/dL — ABNORMAL HIGH (ref 70–99)

## 2024-07-07 MED ORDER — FLUDEOXYGLUCOSE F - 18 (FDG) INJECTION
7.3700 | Freq: Once | INTRAVENOUS | Status: AC
  Administered 2024-07-07: 7.37 via INTRAVENOUS

## 2024-07-10 ENCOUNTER — Ambulatory Visit: Payer: Self-pay

## 2024-07-10 NOTE — Progress Notes (Signed)
 Called and spoke to pt - advised of results per Dr. Zaida. Pt verbalized understanding, NFN.

## 2024-07-11 DIAGNOSIS — I509 Heart failure, unspecified: Secondary | ICD-10-CM | POA: Diagnosis not present

## 2024-07-11 DIAGNOSIS — I10 Essential (primary) hypertension: Secondary | ICD-10-CM | POA: Diagnosis not present

## 2024-07-11 DIAGNOSIS — E782 Mixed hyperlipidemia: Secondary | ICD-10-CM | POA: Diagnosis not present

## 2024-07-17 DIAGNOSIS — I509 Heart failure, unspecified: Secondary | ICD-10-CM | POA: Diagnosis not present

## 2024-07-17 DIAGNOSIS — I10 Essential (primary) hypertension: Secondary | ICD-10-CM | POA: Diagnosis not present

## 2024-07-18 ENCOUNTER — Ambulatory Visit: Attending: Interventional Cardiology | Admitting: *Deleted

## 2024-07-18 DIAGNOSIS — Z952 Presence of prosthetic heart valve: Secondary | ICD-10-CM | POA: Diagnosis not present

## 2024-07-18 DIAGNOSIS — Z5181 Encounter for therapeutic drug level monitoring: Secondary | ICD-10-CM | POA: Insufficient documentation

## 2024-07-18 DIAGNOSIS — Z953 Presence of xenogenic heart valve: Secondary | ICD-10-CM | POA: Insufficient documentation

## 2024-07-18 DIAGNOSIS — I4821 Permanent atrial fibrillation: Secondary | ICD-10-CM | POA: Diagnosis not present

## 2024-07-18 LAB — POCT INR: INR: 2.3 (ref 2.0–3.0)

## 2024-07-18 NOTE — Progress Notes (Signed)
 Description   INR-2.3; Continue taking warfarin 1 tablet daily except 1.5 tablets on Tuesdays and Saturdays.  Recheck INR in 3 weeks.  Coumadin  Clinic [phone]

## 2024-07-18 NOTE — Patient Instructions (Signed)
 Description   INR-2.3; Continue taking warfarin 1 tablet daily except 1.5 tablets on Tuesdays and Saturdays.  Recheck INR in 3 weeks.  Coumadin  Clinic [phone]

## 2024-07-19 NOTE — Progress Notes (Signed)
 Patient ID: Jason [name] MRN: [id number] DOB/AGE: 11/20/1931 89 y.o.  Primary Care Physician:[name], [name] SAUNDERS, MD Primary Cardiologist: Wendel RUSK CARDIOVASCULAR PROBLEM LIST:   History of minimally invasive mitral valve repair 2005  MVR 27 mm Magna valve 2014  Bioprosthetic mitral valve degeneration (stenosis) MVA 1.1 MG 11 EF 55-60% TTE June 2024 MVA 1.2 MG11 EF 55-60% April 2025 AVR  23 mm Magna Ease valve 2014 AVA 1.76, MG 10, EF 55 to 60% TTE April 2025 Atrial fibrillaton On Coumadin  Hypertension HL Aortic atherosclerosis Chest CT 2024 CAD Mild; cath 2014 Bifasicular block RBBB + LAFB CKD IIIa BMI 22/BSA 1.8   HISTORY OF PRESENT ILLNESS:  April 16, 2023: The patient is a 89 y.o. male with the indicated medical history here for recommendations regarding bioprosthetic structural valve degeneration of his mitral bioprosthesis.  On review of the records the patient had undergone concomitant aortic and mitral valve replacements in 2014 due to aortic stenosis and severe degenerative mitral valve regurgitation with a Barlow's type valve (this was after the patient had undergone a minimally invasive mitral valve repair about 9 years prior at [facility]).  The patient was to have undergone maze at the same time but due to dense adhesions this was not performed.  Coronary angiography prior to the procedure demonstrated no significant obstructive disease.  He last saw Dr. Dann in May 2023 and was doing well without any issues with bleeding while on Coumadin .  He was seen by [name] a few months ago.  Peripheral edema was noted.  It has been sometime since he had an echocardiogram so one was ordered which demonstrated moderate RV dysfunction and severe bioprosthetic degeneration of the patient's mitral bioprosthesis with good aortic valve function.    The patient is here with his family.  He is doing very well.  He denies any symptoms of right-sided heart failure  including early satiety, abdominal distention, or severe peripheral edema.  He does have unilateral left lower extremity dependent edema which resolves with recumbency.  He denies any shortness of breath, chest pain, presyncope, or syncope.  He has been tolerating Coumadin  well without any serious bleeding episodes.  He is able to walk every day and do his activities of daily living without any issues.  He lives by himself.  He sees a Education officer, community and has cleanings every 6 months.  Plan: Review echocardiographic data with imaging core, check BNP, check exercise treadmill test, refer for right and left heart catheterization.  April 23 2023: In the interim I was able to review the patient's serial echocardiograms with her imaging core which showed no significant change in RV function over last several years.  Given his advanced age and lack of symptoms consensus opinion was to monitor for now.  The patient is here to discuss further.  The patient continues to do well without any evidence of right sided heart failure, dyspnea, angina, presyncope, or syncope.  He is able to do everything he needs to do in a day without any limitations.  Plan:  Follow up 9 months, noncontrast chest CT for pulmonary nodule seen on CXR/CT.  April 2025:  Patient consents to use of AI scribe. Chest CT showed no pulmonary nodules and some scarring of the lung bases.  A recent echocardiogram showed a relatively unchanged mitral valve gradient and EF.    He has experienced no changes in his condition since the last visit and reports no new symptoms. His breathing remains stable, allowing him  to perform daily activities without difficulty.  He is currently on Coumadin , taking six pills daily with an additional half pill on one day of the week. His INR levels have remained stable between 2 and 3, with no episodes of bleeding reported.  No recent hospital or emergency department visits.  Plan: Follow-up in 6 months.  October 2025:   Patient consents to use of AI scribe. In August of this year the patient was hospitalized for small bowel obstruction.  He was treated conservatively.  He returns for routine follow-up.  He had an echocardiogram done today which has not yet been read.  On my review the mean gradient looks to be about 9 mmHg.  The left atrium is severely dilated as was seen on his previous echocardiogram.  His aortic valve replacement was associated with a mean gradient of around 19 to 20 mmHg with a V-max of around 3 m/s.  No shortness of breath, leg swelling, or difficulty lying flat.  He is on Coumadin  for anticoagulation and experiences bruising but no significant bleeding issues. He monitors his blood pressure at home, with an average reading of 126 mmHg over the past month. His cholesterol was checked in August and was reported to be good, with a low-density lipoprotein (LDL) level of 44 mg/dL.  In August, he was hospitalized for a small bowel obstruction, which resolved without surgery after being placed on a liquid diet. During this hospitalization, a nodule was found in his right lung, but a follow-up test showed that the nodule had resolved.  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   Past Medical History:  Diagnosis Date   AK (actinic keratosis)    Atrial fibrillation, persistent (HCC)    chronic coumadin  therapy   Bifascicular block    Blindness    left eye   CKD (chronic kidney disease), stage III (HCC)    Coronary artery disease    20% RCA by 2014 cath   Elevated PSA    GERD (gastroesophageal reflux disease)    H/O hiatal hernia    Heart murmur    History of kidney stones    Hydrocele    left   Hyperlipidemia    Hypertension    Internal hemorrhoids    Kidney stones    kidney stones   Macular degeneration    right eye   Mitral regurgitation 02/25/2011   Recurrent MR s/p mitral valve repair then bioprosthetic MVR 2014   OA (osteoarthritis)  of neck    S/P aortic valve replacement with bioprosthetic valve 08/15/2013   23mm Edwards Magna Ease bovine pericardial tissue valve   S/P mitral valve repair 09/15/2004   Complex valvuloplasty including artificial Goretex neocord placement x4 and 36mm SARP ring annuloplasty via right mini thoracotomy approach - Dr Alford @ [facility]   S/P redo mitral valve replacement and aortic valve replacement with bioprosthetic valves 08/15/2013   27mm Edwards Magna Mitral bovine pericardial tissue valve   Severe aortic stenosis 11/14   tissue AVR    Vocal cord paralysis     Past Surgical History:  Procedure Laterality Date   AORTIC VALVE REPLACEMENT N/A 08/15/2013   Procedure: AORTIC VALVE REPLACEMENT (AVR);  Surgeon: Sudie VEAR Laine, MD;  Location: [facility] OR;  Service: Open Heart Surgery;  Laterality: N/A;   CATARACT EXTRACTION Bilateral 2011   Dr. Camillo   ESOPHAGOGASTRODUODENOSCOPY N/A 08/02/2013   Procedure: ESOPHAGOGASTRODUODENOSCOPY (EGD);  Surgeon: Gordy Reek, MD;  Location: [facility] ENDOSCOPY;  Service: Cardiovascular;  Laterality: N/A;   EYE SURGERY Right    infection   GROIN MASS OPEN BIOPSY  2012   HEMORRHOID SURGERY     pt denies.   INGUINAL HERNIA REPAIR  08/2011   INTRAOPERATIVE TRANSESOPHAGEAL ECHOCARDIOGRAM N/A 08/15/2013   Procedure: INTRAOPERATIVE TRANSESOPHAGEAL ECHOCARDIOGRAM;  Surgeon: Sudie VEAR Laine, MD;  Location: [facility] OR;  Service: Open Heart Surgery;  Laterality: N/A;   LEFT AND RIGHT HEART CATHETERIZATION WITH CORONARY ANGIOGRAM N/A 07/05/2013   Procedure: LEFT AND RIGHT HEART CATHETERIZATION WITH CORONARY ANGIOGRAM;  Surgeon: Candyce GORMAN Reek, MD;  Location: [facility] CATH LAB;  Service: Cardiovascular;  Laterality: N/A;   MASS EXCISION Right 03/30/2018   Procedure: EXCISION RIGHT FLANK  MASS;  Surgeon: Vernetta Berg, MD;  Location[facility] OR;  Service: General;  Laterality: Right;   MITRAL VALVE REPAIR  09/16/2011   @ [facility], Dr Alford   MITRAL VALVE REPAIR N/A 08/15/2013   Procedure: REDO  MITRAL VALVE (MV) REPLACEMENT;  Surgeon: Sudie VEAR Laine, MD;  Location: [facility] OR;  Service: Open Heart Surgery;  Laterality: N/A;   TEE WITHOUT CARDIOVERSION N/A 07/05/2013   Procedure: TRANSESOPHAGEAL ECHOCARDIOGRAM (TEE);  Surgeon: Gordy Reek, MD;  Location: [facility] ENDOSCOPY;  Service: Cardiovascular;  Laterality: N/A;   TEE WITHOUT CARDIOVERSION N/A 08/02/2013   Procedure: TRANSESOPHAGEAL ECHOCARDIOGRAM (TEE);  Surgeon: Gordy Reek, MD;  Location: [facility] ENDOSCOPY;  Service: Cardiovascular;  Laterality: N/A;   UMBILICAL HERNIA REPAIR N/A 03/30/2018   Procedure: HERNIA REPAIR UMBILICAL;  Surgeon: Vernetta Berg, MD;  Location: [facility] OR;  Service: General;  Laterality: N/A;    Family History  Problem Relation Age of Onset   Cancer Father        colon   Hypertension Father    Diabetes Father    Hyperlipidemia Father    Stroke Father    Hypertension Mother    Diabetes Mother    Hyperlipidemia Mother    Stroke Mother    Heart attack Mother    Hypertension Brother        MI, S/P MVR    Social History   Socioeconomic History   Marital status: Widowed    Spouse name: Not on file   Number of children: 0   Years of education: Not on file   Highest education level: Not on file  Occupational History   Occupation: retired  Tobacco Use   Smoking status: Former    Current packs/day: 0.00    Types: Cigarettes    Start date: 10/12/1970    Quit date: 10/12/1978    Years since quitting: 45.8   Smokeless tobacco: Never  Vaping Use   Vaping status: Never Used  Substance and Sexual Activity   Alcohol use: No   Drug use: No   Sexual activity: Not on file  Other Topics Concern   Not on file  Social History Narrative   Not on file   Social Drivers of Health   Financial Resource Strain: Not on file  Food Insecurity: No Food Insecurity (05/29/2024)   Hunger Vital Sign    Worried About Running Out of Food in the Last Year: Never true    Ran Out of Food in the Last Year: Never true   Transportation Needs: No Transportation Needs (05/29/2024)   PRAPARE - Administrator, Civil Service (Medical): No    Lack of Transportation (Non-Medical): No  Physical Activity: Not on file  Stress: Not on file  Social Connections: Not on file  Intimate Partner Violence:  Not At Risk (05/29/2024)   Humiliation, Afraid, Rape, and Kick questionnaire    Fear of Current or Ex-Partner: No    Emotionally Abused: No    Physically Abused: No    Sexually Abused: No     Prior to Admission medications   Medication Sig Start Date End Date Taking? Authorizing Provider  acetaminophen  (TYLENOL ) 650 MG CR tablet Take 650 mg by mouth 2 (two) times daily.     [provider]  Ascorbic Acid (VITAMIN C) 1000 MG tablet Take 1,000 mg by mouth daily.    [provider]  atorvastatin  (LIPITOR) 20 MG tablet Take 20 mg by mouth at bedtime.  06/05/11   [provider]  b complex vitamins tablet Take 1 tablet by mouth daily.    [provider]  brimonidine  (ALPHAGAN ) 0.2 % ophthalmic solution Place 1 drop into both eyes 2 (two) times daily. 02/11/23   [provider]  diltiazem  (CARDIZEM  CD) 120 MG 24 hr capsule TAKE 1 CAPSULE BY MOUTH EVERY DAY 03/09/23   Dann Candyce RAMAN, MD  furosemide  (LASIX ) 40 MG tablet 40 mg in the am and 20 mg in the pm 06/19/14   Dann Candyce RAMAN, MD  lisinopril  (ZESTRIL ) 10 MG tablet Take 10 mg by mouth daily. 08/14/19   [provider]  metoprolol  succinate (TOPROL -XL) 25 MG 24 hr tablet TAKE 1/2 TABLETS BY MOUTH IN THE MORNING AND AT BEDTIME. 03/16/23   Dann Candyce RAMAN, MD  Multiple Vitamins-Minerals (PRESERVISION AREDS PO) Take 1 tablet by mouth 2 (two) times daily.     [provider]  warfarin (COUMADIN ) 5 MG tablet TAKE AS DIRECTED BY COUMADIN  CLINIC 07/15/16   Dann Candyce RAMAN, MD    Allergies  Allergen Reactions   Penicillins Rash    REVIEW OF SYSTEMS:  General: no fevers/chills/night  sweats Eyes: no blurry vision, diplopia, or amaurosis ENT: no sore throat or hearing loss Resp: no cough, wheezing, or hemoptysis CV: no edema or palpitations GI: no abdominal pain, nausea, vomiting, diarrhea, or constipation GU: no dysuria, frequency, or hematuria Skin: no rash Neuro: no headache, numbness, tingling, or weakness of extremities Musculoskeletal: no joint pain or swelling Heme: no bleeding, DVT, or easy bruising Endo: no polydipsia or polyuria  BP (!) 150/82   Pulse 61   Ht 5' 8 (1.727 m)   Wt 149 lb 3.2 oz (67.7 kg)   SpO2 98%   BMI 22.69 kg/m  Repeat BP on my measure 135/75 PHYSICAL EXAM: GEN:  AO x 3 in no acute distress HEENT: normal Dentition: Normal Neck: JVP normal. +2 carotid upstrokes without bruits. No thyromegaly. Lungs: equal expansion, clear bilaterally CV: Apex is discrete and nondisplaced, RRR with 2/6 SEM Abd: soft, non-tender, non-distended; no bruit; positive bowel sounds Ext: no edema, ecchymoses, or cyanosis Vascular: 2+ femoral pulses, 2+ radial pulses       Skin: warm and dry without rash Neuro: CN II-XII grossly intact; motor and sensory grossly intact    DATA AND STUDIES:  EKG: Atrial fibrillation with a right bundle branch block and left anterior fascicular block  2D ECHO: June 2024   1. Left ventricular ejection fraction, by estimation, is 55 to 60%. The left ventricle has normal function. The left ventricle has no regional wall motion abnormalities. Left ventricular diastolic parameters are indeterminate.  2. Right ventricular systolic function is moderately reduced. The right ventricular size is moderately enlarged. There is moderately elevated pulmonary artery systolic pressure. The estimated right ventricular systolic  pressure is 54.0 mmHg.  3. Left atrial size was severely dilated.  4. Right atrial size was severely dilated.  5. The MV bioprosthesis has thickened leaflets. Gradients and calcualted MVA c/w moderate to  severe prosthetic MV stenosis. mean MVG . MVA 1.1 cm2. The mitral valve has been repaired/replaced. No evidence of mitral valve regurgitation. Moderate to  severe mitral stenosis. The mean mitral valve gradient is 10.7 mmHg.  6. Tricuspid valve regurgitation is mild to moderate.  7. The aortic valve has been repaired/replaced. There is mild calcification of the aortic valve. Aortic valve regurgitation is not visualized. No aortic stenosis is present. Echo findings are consistent with normal structure and function of the aortic valve prosthesis. Aortic valve area, by VTI measures 1.24 cm. Aortic valve mean gradient measures 13.8 mmHg. Aortic valve Vmax measures 2.58 m/s.  8. Aortic dilatation noted. There is mild dilatation of the aortic root, measuring 41 mm.  9. The inferior vena cava is dilated in size with <50% respiratory variability, suggesting right atrial pressure of 15 mmHg.    CARDIAC CATH: n/a  STS RISK CALCULATOR: pending  NHYA CLASS: 1    ASSESSMENT AND PLAN:   Mitral valve stenosis, unspecified etiology - Plan: ECHOCARDIOGRAM COMPLETE  S/P AVR (aortic valve replacement) - Plan: ECHOCARDIOGRAM COMPLETE  Atrial fibrillation [I48.91]  Secondary hypercoagulable state  Essential hypertension  Hyperlipidemia LDL goal <70  Aortic atherosclerosis  Stage 3a chronic kidney disease (HCC)  MS:  Remains asymptomatic, will continue to monitor.  Follow-up echocardiogram read.  I will see him back in 6 months with another echocardiogram. S/p AVR: Moderate bioprosthetic structural degeneration with mean gradient of 20 mmHg on most recent echocardiogram.  Will review final read of this echocardiogram. AF:  Continue Coumadin  per INR, diltiazem  120mg  qday, Toprol  12.5mg  qday. Secondary hypercoagulable state:  Continue Coumadin  per INR; followed by coumadin  clinic. Hypertension:  Continue Toprol  12.5 mg daily, lisinopril  10mg  daily.  BP fairly well-controlled today.   Repeat BP on my measure 135/75 HL:  Continue atorvastatin  20mg ; followed; LDL in August was 44.  LP(a) will be deferred given advanced age.  Intensive lipid-lowering is probably not required in this patient. Aortic atherosclerosis:  Continue Coumadin , atorvastatin  20mg  daily, and strict BP control. CKD IIIa:  Continue lisinopril  10mg  daily, consider SGLT2i.  I spent 35 minutes reviewing all clinical data during and prior to this visit including all relevant imaging studies, laboratories, clinical information from other health systems and prior notes from both Cardiology and other specialties, interviewing the patient, conducting a complete physical examination, and coordinating care in order to formulate a comprehensive and personalized evaluation and treatment plan.   Armstead Heiland K Sabryna Lahm, MD  07/25/2024 11:13 AM    [facility] [address] [place], [place], [place]  [address] Phone: [phone]; Fax: [phone]

## 2024-07-20 DIAGNOSIS — H401132 Primary open-angle glaucoma, bilateral, moderate stage: Secondary | ICD-10-CM | POA: Diagnosis not present

## 2024-07-20 DIAGNOSIS — H35371 Puckering of macula, right eye: Secondary | ICD-10-CM | POA: Diagnosis not present

## 2024-07-20 DIAGNOSIS — H353222 Exudative age-related macular degeneration, left eye, with inactive choroidal neovascularization: Secondary | ICD-10-CM | POA: Diagnosis not present

## 2024-07-20 DIAGNOSIS — H4051X0 Glaucoma secondary to other eye disorders, right eye, stage unspecified: Secondary | ICD-10-CM | POA: Diagnosis not present

## 2024-07-20 DIAGNOSIS — H40013 Open angle with borderline findings, low risk, bilateral: Secondary | ICD-10-CM | POA: Diagnosis not present

## 2024-07-20 DIAGNOSIS — H35351 Cystoid macular degeneration, right eye: Secondary | ICD-10-CM | POA: Diagnosis not present

## 2024-07-20 DIAGNOSIS — H18423 Band keratopathy, bilateral: Secondary | ICD-10-CM | POA: Diagnosis not present

## 2024-07-20 DIAGNOSIS — H353113 Nonexudative age-related macular degeneration, right eye, advanced atrophic without subfoveal involvement: Secondary | ICD-10-CM | POA: Diagnosis not present

## 2024-07-20 DIAGNOSIS — H353211 Exudative age-related macular degeneration, right eye, with active choroidal neovascularization: Secondary | ICD-10-CM | POA: Diagnosis not present

## 2024-07-25 ENCOUNTER — Ambulatory Visit: Payer: Self-pay | Admitting: Internal Medicine

## 2024-07-25 ENCOUNTER — Ambulatory Visit (INDEPENDENT_AMBULATORY_CARE_PROVIDER_SITE_OTHER): Admitting: Internal Medicine

## 2024-07-25 ENCOUNTER — Encounter: Payer: Self-pay | Admitting: Internal Medicine

## 2024-07-25 ENCOUNTER — Ambulatory Visit
Admission: RE | Admit: 2024-07-25 | Discharge: 2024-07-25 | Disposition: A | Discharge status: Home / Self Care | Source: Ambulatory Visit | Attending: Internal Medicine | Admitting: Internal Medicine

## 2024-07-25 VITALS — BP 150/82 | HR 61 | Ht 68.0 in | Wt 149.2 lb

## 2024-07-25 DIAGNOSIS — Z952 Presence of prosthetic heart valve: Secondary | ICD-10-CM | POA: Diagnosis not present

## 2024-07-25 DIAGNOSIS — D6869 Other thrombophilia: Secondary | ICD-10-CM

## 2024-07-25 DIAGNOSIS — I050 Rheumatic mitral stenosis: Secondary | ICD-10-CM | POA: Insufficient documentation

## 2024-07-25 DIAGNOSIS — E785 Hyperlipidemia, unspecified: Secondary | ICD-10-CM

## 2024-07-25 DIAGNOSIS — N1831 Chronic kidney disease, stage 3a: Secondary | ICD-10-CM | POA: Insufficient documentation

## 2024-07-25 DIAGNOSIS — I349 Nonrheumatic mitral valve disorder, unspecified: Secondary | ICD-10-CM

## 2024-07-25 DIAGNOSIS — I4821 Permanent atrial fibrillation: Secondary | ICD-10-CM | POA: Insufficient documentation

## 2024-07-25 DIAGNOSIS — I700 Atherosclerosis of aorta: Secondary | ICD-10-CM | POA: Diagnosis not present

## 2024-07-25 DIAGNOSIS — I10 Essential (primary) hypertension: Secondary | ICD-10-CM | POA: Insufficient documentation

## 2024-07-25 LAB — ECHOCARDIOGRAM COMPLETE
AR max vel: 1.01 cm2
AV Area VTI: 1.14 cm2
AV Area mean vel: 1.03 cm2
AV Mean grad: 19.5 mmHg
AV Peak grad: 34.9 mmHg
Ao pk vel: 2.96 m/s
Area-P 1/2: 1.05 cm2
MV VTI: 0.84 cm2
P 1/2 time: 543 ms
S' Lateral: 3.3 cm

## 2024-07-25 NOTE — Patient Instructions (Signed)
 Medication Instructions:  No medication changes were made at this visit. Continue current regimen.   *If you need a refill on your cardiac medications before your next appointment, please call your pharmacy*  Lab Work: None ordered today. If you have labs (blood work) drawn today and your tests are completely normal, you will receive your results only by: MyChart Message (if you have MyChart) OR A paper copy in the mail If you have any lab test that is abnormal or we need to change your treatment, we will call you to review the results.  Testing/Procedures: Your physician has requested that you have an echocardiogram prior to 6 month follow-up with Dr. Wendel. Echocardiography is a painless test that uses sound waves to create images of your heart. It provides your doctor with information about the size and shape of your heart and how well your heart's chambers and valves are working. This procedure takes approximately one hour. There are no restrictions for this procedure. Please do NOT wear cologne, perfume, aftershave, or lotions (deodorant is allowed). Please arrive 15 minutes prior to your appointment time.  Please note: We ask at that you not bring children with you during ultrasound (echo/ vascular) testing. Due to room size and safety concerns, children are not allowed in the ultrasound rooms during exams. Our front office staff cannot provide observation of children in our lobby area while testing is being conducted. An adult accompanying a patient to their appointment will only be allowed in the ultrasound room at the discretion of the ultrasound technician under special circumstances. We apologize for any inconvenience.   Follow-Up: At [facility], you and your health needs are our priority.  As part of our continuing mission to provide you with exceptional heart care, our providers are all part of one team.  This team includes your primary Cardiologist (physician) and  Advanced Practice Providers or APPs (Physician Assistants and Nurse Practitioners) who all work together to provide you with the care you need, when you need it.  Your next appointment:   6 month(s)  Provider:   Arun Thukkani, MD

## 2024-07-26 DIAGNOSIS — Z23 Encounter for immunization: Secondary | ICD-10-CM | POA: Diagnosis not present

## 2024-08-08 ENCOUNTER — Ambulatory Visit: Attending: Interventional Cardiology | Admitting: *Deleted

## 2024-08-08 DIAGNOSIS — Z5181 Encounter for therapeutic drug level monitoring: Secondary | ICD-10-CM | POA: Insufficient documentation

## 2024-08-08 DIAGNOSIS — Z953 Presence of xenogenic heart valve: Secondary | ICD-10-CM | POA: Insufficient documentation

## 2024-08-08 DIAGNOSIS — Z952 Presence of prosthetic heart valve: Secondary | ICD-10-CM | POA: Insufficient documentation

## 2024-08-08 DIAGNOSIS — I4821 Permanent atrial fibrillation: Secondary | ICD-10-CM | POA: Insufficient documentation

## 2024-08-08 LAB — POCT INR: INR: 2.3 (ref 2.0–3.0)

## 2024-08-08 NOTE — Patient Instructions (Signed)
 Description   INR-2.3; Continue taking warfarin 1 tablet daily except 1.5 tablets on Tuesdays and Saturdays.  Recheck INR in 4 weeks.  Coumadin  Clinic [phone]  Fax #[phone]

## 2024-08-08 NOTE — Progress Notes (Signed)
 Description   INR-2.3; Continue taking warfarin 1 tablet daily except 1.5 tablets on Tuesdays and Saturdays.  Recheck INR in 4 weeks.  Coumadin  Clinic [phone]  Fax #[phone]

## 2024-08-11 DIAGNOSIS — I509 Heart failure, unspecified: Secondary | ICD-10-CM | POA: Diagnosis not present

## 2024-08-11 DIAGNOSIS — I10 Essential (primary) hypertension: Secondary | ICD-10-CM | POA: Diagnosis not present

## 2024-08-11 DIAGNOSIS — E782 Mixed hyperlipidemia: Secondary | ICD-10-CM | POA: Diagnosis not present

## 2024-08-16 DIAGNOSIS — I10 Essential (primary) hypertension: Secondary | ICD-10-CM | POA: Diagnosis not present

## 2024-08-16 DIAGNOSIS — I509 Heart failure, unspecified: Secondary | ICD-10-CM | POA: Diagnosis not present

## 2024-08-31 DIAGNOSIS — H353113 Nonexudative age-related macular degeneration, right eye, advanced atrophic without subfoveal involvement: Secondary | ICD-10-CM | POA: Diagnosis not present

## 2024-08-31 DIAGNOSIS — H4051X0 Glaucoma secondary to other eye disorders, right eye, stage unspecified: Secondary | ICD-10-CM | POA: Diagnosis not present

## 2024-08-31 DIAGNOSIS — H35351 Cystoid macular degeneration, right eye: Secondary | ICD-10-CM | POA: Diagnosis not present

## 2024-08-31 DIAGNOSIS — H40013 Open angle with borderline findings, low risk, bilateral: Secondary | ICD-10-CM | POA: Diagnosis not present

## 2024-08-31 DIAGNOSIS — H18423 Band keratopathy, bilateral: Secondary | ICD-10-CM | POA: Diagnosis not present

## 2024-08-31 DIAGNOSIS — H35371 Puckering of macula, right eye: Secondary | ICD-10-CM | POA: Diagnosis not present

## 2024-08-31 DIAGNOSIS — H353222 Exudative age-related macular degeneration, left eye, with inactive choroidal neovascularization: Secondary | ICD-10-CM | POA: Diagnosis not present

## 2024-08-31 DIAGNOSIS — H401132 Primary open-angle glaucoma, bilateral, moderate stage: Secondary | ICD-10-CM | POA: Diagnosis not present

## 2024-08-31 DIAGNOSIS — H353211 Exudative age-related macular degeneration, right eye, with active choroidal neovascularization: Secondary | ICD-10-CM | POA: Diagnosis not present

## 2024-09-05 ENCOUNTER — Ambulatory Visit: Attending: Interventional Cardiology | Admitting: *Deleted

## 2024-09-05 DIAGNOSIS — Z952 Presence of prosthetic heart valve: Secondary | ICD-10-CM | POA: Diagnosis not present

## 2024-09-05 DIAGNOSIS — Z5181 Encounter for therapeutic drug level monitoring: Secondary | ICD-10-CM | POA: Insufficient documentation

## 2024-09-05 DIAGNOSIS — I4821 Permanent atrial fibrillation: Secondary | ICD-10-CM | POA: Diagnosis not present

## 2024-09-05 LAB — POCT INR: POC INR: 2.1

## 2024-09-05 NOTE — Progress Notes (Signed)
 Lab Results  Component Value Date   INR 2.1 09/05/2024   INR 2.3 08/08/2024   INR 2.3 07/18/2024    Description   INR-2.1; Continue taking warfarin 1 tablet daily except 1.5 tablets on Tuesdays and Saturdays.  Recheck INR in 6 weeks.  Coumadin  Clinic [phone]  Fax #[phone]

## 2024-09-05 NOTE — Patient Instructions (Addendum)
 Description   INR-2.1; Continue taking warfarin 1 tablet daily except 1.5 tablets on Tuesdays and Saturdays.  Recheck INR in 6 weeks.  Coumadin  Clinic [phone]  Fax #[phone]

## 2024-09-10 DIAGNOSIS — I10 Essential (primary) hypertension: Secondary | ICD-10-CM | POA: Diagnosis not present

## 2024-09-10 DIAGNOSIS — I509 Heart failure, unspecified: Secondary | ICD-10-CM | POA: Diagnosis not present

## 2024-09-10 DIAGNOSIS — E782 Mixed hyperlipidemia: Secondary | ICD-10-CM | POA: Diagnosis not present

## 2024-09-11 ENCOUNTER — Other Ambulatory Visit (HOSPITAL_COMMUNITY): Payer: Self-pay

## 2024-10-17 ENCOUNTER — Ambulatory Visit: Admitting: *Deleted

## 2024-10-17 DIAGNOSIS — I4821 Permanent atrial fibrillation: Secondary | ICD-10-CM | POA: Diagnosis present

## 2024-10-17 DIAGNOSIS — Z953 Presence of xenogenic heart valve: Secondary | ICD-10-CM | POA: Diagnosis present

## 2024-10-17 DIAGNOSIS — Z5181 Encounter for therapeutic drug level monitoring: Secondary | ICD-10-CM | POA: Insufficient documentation

## 2024-10-17 DIAGNOSIS — Z952 Presence of prosthetic heart valve: Secondary | ICD-10-CM | POA: Insufficient documentation

## 2024-10-17 LAB — POCT INR: INR: 2.5 (ref 2.0–3.0)

## 2024-10-17 NOTE — Progress Notes (Signed)
 Description   INR-2.5; Continue taking warfarin 1 tablet daily except 1.5 tablets on Tuesdays and Saturdays.  Recheck INR in 6 weeks.  Coumadin  Clinic [phone]  Fax #[phone]

## 2024-10-17 NOTE — Patient Instructions (Signed)
 Description   INR-2.5; Continue taking warfarin 1 tablet daily except 1.5 tablets on Tuesdays and Saturdays.  Recheck INR in 6 weeks.  Coumadin  Clinic [phone]  Fax #[phone]

## 2024-11-07 ENCOUNTER — Other Ambulatory Visit: Payer: Self-pay | Admitting: Surgery

## 2024-11-28 ENCOUNTER — Ambulatory Visit

## 2025-01-23 ENCOUNTER — Ambulatory Visit (HOSPITAL_COMMUNITY)

## 2025-02-14 ENCOUNTER — Ambulatory Visit: Admitting: Internal Medicine
